# Patient Record
Sex: Female | Born: 1945 | Race: White | Hispanic: No | Marital: Married | Smoking: Former smoker
Health system: Southern US, Community
[De-identification: ages and names within clinical notes are randomized; demographics above are authoritative.]

## PROBLEM LIST (undated history)

## (undated) DIAGNOSIS — M1120 Other chondrocalcinosis, unspecified site: Secondary | ICD-10-CM

## (undated) DIAGNOSIS — R200 Anesthesia of skin: Secondary | ICD-10-CM

## (undated) DIAGNOSIS — E871 Hypo-osmolality and hyponatremia: Secondary | ICD-10-CM

## (undated) DIAGNOSIS — M1990 Unspecified osteoarthritis, unspecified site: Secondary | ICD-10-CM

## (undated) DIAGNOSIS — E785 Hyperlipidemia, unspecified: Secondary | ICD-10-CM

## (undated) DIAGNOSIS — G4730 Sleep apnea, unspecified: Secondary | ICD-10-CM

## (undated) DIAGNOSIS — D6869 Other thrombophilia: Secondary | ICD-10-CM

## (undated) DIAGNOSIS — K219 Gastro-esophageal reflux disease without esophagitis: Secondary | ICD-10-CM

## (undated) DIAGNOSIS — I10 Essential (primary) hypertension: Secondary | ICD-10-CM

## (undated) DIAGNOSIS — E039 Hypothyroidism, unspecified: Secondary | ICD-10-CM

## (undated) DIAGNOSIS — J45909 Unspecified asthma, uncomplicated: Secondary | ICD-10-CM

## (undated) DIAGNOSIS — N189 Chronic kidney disease, unspecified: Secondary | ICD-10-CM

## (undated) DIAGNOSIS — K5790 Diverticulosis of intestine, part unspecified, without perforation or abscess without bleeding: Secondary | ICD-10-CM

## (undated) DIAGNOSIS — E669 Obesity, unspecified: Secondary | ICD-10-CM

## (undated) DIAGNOSIS — D689 Coagulation defect, unspecified: Secondary | ICD-10-CM

## (undated) DIAGNOSIS — G4733 Obstructive sleep apnea (adult) (pediatric): Secondary | ICD-10-CM

## (undated) DIAGNOSIS — I872 Venous insufficiency (chronic) (peripheral): Secondary | ICD-10-CM

## (undated) DIAGNOSIS — I728 Aneurysm of other specified arteries: Secondary | ICD-10-CM

## (undated) DIAGNOSIS — I2699 Other pulmonary embolism without acute cor pulmonale: Secondary | ICD-10-CM

## (undated) DIAGNOSIS — I480 Paroxysmal atrial fibrillation: Secondary | ICD-10-CM

## (undated) HISTORY — DX: Obstructive sleep apnea (adult) (pediatric): G47.33

## (undated) HISTORY — DX: Venous insufficiency (chronic) (peripheral): I87.2

## (undated) HISTORY — DX: Gastro-esophageal reflux disease without esophagitis: K21.9

## (undated) HISTORY — DX: Hyperlipidemia, unspecified: E78.5

## (undated) HISTORY — DX: Other pulmonary embolism without acute cor pulmonale: I26.99

## (undated) HISTORY — PX: TONSILLECTOMY: SUR1361

## (undated) HISTORY — PX: KNEE ARTHROSCOPY: SUR90

## (undated) HISTORY — DX: Unspecified asthma, uncomplicated: J45.909

## (undated) HISTORY — PX: BREAST RECONSTRUCTION: SHX9

## (undated) HISTORY — DX: Coagulation defect, unspecified: D68.9

## (undated) HISTORY — DX: Aneurysm of other specified arteries: I72.8

## (undated) HISTORY — DX: Diverticulosis of intestine, part unspecified, without perforation or abscess without bleeding: K57.90

## (undated) HISTORY — DX: Obesity, unspecified: E66.9

## (undated) HISTORY — DX: Other thrombophilia: D68.69

## (undated) HISTORY — DX: Chronic kidney disease, unspecified: N18.9

## (undated) HISTORY — PX: WISDOM TOOTH EXTRACTION: SHX21

## (undated) HISTORY — DX: Hypo-osmolality and hyponatremia: E87.1

## (undated) HISTORY — DX: Hypothyroidism, unspecified: E03.9

## (undated) HISTORY — DX: Paroxysmal atrial fibrillation: I48.0

---

## 1985-10-26 HISTORY — PX: OTHER SURGICAL HISTORY: SHX169

## 1998-04-16 ENCOUNTER — Other Ambulatory Visit: Admission: RE | Admit: 1998-04-16 | Discharge: 1998-04-16 | Payer: Self-pay | Admitting: *Deleted

## 1999-06-12 ENCOUNTER — Other Ambulatory Visit: Admission: RE | Admit: 1999-06-12 | Discharge: 1999-06-12 | Payer: Self-pay | Admitting: *Deleted

## 1999-12-08 ENCOUNTER — Ambulatory Visit (HOSPITAL_COMMUNITY): Admission: RE | Admit: 1999-12-08 | Discharge: 1999-12-08 | Payer: Self-pay | Admitting: Orthopedic Surgery

## 2000-07-01 ENCOUNTER — Other Ambulatory Visit: Admission: RE | Admit: 2000-07-01 | Discharge: 2000-07-01 | Payer: Self-pay | Admitting: *Deleted

## 2001-08-22 ENCOUNTER — Other Ambulatory Visit: Admission: RE | Admit: 2001-08-22 | Discharge: 2001-08-22 | Payer: Self-pay | Admitting: *Deleted

## 2002-06-08 ENCOUNTER — Encounter: Admission: RE | Admit: 2002-06-08 | Discharge: 2002-06-08 | Payer: Self-pay | Admitting: Dermatology

## 2002-06-08 ENCOUNTER — Encounter: Payer: Self-pay | Admitting: Dermatology

## 2002-07-26 ENCOUNTER — Encounter: Admission: RE | Admit: 2002-07-26 | Discharge: 2002-07-26 | Payer: Self-pay | Admitting: Dermatology

## 2002-07-26 ENCOUNTER — Encounter: Payer: Self-pay | Admitting: Gastroenterology

## 2002-08-16 ENCOUNTER — Other Ambulatory Visit: Admission: RE | Admit: 2002-08-16 | Discharge: 2002-08-16 | Payer: Self-pay | Admitting: *Deleted

## 2002-10-26 HISTORY — PX: COLONOSCOPY: SHX174

## 2002-11-07 ENCOUNTER — Ambulatory Visit (HOSPITAL_BASED_OUTPATIENT_CLINIC_OR_DEPARTMENT_OTHER): Admission: RE | Admit: 2002-11-07 | Discharge: 2002-11-07 | Payer: Self-pay | Admitting: Gastroenterology

## 2003-10-03 ENCOUNTER — Other Ambulatory Visit: Admission: RE | Admit: 2003-10-03 | Discharge: 2003-10-03 | Payer: Self-pay | Admitting: *Deleted

## 2004-10-07 ENCOUNTER — Other Ambulatory Visit: Admission: RE | Admit: 2004-10-07 | Discharge: 2004-10-07 | Payer: Self-pay | Admitting: *Deleted

## 2005-02-05 ENCOUNTER — Ambulatory Visit: Payer: Self-pay | Admitting: Internal Medicine

## 2005-11-17 ENCOUNTER — Other Ambulatory Visit: Admission: RE | Admit: 2005-11-17 | Discharge: 2005-11-17 | Payer: Self-pay | Admitting: *Deleted

## 2006-11-18 ENCOUNTER — Other Ambulatory Visit: Admission: RE | Admit: 2006-11-18 | Discharge: 2006-11-18 | Payer: Self-pay | Admitting: *Deleted

## 2007-02-24 ENCOUNTER — Encounter: Admission: RE | Admit: 2007-02-24 | Discharge: 2007-02-24 | Payer: Self-pay | Admitting: Gastroenterology

## 2008-03-06 ENCOUNTER — Other Ambulatory Visit: Admission: RE | Admit: 2008-03-06 | Discharge: 2008-03-06 | Payer: Self-pay | Admitting: Obstetrics and Gynecology

## 2009-06-27 ENCOUNTER — Encounter: Payer: Self-pay | Admitting: Obstetrics and Gynecology

## 2009-06-27 ENCOUNTER — Ambulatory Visit: Payer: Self-pay | Admitting: Obstetrics and Gynecology

## 2009-06-27 ENCOUNTER — Other Ambulatory Visit: Admission: RE | Admit: 2009-06-27 | Discharge: 2009-06-27 | Payer: Self-pay | Admitting: Obstetrics and Gynecology

## 2009-09-23 ENCOUNTER — Ambulatory Visit: Payer: Self-pay | Admitting: Obstetrics and Gynecology

## 2009-12-26 ENCOUNTER — Ambulatory Visit: Payer: Self-pay | Admitting: Obstetrics and Gynecology

## 2011-07-06 ENCOUNTER — Other Ambulatory Visit: Payer: Self-pay | Admitting: Internal Medicine

## 2011-07-06 DIAGNOSIS — R51 Headache: Secondary | ICD-10-CM

## 2011-07-07 ENCOUNTER — Other Ambulatory Visit: Payer: Self-pay

## 2011-07-09 ENCOUNTER — Ambulatory Visit
Admission: RE | Admit: 2011-07-09 | Discharge: 2011-07-09 | Disposition: A | Discharge status: Home / Self Care | Payer: Medicare Other | Source: Ambulatory Visit | Attending: Internal Medicine | Admitting: Internal Medicine

## 2011-07-09 DIAGNOSIS — R51 Headache: Secondary | ICD-10-CM

## 2011-07-09 MED ORDER — GADOBENATE DIMEGLUMINE 529 MG/ML IV SOLN
20.0000 mL | Freq: Once | INTRAVENOUS | Status: AC | PRN
  Administered 2011-07-09: 20 mL via INTRAVENOUS

## 2011-07-13 ENCOUNTER — Other Ambulatory Visit: Payer: Self-pay | Admitting: Internal Medicine

## 2011-07-13 ENCOUNTER — Other Ambulatory Visit: Payer: Medicare Other

## 2011-07-13 DIAGNOSIS — R51 Headache: Secondary | ICD-10-CM

## 2011-07-14 ENCOUNTER — Encounter (HOSPITAL_COMMUNITY): Payer: Self-pay

## 2011-07-14 ENCOUNTER — Ambulatory Visit (HOSPITAL_COMMUNITY)
Admission: RE | Admit: 2011-07-14 | Discharge: 2011-07-14 | Disposition: A | Discharge status: Home / Self Care | Payer: Medicare Other | Source: Ambulatory Visit | Attending: Internal Medicine | Admitting: Internal Medicine

## 2011-07-14 DIAGNOSIS — I6529 Occlusion and stenosis of unspecified carotid artery: Secondary | ICD-10-CM | POA: Insufficient documentation

## 2011-07-14 DIAGNOSIS — I729 Aneurysm of unspecified site: Secondary | ICD-10-CM | POA: Insufficient documentation

## 2011-07-14 DIAGNOSIS — R9389 Abnormal findings on diagnostic imaging of other specified body structures: Secondary | ICD-10-CM | POA: Insufficient documentation

## 2011-07-14 DIAGNOSIS — R51 Headache: Secondary | ICD-10-CM | POA: Insufficient documentation

## 2011-07-14 HISTORY — DX: Essential (primary) hypertension: I10

## 2011-07-14 MED ORDER — IOHEXOL 350 MG/ML SOLN: 50.0000 mL | Freq: Once | INTRAVENOUS | Status: AC | PRN

## 2013-06-26 DIAGNOSIS — I2699 Other pulmonary embolism without acute cor pulmonale: Secondary | ICD-10-CM

## 2013-06-26 HISTORY — DX: Other pulmonary embolism without acute cor pulmonale: I26.99

## 2013-07-14 ENCOUNTER — Inpatient Hospital Stay (HOSPITAL_COMMUNITY): Payer: Medicare Other

## 2013-07-14 ENCOUNTER — Inpatient Hospital Stay (HOSPITAL_COMMUNITY)
Admission: EM | Admit: 2013-07-14 | Discharge: 2013-07-17 | DRG: 176 | Disposition: A | Discharge status: Home / Self Care | Payer: Medicare Other | Attending: Internal Medicine | Admitting: Internal Medicine

## 2013-07-14 ENCOUNTER — Encounter (HOSPITAL_COMMUNITY): Payer: Self-pay

## 2013-07-14 ENCOUNTER — Emergency Department (HOSPITAL_COMMUNITY): Payer: Medicare Other

## 2013-07-14 DIAGNOSIS — G4730 Sleep apnea, unspecified: Secondary | ICD-10-CM | POA: Diagnosis present

## 2013-07-14 DIAGNOSIS — I82409 Acute embolism and thrombosis of unspecified deep veins of unspecified lower extremity: Secondary | ICD-10-CM | POA: Diagnosis present

## 2013-07-14 DIAGNOSIS — N390 Urinary tract infection, site not specified: Secondary | ICD-10-CM | POA: Diagnosis present

## 2013-07-14 DIAGNOSIS — M1990 Unspecified osteoarthritis, unspecified site: Secondary | ICD-10-CM | POA: Diagnosis present

## 2013-07-14 DIAGNOSIS — I10 Essential (primary) hypertension: Secondary | ICD-10-CM | POA: Diagnosis present

## 2013-07-14 DIAGNOSIS — R509 Fever, unspecified: Secondary | ICD-10-CM

## 2013-07-14 DIAGNOSIS — R0602 Shortness of breath: Secondary | ICD-10-CM | POA: Diagnosis present

## 2013-07-14 DIAGNOSIS — I2699 Other pulmonary embolism without acute cor pulmonale: Principal | ICD-10-CM | POA: Diagnosis present

## 2013-07-14 DIAGNOSIS — E669 Obesity, unspecified: Secondary | ICD-10-CM | POA: Diagnosis present

## 2013-07-14 DIAGNOSIS — I4819 Other persistent atrial fibrillation: Secondary | ICD-10-CM | POA: Diagnosis present

## 2013-07-14 DIAGNOSIS — I4891 Unspecified atrial fibrillation: Secondary | ICD-10-CM

## 2013-07-14 DIAGNOSIS — E876 Hypokalemia: Secondary | ICD-10-CM | POA: Diagnosis present

## 2013-07-14 DIAGNOSIS — E860 Dehydration: Secondary | ICD-10-CM | POA: Diagnosis present

## 2013-07-14 DIAGNOSIS — Z22322 Carrier or suspected carrier of Methicillin resistant Staphylococcus aureus: Secondary | ICD-10-CM

## 2013-07-14 DIAGNOSIS — Z6838 Body mass index (BMI) 38.0-38.9, adult: Secondary | ICD-10-CM

## 2013-07-14 DIAGNOSIS — M216X9 Other acquired deformities of unspecified foot: Secondary | ICD-10-CM | POA: Diagnosis present

## 2013-07-14 DIAGNOSIS — M25569 Pain in unspecified knee: Secondary | ICD-10-CM

## 2013-07-14 DIAGNOSIS — M25561 Pain in right knee: Secondary | ICD-10-CM | POA: Diagnosis present

## 2013-07-14 LAB — COMPREHENSIVE METABOLIC PANEL
Albumin: 3.2 g/dL — ABNORMAL LOW (ref 3.5–5.2)
Alkaline Phosphatase: 73 U/L (ref 39–117)
BUN: 32 mg/dL — ABNORMAL HIGH (ref 6–23)
CO2: 21 mEq/L (ref 19–32)
Chloride: 87 mEq/L — ABNORMAL LOW (ref 96–112)
Creatinine, Ser: 1.04 mg/dL (ref 0.50–1.10)
GFR calc Af Amer: 63 mL/min — ABNORMAL LOW (ref >90)
GFR calc non Af Amer: 54 mL/min — ABNORMAL LOW (ref >90)
Glucose, Bld: 103 mg/dL — ABNORMAL HIGH (ref 70–99)
Potassium: 4.9 mEq/L (ref 3.5–5.1)
Total Bilirubin: 1.1 mg/dL (ref 0.3–1.2)

## 2013-07-14 LAB — MRSA PCR SCREENING: MRSA by PCR: POSITIVE — AB

## 2013-07-14 LAB — SYNOVIAL CELL COUNT + DIFF, W/ CRYSTALS
Eosinophils-Synovial: 0 % (ref 0–1)
Monocyte-Macrophage-Synovial Fluid: 12 % — ABNORMAL LOW (ref 50–90)
Neutrophil, Synovial: 84 % — ABNORMAL HIGH (ref 0–25)
Other Cells-SYN: 0
WBC, Synovial: 16650 /mm3 — ABNORMAL HIGH (ref 0–200)

## 2013-07-14 LAB — GRAM STAIN

## 2013-07-14 LAB — URINALYSIS, ROUTINE W REFLEX MICROSCOPIC
Glucose, UA: NEGATIVE mg/dL
Ketones, ur: NEGATIVE mg/dL
Protein, ur: NEGATIVE mg/dL
Urobilinogen, UA: 1 mg/dL (ref 0.0–1.0)

## 2013-07-14 LAB — PRO B NATRIURETIC PEPTIDE: Pro B Natriuretic peptide (BNP): 974.9 pg/mL — ABNORMAL HIGH (ref 0–125)

## 2013-07-14 LAB — D-DIMER, QUANTITATIVE: D-Dimer, Quant: 3.77 ug/mL-FEU — ABNORMAL HIGH (ref 0.00–0.48)

## 2013-07-14 LAB — POCT I-STAT, CHEM 8
BUN: 46 mg/dL — ABNORMAL HIGH (ref 6–23)
Calcium, Ion: 1 mmol/L — ABNORMAL LOW (ref 1.13–1.30)
Creatinine, Ser: 1.3 mg/dL — ABNORMAL HIGH (ref 0.50–1.10)
Hemoglobin: 12.9 g/dL (ref 12.0–15.0)
TCO2: 25 mmol/L (ref 0–100)

## 2013-07-14 LAB — POCT I-STAT TROPONIN I
Troponin i, poc: 0 ng/mL (ref 0.00–0.08)
Troponin i, poc: 0.01 ng/mL (ref 0.00–0.08)

## 2013-07-14 LAB — URINE MICROSCOPIC-ADD ON

## 2013-07-14 LAB — CBC
HCT: 35.3 % — ABNORMAL LOW (ref 36.0–46.0)
Hemoglobin: 13 g/dL (ref 12.0–15.0)
MCV: 90.3 fL (ref 78.0–100.0)
RBC: 3.91 MIL/uL (ref 3.87–5.11)
WBC: 14.8 10*3/uL — ABNORMAL HIGH (ref 4.0–10.5)

## 2013-07-14 MED ORDER — INFLUENZA VAC SPLIT QUAD 0.5 ML IM SUSP
0.5000 mL | INTRAMUSCULAR | Status: AC
  Administered 2013-07-15: 0.5 mL via INTRAMUSCULAR
  Filled 2013-07-14: qty 0.5

## 2013-07-14 MED ORDER — ACETAMINOPHEN 325 MG PO TABS: 650.0000 mg | ORAL_TABLET | Freq: Four times a day (QID) | ORAL | Status: DC | PRN

## 2013-07-14 MED ORDER — PIPERACILLIN-TAZOBACTAM 3.375 G IVPB
3.3750 g | Freq: Three times a day (TID) | INTRAVENOUS | Status: DC
  Administered 2013-07-14 – 2013-07-15 (×2): 3.375 g via INTRAVENOUS
  Filled 2013-07-14 (×5): qty 50

## 2013-07-14 MED ORDER — LIDOCAINE HCL (PF) 2 % IJ SOLN: 2.0000 mL | Freq: Once | INTRAMUSCULAR | Status: DC

## 2013-07-14 MED ORDER — SODIUM CHLORIDE 0.9 % IV BOLUS (SEPSIS)
1000.0000 mL | Freq: Once | INTRAVENOUS | Status: AC
  Administered 2013-07-14: 1000 mL via INTRAVENOUS

## 2013-07-14 MED ORDER — ACETAMINOPHEN 650 MG RE SUPP: 650.0000 mg | Freq: Four times a day (QID) | RECTAL | Status: DC | PRN

## 2013-07-14 MED ORDER — CLONIDINE HCL 0.3 MG PO TABS
0.3000 mg | ORAL_TABLET | Freq: Two times a day (BID) | ORAL | Status: DC
  Administered 2013-07-14 – 2013-07-17 (×6): 0.3 mg via ORAL
  Filled 2013-07-14 (×7): qty 1

## 2013-07-14 MED ORDER — HEPARIN (PORCINE) IN NACL 100-0.45 UNIT/ML-% IJ SOLN
1500.0000 [IU]/h | INTRAMUSCULAR | Status: DC
  Administered 2013-07-14: 1200 [IU]/h via INTRAVENOUS
  Filled 2013-07-14 (×2): qty 250

## 2013-07-14 MED ORDER — ONDANSETRON HCL 4 MG PO TABS: 4.0000 mg | ORAL_TABLET | Freq: Four times a day (QID) | ORAL | Status: DC | PRN

## 2013-07-14 MED ORDER — OXYCODONE HCL 5 MG PO TABS
5.0000 mg | ORAL_TABLET | ORAL | Status: DC | PRN
  Administered 2013-07-14 – 2013-07-17 (×5): 5 mg via ORAL
  Filled 2013-07-14 (×5): qty 1

## 2013-07-14 MED ORDER — LEVOTHYROXINE SODIUM 125 MCG PO TABS
125.0000 ug | ORAL_TABLET | Freq: Every day | ORAL | Status: DC
  Administered 2013-07-15 – 2013-07-17 (×3): 125 ug via ORAL
  Filled 2013-07-14 (×4): qty 1

## 2013-07-14 MED ORDER — HYDROCHLOROTHIAZIDE 12.5 MG PO CAPS
12.5000 mg | ORAL_CAPSULE | Freq: Every day | ORAL | Status: DC
  Administered 2013-07-15: 12.5 mg via ORAL
  Filled 2013-07-14: qty 1

## 2013-07-14 MED ORDER — HEPARIN BOLUS VIA INFUSION
3000.0000 [IU] | Freq: Once | INTRAVENOUS | Status: AC
  Administered 2013-07-14: 3000 [IU] via INTRAVENOUS
  Filled 2013-07-14: qty 3000

## 2013-07-14 MED ORDER — IOHEXOL 350 MG/ML SOLN
100.0000 mL | Freq: Once | INTRAVENOUS | Status: AC | PRN
  Administered 2013-07-14: 100 mL via INTRAVENOUS

## 2013-07-14 MED ORDER — DILTIAZEM HCL 100 MG IV SOLR
5.0000 mg/h | INTRAVENOUS | Status: DC
  Administered 2013-07-14: 5 mg/h via INTRAVENOUS
  Filled 2013-07-14: qty 100

## 2013-07-14 MED ORDER — ALUM & MAG HYDROXIDE-SIMETH 200-200-20 MG/5ML PO SUSP: 30.0000 mL | Freq: Four times a day (QID) | ORAL | Status: DC | PRN

## 2013-07-14 MED ORDER — VANCOMYCIN HCL IN DEXTROSE 750-5 MG/150ML-% IV SOLN
750.0000 mg | Freq: Two times a day (BID) | INTRAVENOUS | Status: DC
  Administered 2013-07-14 – 2013-07-15 (×2): 750 mg via INTRAVENOUS
  Filled 2013-07-14 (×3): qty 150

## 2013-07-14 MED ORDER — DILTIAZEM HCL 100 MG IV SOLR
5.0000 mg/h | Freq: Once | INTRAVENOUS | Status: AC
  Administered 2013-07-14: 5 mg/h via INTRAVENOUS

## 2013-07-14 MED ORDER — SODIUM CHLORIDE 0.9 % IJ SOLN
3.0000 mL | Freq: Two times a day (BID) | INTRAMUSCULAR | Status: DC
  Administered 2013-07-15 – 2013-07-17 (×5): 3 mL via INTRAVENOUS

## 2013-07-14 MED ORDER — MORPHINE SULFATE 2 MG/ML IJ SOLN: 1.0000 mg | INTRAMUSCULAR | Status: DC | PRN

## 2013-07-14 MED ORDER — SODIUM CHLORIDE 0.9 % IV SOLN
INTRAVENOUS | Status: DC
  Administered 2013-07-14 – 2013-07-16 (×2): via INTRAVENOUS

## 2013-07-14 MED ORDER — ONDANSETRON HCL 4 MG/2ML IJ SOLN: 4.0000 mg | Freq: Four times a day (QID) | INTRAMUSCULAR | Status: DC | PRN

## 2013-07-14 NOTE — Consult Note (Addendum)
Admit date: 07/14/2013 Referring Physician  ER MD Primary Physician  Clent Ridges, MD, Deboraha Sprang at [facility] Cardiologist  Armanda Magic, MD Reason for Consultation  A fib with RVR  ASSESSMENT: 1. New onset Atrial fibrillation with RVR (prior w/u 2012 by Dr. Mayford Knife for palpitations was negative) 2. Acute onset dyspnea, fever and chills. R/O PE and infection 3. Obesity 4. Sleep apnea 5. Hypertension 6. Osteoarthritis  PLAN:  1. IV diltiazem as you are doing 2. 2 D doppler echo 3. Agree with IV heparin for A fib and possibility of PE   HPI: The patient is admitted with fever, chills, and A fib with RVR. Started feeling poorly 3-4 days ago. Associated with lower extremity swelling. No chest pain and no prior history of heart condition. Did have a negative w/u for palpitations in 2012 by Dr. Kym Groom.   PMH:   Past Medical History  Diagnosis Date  . Hypertension      PSH:  History reviewed. No pertinent past surgical history.  Allergies:  Nsaids and Codeine Prior to Admit Meds:   Prescriptions prior to admission  Medication Sig Dispense Refill  . acetaminophen (TYLENOL) 500 MG tablet Take 1,000 mg by mouth 2 (two) times daily as needed for pain.       . cloNIDine (CATAPRES) 0.3 MG tablet Take 0.3 mg by mouth 2 (two) times daily.      . enalapril (VASOTEC) 20 MG tablet Take 20 mg by mouth daily.      . hydrochlorothiazide (MICROZIDE) 12.5 MG capsule Take 12.5 mg by mouth daily.      [name] HYDROcodone-acetaminophen (NORCO/VICODIN) 5-325 MG per tablet Take 1 tablet by mouth every 6 (six) hours as needed for pain.      [name] levothyroxine (SYNTHROID, LEVOTHROID) 125 MCG tablet Take 125 mcg by mouth daily before breakfast.      . Nutritional Supplements (JUICE PLUS FIBRE PO) Take 3 tablets by mouth daily. Takes 1 tablet of fruits, vegetables, and berry daily.      [name] OVER THE COUNTER MEDICATION Apply 1 application topically daily as needed ("essential oils" dark blue label-for pain).       [name]  OVER THE COUNTER MEDICATION Take 3 tablets by mouth daily. "Doterra OnGuard capsules" herbal medication      . tetrahydrozoline 0.05 % ophthalmic solution Place 2 drops into both eyes daily as needed (for dry eyes).      . traMADol (ULTRAM) 50 MG tablet Take 50 mg by mouth every 6 (six) hours as needed for pain.       Fam HX:   History reviewed. No pertinent family history. Social HX:    History   Social History  . Marital Status: Married    Spouse Name: N/A    Number of Children: N/A  . Years of Education: N/A   Occupational History  . Not on file.   Social History Main Topics  . Smoking status: Not on file  . Smokeless tobacco: Not on file  . Alcohol Use: Not on file  . Drug Use: Not on file  . Sexual Activity: Not on file   Other Topics Concern  . Not on file   Social History Narrative  . No narrative on file     Review of Systems: No new complaints other than knee pain  Physical Exam: Blood pressure 103/49, pulse 99, temperature 98.5 F (36.9 C), temperature source Oral, resp. rate 22, SpO2 100.00%. Weight change:    Chest is clear Cardiac  exam reveals an S4 gallop.(she is back is rhythm) No edema Neuro is unremarkable. Labs:   Lab Results  Component Value Date   WBC 14.8* 07/14/2013   HGB 12.9 07/14/2013   HCT 38.0 07/14/2013   MCV 90.3 07/14/2013   PLT 268 07/14/2013    Recent Labs Lab 07/14/13 1425 07/14/13 1445  NA 126* 130*  K 4.9 4.9  CL 87* 97  CO2 21  --   BUN 32* 46*  CREATININE 1.04 1.30*  CALCIUM 9.5  --   PROT 8.0  --   BILITOT 1.1  --   ALKPHOS 73  --   ALT 21  --   AST 48*  --   GLUCOSE 103* 110*     Radiology:  Dg Chest Portable 1 View  07/14/2013   CLINICAL DATA:  Shortness of Breath. Hypertension. Atrial fibrillation.  EXAM: PORTABLE CHEST - 1 VIEW  COMPARISON:  None.  FINDINGS: Cardiomegaly.  Pulmonary vascular congestion.  No segmental infiltrate or gross pneumothorax.  Calcified aorta.  The patient would eventually benefit  from two view chest.  IMPRESSION: Cardiomegaly.  Pulmonary vascular congestion.   Electronically Signed   By: Bridgett Larsson   On: 07/14/2013 14:58   EKG:   Atrial fibrillation with RVR and non diagnostic ST abnormality on admission  ECHOCARDIOGRAM 2012: Findings: Left Ventricle: Normal LV size and function. Moderate concentric left ventricular hypertrophy. There were no regional wall motion abnormalities. Left ventricular ejection fraction estimated by 2D at 73.9% percent Right Ventricle: Normal right ventricular size and function. Left Atrium: Mild left atrial enlargement. Right Atrium: Normal right atrial size. Mitral Valve: Moderate mitral annular calcification. Mitral valve is mildly sclerotic. Mild mitral valve regurgitation. Tricuspid Valve: Normal tricuspid valve structure and function. Trivial tricuspid regurgitation. Aortic Valve: The aortic valve is sclerotic but opens well. Mild calcification of the aortic valve. Pulmonic Valve: Normal pulmonic valve structure and function. Aorta: The aortic root is normal. Pericardium: Normal pericardium. Diastolic Function: Analysis of mitral valve inflow, pulmonary vein Doppler and tissue Doppler suggests grade I diastolic dysfunction without elevated left atrial pressure. Other: Inferior vena cava demonstrates a >50% collapse with respiration. Digitally signed Armanda Magic, M.D. 11/17/10 5:31 PM Study Data 2D Measurements Mitral Valve Mitral VTI: 24.5 cm Peak E: 0.74 (0.6-1.3) m/s MV Mean Vel: 0.89 m/s Peak A: 1.1 (<=7) m/s E/A Ratio: 0.7 (.75-1.5) e' lateral: 7.02 (>=10) cm/s e' Medial: 6.04 (>=10) cm/s E/e' Lateral: 10.5 (<=8) E/e' Medial: 12.3 (<=8) PHT: 101 ms MVA by PHT: 2.18 (4-6) cm2 DS: 255 cm/s2 DT: 405 (<=200) ms Tricuspid Valve TR Peak Vel: 2.28 m/s TR Peak Grad: 21 mmHg Aortic Valve Peak: 1.58 (<=2.5) m/s Peak Grad: 10 (<=16) mmHg Mean: 1.18 m/s Mean Grad: 6 (<=5) mmHg LVOT PV: 1.15 (0.7-1.1) m/s LVOT PG: 5  mmHg Pulmonic Valve RVIDd: 3.53 (1.9-2.6) cm LVIDd: 4.35 (4.2-5.9) cm LVIDs: 2.5 (2.1-4.0) cm IVSDd: 1.77 (0.6-1.0) cm LVPWd: 1.62 (0.6-1.0) cm Aortic Root: 3.6 (2.0-3.7) cm LA/AR Ratio: 1.25 EF: 73.9 (>=55) % FS: 42.5 (25-43) % SV: 63.1 (70-100) ml EDV: 85.4 (67-155) ml ESV: 22.3 (22-58) ml LA: 4.5 (3.0-4.0) cm M-Mode Measurements Report for Elizabeth Wang echo on 11/17/10   Elizabeth Wang, Elizabeth Wang 07/14/2013 7:14 PM

## 2013-07-14 NOTE — ED Notes (Signed)
EMS initially called for bilateral knee pain and swelling, pt found to be in A Fib rate from 180-200, after given - 10 mg boluses of cardiazem and drip initiated at 5 hr on arrival to ed in 140-150's, SOB resolved when rate dropped. Denies Cardiac Hx. Rales noted

## 2013-07-14 NOTE — ED Provider Notes (Signed)
Medical screening examination/treatment/procedure(s) were conducted as a shared visit with non-physician practitioner(s) and myself.  I personally evaluated the patient during the encounter  67 yo who presents with acute SOB.  Afib with RVR with rate in 140s.  Patient given fluids and started on dilt drip with improvement of rate to 80-110 and improvement of symptoms.  Patient also reports fever to 102 at home and immobilization for 3-4 days 2/2 bilateral knee pain. Exam notable for coarse bilateral breath sounds.  Full ROM of bilateral knees and large effusions.  Screening labs, cxray and arthocentesis to evaluate for infection given increased WBC and fever.  Cardiology evaluation for new onset atrial fibrillation and SOB.  Patient's recent immobility also puts her at risk for PE.  D-dimer sent given well's score of 1.5 (for immobility).  Tachycardia was not included given that it has been controlled with a dilt drip.  Patient to be admitted by hospitalist for full evaluation.  Diagnosis: New onset afib with RVR SOB Fever Knee pain   EKG:  afib with RVR, no evidence of acute ischemia  CRITICAL CARE Performed by: Ross Marcus, F   Total critical care time: 35 min  Critical care time was exclusive of separately billable procedures and treating other patients.  Critical care was necessary to treat or prevent imminent or life-threatening deterioration.  Critical care was time spent personally by me on the following activities: development of treatment plan with patient and/or surrogate as well as nursing, discussions with consultants, evaluation of patient's response to treatment, examination of patient, obtaining history from patient or surrogate, ordering and performing treatments and interventions, ordering and review of laboratory studies, ordering and review of radiographic studies, pulse oximetry and re-evaluation of patient's condition.  Shon Baton, MD 07/14/13 [phone]

## 2013-07-14 NOTE — ED Provider Notes (Signed)
CSN: [id number]     Arrival date & time 07/14/13  1408 History   None    Chief Complaint  Patient presents with  . Atrial Fibrillation  . Shortness of Breath   (Consider location/radiation/quality/duration/timing/severity/associated sxs/prior Treatment) HPI Comments: 67 yo female with hx of HTN and severe bilateral knee arthritis that presents with c/o acute onset of dyspnea starting this morning around 10am accompanied by 4 day hx of bilateral knee pain and swelling, and fever, TMAX 102 2 days ago, ranging 98.7-100.4 today at home. Reports that she noted severe knee pain 4 days ago after a busy weekend. Unable to get out of the bed for past 4 days due to severe pain to both knees, left worse than right, despite use of norco. Endorses knees both swollen, warm, and erythematous. This has improved today, though left knee pain is persistently severe. Denies chest pain, N/V/D, abdominal pain, dysuria. No report of cardiac hx.  The history is provided by the patient.    Past Medical History  Diagnosis Date  . Hypertension    History reviewed. No pertinent past surgical history. History reviewed. No pertinent family history. History  Substance Use Topics  . Smoking status: Not on file  . Smokeless tobacco: Not on file  . Alcohol Use: Not on file   OB History   Grav Para Term Preterm Abortions TAB SAB Ect Mult Living                 Review of Systems  Constitutional: Negative for fever and chills.  HENT: Negative for sore throat, rhinorrhea and neck pain.   Eyes: Negative for visual disturbance.  Respiratory: Positive for shortness of breath. Negative for cough.   Cardiovascular: Positive for palpitations and leg swelling. Negative for chest pain.  Gastrointestinal: Negative for nausea, vomiting and abdominal pain.  Genitourinary: Negative for dysuria.  Musculoskeletal: Positive for joint swelling and gait problem.  Skin: Positive for color change. Negative for rash.  Neurological:  Negative for dizziness and numbness.  Hematological: Negative for adenopathy.  Psychiatric/Behavioral: Negative for agitation.    Allergies  Review of patient's allergies indicates not on file.  Home Medications  No current outpatient prescriptions on file.  Physical Exam  Nursing note and vitals reviewed. Constitutional: She is oriented to person, place, and time. She appears well-developed and well-nourished.  HENT:  Head: Normocephalic and atraumatic.  Right Ear: External ear normal.  Left Ear: External ear normal.  Nose: Nose normal.  Mouth/Throat: Oropharynx is clear and moist.  Eyes: EOM are normal.  Neck: Normal range of motion. Neck supple.  Cardiovascular: Normal rate, regular rhythm, normal heart sounds and intact distal pulses.   Pulmonary/Chest: Effort normal. She has rales ( few bibasilar rales).  Abdominal: Soft. Bowel sounds are normal. There is no tenderness.  Musculoskeletal:       Right knee: She exhibits decreased range of motion, swelling and effusion ( large). She exhibits no erythema. Tenderness ( diffuse) found.       Left knee: She exhibits decreased range of motion, swelling and effusion ( moderate sized, with warmth). She exhibits no erythema and no bony tenderness. Tenderness ( diffusely, superpatellar) found.  Lymphadenopathy:    She has no cervical adenopathy.  Neurological: She is alert and oriented to person, place, and time.  Skin: Skin is warm and dry.  Psychiatric: She has a normal mood and affect.    ED Course  ARTHOCENTESIS Date/Time: 07/14/2013 4:15 PM Performed by: Simmie Davies Authorized by:  Isaias Sakai M Consent: Verbal consent obtained. written consent not obtained. Risks and benefits: risks, benefits and alternatives were discussed Consent given by: patient Patient understanding: patient states understanding of the procedure being performed Patient identity confirmed: verbally with patient Time out: Immediately prior to  procedure a "time out" was called to verify the correct patient, procedure, equipment, support staff and site/side marked as required. Indications: joint swelling, pain and possible septic joint  Body area: knee Joint: right knee Local anesthesia used: yes Anesthesia: local infiltration Local anesthetic: lidocaine 1% without epinephrine Anesthetic total: 2 ml Preparation: Patient was prepped and draped in the usual sterile fashion. Needle gauge: 18 G Approach: lateral Aspirate: clear and yellow Aspirate amount: 60 ml Patient tolerance: Patient tolerated the procedure well with no immediate complications.   (including critical care time) Labs Review Labs Reviewed  CBC - Abnormal; Notable for the following:    WBC 14.8 (*)    HCT 35.3 (*)    MCHC 36.8 (*)    All other components within normal limits  COMPREHENSIVE METABOLIC PANEL - Abnormal; Notable for the following:    Sodium 126 (*)    Chloride 87 (*)    Glucose, Bld 103 (*)    BUN 32 (*)    Albumin 3.2 (*)    AST 48 (*)    GFR calc non Af Amer 54 (*)    GFR calc Af Amer 63 (*)    All other components within normal limits  URINALYSIS, ROUTINE W REFLEX MICROSCOPIC - Abnormal; Notable for the following:    Color, Urine AMBER (*)    APPearance HAZY (*)    Hgb urine dipstick SMALL (*)    Leukocytes, UA MODERATE (*)    All other components within normal limits  SEDIMENTATION RATE - Abnormal; Notable for the following:    Sed Rate 81 (*)    All other components within normal limits  C-REACTIVE PROTEIN - Abnormal; Notable for the following:    CRP 25.2 (*)    All other components within normal limits  D-DIMER, QUANTITATIVE - Abnormal; Notable for the following:    D-Dimer, Quant 3.77 (*)    All other components within normal limits  SYNOVIAL CELL COUNT + DIFF, W/ CRYSTALS - Abnormal; Notable for the following:    Appearance-Synovial CLOUDY (*)    WBC, Synovial 16109 (*)    Neutrophil, Synovial 84 (*)     Monocyte-Macrophage-Synovial Fluid 12 (*)    All other components within normal limits  PRO B NATRIURETIC PEPTIDE - Abnormal; Notable for the following:    Pro B Natriuretic peptide (BNP) 974.9 (*)    All other components within normal limits  URINE MICROSCOPIC-ADD ON - Abnormal; Notable for the following:    Squamous Epithelial / LPF FEW (*)    Bacteria, UA FEW (*)    Casts HYALINE CASTS (*)    All other components within normal limits  POCT I-STAT, CHEM 8 - Abnormal; Notable for the following:    Sodium 130 (*)    BUN 46 (*)    Creatinine, Ser 1.30 (*)    Glucose, Bld 110 (*)    Calcium, Ion 1.00 (*)    All other components within normal limits  GRAM STAIN  BODY FLUID CULTURE  URINE CULTURE  MRSA PCR SCREENING  TROPONIN I  TSH  TROPONIN I  TROPONIN I  BASIC METABOLIC PANEL  CBC  HEPARIN LEVEL (UNFRACTIONATED)  POCT I-STAT TROPONIN I  POCT I-STAT TROPONIN I  Imaging Review Dg Chest Portable 1 View  07/14/2013   CLINICAL DATA:  Shortness of Breath. Hypertension. Atrial fibrillation.  EXAM: PORTABLE CHEST - 1 VIEW  COMPARISON:  None.  FINDINGS: Cardiomegaly.  Pulmonary vascular congestion.  No segmental infiltrate or gross pneumothorax.  Calcified aorta.  The patient would eventually benefit from two view chest.  IMPRESSION: Cardiomegaly.  Pulmonary vascular congestion.   Electronically Signed   By: Bridgett Larsson   On: 07/14/2013 14:58    MDM   1. Atrial fibrillation   2. Fever   3. Knee pain, bilateral   4. SOB (shortness of breath)    Patient presents with atrial fib with RVR and dyspnea and 2 day hx of fever, TMAX 102, bilateral knee pain and swelling and recent immobilization. Initial VS: BP 98/70, HR 135, O2 sat 100% on O2 3 liters via [place] indicating adequate oxygenation. After initial bolus of cardizem and initiation of drip, rate controlled from 80-100. Appears mildly dehydrated clinically with labs revealing low sodium and chloride, and borderline bump in creatinine.  BP borderline hypotensive, remaining stable after hydration with small amount of NS. CXR reveals mild pulmonary congestion, no overt pulmonary edema, no pneumonia. Dyspnea improved after rate control achieved. Consider PE in the setting of new onset atrial fib, though low risk per well's criteria, will send d-dimer. Labs reveal leukocytosis, consider knee infection given recent hx of severe knee pain, arthrocentesis performed.  Arrangements made for admission to the hospitalist service with cardiology consult. Synovial fluid results and d-dimer pending at time of admission.Shared visit with Dr. Wilkie Aye, plan and dispo discussed with Dr. Wilkie Aye.   Simmie Davies, NP 07/14/13 2329

## 2013-07-14 NOTE — Progress Notes (Signed)
MEDICATION RELATED CONSULT NOTE - INITIAL   Pharmacy Consult for Heparin, Vancomycin, Zosyn Indication: AFib, SOB - rule out pneumonia  Allergies  Allergen Reactions  . Nsaids Other (See Comments)    significantly raises BP  . Codeine Nausea Only    Patient Measurements: Height: 5\' 3"  (160 cm) Weight: 215 lb 13.3 oz (97.9 kg) IBW/kg (Calculated) : 52.4 Heparin dosing weight = 77 kg  Labs:  Recent Labs  07/14/13 1425 07/14/13 1445  WBC 14.8*  --   HGB 13.0 12.9  HCT 35.3* 38.0  PLT 268  --   CREATININE 1.04 1.30*  ALBUMIN 3.2*  --   PROT 8.0  --   AST 48*  --   ALT 21  --   ALKPHOS 73  --   BILITOT 1.1  --    Estimated Creatinine Clearance: 46.8 ml/min (by C-G formula based on Cr of 1.3).   Microbiology: Recent Results (from the past 720 hour(s))  GRAM STAIN     Status: None   Collection Time    07/14/13  4:47 PM      Result Value Range Status   Specimen Description SYNOVIAL RIGHT KNEE   Final   Special Requests Normal   Final   Gram Stain     Final   Value: MODERATE WBC PRESENT,BOTH PMN AND MONONUCLEAR     NO ORGANISMS SEEN   Report Status 07/14/2013 FINAL   Final    Medical History: Past Medical History  Diagnosis Date  . Hypertension     Assessment: 67 year old admitted with new onset AFib, acute onset of dyspnea, fever, and chills.  PMH only includes HTN.  Scr = 1.30 with CrCl of 47 ml/min  Goal of Therapy:  Heparin level = 0.3-0.7 Appropriate Zosyn dosing Vancomycin level = 15 to 20 mcg/dl Monitor platelets while on anticoagulation therapy  Plan:  1) Zosyn 3.375 grams iv Q 8 hours - 4 hr infusion 2) Vancomycin 750 mg iv Q 12 hours 3) Heparin 3000 units iv bolus x 1 4) Heparin drip at 1200 units / hr 5) Heparin level and CBC daily  Thank you. Okey Regal, PharmD [phone]   07/14/2013,8:22 PM

## 2013-07-14 NOTE — H&P (Signed)
[facility] History and Physical  Elizabeth Wang UJW:[id number] DOB: [birth year] DOA: 07/14/2013  Referring physician: Simmie Davies, NP PCP: Lamount Cranker with Deboraha Sprang at [facility] Specialists:   Chief Complaint: Fever and shortness of breath  HPI: Elizabeth Wang is a 67 y.o. female with past medical history of hypertension, severe bilateral knee arthritis came in to the hospital with fever and shortness of breath. Patient reported that she was in her usual state of health till till past Monday when she came back from her leg house, she developed bilateral knee pain and soon she developed swelling as well. The pain was so severe, hydrocodone and tramadol was not helping. Patient started to have shortness of breath that comes and goes, not exertional. For the past 2 days also she was getting fever with temp of 102.22 days ago and today is 100.4. In the ED initial evaluation showed positive d-dimer is, low sodium of 126 and a GI fibrillation with rapid ventricular response. Patient started on Cardizem drip, started on heparin drip to treat empirically for PE until she gets her CT angio. Started on broad-spectrum antibiotics for her fever.  Review of Systems:  Constitutional: fevers and sweats Eyes: negative for irritation, redness and visual disturbance Ears, nose, mouth, throat, and face: negative for earaches, epistaxis, nasal congestion and sore throat Respiratory: negative for cough, dyspnea on exertion, sputum and wheezing Cardiovascular: negative for chest pain, dyspnea, lower extremity edema, orthopnea, palpitations and syncope Gastrointestinal: negative for abdominal pain, constipation, diarrhea, melena, nausea and vomiting Genitourinary:negative for dysuria, frequency and hematuria Hematologic/lymphatic: negative for bleeding, easy bruising and lymphadenopathy Musculoskeletal:negative for arthralgias, muscle weakness and stiff joints Neurological: negative for coordination problems,  gait problems, headaches and weakness Endocrine: negative for diabetic symptoms including polydipsia, polyuria and weight loss Allergic/Immunologic: negative for anaphylaxis, hay fever and urticaria   Past Medical History  Diagnosis Date  . Hypertension    History reviewed. No pertinent past surgical history. Social History:  has no tobacco, alcohol, and drug history on file.  Allergies  Allergen Reactions  . Nsaids Other (See Comments)    significantly raises BP  . Codeine Nausea Only    History reviewed. No pertinent family history.  Prior to Admission medications   Medication Sig Start Date End Date Taking? Authorizing Provider  acetaminophen (TYLENOL) 500 MG tablet Take 1,000 mg by mouth 2 (two) times daily as needed for pain.    Yes Historical Provider, MD  cloNIDine (CATAPRES) 0.3 MG tablet Take 0.3 mg by mouth 2 (two) times daily.   Yes Historical Provider, MD  enalapril (VASOTEC) 20 MG tablet Take 20 mg by mouth daily.   Yes Historical Provider, MD  hydrochlorothiazide (MICROZIDE) 12.5 MG capsule Take 12.5 mg by mouth daily.   Yes Historical Provider, MD  HYDROcodone-acetaminophen (NORCO/VICODIN) 5-325 MG per tablet Take 1 tablet by mouth every 6 (six) hours as needed for pain.   Yes Historical Provider, MD  levothyroxine (SYNTHROID, LEVOTHROID) 125 MCG tablet Take 125 mcg by mouth daily before breakfast.   Yes Historical Provider, MD  Nutritional Supplements (JUICE PLUS FIBRE PO) Take 3 tablets by mouth daily. Takes 1 tablet of fruits, vegetables, and berry daily.   Yes Historical Provider, MD  OVER THE COUNTER MEDICATION Apply 1 application topically daily as needed ("essential oils" dark blue label-for pain).    Yes Historical Provider, MD  OVER THE COUNTER MEDICATION Take 3 tablets by mouth daily. "Doterra OnGuard capsules" herbal medication   Yes Historical Provider, MD  tetrahydrozoline 0.05 % ophthalmic solution Place 2 drops into both eyes daily as needed (for dry  eyes).   Yes Historical Provider, MD  traMADol (ULTRAM) 50 MG tablet Take 50 mg by mouth every 6 (six) hours as needed for pain.    Historical Provider, MD   Physical Exam: Filed Vitals:   07/14/13 1715  BP: 103/49  Pulse:   Temp:   Resp: 22  General appearance: alert, cooperative and no distress  Head: Normocephalic, without obvious abnormality, atraumatic  Eyes: conjunctivae/corneas clear. PERRL, EOM's intact. Fundi benign.  Nose: Nares normal. Septum midline. Mucosa normal. No drainage or sinus tenderness.  Throat: lips, mucosa, and tongue normal; teeth and gums normal  Neck: Supple, no masses, no cervical lymphadenopathy, no JVD appreciated, no meningeal signs Resp: clear to auscultation bilaterally  Chest wall: no tenderness  Cardio: regular rate and rhythm, S1, S2 normal, no murmur, click, rub or gallop  GI: soft, non-tender; bowel sounds normal; no masses, no organomegaly  Extremities: extremities normal, atraumatic, no cyanosis or edema  Skin: Skin color, texture, turgor normal. No rashes or lesions  Neurologic: Alert and oriented X 3, normal strength and tone. Normal symmetric reflexes. Normal coordination and gait  Labs on Admission:  Basic Metabolic Panel:  Recent Labs Lab 07/14/13 1425 07/14/13 1445  NA 126* 130*  K 4.9 4.9  CL 87* 97  CO2 21  --   GLUCOSE 103* 110*  BUN 32* 46*  CREATININE 1.04 1.30*  CALCIUM 9.5  --    Liver Function Tests:  Recent Labs Lab 07/14/13 1425  AST 48*  ALT 21  ALKPHOS 73  BILITOT 1.1  PROT 8.0  ALBUMIN 3.2*   No results found for this basename: LIPASE, AMYLASE,  in the last 168 hours No results found for this basename: AMMONIA,  in the last 168 hours CBC:  Recent Labs Lab 07/14/13 1425 07/14/13 1445  WBC 14.8*  --   HGB 13.0 12.9  HCT 35.3* 38.0  MCV 90.3  --   PLT 268  --    Cardiac Enzymes: No results found for this basename: CKTOTAL, CKMB, CKMBINDEX, TROPONINI,  in the last 168 hours  BNP (last 3  results)  Recent Labs  07/14/13 1717  PROBNP 974.9*   CBG: No results found for this basename: GLUCAP,  in the last 168 hours  Radiological Exams on Admission: Dg Chest Portable 1 View  07/14/2013   CLINICAL DATA:  Shortness of Breath. Hypertension. Atrial fibrillation.  EXAM: PORTABLE CHEST - 1 VIEW  COMPARISON:  None.  FINDINGS: Cardiomegaly.  Pulmonary vascular congestion.  No segmental infiltrate or gross pneumothorax.  Calcified aorta.  The patient would eventually benefit from two view chest.  IMPRESSION: Cardiomegaly.  Pulmonary vascular congestion.   Electronically Signed   By: Bridgett Larsson   On: 07/14/2013 14:58    EKG: Independently reviewed.   Assessment/Plan Principal Problem:   Fever Active Problems:   Atrial fibrillation   SOB (shortness of breath)   Knee pain, bilateral   Fever -Unclear etiology, has leukocytosis likely secondary to infection, started on broad-spectrum antibiotics. -Pancultures including blood, urine and right knee synovial fluid culture. -Her urine is suspicious for UTI, patient started on Zosyn and vancomycin.  Elevated D. dimer -With shortness of breath and elevated d-dimer, started empirically on heparin drip. -CT angio of the chest, if positive of course continue the heparin drip if negative discontinue.  Atrial fibrillation with RVR -Cardiology consulted, patient is on Cardizem drip, titrate to  keep heart rate around 100. -Currently has rapid ventricular response, switched to oral Cardizem when heart rate is controlled. -Obtain 3 sets of cardiac enzymes and 2-D echocardiogram.  Bilateral knee pain -Patient has history of severe arthritis, per her scheduled for bilateral knee replacement. -Right-sided arthrocentesis was done in the emergency department, studies are pending. -Patient started empirically on antibiotics.  Hypertension -Continue preadmission medications.  Code Status: Full code Family Communication: Plan discussed with  the patient in presence of her husband. Disposition Plan: Stepdown  Time spent: 70 minutes  [facility] Pager [phone]  If 7PM-7AM, please contact night-coverage [email or web address] Password [facility] 07/14/2013, 6:17 PM

## 2013-07-15 DIAGNOSIS — M7989 Other specified soft tissue disorders: Secondary | ICD-10-CM

## 2013-07-15 DIAGNOSIS — R0602 Shortness of breath: Secondary | ICD-10-CM

## 2013-07-15 LAB — BASIC METABOLIC PANEL
BUN: 28 mg/dL — ABNORMAL HIGH (ref 6–23)
CO2: 25 mEq/L (ref 19–32)
Calcium: 8.9 mg/dL (ref 8.4–10.5)
Chloride: 97 mEq/L (ref 96–112)
Creatinine, Ser: 1.01 mg/dL (ref 0.50–1.10)
Glucose, Bld: 103 mg/dL — ABNORMAL HIGH (ref 70–99)
Sodium: 132 mEq/L — ABNORMAL LOW (ref 135–145)

## 2013-07-15 LAB — TSH: TSH: 1.482 u[IU]/mL (ref 0.350–4.500)

## 2013-07-15 LAB — HEPARIN LEVEL (UNFRACTIONATED): Heparin Unfractionated: <0.1 IU/mL — ABNORMAL LOW (ref 0.30–0.70)

## 2013-07-15 MED ORDER — HEPARIN BOLUS VIA INFUSION
3000.0000 [IU] | Freq: Once | INTRAVENOUS | Status: AC
  Administered 2013-07-15: 3000 [IU] via INTRAVENOUS
  Filled 2013-07-15: qty 3000

## 2013-07-15 MED ORDER — DEXTROSE 5 % IV SOLN
1.0000 g | INTRAVENOUS | Status: DC
  Administered 2013-07-15 – 2013-07-17 (×3): 1 g via INTRAVENOUS
  Filled 2013-07-15 (×3): qty 10

## 2013-07-15 MED ORDER — DILTIAZEM HCL 60 MG PO TABS
60.0000 mg | ORAL_TABLET | Freq: Four times a day (QID) | ORAL | Status: AC
  Administered 2013-07-15 – 2013-07-16 (×6): 60 mg via ORAL
  Filled 2013-07-15 (×8): qty 1

## 2013-07-15 MED ORDER — MUPIROCIN 2 % EX OINT
1.0000 "application " | TOPICAL_OINTMENT | Freq: Two times a day (BID) | CUTANEOUS | Status: DC
  Administered 2013-07-15 – 2013-07-17 (×5): 1 via NASAL
  Filled 2013-07-15: qty 22

## 2013-07-15 MED ORDER — RIVAROXABAN 20 MG PO TABS: 20.0000 mg | ORAL_TABLET | Freq: Every day | ORAL | Status: DC

## 2013-07-15 MED ORDER — CHLORHEXIDINE GLUCONATE CLOTH 2 % EX PADS
6.0000 | MEDICATED_PAD | Freq: Every day | CUTANEOUS | Status: DC
  Administered 2013-07-15 – 2013-07-17 (×2): 6 via TOPICAL

## 2013-07-15 MED ORDER — COLCHICINE 0.6 MG PO TABS
0.6000 mg | ORAL_TABLET | Freq: Two times a day (BID) | ORAL | Status: DC
  Administered 2013-07-15 – 2013-07-17 (×5): 0.6 mg via ORAL
  Filled 2013-07-15 (×6): qty 1

## 2013-07-15 MED ORDER — RIVAROXABAN 15 MG PO TABS
15.0000 mg | ORAL_TABLET | Freq: Two times a day (BID) | ORAL | Status: DC
  Administered 2013-07-15 – 2013-07-17 (×5): 15 mg via ORAL
  Filled 2013-07-15 (×6): qty 1

## 2013-07-15 NOTE — Progress Notes (Signed)
   CARE MANAGEMENT NOTE 07/15/2013  Patient:  MONCERRAT, BURNSTEIN   Account Number:  [id number]  Date Initiated:  07/15/2013  Documentation initiated by:  [facility]  Subjective/Objective Assessment:   adm: w/fever and shortness of breath     Action/Plan:   discharge planning   Anticipated DC Date:  07/16/2013   Anticipated DC Plan:        DC Planning Services  CM consult      Choice offered to / List presented to:             Status of service:  Completed, signed off Medicare Important Message given?   (If response is "NO", the following Medicare IM given date fields will be blank) Date Medicare IM given:   Date Additional Medicare IM given:    Discharge Disposition:  HOME/SELF CARE  Per UR Regulation:    If discussed at Long Length of Stay Meetings, dates discussed:    Comments:  07/15/1416:30  CM brought Xarelto free trial card to pt and pt's husband in room.  Both have their laptops in the room and are confident they can activate card on the day of discharge.  No other CM needs were communicated.  Freddy Jaksch, BSN, CM [phone].

## 2013-07-15 NOTE — Progress Notes (Signed)
Bilateral lower extremity venous duplex completed.  Right:  DVT noted in the posterior tibial vein.  No evidence of superficial thrombosis.  No Baker's cyst.  Left:  No evidence of DVT, superficial thrombosis, or Baker's cyst.     

## 2013-07-15 NOTE — Progress Notes (Signed)
ANTICOAGULATION CONSULT NOTE - Follow Up Consult  Pharmacy Consult for heparin Indication: atrial fibrillation  Allergies  Allergen Reactions  . Nsaids Other (See Comments)    significantly raises BP  . Codeine Nausea Only    Patient Measurements: Height: 5\' 3"  (160 cm) Weight: 215 lb 13.3 oz (97.9 kg) IBW/kg (Calculated) : 52.4   Vital Signs: Temp: 98.4 F (36.9 C) (09/20 0429) Temp src: Oral (09/20 0429) BP: 115/55 mmHg (09/20 0429) Pulse Rate: 68 (09/20 0429)  Labs:  Recent Labs  07/14/13 1425 07/14/13 1445 07/14/13 2041 07/15/13 0133 07/15/13 0535  HGB 13.0 12.9  --   --   --   HCT 35.3* 38.0  --   --   --   PLT 268  --   --   --   --   HEPARINUNFRC  --   --   --   --  <0.10*  CREATININE 1.04 1.30*  --   --   --   TROPONINI  --   --  <0.30 <0.30  --     Estimated Creatinine Clearance: 46.8 ml/min (by C-G formula based on Cr of 1.3).   Medications:  Prescriptions prior to admission  Medication Sig Dispense Refill  . acetaminophen (TYLENOL) 500 MG tablet Take 1,000 mg by mouth 2 (two) times daily as needed for pain.       . cloNIDine (CATAPRES) 0.3 MG tablet Take 0.3 mg by mouth 2 (two) times daily.      . enalapril (VASOTEC) 20 MG tablet Take 20 mg by mouth daily.      . hydrochlorothiazide (MICROZIDE) 12.5 MG capsule Take 12.5 mg by mouth daily.      [name] HYDROcodone-acetaminophen (NORCO/VICODIN) 5-325 MG per tablet Take 1 tablet by mouth every 6 (six) hours as needed for pain.      [name] levothyroxine (SYNTHROID, LEVOTHROID) 125 MCG tablet Take 125 mcg by mouth daily before breakfast.      . Nutritional Supplements (JUICE PLUS FIBRE PO) Take 3 tablets by mouth daily. Takes 1 tablet of fruits, vegetables, and berry daily.      [name] OVER THE COUNTER MEDICATION Apply 1 application topically daily as needed ("essential oils" dark blue label-for pain).       [name] OVER THE COUNTER MEDICATION Take 3 tablets by mouth daily. "Doterra OnGuard capsules" herbal medication      .  tetrahydrozoline 0.05 % ophthalmic solution Place 2 drops into both eyes daily as needed (for dry eyes).      . traMADol (ULTRAM) 50 MG tablet Take 50 mg by mouth every 6 (six) hours as needed for pain.        Assessment: 67 yo lady on heparin for afib.  Initial heparin level is undetectable @ <0.1 units/ml.  No problems with infusion per RN Goal of Therapy:  Heparin level 0.3-0.7 units/ml Monitor platelets by anticoagulation protocol: Yes   Plan:  Rebolus 3000 units heparin and increase drip to 1500 units/hr Check HL 6 hours after rate change.    Branston Halsted [place] 07/15/2013,7:52 AM

## 2013-07-15 NOTE — Progress Notes (Signed)
ANTICOAGULATION CONSULT NOTE - Follow Up Consult  Pharmacy Consult for IV heparin -> Xarelto Indication: pulmonary embolus and DVT and atrial fibrillation  Allergies  Allergen Reactions  . Nsaids Other (See Comments)    significantly raises BP  . Codeine Nausea Only    Patient Measurements: Height: 5\' 3"  (160 cm) Weight: 215 lb 13.3 oz (97.9 kg) IBW/kg (Calculated) : 52.4  Vital Signs: Temp: 98.5 F (36.9 C) (09/20 1200) Temp src: Oral (09/20 1200) BP: 124/69 mmHg (09/20 1500) Pulse Rate: 79 (09/20 0800)  Labs:  Recent Labs  07/14/13 1425 07/14/13 1445 07/14/13 2041 07/15/13 0133 07/15/13 0535 07/15/13 0758  HGB 13.0 12.9  --   --   --   --   HCT 35.3* 38.0  --   --   --   --   PLT 268  --   --   --   --   --   HEPARINUNFRC  --   --   --   --  <0.10*  --   CREATININE 1.04 1.30*  --   --  1.01  --   TROPONINI  --   --  <0.30 <0.30  --  <0.30    Estimated Creatinine Clearance: 60.2 ml/min (by C-G formula based on Cr of 1.01).  Assessment:   IV heparin changed to Xarelto this morning.  Heparin stopped at time of first Xarelto dose.   RLL pulmonary embolus and right leg DVT. Atrial fibrillation -> NSR.  Vanc and Zosyn changed to Ceftriaxone.  Goal of Therapy:  therapeutic anticoagulation Monitor platelets by anticoagulation protocol: Yes   Plan:   Xarelto 15 mg BID x 21 days, then 20 mg daily with supper.  Discussed dosing schedule and bleeding precautions with patient.   Dennie Fetters, [place] Pager: [phone] 07/15/2013,4:01 PM

## 2013-07-15 NOTE — ED Provider Notes (Signed)
Medical screening examination/treatment/procedure(s) were conducted as a shared visit with non-physician practitioner(s) and myself.  I personally evaluated the patient during the encounter.  See separate note for details.  Shon Baton, MD 07/15/13 [phone]

## 2013-07-15 NOTE — Progress Notes (Signed)
  Echocardiogram 2D Echocardiogram has been performed.  Elizabeth Wang 07/15/2013, 11:06 AM

## 2013-07-15 NOTE — Progress Notes (Signed)
[facility] Progress Note [place] TEAM 1 - Stepdown/ICU TEAM   Elizabeth Wang VFI:[id number] DOB: August 13, 1946 DOA: 07/14/2013 PCP: No primary provider on file.  Admit HPI / Brief Narrative: 67 y.o. female with past medical history of hypertension and severe bilateral knee arthritis came in to the hospital with fever and shortness of breath. Patient reported that she was in her usual state of health till till Last Monday when she developed bilateral knee pain and soon she developed swelling as well. The pain was so severe, hydrocodone and tramadol were not helping. She was essentially immobilized by the pain.  Patient started to have non exertional intermittent shortness of breath. For 2 days she was experiencing fever with temp as high as 102.2.   In the ED initial evaluation showed positive d-dimer, low sodium of 126 and atrial fibrillation with rapid ventricular response. Patient was started on Cardizem drip, and heparin drip to treat empirically for PE.  Assessment/Plan:  Pulmonary embolism Transition from heparin to Xarelto - will need 6 months of therapy at minimum  Newly diagnosed Afib w/ RVR Has converted back to NSR - anticoagulated - Cardiology following   UTI vs/ pyelonephritis  follow cx data - cont empiric abx but narrow spectrum   Bilateral knee pain has history of severe arthritis - scheduled for bilateral knee replacement (Alusio) - R knee arthrocentesis was done in the emergency department w/ fluid suggestive of possible CPPD (which could account for WBC w/o organisms noted) - patient is allergic to NSAIDs - will give a trial of colchicine  HTN Blood pressure is currently well-controlled  Obesity I discussed the link between obesity and increased risk of DVT  Sleep apnea  MRSA screen + Usual contact cautions  Code Status: FULL Family Communication: Extensive discussion with patient and husband at bedside Disposition Plan:  SDU  Consultants: Cardiology  Procedures: TTE - pending  Bilateral lower extremity venous dopplers - pending  Antibiotics: Zosyn 9/19 >> 9/20 Vancomycin 9/19 >> 9/20 Rocephin 9/20 >>  DVT prophylaxis: Heparin >> Xarelto  HPI/Subjective: The patient is resting comfortably in bed at the present time.  She denies current chest pain or shortness of breath.  She does admit to ongoing B knee pain right greater than left but states that this has improved since yesterday.  She denies abdominal pain nausea or vomiting.  Objective: Blood pressure 139/76, pulse 79, temperature 98.6 F (37 C), temperature source Tympanic, resp. rate 20, height 5\' 3"  (1.6 m), weight 97.9 kg (215 lb 13.3 oz), SpO2 100.00%.  Intake/Output Summary (Last 24 hours) at 07/15/13 1120 Last data filed at 07/15/13 1017  Gross per 24 hour  Intake    806 ml  Output    900 ml  Net    -94 ml   Exam: General: No acute respiratory distress at rest Lungs: Clear to auscultation bilaterally without wheezes or crackles Cardiovascular: Regular rate and rhythm without murmur gallop or rub normal S1 and S2 Abdomen: Obese, nontender, nondistended, soft, bowel sounds positive, no rebound, no ascites, no appreciable mass Extremities: No significant cyanosis, clubbing, or edema bilateral lower extremities - no erythema or calor of bilateral knees  Data Reviewed: Basic Metabolic Panel:  Recent Labs Lab 07/14/13 1425 07/14/13 1445 07/15/13 0535  NA 126* 130* 132*  K 4.9 4.9 3.4*  CL 87* 97 97  CO2 21  --  25  GLUCOSE 103* 110* 103*  BUN 32* 46* 28*  CREATININE 1.04 1.30* 1.01  CALCIUM 9.5  --  8.9   Liver Function Tests:  Recent Labs Lab 07/14/13 1425  AST 48*  ALT 21  ALKPHOS 73  BILITOT 1.1  PROT 8.0  ALBUMIN 3.2*   CBC:  Recent Labs Lab 07/14/13 1425 07/14/13 1445  WBC 14.8*  --   HGB 13.0 12.9  HCT 35.3* 38.0  MCV 90.3  --   PLT 268  --    Cardiac Enzymes:  Recent Labs Lab 07/14/13 2041  07/15/13 0133 07/15/13 0758  TROPONINI <0.30 <0.30 <0.30   BNP (last 3 results)  Recent Labs  07/14/13 1717  PROBNP 974.9*    Recent Results (from the past 240 hour(s))  BODY FLUID CULTURE     Status: None   Collection Time    07/14/13  4:47 PM      Result Value Range Status   Specimen Description SYNOVIAL RIGHT KNEE   Final   Special Requests Normal   Final   Gram Stain     Final   Value: MODERATE WBC PRESENT,BOTH PMN AND MONONUCLEAR     NO ORGANISMS SEEN     Performed at [facility]     Performed at [facility]   Culture PENDING   Incomplete   Report Status PENDING   Incomplete  GRAM STAIN     Status: None   Collection Time    07/14/13  4:47 PM      Result Value Range Status   Specimen Description SYNOVIAL RIGHT KNEE   Final   Special Requests Normal   Final   Gram Stain     Final   Value: MODERATE WBC PRESENT,BOTH PMN AND MONONUCLEAR     NO ORGANISMS SEEN   Report Status 07/14/2013 FINAL   Final  MRSA PCR SCREENING     Status: Abnormal   Collection Time    07/14/13  6:37 PM      Result Value Range Status   MRSA by PCR POSITIVE (*) NEGATIVE Final   Comment:            The GeneXpert MRSA Assay (FDA     approved for NASAL specimens     only), is one component of a     comprehensive MRSA colonization     surveillance program. It is not     intended to diagnose MRSA     infection nor to guide or     monitor treatment for     MRSA infections.     RESULT CALLED TO, READ BACK BY AND VERIFIED WITH:     K.PORTER,RN AT 2354 BY L.PITT 07/15/13.     Studies:  Recent x-ray studies have been reviewed in detail by the Attending Physician  Scheduled Meds:  Scheduled Meds: . Chlorhexidine Gluconate Cloth  6 each Topical Q0600  . cloNIDine  0.3 mg Oral BID  . diltiazem  60 mg Oral Q6H  . hydrochlorothiazide  12.5 mg Oral Daily  . levothyroxine  125 mcg Oral QAC breakfast  . mupirocin ointment  1 application Nasal BID  . piperacillin-tazobactam  (ZOSYN)  IV  3.375 g Intravenous Q8H  . rivaroxaban  15 mg Oral BID   Followed by  . [START ON 08/05/2013] rivaroxaban  20 mg Oral Daily  . sodium chloride  3 mL Intravenous Q12H  . vancomycin  750 mg Intravenous Q12H    Time spent on care of this patient: 35 mins   [facility] T  [facility] Office  [phone] Pager - Text Page per Loretha Stapler as  per below:  On-Call/Text Page:      [email or web address]      password [email or web address]  If 7PM-7AM, please contact night-coverage [email or web address] Password [email or web address] 07/15/2013, 11:20 AM   LOS: 1 day

## 2013-07-15 NOTE — Progress Notes (Signed)
Patient ID: Elizabeth Wang, female   DOB: 1946-04-03, 67 y.o.   MRN: [id number]    Subjective:  Denies SSCP, palpitations or Dyspnea Was not informed of diagnosis of PE    Objective:  Filed Vitals:   07/14/13 2000 07/14/13 2358 07/15/13 0429 07/15/13 0800  BP: 144/78 143/56 115/55 135/60  Pulse: 114 80 68 79  Temp:  98.5 F (36.9 C) 98.4 F (36.9 C) 98.6 F (37 C)  TempSrc:  Oral Oral Tympanic  Resp: 22 16 19 20   Height: 5\' 3"  (1.6 m)     Weight: 215 lb 13.3 oz (97.9 kg)     SpO2: 80% 97% 100% 100%    Intake/Output from previous day:  Intake/Output Summary (Last 24 hours) at 07/15/13 1308 Last data filed at 07/15/13 0600  Gross per 24 hour  Intake    803 ml  Output    900 ml  Net    -97 ml    Physical Exam: Affect appropriate Healthy:  appears stated age HEENT: normal Neck supple with no adenopathy JVP normal no bruits no thyromegaly Lungs clear with no wheezing and good diaphragmatic motion Heart:  S1/S2 sEM , no rub, gallop or click PMI normal Abdomen: benighn, BS positve, no tenderness, no AAA no bruit.  No HSM or HJR Distal pulses intact with no bruits R knee acutely inflamed and swollen  Neuro non-focal Skin warm and dry No muscular weakness   Lab Results: Basic Metabolic Panel:  Recent Labs  65/78/46 1425 07/14/13 1445 07/15/13 0535  NA 126* 130* 132*  K 4.9 4.9 3.4*  CL 87* 97 97  CO2 21  --  25  GLUCOSE 103* 110* 103*  BUN 32* 46* 28*  CREATININE 1.04 1.30* 1.01  CALCIUM 9.5  --  8.9   Liver Function Tests:  Recent Labs  07/14/13 1425  AST 48*  ALT 21  ALKPHOS 73  BILITOT 1.1  PROT 8.0  ALBUMIN 3.2*   CBC:  Recent Labs  07/14/13 1425 07/14/13 1445  WBC 14.8*  --   HGB 13.0 12.9  HCT 35.3* 38.0  MCV 90.3  --   PLT 268  --    Cardiac Enzymes:  Recent Labs  07/14/13 2041 07/15/13 0133 07/15/13 0758  TROPONINI <0.30 <0.30 <0.30   D-Dimer:  Recent Labs  07/14/13 1717  DDIMER 3.77*   Thyroid Function  Tests:  Recent Labs  07/14/13 2041  TSH 1.482    Imaging: Ct Angio Chest Pe W/cm &/or Wo Cm  07/15/2013   CLINICAL DATA:  Acute shortness of Breath, atrial fibrillation.  EXAM: CT ANGIOGRAPHY CHEST WITH CONTRAST  TECHNIQUE: Multidetector CT imaging of the chest was performed using the standard protocol during bolus administration of intravenous contrast. Multiplanar CT image reconstructions including MIPs were obtained to evaluate the vascular anatomy.  CONTRAST:  OMNIPAQUE IOHEXOL 350 MG/ML SOLN  COMPARISON:  None.  FINDINGS: Single occlusive pulmonary embolus in a right lower lobe pulmonary artery . Adequate contrast opacification of the thoracic aorta with no evidence of dissection, aneurysm, or stenosis. There is classic 3-vessel brachiocephalic arch anatomy. Patchy aortic and coronary calcifications. No hilar or mediastinal adenopathy. Calcified left hilar lymph nodes. Partially calcified bilateral breast implants. No pleural or pericardial effusion. Minimal dependent atelectasis posteriorly in both lower lobes. Subpleural calcified granuloma in the superior segment left lower lobe. Lungs otherwise clear. Visualized portions of upper abdomen unremarkable. Spurring in the mid and lower thoracic spine.  Review of the MIP images confirms  the above findings.  IMPRESSION: 1. Right lower lobe pulmonary embolus. I telephoned the critical test results to T. Callahan at the time of interpretation. 2. Atherosclerosis, including coronary artery disease. Please note that although the presence of coronary artery calcium documents the presence of coronary artery disease, the severity of this disease and any potential stenosis cannot be assessed on this non-gated CT examination. Assessment for potential risk factor modification, dietary therapy or pharmacologic therapy may be warranted, if clinically indicated. 3. Old granulomatous disease.   Electronically Signed   By: Oley Balm M.D.   On: 07/15/2013  00:08   Dg Chest Portable 1 View  07/14/2013   CLINICAL DATA:  Shortness of Breath. Hypertension. Atrial fibrillation.  EXAM: PORTABLE CHEST - 1 VIEW  COMPARISON:  None.  FINDINGS: Cardiomegaly.  Pulmonary vascular congestion.  No segmental infiltrate or gross pneumothorax.  Calcified aorta.  The patient would eventually benefit from two view chest.  IMPRESSION: Cardiomegaly.  Pulmonary vascular congestion.   Electronically Signed   By: Bridgett Larsson   On: 07/14/2013 14:58    Cardiac Studies:  ECG:  afib no acute ischemic changes    Telemetry:  afib rates 80-90  Echo: pending  Medications:   . Chlorhexidine Gluconate Cloth  6 each Topical Q0600  . cloNIDine  0.3 mg Oral BID  . heparin  3,000 Units Intravenous Once  . hydrochlorothiazide  12.5 mg Oral Daily  . influenza vac split quadrivalent PF  0.5 mL Intramuscular Tomorrow-1000  . levothyroxine  125 mcg Oral QAC breakfast  . mupirocin ointment  1 application Nasal BID  . piperacillin-tazobactam (ZOSYN)  IV  3.375 g Intravenous Q8H  . sodium chloride  3 mL Intravenous Q12H  . vancomycin  750 mg Intravenous Q12H     . sodium chloride 75 mL/hr at 07/14/13 2230  . diltiazem (CARDIZEM) infusion 5 mg/hr (07/14/13 2053)  . heparin 1,200 Units/hr (07/14/13 2053)    Assessment/Plan:  PE:  She is supposed to have R TKR with Alusio in a month or so Will need to post pone likely Check LE venous duplex To see were she stands with DVT  Would use xarelto 15 bid given this and afib.  Discussed with Dr Sharon Seller Afib:  Change to PO cardizem  Echo pending  Knee:  Continue antibiotics etiology not clear fluid has been sent to lab  Hopefully anticoagulation wont make effusion and knee swelling worse   Cardiology f/u with Humberto Leep [facility] 07/15/2013, 9:58 AM

## 2013-07-16 LAB — CBC
Hemoglobin: 10.8 g/dL — ABNORMAL LOW (ref 12.0–15.0)
MCH: 32 pg (ref 26.0–34.0)
MCHC: 35.2 g/dL (ref 30.0–36.0)
MCV: 90.8 fL (ref 78.0–100.0)
Platelets: 210 10*3/uL (ref 150–400)
RBC: 3.38 MIL/uL — ABNORMAL LOW (ref 3.87–5.11)
WBC: 6.6 10*3/uL (ref 4.0–10.5)

## 2013-07-16 LAB — URINE CULTURE: Colony Count: >=100000

## 2013-07-16 LAB — BASIC METABOLIC PANEL
CO2: 26 mEq/L (ref 19–32)
Calcium: 9 mg/dL (ref 8.4–10.5)
Chloride: 98 mEq/L (ref 96–112)
Creatinine, Ser: 0.98 mg/dL (ref 0.50–1.10)
GFR calc non Af Amer: 58 mL/min — ABNORMAL LOW (ref >90)

## 2013-07-16 MED ORDER — POLYETHYLENE GLYCOL 3350 17 G PO PACK
17.0000 g | PACK | Freq: Every day | ORAL | Status: DC
  Administered 2013-07-16 – 2013-07-17 (×2): 17 g via ORAL
  Filled 2013-07-16 (×2): qty 1

## 2013-07-16 MED ORDER — POTASSIUM CHLORIDE CRYS ER 20 MEQ PO TBCR
40.0000 meq | EXTENDED_RELEASE_TABLET | Freq: Once | ORAL | Status: AC
  Administered 2013-07-16: 40 meq via ORAL
  Filled 2013-07-16: qty 2

## 2013-07-16 MED ORDER — DILTIAZEM HCL ER COATED BEADS 240 MG PO CP24
240.0000 mg | ORAL_CAPSULE | Freq: Every day | ORAL | Status: DC
  Administered 2013-07-17: 240 mg via ORAL
  Filled 2013-07-16: qty 1

## 2013-07-16 NOTE — Progress Notes (Signed)
Subjective:  Doing well. No SOB. Occasional palps.   Objective:  Vital Signs in the last 24 hours: Temp:  [98 F (36.7 C)-98.7 F (37.1 C)] 98.2 F (36.8 C) (09/21 0710) Pulse Rate:  [63-99] 78 (09/21 0710) Resp:  [0-25] 18 (09/21 0710) BP: (118-167)/(53-99) 138/67 mmHg (09/21 0710) SpO2:  [90 %-100 %] 100 % (09/21 0710) Weight:  [96.7 kg (213 lb 3 oz)] 96.7 kg (213 lb 3 oz) (09/21 0407)  Intake/Output from previous day: 09/20 0701 - 09/21 0700 In: 246 [P.O.:240; I.V.:6] Out: 1100 [Urine:1100]   Physical Exam: General: Well developed, well nourished, in no acute distress. Head:  Normocephalic and atraumatic. Lungs: Clear to auscultation and percussion. Heart: Normal S1 and S2.  No murmur, rubs or gallops.  Abdomen: soft, non-tender, positive bowel sounds. Extremities: No clubbing or cyanosis. No edema. Neurologic: Alert and oriented x 3.    Lab Results:  Recent Labs  07/14/13 1425 07/14/13 1445 07/16/13 0417  WBC 14.8*  --  6.6  HGB 13.0 12.9 10.8*  PLT 268  --  210    Recent Labs  07/15/13 0535 07/16/13 0417  NA 132* 134*  K 3.4* 3.1*  CL 97 98  CO2 25 26  GLUCOSE 103* 102*  BUN 28* 23  CREATININE 1.01 0.98    Recent Labs  07/15/13 0133 07/15/13 0758  TROPONINI <0.30 <0.30   Hepatic Function Panel  Recent Labs  07/14/13 1425  PROT 8.0  ALBUMIN 3.2*  AST 48*  ALT 21  ALKPHOS 73  BILITOT 1.1    Imaging: Ct Angio Chest Pe W/cm &/or Wo Cm  07/15/2013   CLINICAL DATA:  Acute shortness of Breath, atrial fibrillation.  EXAM: CT ANGIOGRAPHY CHEST WITH CONTRAST  TECHNIQUE: Multidetector CT imaging of the chest was performed using the standard protocol during bolus administration of intravenous contrast. Multiplanar CT image reconstructions including MIPs were obtained to evaluate the vascular anatomy.  CONTRAST:  OMNIPAQUE IOHEXOL 350 MG/ML SOLN  COMPARISON:  None.  FINDINGS: Single occlusive pulmonary embolus in a right lower lobe pulmonary  artery . Adequate contrast opacification of the thoracic aorta with no evidence of dissection, aneurysm, or stenosis. There is classic 3-vessel brachiocephalic arch anatomy. Patchy aortic and coronary calcifications. No hilar or mediastinal adenopathy. Calcified left hilar lymph nodes. Partially calcified bilateral breast implants. No pleural or pericardial effusion. Minimal dependent atelectasis posteriorly in both lower lobes. Subpleural calcified granuloma in the superior segment left lower lobe. Lungs otherwise clear. Visualized portions of upper abdomen unremarkable. Spurring in the mid and lower thoracic spine.  Review of the MIP images confirms the above findings.  IMPRESSION: 1. Right lower lobe pulmonary embolus. I telephoned the critical test results to T. Callahan at the time of interpretation. 2. Atherosclerosis, including coronary artery disease. Please note that although the presence of coronary artery calcium documents the presence of coronary artery disease, the severity of this disease and any potential stenosis cannot be assessed on this non-gated CT examination. Assessment for potential risk factor modification, dietary therapy or pharmacologic therapy may be warranted, if clinically indicated. 3. Old granulomatous disease.   Electronically Signed   By: Oley Balm M.D.   On: 07/15/2013 00:08   Dg Chest Portable 1 View  07/14/2013   CLINICAL DATA:  Shortness of Breath. Hypertension. Atrial fibrillation.  EXAM: PORTABLE CHEST - 1 VIEW  COMPARISON:  None.  FINDINGS: Cardiomegaly.  Pulmonary vascular congestion.  No segmental infiltrate or gross pneumothorax.  Calcified aorta.  The patient  would eventually benefit from two view chest.  IMPRESSION: Cardiomegaly.  Pulmonary vascular congestion.   Electronically Signed   By: Bridgett Larsson   On: 07/14/2013 14:58    Telemetry: occas PAC , no AFIBPersonally viewed.   Cardiac Studies:  EF 65%  Assessment/Plan:  Principal Problem:    Fever Active Problems:   Atrial fibrillation   SOB (shortness of breath)   Knee pain, bilateral  - doing well  - PE being tx with Xarelto, DVT as well  - No further AFIB, PAF.   - Weight loss  - EF reassuring  OK from my standpoint for DC. Dr. Mayford Knife is cardiologist.    Elizabeth Wang, Elizabeth Wang 07/16/2013, 11:19 AM

## 2013-07-16 NOTE — Progress Notes (Signed)
Heel boot for Orthopedic Tech Progress Note Patient Details:  Elizabeth Wang 1946-10-22 [id number]  Patient ID: Elizabeth Wang, female   DOB: 02-17-1946, 67 y.o.   MRN: [id number]   Elizabeth Wang 07/16/2013, 1:27 PMHeel boot for foot drop.

## 2013-07-16 NOTE — Progress Notes (Signed)
[facility] Progress Note [place] TEAM 1 - Stepdown/ICU TEAM   Elizabeth Wang DGL:[id number] DOB: 1946-05-09 DOA: 07/14/2013 PCP: No primary provider on file.  Admit HPI / Brief Narrative: 67 y.o. female with past medical history of hypertension and severe bilateral knee arthritis came in to the hospital with fever and shortness of breath. Patient reported that she was in her usual state of health till till Last Monday when she developed bilateral knee pain and soon she developed swelling as well. The pain was so severe, hydrocodone and tramadol were not helping. She was essentially immobilized by the pain.  Patient started to have non exertional intermittent shortness of breath. For 2 days she was experiencing fever with temp as high as 102.2.   In the ED initial evaluation showed positive d-dimer, low sodium of 126 and atrial fibrillation with rapid ventricular response. Patient was started on Cardizem drip, and heparin drip to treat empirically for PE.  Assessment/Plan:  Pulmonary embolism tolerating Xarelto without difficulty - will need 6 months of therapy at minimum - have discussed physiology and natural course w/ pt and husband at length both yesterday and today (despite note from Dr. Eden Emms suggesting otherwise)  Newly diagnosed Afib w/ RVR Has converted back to NSR - anticoagulated - Cardiology following - no change in tx plan   UTI vs/ pyelonephritis  cx unable to identify a single prominent pathogen - cont empiric abx   Bilateral knee pain has history of severe arthritis - scheduled for bilateral knee replacement (Alusio) - R knee arthrocentesis was done in the emergency department w/ fluid suggestive of possible CPPD (which could account for WBC w/o organisms noted) - patient is allergic to NSAIDs - appears to be responding nicely to trial of colchicine  Left foot drop Patient reports a left-sided footdrop with onset approximately 4 days ago, notably 3-4 days after she  became essentially bedridden - she denies severe pain in the low back - she does complain of some numbness in the great toe area on the dorsum of the foot - symptoms are consistent with impingement of the peroneal nerve at the fibular head - will apply footdrop splint - begin physical therapy and occupational therapy  HTN Blood pressure is currently well-controlled  Mild hypokalemia Replace and follow trend  Obesity I discussed the link between obesity and increased risk of DVT at length on 9/20  Sleep apnea Resume home cpap regimen  MRSA screen + Usual contact cautions  Code Status: FULL Family Communication: Extensive discussion with patient and husband at bedside again today  Disposition Plan: SDU - begin PT/OT - hopeful for discharge in a.m.  Consultants: Cardiology  Procedures: TTE - ejection fraction 60-65% - severe LVH - grade 2 diastolic dysfunction - RV function appears preserved Bilateral lower extremity venous dopplers - right posterior tibial DVT  Antibiotics: Zosyn 9/19 >> 9/20 Vancomycin 9/19 >> 9/20 Rocephin 9/20 >>  DVT prophylaxis: Heparin >> Xarelto  HPI/Subjective: The patient states that her knee pain is much improved.  She denies chest pain or significant shortness of breath.  She reports a left-sided footdrop which she has noted for the last 3-4 days.  Objective: Blood pressure 104/73, pulse 61, temperature 98.2 F (36.8 C), temperature source Oral, resp. rate 15, height 5\' 3"  (1.6 m), weight 96.7 kg (213 lb 3 oz), SpO2 97.00%.  Intake/Output Summary (Last 24 hours) at 07/16/13 1209 Last data filed at 07/16/13 0400  Gross per 24 hour  Intake  3 ml  Output    600 ml  Net   -597 ml   Exam: General: No acute respiratory distress at rest Lungs: Clear to auscultation bilaterally without wheezes or crackles Cardiovascular: Regular rate and rhythm without murmur gallop or rub normal S1 and S2 Abdomen: Obese, nontender, nondistended, soft, bowel  sounds positive, no rebound, no ascites, no appreciable mass Extremities: No significant cyanosis, clubbing, or edema bilateral lower extremities - no erythema or calor of bilateral knees Musculoskeletal:  The patient does appear to have impaired dorsiflexion of the left foot - she is otherwise neurologically intact in regard to the left lower extremity  Data Reviewed: Basic Metabolic Panel:  Recent Labs Lab 07/14/13 1425 07/14/13 1445 07/15/13 0535 07/16/13 0417  NA 126* 130* 132* 134*  K 4.9 4.9 3.4* 3.1*  CL 87* 97 97 98  CO2 21  --  25 26  GLUCOSE 103* 110* 103* 102*  BUN 32* 46* 28* 23  CREATININE 1.04 1.30* 1.01 0.98  CALCIUM 9.5  --  8.9 9.0   Liver Function Tests:  Recent Labs Lab 07/14/13 1425  AST 48*  ALT 21  ALKPHOS 73  BILITOT 1.1  PROT 8.0  ALBUMIN 3.2*   CBC:  Recent Labs Lab 07/14/13 1425 07/14/13 1445 07/16/13 0417  WBC 14.8*  --  6.6  HGB 13.0 12.9 10.8*  HCT 35.3* 38.0 30.7*  MCV 90.3  --  90.8  PLT 268  --  210   Cardiac Enzymes:  Recent Labs Lab 07/14/13 2041 07/15/13 0133 07/15/13 0758  TROPONINI <0.30 <0.30 <0.30   BNP (last 3 results)  Recent Labs  07/14/13 1717  PROBNP 974.9*    Recent Results (from the past 240 hour(s))  URINE CULTURE     Status: None   Collection Time    07/14/13  4:07 PM      Result Value Range Status   Specimen Description URINE, RANDOM   Final   Special Requests NONE   Final   Culture  Setup Time     Final   Value: 07/15/2013 01:38     Performed at [facility] Count     Final   Value: >=100,000 COLONIES/ML     Performed at [facility]   Culture     Final   Value: Multiple bacterial morphotypes present, none predominant. Suggest appropriate recollection if clinically indicated.     Performed at [facility]   Report Status 07/16/2013 FINAL   Final  BODY FLUID CULTURE     Status: None   Collection Time    07/14/13  4:47 PM      Result Value Range Status    Specimen Description SYNOVIAL RIGHT KNEE   Final   Special Requests Normal   Final   Gram Stain     Final   Value: MODERATE WBC PRESENT,BOTH PMN AND MONONUCLEAR     NO ORGANISMS SEEN     Performed at [facility]     Performed at [facility]   Culture     Final   Value: NO GROWTH 1 DAY     Performed at [facility]   Report Status PENDING   Incomplete  GRAM STAIN     Status: None   Collection Time    07/14/13  4:47 PM      Result Value Range Status   Specimen Description SYNOVIAL RIGHT KNEE   Final   Special Requests Normal  Final   Gram Stain     Final   Value: MODERATE WBC PRESENT,BOTH PMN AND MONONUCLEAR     NO ORGANISMS SEEN   Report Status 07/14/2013 FINAL   Final  MRSA PCR SCREENING     Status: Abnormal   Collection Time    07/14/13  6:37 PM      Result Value Range Status   MRSA by PCR POSITIVE (*) NEGATIVE Final   Comment:            The GeneXpert MRSA Assay (FDA     approved for NASAL specimens     only), is one component of a     comprehensive MRSA colonization     surveillance program. It is not     intended to diagnose MRSA     infection nor to guide or     monitor treatment for     MRSA infections.     RESULT CALLED TO, READ BACK BY AND VERIFIED WITH:     K.PORTER,RN AT 2354 BY L.PITT 07/15/13.     Studies:  Recent x-ray studies have been reviewed in detail by the Attending Physician  Scheduled Meds:  Scheduled Meds: . cefTRIAXone (ROCEPHIN)  IV  1 g Intravenous Q24H  . Chlorhexidine Gluconate Cloth  6 each Topical Q0600  . cloNIDine  0.3 mg Oral BID  . colchicine  0.6 mg Oral BID  . diltiazem  60 mg Oral Q6H  . levothyroxine  125 mcg Oral QAC breakfast  . mupirocin ointment  1 application Nasal BID  . rivaroxaban  15 mg Oral BID   Followed by  . [START ON 08/05/2013] rivaroxaban  20 mg Oral Daily  . sodium chloride  3 mL Intravenous Q12H    Time spent on care of this patient: 35 mins   [facility] T  [facility] Office  [phone] Pager - Text Page per Loretha Stapler as per below:  On-Call/Text Page:      [email or web address]      password [email or web address]  If 7PM-7AM, please contact night-coverage [email or web address] Password [email or web address] 07/16/2013, 12:09 PM   LOS: 2 days

## 2013-07-17 LAB — CBC
HCT: 31 % — ABNORMAL LOW (ref 36.0–46.0)
MCH: 31.1 pg (ref 26.0–34.0)
MCHC: 34.2 g/dL (ref 30.0–36.0)
MCV: 90.9 fL (ref 78.0–100.0)
Platelets: 231 10*3/uL (ref 150–400)
RDW: 12.8 % (ref 11.5–15.5)
WBC: 5 10*3/uL (ref 4.0–10.5)

## 2013-07-17 LAB — BASIC METABOLIC PANEL
Calcium: 9.1 mg/dL (ref 8.4–10.5)
Chloride: 102 mEq/L (ref 96–112)
Creatinine, Ser: 0.88 mg/dL (ref 0.50–1.10)
GFR calc Af Amer: 77 mL/min — ABNORMAL LOW (ref >90)

## 2013-07-17 MED ORDER — DILTIAZEM HCL ER COATED BEADS 240 MG PO CP24: 240.0000 mg | ORAL_CAPSULE | Freq: Every day | ORAL | Status: DC

## 2013-07-17 MED ORDER — RIVAROXABAN 20 MG PO TABS: 20.0000 mg | ORAL_TABLET | Freq: Every day | ORAL | Status: DC

## 2013-07-17 MED ORDER — COLCHICINE 0.6 MG PO TABS: 0.6000 mg | ORAL_TABLET | Freq: Two times a day (BID) | ORAL | Status: DC

## 2013-07-17 MED ORDER — CEFUROXIME AXETIL 500 MG PO TABS: 500.0000 mg | ORAL_TABLET | Freq: Two times a day (BID) | ORAL | Status: DC

## 2013-07-17 MED ORDER — CEFUROXIME AXETIL 500 MG PO TABS
500.0000 mg | ORAL_TABLET | Freq: Two times a day (BID) | ORAL | Status: DC
  Filled 2013-07-17 (×2): qty 1

## 2013-07-17 MED ORDER — RIVAROXABAN 15 MG PO TABS: 15.0000 mg | ORAL_TABLET | Freq: Two times a day (BID) | ORAL | Status: DC

## 2013-07-17 NOTE — Progress Notes (Signed)
Reviewed chart. No changes. Please call if questions. Patient of Dr. Gloris Manchester Turner's

## 2013-07-17 NOTE — Progress Notes (Signed)
Pt is refusing CPAP tonight.

## 2013-07-17 NOTE — Evaluation (Signed)
Physical Therapy Evaluation Patient Details Name: Elizabeth Wang MRN: [id number] DOB: October 19, 1946 Today's Date: 07/17/2013 Time: 1400-1436 PT Time Calculation (min): 36 min  PT Assessment / Plan / Recommendation History of Present Illness  Pt adm with PE, bil knee pain, and lt foot drop.  Clinical Impression  Pt with adequate mobility to return home from PT standpoint.  Believe amb will continue to improve with improvement of acute knee pain.  Pt's foot drop appears to have improved greatly.  Instructed pt in use of resting foot splint at night only if she feels tightness in her Achilles tendon.  Instructed pt in technique for stairs and for technique in going up/down curb with walker.  Instructed pt she could use the walker as she needed for pain and could stop using it when pain improved.      PT Assessment  Patent does not need any further PT services    Follow Up Recommendations  No PT follow up    Does the patient have the potential to tolerate intense rehabilitation      Barriers to Discharge        Equipment Recommendations  None recommended by PT    Recommendations for Other Services     Frequency      Precautions / Restrictions Precautions Precautions: None   Pertinent Vitals/Pain Rt knee pain with amb.      Mobility  Bed Mobility Bed Mobility: Supine to Sit;Sitting - Scoot to Edge of Bed Supine to Sit: 6: Modified independent (Device/Increase time);HOB elevated Sitting - Scoot to Edge of Bed: 6: Modified independent (Device/Increase time) Transfers Transfers: Sit to Stand;Stand to Sit Sit to Stand: 6: Modified independent (Device/Increase time);With upper extremity assist;From bed Stand to Sit: 6: Modified independent (Device/Increase time);With upper extremity assist;With armrests;To chair/3-in-1 Ambulation/Gait Ambulation/Gait Assistance: 5: Supervision;6: Modified independent (Device/Increase time) Ambulation Distance (Feet): 75 Feet Assistive device:  Rolling walker Ambulation/Gait Assistance Details: Pt able to clear lt foot without difficulty - no foot drag. Cues to look up. 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    Exercises     PT Diagnosis:    PT Problem List:   PT Treatment Interventions:       PT Goals(Current goals can be found in the care plan section) Acute Rehab PT Goals PT Goal Formulation: No goals set, d/c therapy  Visit Information  Last PT Received On: 07/17/13 Assistance Needed: +1 History of Present Illness: Pt adm with PE, bil knee pain, and lt foot drop.       Prior Functioning  Home Living Family/patient expects to be discharged to:: Private residence Living Arrangements: Spouse/significant other Available Help at Discharge: Available PRN/intermittently Type of Home: House Home Access: Level entry Home Layout: Two level;1/2 bath on main level Home Equipment: Walker - 2 wheels;Bedside commode Prior Function Level of Independence: Independent Comments: Was scheduled for TKA in 1 month. Communication Communication: No difficulties    Cognition  Cognition Arousal/Alertness: Awake/alert Behavior During Therapy: WFL for tasks assessed/performed Overall Cognitive Status: Within Functional Limits for tasks assessed    Extremity/Trunk Assessment Lower Extremity Assessment Lower Extremity Assessment: LLE deficits/detail LLE Deficits / Details: dorsiflexion 3+/5, great toe extension 1/5   Balance Balance Balance Assessed: Yes Static Standing Balance Static Standing - Balance Support: Bilateral upper extremity supported Static Standing - Level of Assistance: 6: Modified independent (Device/Increase time)  End of Session PT - End of Session Activity Tolerance: Patient  tolerated treatment well Patient left: in chair;with call bell/phone within reach  GP     [facility] 07/17/2013, 3:18 PM  [facility]  PT [phone]

## 2013-07-17 NOTE — Care Management Note (Signed)
    Page 1 of 1   07/17/2013     3:12:27 PM   CARE MANAGEMENT NOTE 07/17/2013  Patient:  Elizabeth Wang, Elizabeth Wang   Account Number:  [id number]  Date Initiated:  07/15/2013  Documentation initiated by:  [facility]  Subjective/Objective Assessment:   adm: w/fever and shortness of breath     Action/Plan:   discharge planning      DC Planning Services  CM consult   Per UR Regulation:  Reviewed for med. necessity/level of care/duration of stay  Comments:  07/17/13 0947 Verdis Prime RN MSN BSN CCM Received request to determine copay for Xarelto as pt will need same for minimum of 6 months. 1405 "PER REP AT BLUE MEDICARE, XARELTO IS COVERED, CO-PAY IS 30 DAY $40.00; MUST USE PREFERRED PHARMACY. [facility], [facility],  [facility], [facility], [facility],  [facility]".  Information and $5 copay card provided.  07/15/1416:30  CM brought Xarelto free trial card to pt and pt's husband in room.  Both have their laptops in the room and are confident they can activate card on the day of discharge.  No other CM needs were communicated.  Freddy Jaksch, BSN, CM [phone].

## 2013-07-17 NOTE — Discharge Summary (Signed)
DISCHARGE SUMMARY  Elizabeth Wang  MR#: [id number]  DOB:December 27, 1945  Date of Admission: 07/14/2013 Date of Discharge: 07/17/2013  Attending Physician:MCCLUNG,JEFFREY T  Patient's PCP:  Deboraha Sprang  Consults: Cardiology   Disposition: D/C home   Follow-up Appts:     Follow-up Information   Follow up with Quintella Reichert, MD. Schedule an appointment as soon as possible for a visit in 5 days.   Specialty:  Cardiology   Contact information:   [address] [place] [address] [phone]       Follow up with Your primary care provider. Schedule an appointment as soon as possible for a visit in 7 days. (Call your primary care provider for a follow up apointemnt in ~7 days )      Discharge Diagnoses: Pulmonary embolism  Newly diagnosed Afib w/ RVR   UTI vs/ pyelonephritis   Bilateral knee pain   Left foot drop  HTN  Mild hypokalemia  Obesity   Sleep apnea  MRSA screen +   Initial presentation: 67 y.o. female with past medical history of hypertension and severe bilateral knee arthritis came in to the hospital with fever and shortness of breath. Patient reported that she was in her usual state of health until she developed bilateral knee pain and swelling as well. The pain was so severe, hydrocodone and tramadol were not helping. She was essentially immobilized by the pain. Patient started to have non exertional intermittent shortness of breath. For 2 days she was experiencing fever with temp as high as 102.2.   In the ED initial evaluation showed positive d-dimer, low sodium of 126 and atrial fibrillation with rapid ventricular response. Patient was started on Cardizem drip, and heparin drip to treat empirically for PE.  CTA of the chest ultimately confirmed a RLL region PE.  Hospital Course:  Pulmonary embolism with R LE DVT  tolerating Xarelto without difficulty - will need 6 months of therapy at minimum - have discussed physiology and natural course w/ pt and husband  at length  Newly diagnosed Afib w/ RVR  Has converted back to NSR - anticoagulated - Cardiology evaluated - no change in tx plan - to f/u with Dr. 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 (which could account for WBC w/o organisms noted) - patient is allergic to NSAIDs - appears to be responding nicely to trial of colchicine   Left foot drop  Patient reported a left-sided footdrop with onset approximately 3 days prior to admit, notably 3-4 days after she became essentially bedridden - she denied severe pain in the low back - symptoms were consistent with impingement of the peroneal nerve at the fibular head - improved nicely with footdrop splint and PT (essentially resolved at time of d/c) - advised pt not to cross legs - pt has information to contact outpt PT should her sx return   HTN  Blood pressure is currently well-controlled   Mild hypokalemia  Replaced and normalized   Obesity  I discussed the link between obesity and increased risk of DVT at length on 9/20   Sleep apnea  Refused CPAP during hospital stay   MRSA screen +  Usual contact precautions followed  Medication List         acetaminophen 500 MG tablet  Commonly known as:  TYLENOL  Take 1,000 mg by mouth 2 (two) times daily as needed for pain.     cefUROXime 500 MG tablet  Commonly known as:  CEFTIN  Take 1 tablet (500 mg total) by mouth 2 (two) times daily with a meal.     cloNIDine 0.3 MG tablet  Commonly known as:  CATAPRES  Take 0.3 mg by mouth 2 (two) times daily.     colchicine 0.6 MG tablet  Take 1 tablet (0.6 mg total) by mouth 2 (two) times daily.     diltiazem 240 MG 24 hr capsule  Commonly known as:   CARDIZEM CD  Take 1 capsule (240 mg total) by mouth daily.     enalapril 20 MG tablet  Commonly known as:  VASOTEC  Take 20 mg by mouth daily.     hydrochlorothiazide 12.5 MG capsule  Commonly known as:  MICROZIDE  Take 12.5 mg by mouth daily.     HYDROcodone-acetaminophen 5-325 MG per tablet  Commonly known as:  NORCO/VICODIN  Take 1 tablet by mouth every 6 (six) hours as needed for pain.     JUICE PLUS FIBRE PO  Take 3 tablets by mouth daily. Takes 1 tablet of fruits, vegetables, and berry daily.     levothyroxine 125 MCG tablet  Commonly known as:  SYNTHROID, LEVOTHROID  Take 125 mcg by mouth daily before breakfast.     OVER THE COUNTER MEDICATION  Apply 1 application topically daily as needed ("essential oils" dark blue label-for pain).     OVER THE COUNTER MEDICATION  Take 3 tablets by mouth daily. "Doterra OnGuard capsules" herbal medication     Rivaroxaban 15 MG Tabs tablet  Commonly known as:  XARELTO  Take 1 tablet (15 mg total) by mouth 2 (two) times daily.     Rivaroxaban 20 MG Tabs tablet  Commonly known as:  XARELTO  Take 1 tablet (20 mg total) by mouth daily.  Start taking on:  08/05/2013     tetrahydrozoline 0.05 % ophthalmic solution  Place 2 drops into both eyes daily as needed (for dry eyes).     traMADol 50 MG tablet  Commonly known as:  ULTRAM  Take 50 mg by mouth every 6 (six) hours as needed for pain.       Day of Discharge BP 141/72  Pulse 62  Temp(Src) 98.3 F (36.8 C) (Oral)  Resp 16  Ht 5\' 3"  (1.6 m)  Wt 98.4 kg (216 lb 14.9 oz)  BMI 38.44 kg/m2  SpO2 99%  Physical Exam: General: No acute respiratory distress Lungs: Clear to auscultation bilaterally without wheezes or crackles Cardiovascular: Regular rate and rhythm without murmur gallop or rub normal S1 and S2 Abdomen: Nontender, nondistended, soft, bowel sounds positive, no rebound, no ascites, no appreciable mass Extremities: No significant cyanosis, clubbing, or edema  bilateral lower extremities  Results for orders placed during the hospital encounter of 07/14/13 (from the past 24 hour(s))  CBC     Status: Abnormal   Collection Time    07/17/13  5:40 AM      Result Value Range   WBC 5.0  4.0 - 10.5 K/uL   RBC 3.41 (*) 3.87 - 5.11 MIL/uL   Hemoglobin 10.6 (*) 12.0 - 15.0 g/dL   HCT 16.1 (*) 09.6 - 04.5 %   MCV 90.9  78.0 - 100.0 fL   MCH 31.1  26.0 - 34.0 pg   MCHC 34.2  30.0 - 36.0 g/dL   RDW 08.6  57.8 - 46.9 %   Platelets 231  150 - 400 K/uL  BASIC METABOLIC PANEL     Status: Abnormal   Collection Time    07/17/13  5:40 AM      Result Value Range   Sodium 136  135 - 145 mEq/L   Potassium 3.7  3.5 - 5.1 mEq/L   Chloride 102  96 - 112 mEq/L   CO2 24  19 - 32 mEq/L   Glucose, Bld 113 (*) 70 - 99 mg/dL   BUN 19  6 - 23 mg/dL   Creatinine, Ser 6.29  0.50 - 1.10 mg/dL   Calcium 9.1  8.4 - 52.8 mg/dL   GFR calc non Af Amer 66 (*) >90 mL/min   GFR calc Af Amer 77 (*) >90 mL/min    Time spent in discharge (includes decision making & examination of pt): >30 minutes  07/17/2013, 3:44 PM   Lonia Blood, MD [facility] Office  [phone] Pager [phone]  On-Call/Text Page:      [email or web address]      password [facility]

## 2013-07-18 LAB — BODY FLUID CULTURE: Culture: NO GROWTH

## 2013-07-18 MED ORDER — IOHEXOL 350 MG/ML SOLN
100.0000 mL | Freq: Once | INTRAVENOUS | Status: AC | PRN
  Administered 2013-07-18: 100 mL via INTRAVENOUS

## 2013-07-21 ENCOUNTER — Telehealth: Payer: Self-pay | Admitting: Hematology and Oncology

## 2013-07-21 NOTE — Telephone Encounter (Signed)
C/D 07/21/13 for appt. 10/15

## 2013-07-21 NOTE — Telephone Encounter (Signed)
S/W PT IN RE TO NP APPT 10/15 @ 9:30 W/DR. GORSUCH REFERRING DR. SHAMLEFFER DX- RECENT DVT/PE WELCOME PACKET MAILED.

## 2013-07-28 ENCOUNTER — Telehealth: Payer: Self-pay | Admitting: Cardiology

## 2013-07-28 NOTE — Telephone Encounter (Signed)
Pt requesting call from dr Mayford Knife, see orthopedic dr today and wants to know what to tell him re her condition, pls call  [phone]

## 2013-07-31 NOTE — Telephone Encounter (Signed)
Spoke with pt and we are all set.

## 2013-08-09 ENCOUNTER — Telehealth: Payer: Self-pay | Admitting: Cardiology

## 2013-08-09 ENCOUNTER — Encounter: Payer: Self-pay | Admitting: Hematology and Oncology

## 2013-08-09 ENCOUNTER — Ambulatory Visit (HOSPITAL_BASED_OUTPATIENT_CLINIC_OR_DEPARTMENT_OTHER): Payer: Medicare Other | Admitting: Hematology and Oncology

## 2013-08-09 ENCOUNTER — Ambulatory Visit: Payer: Medicare Other

## 2013-08-09 VITALS — BP 131/81 | HR 66 | Temp 97.5°F | Resp 20 | Ht 63.0 in | Wt 212.0 lb

## 2013-08-09 DIAGNOSIS — I82401 Acute embolism and thrombosis of unspecified deep veins of right lower extremity: Secondary | ICD-10-CM

## 2013-08-09 DIAGNOSIS — I2699 Other pulmonary embolism without acute cor pulmonale: Secondary | ICD-10-CM

## 2013-08-09 DIAGNOSIS — I82409 Acute embolism and thrombosis of unspecified deep veins of unspecified lower extremity: Secondary | ICD-10-CM | POA: Insufficient documentation

## 2013-08-09 MED ORDER — DILTIAZEM HCL ER COATED BEADS 240 MG PO CP24
ORAL_CAPSULE | ORAL | Status: DC
Start: 2013-08-09 — End: 2013-12-25

## 2013-08-09 MED ORDER — RIVAROXABAN 20 MG PO TABS: 20.0000 mg | ORAL_TABLET | Freq: Every day | ORAL | Status: DC

## 2013-08-09 NOTE — Progress Notes (Signed)
Checked in new pt with no financial concerns. °

## 2013-08-09 NOTE — Telephone Encounter (Signed)
New message    Question about dosage on cardizem

## 2013-08-09 NOTE — Telephone Encounter (Signed)
Pt is aware med was refilled.

## 2013-08-09 NOTE — Progress Notes (Signed)
[facility] CONSULT NOTE  Patient Care Team: Elby Showers, MD as PCP - General (Internal Medicine)  CHIEF COMPLAINTS/PURPOSE OF CONSULTATION:  DVT and PE  HISTORY OF PRESENTING ILLNESS:  Elizabeth Wang 67 y.o. female is here because of  recent diagnosis of DVT and PE According to the patient, she has chronic severe osteoarthritis of both knees and have been putting off having knee surgery for almost 2 years. Around a week and a 07/08/2013, she had a party in a location area which is approximately 60 miles away. She was entertaining cast and was on her feet the whole day. Around 07/10/2013, she felt worsening warmth, swelling, and tenderness on both knee joints, worse on the right leg. She had difficulties getting out of bed and going to the bathroom. She reduced her oral intake to reduce the likelihood of needing to go to the bathroom. Her pain got progressively worse to the point that it became uncontrolled. She subsequently called the ambulance and was brought into the hospital for evaluation. Around that time, she also complained of worsening shortness of breath while talking on the phone. She denies any pleuritic chest pain. In the hospital, right lower extremity ultrasound revealed a DVT in the right peritoneal vein and a CT angiogram showed evidence of pulmonary embolus. She was also found to have atrial fibrillation with rapid response rate, subsequently controlled well with Cardizem. She was anticoagulated with arousable and subsequently discharged home. She denies any bleeding complications such as epistaxis, hematuria, or hematochezia. The patient has never been diagnosed with blood clots before. She had numerous surgeries, has been on birth control pills, has at least 2 successful pregnancies and never was diagnosed with blood clots. There were no family history of blood clots. She is up-to-date with all her screening programs. Never been diagnosed with cancer. MEDICAL  HISTORY:  Past Medical History  Diagnosis Date  . Hypertension   . Clotting disorder     DVT/PE  . Atrial fibrillation     SURGICAL HISTORY: Past Surgical History  Procedure Laterality Date  . Breast reconstruction    . Cesarean section    . Knee arthroscopy Left     SOCIAL HISTORY: History   Social History  . Marital Status: Married    Spouse Name: N/A    Number of Children: N/A  . Years of Education: N/A   Occupational History  . Not on file.   Social History Main Topics  . Smoking status: Former Smoker -- 0.25 packs/day for 12 years  . Smokeless tobacco: Never Used  . Alcohol Use: 2.4 oz/week    4 Glasses of wine per week  . Drug Use: Not on file  . Sexual Activity: Not on file   Other Topics Concern  . Not on file   Social History Narrative  . No narrative on file    FAMILY HISTORY: History reviewed. No pertinent family history.  ALLERGIES:  is allergic to nsaids and codeine.  MEDICATIONS:  Current Outpatient Prescriptions  Medication Sig Dispense Refill  . acetaminophen (TYLENOL) 500 MG tablet Take 1,000 mg by mouth 2 (two) times daily as needed for pain.       . Cholecalciferol (VITAMIN D-3) 5000 UNITS TABS Take by mouth daily.      . cloNIDine (CATAPRES) 0.3 MG tablet Take 0.3 mg by mouth 2 (two) times daily.      . colchicine 0.6 MG tablet Take 1 tablet (0.6 mg total) by mouth 2 (two) times daily.  60 tablet  0  . Diltiazem HCl (CARDIZEM PO) Take 240 mg by mouth 2 (two) times daily.      . enalapril (VASOTEC) 20 MG tablet Take 20 mg by mouth 2 (two) times daily.       . hydrochlorothiazide (MICROZIDE) 12.5 MG capsule Take 12.5 mg by mouth daily.      [name] levothyroxine (SYNTHROID, LEVOTHROID) 125 MCG tablet Take 125 mcg by mouth daily before breakfast.      . Nutritional Supplements (JUICE PLUS FIBRE PO) Take 3 tablets by mouth daily. Takes 1 tablet of fruits, vegetables, and berry daily.      [name] OVER THE COUNTER MEDICATION Apply 1 application  topically daily as needed ("essential oils" dark blue label-for pain).       [name] OVER THE COUNTER MEDICATION Take 3 tablets by mouth daily. "Doterra OnGuard capsules" herbal medication      . Potassium Gluconate 550 MG TABS Take by mouth daily.      . Rivaroxaban (XARELTO) 20 MG TABS tablet Take 1 tablet (20 mg total) by mouth daily.  30 tablet  0  . tetrahydrozoline 0.05 % ophthalmic solution Place 2 drops into both eyes daily as needed (for dry eyes).      . traMADol (ULTRAM) 50 MG tablet Take 50 mg by mouth every 6 (six) hours as needed for pain.      [name] diltiazem (CARDIZEM CD) 240 MG 24 hr capsule Take 1 capsule BID  180 capsule  3  . Rivaroxaban (XARELTO) 20 MG TABS tablet Take 1 tablet (20 mg total) by mouth daily.  90 tablet  3   No current facility-administered medications for this visit.    REVIEW OF SYSTEMS:   Constitutional: Denies fevers, chills. She has intentional weight loss of 40 pounds over 3-4 years. No  abnormal night sweats Eyes: Denies blurriness of vision, double vision or watery eyes Ears, nose, mouth, throat, and face: Denies mucositis or sore throat Respiratory: Denies cough, dyspnea or wheezes Cardiovascular: Denies palpitation, chest discomfort or lower extremity swelling Gastrointestinal:  Denies nausea, heartburn or change in bowel habits Skin: Denies abnormal skin rashes Lymphatics: Denies new lymphadenopathy or easy bruising Neurological:Denies numbness, tingling or new weaknesses Behavioral/Psych: Mood is stable, no new changes  All other systems were reviewed with the patient and are negative.  PHYSICAL EXAMINATION: ECOG PERFORMANCE STATUS: 1 - Symptomatic but completely ambulatory  Filed Vitals:   08/09/13 1020  BP: 131/81  Pulse: 66  Temp: 97.5 F (36.4 C)  Resp: 20   Filed Weights   08/09/13 1020  Weight: 212 lb (96.163 kg)    GENERAL:alert, no distress and comfortable patient is obese  SKIN: skin color, texture, turgor are normal, no rashes  or significant lesions EYES: normal, conjunctiva are pink and non-injected, sclera clear OROPHARYNX:no exudate, no erythema and lips, buccal mucosa, and tongue normal  NECK: supple, thyroid normal size, non-tender, without nodularity LYMPH:  no palpable lymphadenopathy in the cervical, axillary or inguinal LUNGS: clear to auscultation and percussion with normal breathing effort HEART: regular rate & rhythm and no murmurs and no lower extremity edema ABDOMEN:abdomen soft, non-tender and normal bowel sounds Musculoskeletal:no cyanosis of digits and no clubbing  PSYCH: alert & oriented x 3 with fluent speech NEURO: no focal motor/sensory deficits  LABORATORY DATA:  I have reviewed the data as listed Recent Results (from the past 2160 hour(s))  CBC     Status: Abnormal   Collection Time    07/14/13  2:25  PM      Result Value Range   WBC 14.8 (*) 4.0 - 10.5 K/uL   RBC 3.91  3.87 - 5.11 MIL/uL   Hemoglobin 13.0  12.0 - 15.0 g/dL   HCT 16.1 (*) 09.6 - 04.5 %   MCV 90.3  78.0 - 100.0 fL   MCH 33.2  26.0 - 34.0 pg   MCHC 36.8 (*) 30.0 - 36.0 g/dL   RDW 40.9  81.1 - 91.4 %   Platelets 268  150 - 400 K/uL  COMPREHENSIVE METABOLIC PANEL     Status: Abnormal   Collection Time    07/14/13  2:25 PM      Result Value Range   Sodium 126 (*) 135 - 145 mEq/L   Potassium 4.9  3.5 - 5.1 mEq/L   Comment: HEMOLYSIS AT THIS LEVEL MAY AFFECT RESULT   Chloride 87 (*) 96 - 112 mEq/L   CO2 21  19 - 32 mEq/L   Glucose, Bld 103 (*) 70 - 99 mg/dL   BUN 32 (*) 6 - 23 mg/dL   Creatinine, Ser 7.82  0.50 - 1.10 mg/dL   Calcium 9.5  8.4 - 95.6 mg/dL   Total Protein 8.0  6.0 - 8.3 g/dL   Albumin 3.2 (*) 3.5 - 5.2 g/dL   AST 48 (*) 0 - 37 U/L   ALT 21  0 - 35 U/L   Comment: HEMOLYSIS AT THIS LEVEL MAY AFFECT RESULT   Alkaline Phosphatase 73  39 - 117 U/L   Comment: HEMOLYSIS AT THIS LEVEL MAY AFFECT RESULT   Total Bilirubin 1.1  0.3 - 1.2 mg/dL   GFR calc non Af Amer 54 (*) >90 mL/min   GFR calc Af Amer  63 (*) >90 mL/min   Comment: (NOTE)     The eGFR has been calculated using the CKD EPI equation.     This calculation has not been validated in all clinical situations.     eGFR's persistently <90 mL/min signify possible Chronic Kidney     Disease.  POCT I-STAT TROPONIN I     Status: None   Collection Time    07/14/13  2:44 PM      Result Value Range   Troponin i, poc 0.00  0.00 - 0.08 ng/mL   Comment 3            Comment: Due to the release kinetics of cTnI,     a negative result within the first hours     of the onset of symptoms does not rule out     myocardial infarction with certainty.     If myocardial infarction is still suspected,     repeat the test at appropriate intervals.  POCT I-STAT, CHEM 8     Status: Abnormal   Collection Time    07/14/13  2:45 PM      Result Value Range   Sodium 130 (*) 135 - 145 mEq/L   Potassium 4.9  3.5 - 5.1 mEq/L   Chloride 97  96 - 112 mEq/L   BUN 46 (*) 6 - 23 mg/dL   Creatinine, Ser 2.13 (*) 0.50 - 1.10 mg/dL   Glucose, Bld 086 (*) 70 - 99 mg/dL   Calcium, Ion 5.78 (*) 1.13 - 1.30 mmol/L   TCO2 25  0 - 100 mmol/L   Hemoglobin 12.9  12.0 - 15.0 g/dL   HCT 46.9  62.9 - 52.8 %  URINALYSIS, ROUTINE W REFLEX MICROSCOPIC  Status: Abnormal   Collection Time    07/14/13  4:07 PM      Result Value Range   Color, Urine AMBER (*) YELLOW   Comment: BIOCHEMICALS MAY BE AFFECTED BY COLOR   APPearance HAZY (*) CLEAR   Specific Gravity, Urine 1.010  1.005 - 1.030   pH 6.0  5.0 - 8.0   Glucose, UA NEGATIVE  NEGATIVE mg/dL   Hgb urine dipstick SMALL (*) NEGATIVE   Bilirubin Urine NEGATIVE  NEGATIVE   Ketones, ur NEGATIVE  NEGATIVE mg/dL   Protein, ur NEGATIVE  NEGATIVE mg/dL   Urobilinogen, UA 1.0  0.0 - 1.0 mg/dL   Nitrite NEGATIVE  NEGATIVE   Leukocytes, UA MODERATE (*) NEGATIVE  URINE MICROSCOPIC-ADD ON     Status: Abnormal   Collection Time    07/14/13  4:07 PM      Result Value Range   Squamous Epithelial / LPF FEW (*) RARE   WBC,  UA 7-10  <3 WBC/hpf   RBC / HPF 3-6  <3 RBC/hpf   Bacteria, UA FEW (*) RARE   Casts HYALINE CASTS (*) NEGATIVE  URINE CULTURE     Status: None   Collection Time    07/14/13  4:07 PM      Result Value Range   Specimen Description URINE, RANDOM     Special Requests NONE     Culture  Setup Time       Value: 07/15/2013 01:38     Performed at [facility] Count       Value: >=100,000 COLONIES/ML     Performed at [facility]   Culture       Value: Multiple bacterial morphotypes present, none predominant. Suggest appropriate recollection if clinically indicated.     Performed at [facility]   Report Status 07/16/2013 FINAL    SYNOVIAL CELL COUNT + DIFF, W/ CRYSTALS     Status: Abnormal   Collection Time    07/14/13  4:47 PM      Result Value Range   Color, Synovial YELLOW  YELLOW   Appearance-Synovial CLOUDY (*) CLEAR   Crystals, Fluid INTRACELLULAR CALCIUM PYROPHOSPHATE CRYSTALS     Comment: EXTRACELLULAR CALCIUM PYROPHOSPHATE CRYSTALS   WBC, Synovial 16109 (*) 0 - 200 /cu mm   Comment: COUNT MAY BE INACCURATE DUE TO FIBRIN CLUMPS.   Neutrophil, Synovial 84 (*) 0 - 25 %   Lymphocytes-Synovial Fld 4  0 - 20 %   Monocyte-Macrophage-Synovial Fluid 12 (*) 50 - 90 %   Eosinophils-Synovial 0  0 - 1 %   Other Cells-SYN 0    BODY FLUID CULTURE     Status: None   Collection Time    07/14/13  4:47 PM      Result Value Range   Specimen Description SYNOVIAL RIGHT KNEE     Special Requests Normal     Gram Stain       Value: MODERATE WBC PRESENT,BOTH PMN AND MONONUCLEAR     NO ORGANISMS SEEN     Performed at [facility]     Performed at [facility]   Culture       Value: NO GROWTH 3 DAYS     Performed at [facility]   Report Status 07/18/2013 FINAL    GRAM STAIN     Status: None   Collection Time    07/14/13  4:47 PM      Result Value Range  Specimen Description SYNOVIAL RIGHT KNEE     Special Requests Normal     Gram  Stain       Value: MODERATE WBC PRESENT,BOTH PMN AND MONONUCLEAR     NO ORGANISMS SEEN   Report Status 07/14/2013 FINAL    SEDIMENTATION RATE     Status: Abnormal   Collection Time    07/14/13  5:17 PM      Result Value Range   Sed Rate 81 (*) 0 - 22 mm/hr  C-REACTIVE PROTEIN     Status: Abnormal   Collection Time    07/14/13  5:17 PM      Result Value Range   CRP 25.2 (*) <0.60 mg/dL   Comment: Performed at [facility]  D-DIMER, QUANTITATIVE     Status: Abnormal   Collection Time    07/14/13  5:17 PM      Result Value Range   D-Dimer, Quant 3.77 (*) 0.00 - 0.48 ug/mL-FEU   Comment:            AT THE INHOUSE ESTABLISHED CUTOFF     VALUE OF 0.48 ug/mL FEU,     THIS ASSAY HAS BEEN DOCUMENTED     IN THE LITERATURE TO HAVE     A SENSITIVITY AND NEGATIVE     PREDICTIVE VALUE OF AT LEAST     98 TO 99%.  THE TEST RESULT     SHOULD BE CORRELATED WITH     AN ASSESSMENT OF THE CLINICAL     PROBABILITY OF DVT / VTE.  PRO B NATRIURETIC PEPTIDE     Status: Abnormal   Collection Time    07/14/13  5:17 PM      Result Value Range   Pro B Natriuretic peptide (BNP) 974.9 (*) 0 - 125 pg/mL  POCT I-STAT TROPONIN I     Status: None   Collection Time    07/14/13  5:24 PM      Result Value Range   Troponin i, poc 0.01  0.00 - 0.08 ng/mL   Comment 3            Comment: Due to the release kinetics of cTnI,     a negative result within the first hours     of the onset of symptoms does not rule out     myocardial infarction with certainty.     If myocardial infarction is still suspected,     repeat the test at appropriate intervals.  MRSA PCR SCREENING     Status: Abnormal   Collection Time    07/14/13  6:37 PM      Result Value Range   MRSA by PCR POSITIVE (*) NEGATIVE   Comment:            The GeneXpert MRSA Assay (FDA     approved for NASAL specimens     only), is one component of a     comprehensive MRSA colonization     surveillance program. It is not     intended to  diagnose MRSA     infection nor to guide or     monitor treatment for     MRSA infections.     RESULT CALLED TO, READ BACK BY AND VERIFIED WITH:     K.PORTER,RN AT 2354 BY L.PITT 07/15/13.  TSH     Status: None   Collection Time    07/14/13  8:41 PM      Result Value Range   TSH 1.482  0.350 - 4.500 uIU/mL   Comment: Performed at [facility]  TROPONIN I     Status: None   Collection Time    07/14/13  8:41 PM      Result Value Range   Troponin I <0.30  <0.30 ng/mL   Comment:            Due to the release kinetics of cTnI,     a negative result within the first hours     of the onset of symptoms does not rule out     myocardial infarction with certainty.     If myocardial infarction is still suspected,     repeat the test at appropriate intervals.  TROPONIN I     Status: None   Collection Time    07/15/13  1:33 AM      Result Value Range   Troponin I <0.30  <0.30 ng/mL   Comment:            Due to the release kinetics of cTnI,     a negative result within the first hours     of the onset of symptoms does not rule out     myocardial infarction with certainty.     If myocardial infarction is still suspected,     repeat the test at appropriate intervals.  BASIC METABOLIC PANEL     Status: Abnormal   Collection Time    07/15/13  5:35 AM      Result Value Range   Sodium 132 (*) 135 - 145 mEq/L   Potassium 3.4 (*) 3.5 - 5.1 mEq/L   Comment: DELTA CHECK NOTED   Chloride 97  96 - 112 mEq/L   CO2 25  19 - 32 mEq/L   Glucose, Bld 103 (*) 70 - 99 mg/dL   BUN 28 (*) 6 - 23 mg/dL   Comment: DELTA CHECK NOTED   Creatinine, Ser 1.01  0.50 - 1.10 mg/dL   Calcium 8.9  8.4 - 16.1 mg/dL   GFR calc non Af Amer 56 (*) >90 mL/min   GFR calc Af Amer 65 (*) >90 mL/min   Comment: (NOTE)     The eGFR has been calculated using the CKD EPI equation.     This calculation has not been validated in all clinical situations.     eGFR's persistently <90 mL/min signify possible Chronic  Kidney     Disease.  HEPARIN LEVEL (UNFRACTIONATED)     Status: Abnormal   Collection Time    07/15/13  5:35 AM      Result Value Range   Heparin Unfractionated <0.10 (*) 0.30 - 0.70 IU/mL   Comment:            IF HEPARIN RESULTS ARE BELOW     EXPECTED VALUES, AND PATIENT     DOSAGE HAS BEEN CONFIRMED,     SUGGEST FOLLOW UP TESTING     OF ANTITHROMBIN III LEVELS.  TROPONIN I     Status: None   Collection Time    07/15/13  7:58 AM      Result Value Range   Troponin I <0.30  <0.30 ng/mL   Comment:            Due to the release kinetics of cTnI,     a negative result within the first hours     of the onset of symptoms does not rule out     myocardial infarction with certainty.     If  myocardial infarction is still suspected,     repeat the test at appropriate intervals.  CBC     Status: Abnormal   Collection Time    07/16/13  4:17 AM      Result Value Range   WBC 6.6  4.0 - 10.5 K/uL   RBC 3.38 (*) 3.87 - 5.11 MIL/uL   Hemoglobin 10.8 (*) 12.0 - 15.0 g/dL   HCT 52.8 (*) 41.3 - 24.4 %   MCV 90.8  78.0 - 100.0 fL   MCH 32.0  26.0 - 34.0 pg   MCHC 35.2  30.0 - 36.0 g/dL   RDW 01.0  27.2 - 53.6 %   Platelets 210  150 - 400 K/uL  BASIC METABOLIC PANEL     Status: Abnormal   Collection Time    07/16/13  4:17 AM      Result Value Range   Sodium 134 (*) 135 - 145 mEq/L   Potassium 3.1 (*) 3.5 - 5.1 mEq/L   Chloride 98  96 - 112 mEq/L   CO2 26  19 - 32 mEq/L   Glucose, Bld 102 (*) 70 - 99 mg/dL   BUN 23  6 - 23 mg/dL   Creatinine, Ser 6.44  0.50 - 1.10 mg/dL   Calcium 9.0  8.4 - 03.4 mg/dL   GFR calc non Af Amer 58 (*) >90 mL/min   GFR calc Af Amer 68 (*) >90 mL/min   Comment: (NOTE)     The eGFR has been calculated using the CKD EPI equation.     This calculation has not been validated in all clinical situations.     eGFR's persistently <90 mL/min signify possible Chronic Kidney     Disease.  CBC     Status: Abnormal   Collection Time    07/17/13  5:40 AM      Result  Value Range   WBC 5.0  4.0 - 10.5 K/uL   RBC 3.41 (*) 3.87 - 5.11 MIL/uL   Hemoglobin 10.6 (*) 12.0 - 15.0 g/dL   HCT 74.2 (*) 59.5 - 63.8 %   MCV 90.9  78.0 - 100.0 fL   MCH 31.1  26.0 - 34.0 pg   MCHC 34.2  30.0 - 36.0 g/dL   RDW 75.6  43.3 - 29.5 %   Platelets 231  150 - 400 K/uL  BASIC METABOLIC PANEL     Status: Abnormal   Collection Time    07/17/13  5:40 AM      Result Value Range   Sodium 136  135 - 145 mEq/L   Potassium 3.7  3.5 - 5.1 mEq/L   Chloride 102  96 - 112 mEq/L   CO2 24  19 - 32 mEq/L   Glucose, Bld 113 (*) 70 - 99 mg/dL   BUN 19  6 - 23 mg/dL   Creatinine, Ser 1.88  0.50 - 1.10 mg/dL   Calcium 9.1  8.4 - 41.6 mg/dL   GFR calc non Af Amer 66 (*) >90 mL/min   GFR calc Af Amer 77 (*) >90 mL/min   Comment: (NOTE)     The eGFR has been calculated using the CKD EPI equation.     This calculation has not been validated in all clinical situations.     eGFR's persistently <90 mL/min signify possible Chronic Kidney     Disease.    RADIOGRAPHIC STUDIES: I have personally reviewed the radiological images as listed and agreed with the findings in the report.  Ct Angio Chest Pe W/cm &/or Wo Cm  07/15/2013   CLINICAL DATA:  Acute shortness of Breath, atrial fibrillation.  EXAM: CT ANGIOGRAPHY CHEST WITH CONTRAST  TECHNIQUE: Multidetector CT imaging of the chest was performed using the standard protocol during bolus administration of intravenous contrast. Multiplanar CT image reconstructions including MIPs were obtained to evaluate the vascular anatomy.  CONTRAST:  OMNIPAQUE IOHEXOL 350 MG/ML SOLN  COMPARISON:  None.  FINDINGS: Single occlusive pulmonary embolus in a right lower lobe pulmonary artery . Adequate contrast opacification of the thoracic aorta with no evidence of dissection, aneurysm, or stenosis. There is classic 3-vessel brachiocephalic arch anatomy. Patchy aortic and coronary calcifications. No hilar or mediastinal adenopathy. Calcified left hilar lymph  nodes. Partially calcified bilateral breast implants. No pleural or pericardial effusion. Minimal dependent atelectasis posteriorly in both lower lobes. Subpleural calcified granuloma in the superior segment left lower lobe. Lungs otherwise clear. Visualized portions of upper abdomen unremarkable. Spurring in the mid and lower thoracic spine.  Review of the MIP images confirms the above findings.  IMPRESSION: 1. Right lower lobe pulmonary embolus. I telephoned the critical test results to T. Callahan at the time of interpretation. 2. Atherosclerosis, including coronary artery disease. Please note that although the presence of coronary artery calcium documents the presence of coronary artery disease, the severity of this disease and any potential stenosis cannot be assessed on this non-gated CT examination. Assessment for potential risk factor modification, dietary therapy or pharmacologic therapy may be warranted, if clinically indicated. 3. Old granulomatous disease.   Electronically Signed   By: Oley Balm M.D.   On: 07/15/2013 00:08   Dg Chest Portable 1 View  07/14/2013   CLINICAL DATA:  Shortness of Breath. Hypertension. Atrial fibrillation.  EXAM: PORTABLE CHEST - 1 VIEW  COMPARISON:  None.  FINDINGS: Cardiomegaly.  Pulmonary vascular congestion.  No segmental infiltrate or gross pneumothorax.  Calcified aorta.  The patient would eventually benefit from two view chest.  IMPRESSION: Cardiomegaly.  Pulmonary vascular congestion.   Electronically Signed   By: Bridgett Larsson   On: 07/14/2013 14:58    ASSESSMENT:  Recent DVT and PE  PLAN:  #1 recent DVT and PE The DVT and PE is clearly provoked. The patient had recent travel, dehydration, pseudogout attack of her knee joint and reduced mobility. I did nothing would look at the thrombophilia disorder here. She had multiple surgical challenges and had been exposed to pregnancies and birth-control pills and walls never diagnosed with blood clots until  recently. I do not recommend ordering excessive blood work to rule out thrombophilia disorder. It will not change management. I recommend minimal anticoagulation therapy for 6 months. I will see you back in 6 months with repeat blood work and ultrasound venous Doppler to assess and establish a new baseline. We had extensive discussion about prevention against risk of thrombophlebitis using elastic compression hose but the patient declined. She is interested in physical therapy and I encouraged that. I see no contraindication for her to proceed. #2 severe osteoarthritis in both knees Discuss with the patient that there is high risk of thrombosis after surgery for knee replacement surgery. If she is interested to proceed with surgery after 6 months of anticoagulation therapy, she would require aggressive perioperative DVT management with low molecular weight heparin or other agents. We have extensive discussion about this. Orders Placed This Encounter  Procedures  . D-dimer, quantitative    Standing Status: Future     Number of Occurrences:  Standing Expiration Date: 08/09/2014  . Comprehensive metabolic panel    Standing Status: Future     Number of Occurrences:      Standing Expiration Date: 08/09/2014  . CBC with Differential    Standing Status: Future     Number of Occurrences:      Standing Expiration Date: 08/09/2014  . Lower Extremity Venous Duplex Right    Standing Status: Future     Number of Occurrences:      Standing Expiration Date: 08/09/2014    Order Specific Question:  Laterality    Answer:  Right    Order Specific Question:  Where should this test be performed:    Answer:  [facility]    All questions were answered. The patient knows to call the clinic with any problems, questions or concerns. I spent 55 minutes counseling the patient face to face. The total time spent in the appointment was 60 minutes and more than 50% was on counseling.     [facility], Dmitry Macomber,  MD 08/09/2013 5:03 PM

## 2013-08-10 ENCOUNTER — Other Ambulatory Visit: Payer: Self-pay | Admitting: *Deleted

## 2013-08-10 ENCOUNTER — Other Ambulatory Visit: Payer: Self-pay | Admitting: Hematology and Oncology

## 2013-08-10 DIAGNOSIS — I82401 Acute embolism and thrombosis of unspecified deep veins of right lower extremity: Secondary | ICD-10-CM

## 2013-08-10 DIAGNOSIS — M25561 Pain in right knee: Secondary | ICD-10-CM

## 2013-08-10 NOTE — Progress Notes (Signed)
Faxed referral for PHT to [facility] at fax [phone], per Dr. Bertis Ruddy for assistance in ambulating due to knee pain and recent DVT.

## 2013-08-11 ENCOUNTER — Telehealth: Payer: Self-pay | Admitting: Hematology and Oncology

## 2013-08-11 ENCOUNTER — Telehealth: Payer: Self-pay | Admitting: *Deleted

## 2013-08-11 NOTE — Telephone Encounter (Signed)
[facility] for pt re appt for 02/07/14 lb/doppler and 02/09/14 NG. Schedule mailed. Copy of doppler order to [facility] to check for preauth.

## 2013-08-11 NOTE — Telephone Encounter (Signed)
Yes, please.

## 2013-08-11 NOTE — Telephone Encounter (Signed)
Pt asks if her appts in April 2015 can be moved up by one month to March 2015?  States Dr. Bertis Ruddy recommended pt stay on "blood thinners" for 6 months and she actually started on them last month on Sept 19th.

## 2013-08-18 ENCOUNTER — Telehealth: Payer: Self-pay | Admitting: Hematology and Oncology

## 2013-08-18 NOTE — Telephone Encounter (Signed)
pt called to r/s appt...done °

## 2013-09-01 ENCOUNTER — Inpatient Hospital Stay: Admit: 2013-09-01 | Payer: Medicare Other | Admitting: Orthopedic Surgery

## 2013-09-01 SURGERY — ARTHROPLASTY, KNEE, TOTAL
Anesthesia: Choice | Site: Knee | Laterality: Right

## 2013-10-03 ENCOUNTER — Telehealth: Payer: Self-pay | Admitting: Hematology and Oncology

## 2013-10-03 NOTE — Telephone Encounter (Signed)
s.w pt and r/s all appt per pt request °

## 2013-10-31 ENCOUNTER — Other Ambulatory Visit: Payer: Self-pay | Admitting: Internal Medicine

## 2013-10-31 ENCOUNTER — Other Ambulatory Visit (HOSPITAL_COMMUNITY)
Admission: RE | Admit: 2013-10-31 | Discharge: 2013-10-31 | Disposition: A | Discharge status: Home / Self Care | Payer: Medicare Other | Source: Ambulatory Visit | Attending: Internal Medicine | Admitting: Internal Medicine

## 2013-10-31 DIAGNOSIS — Z1151 Encounter for screening for human papillomavirus (HPV): Secondary | ICD-10-CM | POA: Insufficient documentation

## 2013-10-31 DIAGNOSIS — Z124 Encounter for screening for malignant neoplasm of cervix: Secondary | ICD-10-CM | POA: Insufficient documentation

## 2013-11-28 ENCOUNTER — Other Ambulatory Visit (HOSPITAL_BASED_OUTPATIENT_CLINIC_OR_DEPARTMENT_OTHER): Payer: Medicare Other

## 2013-11-28 ENCOUNTER — Encounter: Payer: Self-pay | Admitting: General Surgery

## 2013-11-28 ENCOUNTER — Telehealth: Payer: Self-pay | Admitting: *Deleted

## 2013-11-28 ENCOUNTER — Ambulatory Visit (HOSPITAL_COMMUNITY)
Admission: RE | Admit: 2013-11-28 | Discharge: 2013-11-28 | Disposition: A | Discharge status: Home / Self Care | Payer: Medicare Other | Source: Ambulatory Visit | Attending: Hematology and Oncology | Admitting: Hematology and Oncology

## 2013-11-28 DIAGNOSIS — Z09 Encounter for follow-up examination after completed treatment for conditions other than malignant neoplasm: Secondary | ICD-10-CM | POA: Insufficient documentation

## 2013-11-28 DIAGNOSIS — I82401 Acute embolism and thrombosis of unspecified deep veins of right lower extremity: Secondary | ICD-10-CM

## 2013-11-28 DIAGNOSIS — I2699 Other pulmonary embolism without acute cor pulmonale: Secondary | ICD-10-CM | POA: Insufficient documentation

## 2013-11-28 DIAGNOSIS — I10 Essential (primary) hypertension: Secondary | ICD-10-CM | POA: Insufficient documentation

## 2013-11-28 DIAGNOSIS — I4891 Unspecified atrial fibrillation: Secondary | ICD-10-CM

## 2013-11-28 DIAGNOSIS — Z86718 Personal history of other venous thrombosis and embolism: Secondary | ICD-10-CM | POA: Insufficient documentation

## 2013-11-28 DIAGNOSIS — I82409 Acute embolism and thrombosis of unspecified deep veins of unspecified lower extremity: Secondary | ICD-10-CM

## 2013-11-28 DIAGNOSIS — I480 Paroxysmal atrial fibrillation: Secondary | ICD-10-CM | POA: Insufficient documentation

## 2013-11-28 LAB — CBC WITH DIFFERENTIAL/PLATELET
BASO%: 0.9 % (ref 0.0–2.0)
Basophils Absolute: 0.1 10*3/uL (ref 0.0–0.1)
EOS%: 2.2 % (ref 0.0–7.0)
Eosinophils Absolute: 0.2 10*3/uL (ref 0.0–0.5)
HEMATOCRIT: 38.8 % (ref 34.8–46.6)
HGB: 13.1 g/dL (ref 11.6–15.9)
LYMPH%: 23.5 % (ref 14.0–49.7)
MCH: 33 pg (ref 25.1–34.0)
MCHC: 33.8 g/dL (ref 31.5–36.0)
MCV: 97.6 fL (ref 79.5–101.0)
MONO#: 1.2 10*3/uL — AB (ref 0.1–0.9)
MONO%: 11.9 % (ref 0.0–14.0)
NEUT#: 6.2 10*3/uL (ref 1.5–6.5)
NEUT%: 61.5 % (ref 38.4–76.8)
PLATELETS: 208 10*3/uL (ref 145–400)
RBC: 3.97 10*6/uL (ref 3.70–5.45)
RDW: 13.4 % (ref 11.2–14.5)
WBC: 10.2 10*3/uL (ref 3.9–10.3)
lymph#: 2.4 10*3/uL (ref 0.9–3.3)

## 2013-11-28 LAB — COMPREHENSIVE METABOLIC PANEL (CC13)
ALBUMIN: 3.9 g/dL (ref 3.5–5.0)
ALK PHOS: 51 U/L (ref 40–150)
ALT: 30 U/L (ref 0–55)
AST: 20 U/L (ref 5–34)
Anion Gap: 8 mEq/L (ref 3–11)
BUN: 21.9 mg/dL (ref 7.0–26.0)
CO2: 30 mEq/L — ABNORMAL HIGH (ref 22–29)
Calcium: 9.7 mg/dL (ref 8.4–10.4)
Chloride: 98 mEq/L (ref 98–109)
Creatinine: 1 mg/dL (ref 0.6–1.1)
Glucose: 85 mg/dl (ref 70–140)
Potassium: 3.7 mEq/L (ref 3.5–5.1)
SODIUM: 136 meq/L (ref 136–145)
TOTAL PROTEIN: 6.6 g/dL (ref 6.4–8.3)
Total Bilirubin: 0.87 mg/dL (ref 0.20–1.20)

## 2013-11-28 NOTE — Progress Notes (Signed)
*  PRELIMINARY RESULTS* Vascular Ultrasound Right lower extremity venous duplex has been completed.  Preliminary findings: no evidence of DVT.  Called report to [place] at Dr. Calton Dach office.   Landry Mellow, RDMS, RVT  11/28/2013, 10:56 AM

## 2013-11-28 NOTE — Telephone Encounter (Signed)
Call from Vascular Lab pt is negative for DVT.

## 2013-11-29 LAB — D-DIMER, QUANTITATIVE (NOT AT ARMC): D DIMER QUANT: 0.28 ug{FEU}/mL (ref 0.00–0.48)

## 2013-11-30 ENCOUNTER — Ambulatory Visit (HOSPITAL_BASED_OUTPATIENT_CLINIC_OR_DEPARTMENT_OTHER): Payer: Medicare Other | Admitting: Hematology and Oncology

## 2013-11-30 ENCOUNTER — Encounter: Payer: Self-pay | Admitting: Hematology and Oncology

## 2013-11-30 VITALS — BP 159/83 | HR 82 | Temp 97.3°F | Resp 18 | Ht 63.0 in | Wt 210.0 lb

## 2013-11-30 DIAGNOSIS — M1710 Unilateral primary osteoarthritis, unspecified knee: Secondary | ICD-10-CM

## 2013-11-30 DIAGNOSIS — I2699 Other pulmonary embolism without acute cor pulmonale: Secondary | ICD-10-CM

## 2013-11-30 DIAGNOSIS — M25562 Pain in left knee: Principal | ICD-10-CM

## 2013-11-30 DIAGNOSIS — I82409 Acute embolism and thrombosis of unspecified deep veins of unspecified lower extremity: Secondary | ICD-10-CM

## 2013-11-30 DIAGNOSIS — M25561 Pain in right knee: Secondary | ICD-10-CM

## 2013-11-30 NOTE — Progress Notes (Signed)
[name]  [name], [name], MD DIAGNOSIS:  History of right lower extremity DVT and PE  SUMMARY OF HEMATOLOGIC HISTORY: This is a patient seen on 08/09/2013 for diagnosis of right lower extremity DVT and PE. The patient's had severe arthritis requiring surgery with immobilization. She was admitted to the hospital with right lower extremity DVT and PE. She was anticoagulated with Xarelto INTERVAL HISTORY: Elizabeth Wang 68 y.o. female returns for further followup. She has no bleeding complication with her anticoagulation therapy. She remained not very mobile due to the severe arthritis of the knee. Denies any chest pain, shortness of breath or dizziness I have reviewed the past medical history, past surgical history, social history and family history with the patient and they are unchanged from previous note.  ALLERGIES:  is allergic to nsaids and codeine.  MEDICATIONS:  Current Outpatient Prescriptions  Medication Sig Dispense Refill  . acetaminophen (TYLENOL) 500 MG tablet Take 1,000 mg by mouth 2 (two) times daily as needed for pain.       . Cholecalciferol (VITAMIN D-3) 5000 UNITS TABS Take by mouth daily.      . cloNIDine (CATAPRES) 0.3 MG tablet Take 0.3 mg by mouth 2 (two) times daily.      . colchicine 0.6 MG tablet Take 1 tablet (0.6 mg total) by mouth 2 (two) times daily.  60 tablet  0  . diltiazem (CARDIZEM CD) 240 MG 24 hr capsule Take 1 capsule BID  180 capsule  3  . enalapril (VASOTEC) 20 MG tablet Take 20 mg by mouth 2 (two) times daily.       [name] EPINEPHrine (EPIPEN) 0.3 mg/0.3 mL SOAJ injection Inject 0.3 mg into the muscle as needed.      . hydrochlorothiazide (MICROZIDE) 12.5 MG capsule Take 12.5 mg by mouth daily.      [name] levothyroxine (SYNTHROID, LEVOTHROID) 125 MCG tablet Take 125 mcg by mouth daily before breakfast.      . Nutritional Supplements (JUICE PLUS FIBRE PO) Take 3 tablets by mouth daily. Takes 1 tablet of  fruits, vegetables, and berry daily.      [name] OVER THE COUNTER MEDICATION Apply 1 application topically daily as needed ("essential oils" dark blue label-for pain).       [name] OVER THE COUNTER MEDICATION Take 3 tablets by mouth daily. "Doterra OnGuard capsules" herbal medication      . Potassium Gluconate 550 MG TABS Take by mouth daily.      . Rivaroxaban (XARELTO) 20 MG TABS tablet Take 1 tablet (20 mg total) by mouth daily.  90 tablet  3  . traMADol (ULTRAM) 50 MG tablet Take 50 mg by mouth every 6 (six) hours as needed for pain.      [name] zolpidem (AMBIEN) 10 MG tablet Take 10 mg by mouth at bedtime as needed for sleep.       No current facility-administered medications for this visit.     REVIEW OF SYSTEMS:   Constitutional: Denies fevers, chills or night sweats Eyes: Denies blurriness of vision Ears, nose, mouth, throat, and face: Denies mucositis or sore throat Respiratory: Denies cough, dyspnea or wheezes Cardiovascular: Denies palpitation, chest discomfort or lower extremity swelling Gastrointestinal:  Denies nausea, heartburn or change in bowel habits Skin: Denies abnormal skin rashes Lymphatics: Denies new lymphadenopathy or easy bruising Neurological:Denies numbness, tingling or new weaknesses Behavioral/Psych: Mood is stable, no new changes  All other systems were reviewed with the patient and are negative.  PHYSICAL EXAMINATION: ECOG PERFORMANCE STATUS: 0 - Asymptomatic  Filed Vitals:   11/30/13 1409  BP: 159/83  Pulse: 82  Temp: 97.3 F (36.3 C)  Resp: 18   Filed Weights   11/30/13 1409  Weight: 210 lb (95.255 kg)    GENERAL:alert, no distress and comfortable. She is morbidly obese SKIN: skin color, texture, turgor are normal, no rashes or significant lesions Musculoskeletal:no cyanosis of digits and no clubbing  NEURO: alert & oriented x 3 with fluent speech, no focal motor/sensory deficits  LABORATORY DATA:  I have reviewed the data as listed No results found  for this or any previous visit (from the past 48 hour(s)).  Lab Results  Component Value Date   WBC 10.2 11/28/2013   HGB 13.1 11/28/2013   HCT 38.8 11/28/2013   MCV 97.6 11/28/2013   PLT 208 11/28/2013    RADIOGRAPHIC STUDIES: Her most recent repeat ultrasound was negative for DVT I have personally reviewed the radiological images as listed and agreed with the findings in the report.  ASSESSMENT & PLAN:  #1 right lower extremity DVT and PE #2 severe knee DJD I felt that her most recent blood clot was provoked. She will be completing 6 months of anticoagulation therapy soon. She has surgery scheduled in the first week of March. I recommend she continue her anticoagulation therapy until early March. I recommend aggressive DVT prophylaxis after surgery.  Within 24-48 hours after her knee surgery, if there were no bleeding complications, I recommend she is started on prophylactic dose of Lovenox 40 mg subcutaneous once a day. Once she is discharged, I would defer to the orthopedic surgeon for the choice of anticoagulation therapy. She needs to remain on aggressive DVT prophylaxis for 6 weeks after knee surgery. After 6 weeks, I recommend she go on preventive dose of aspirin 162 mg daily for the rest of her life to prevent recurrence of DVT. All questions were answered. The patient knows to call the clinic with any problems, questions or concerns. No barriers to learning was detected.  I spent 15 minutes counseling the patient face to face. The total time spent in the appointment was 20 minutes and more than 50% was on counseling.     [place], [place], MD 11/30/2013 2:30 PM

## 2013-12-06 ENCOUNTER — Ambulatory Visit (INDEPENDENT_AMBULATORY_CARE_PROVIDER_SITE_OTHER): Payer: Medicare Other | Admitting: Cardiology

## 2013-12-06 ENCOUNTER — Encounter: Payer: Self-pay | Admitting: Cardiology

## 2013-12-06 VITALS — BP 169/86 | HR 75 | Ht 64.0 in | Wt 207.1 lb

## 2013-12-06 DIAGNOSIS — I10 Essential (primary) hypertension: Secondary | ICD-10-CM

## 2013-12-06 DIAGNOSIS — I4891 Unspecified atrial fibrillation: Secondary | ICD-10-CM

## 2013-12-06 NOTE — Progress Notes (Signed)
[facility], [address] [place], [place]  [address] Phone: [phone] Fax:  [phone]  Date:  12/06/2013   ID:  Natisha Trzcinski, DOB May 06, 1946, MRN [id number]  PCP:  Cloyd Stagers, MD  Cardiologist:  Fransico Him, MD     History of Present Illness: Elizabeth Wang is a 68 y.o. female with a history of Atrial fibrillation and HTN who presents today for followup.  She denies any chest pain, SOB, DOE, LE edema, dizziness, palpitations or syncope.   Wt Readings from Last 3 Encounters:  12/06/13 207 lb 1.9 oz (93.949 kg)  11/30/13 210 lb (95.255 kg)  08/09/13 212 lb (96.163 kg)     Past Medical History  Diagnosis Date  . Hypertension   . Clotting disorder     DVT/PE  . Obesity   . Hypothyroidism   . Obstructive sleep apnea (adult) (pediatric)   . Esophageal reflux   . Dyslipidemia   . Diverticulosis   . Acute pulmonary embolism 06/2013    now on xarelto follows w hematology  . Atrial fibrillation 06/2013    w RVR due to #1 now in NSR    Current Outpatient Prescriptions  Medication Sig Dispense Refill  . acetaminophen (TYLENOL) 500 MG tablet Take 1,000 mg by mouth 2 (two) times daily as needed for pain.       . cloNIDine (CATAPRES) 0.3 MG tablet Take 0.3 mg by mouth 2 (two) times daily.      . colchicine 0.6 MG tablet Take 1 tablet (0.6 mg total) by mouth 2 (two) times daily.  60 tablet  0  . diltiazem (CARDIZEM CD) 240 MG 24 hr capsule Take 1 capsule BID  180 capsule  3  . diptheria-tetanus toxoids (DECAVAC) 2-2 LF/0.5ML injection       . enalapril (VASOTEC) 20 MG tablet Take 20 mg by mouth 2 (two) times daily.       [name] EPINEPHrine (EPIPEN) 0.3 mg/0.3 mL SOAJ injection Inject 0.3 mg into the muscle as needed.      . hydrochlorothiazide (MICROZIDE) 12.5 MG capsule Take 12.5 mg by mouth daily.      [name] levothyroxine (SYNTHROID, LEVOTHROID) 125 MCG tablet Take 125 mcg by mouth daily before breakfast.      . Nutritional Supplements (JUICE PLUS FIBRE PO) Take 3 tablets by  mouth daily. Takes 1 tablet of fruits, vegetables, and berry daily.      [name] OVER THE COUNTER MEDICATION Apply 1 application topically daily as needed ("essential oils" dark blue label-for pain).       [name] OVER THE COUNTER MEDICATION Take 3 tablets by mouth daily. "Doterra OnGuard capsules" herbal medication      . Potassium Gluconate 550 MG TABS Take by mouth daily.      . Rivaroxaban (XARELTO) 20 MG TABS tablet Take 1 tablet (20 mg total) by mouth daily.  90 tablet  3  . traMADol (ULTRAM) 50 MG tablet Take 50 mg by mouth every 6 (six) hours as needed for pain.      [name] zolpidem (AMBIEN) 10 MG tablet Take 10 mg by mouth at bedtime as needed for sleep.       No current facility-administered medications for this visit.    Allergies:    Allergies  Allergen Reactions  . Nsaids Other (See Comments)    significantly raises BP  . Codeine Nausea Only    Social History:  The patient  reports that she has quit smoking. She has never used smokeless  tobacco. She reports that she drinks about 2.4 ounces of alcohol per week. She reports that she does not use illicit drugs.   Family History:  The patient's family history includes Coronary artery disease in her father; Heart disease in her father; Hypertension in her father.   ROS:  Please see the history of present illness.      All other systems reviewed and negative.   PHYSICAL EXAM: VS:  BP 169/86  Pulse 75  Ht 5\' 4"  (1.626 m)  Wt 207 lb 1.9 oz (93.949 kg)  BMI 35.53 kg/m2 Well nourished, well developed, in no acute distress HEENT: normal Neck: no JVD Cardiac:  normal S1, S2; RRR; no murmur Lungs:  clear to auscultation bilaterally, no wheezing, rhonchi or rales Abd: soft, nontender, no hepatomegaly Ext: no edema Skin: warm and dry Neuro:  CNs 2-12 intact, no focal abnormalities noted      ASSESSMENT AND PLAN:  1. Atrial fibrillation - in NSR  - continue Xarelto/Diltiazem 2. HTN - BP elevated on exam today  - continue  Clonidine/HCTZ/vasotec/diltiazem  - I have asked her to check her BP daily for a week and call with the results 3. Chronic systemic anticoagulation  Followup with me in 6 months  Signed, Fransico Him, MD 12/06/2013 4:42 PM

## 2013-12-06 NOTE — Patient Instructions (Signed)
Your physician recommends that you continue on your current medications as directed. Please refer to the Current Medication list given to you today.  Your physician wants you to follow-up in: 6 months with Dr Turner You will receive a reminder letter in the mail two months in advance. If you don't receive a letter, please call our office to schedule the follow-up appointment.  

## 2013-12-21 NOTE — Progress Notes (Signed)
Surgery on 01/01/14.  Preop on 12/27/13 at 0930am.  Need orders in EPIC.  Thank You.

## 2013-12-22 ENCOUNTER — Telehealth: Payer: Self-pay | Admitting: General Surgery

## 2013-12-22 NOTE — Telephone Encounter (Signed)
For the most part BPs are good except for the one when she took her meds late.  Please fine out if she uses any table salt

## 2013-12-22 NOTE — Telephone Encounter (Signed)
Tracie asked me to take my blood pressure and get those readings to her. I am not signed into the portal there. I believe I should have done this the last time I was there but forgot. Please give these to her and ask her if she wants me to continue taking them every morning. Thanks, Elizabeth Wang   Date: 2013-12-13 08:51:04 SYS/DIA: 114/79 BPM: 77  Date: 2013-12-14 10:46:51 ( I usually take my blood pressure pills at 7 am and took them at 10am this day. I have seen this "rebound" effect before.  SYS/DIA: 149/113 BPM: 74  Date: 2013-12-15 08:45:21 SYS/DIA: 140/88 BPM: 73  Date: 2013-12-16 09:03:10 SYS/DIA: 146/93 BPM: 72  Date: 2013-12-17 10:12:07 SYS/DIA: 127/82 BPM: 68  Date: 2013-12-18 09:40:02 SYS/DIA: 107/76 BPM: 83

## 2013-12-22 NOTE — Progress Notes (Signed)
Surgery on 01/01/14.  prreop on 3/4 at 0930am.  Need order in EPIC.  Thank You.

## 2013-12-22 NOTE — Telephone Encounter (Signed)
Pt is aware. She trys to keep salt to a minimum. She stated she has been eating out a lot more then usual.

## 2013-12-25 ENCOUNTER — Encounter (HOSPITAL_COMMUNITY): Payer: Self-pay | Admitting: Pharmacy Technician

## 2013-12-26 ENCOUNTER — Other Ambulatory Visit: Payer: Self-pay | Admitting: Orthopedic Surgery

## 2013-12-26 ENCOUNTER — Other Ambulatory Visit (HOSPITAL_COMMUNITY): Payer: Self-pay | Admitting: *Deleted

## 2013-12-27 ENCOUNTER — Encounter (HOSPITAL_COMMUNITY): Payer: Self-pay

## 2013-12-27 ENCOUNTER — Ambulatory Visit (HOSPITAL_COMMUNITY)
Admission: RE | Admit: 2013-12-27 | Discharge: 2013-12-27 | Disposition: A | Discharge status: Home / Self Care | Payer: Medicare Other | Source: Ambulatory Visit | Attending: Orthopedic Surgery | Admitting: Orthopedic Surgery

## 2013-12-27 ENCOUNTER — Encounter (HOSPITAL_COMMUNITY)
Admission: RE | Admit: 2013-12-27 | Discharge: 2013-12-27 | Disposition: A | Discharge status: Home / Self Care | Payer: Medicare Other | Source: Ambulatory Visit | Attending: Orthopedic Surgery | Admitting: Orthopedic Surgery

## 2013-12-27 DIAGNOSIS — Z01812 Encounter for preprocedural laboratory examination: Secondary | ICD-10-CM | POA: Insufficient documentation

## 2013-12-27 DIAGNOSIS — Z01818 Encounter for other preprocedural examination: Secondary | ICD-10-CM | POA: Insufficient documentation

## 2013-12-27 DIAGNOSIS — Z01810 Encounter for preprocedural cardiovascular examination: Secondary | ICD-10-CM | POA: Insufficient documentation

## 2013-12-27 HISTORY — DX: Sleep apnea, unspecified: G47.30

## 2013-12-27 HISTORY — DX: Anesthesia of skin: R20.0

## 2013-12-27 HISTORY — DX: Unspecified osteoarthritis, unspecified site: M19.90

## 2013-12-27 HISTORY — DX: Other chondrocalcinosis, unspecified site: M11.20

## 2013-12-27 LAB — COMPREHENSIVE METABOLIC PANEL
ALBUMIN: 3.5 g/dL (ref 3.5–5.2)
ALT: 38 U/L — ABNORMAL HIGH (ref 0–35)
AST: 27 U/L (ref 0–37)
Alkaline Phosphatase: 57 U/L (ref 39–117)
BUN: 20 mg/dL (ref 6–23)
CO2: 26 mEq/L (ref 19–32)
CREATININE: 0.98 mg/dL (ref 0.50–1.10)
Calcium: 9.2 mg/dL (ref 8.4–10.5)
Chloride: 99 mEq/L (ref 96–112)
GFR calc non Af Amer: 58 mL/min — ABNORMAL LOW (ref >90)
GFR, EST AFRICAN AMERICAN: 68 mL/min — AB (ref >90)
GLUCOSE: 101 mg/dL — AB (ref 70–99)
Potassium: 4.1 mEq/L (ref 3.7–5.3)
Sodium: 137 mEq/L (ref 137–147)
TOTAL PROTEIN: 6.7 g/dL (ref 6.0–8.3)
Total Bilirubin: 0.5 mg/dL (ref 0.3–1.2)

## 2013-12-27 LAB — SURGICAL PCR SCREEN
MRSA, PCR: NEGATIVE
Staphylococcus aureus: POSITIVE — AB

## 2013-12-27 LAB — URINE MICROSCOPIC-ADD ON

## 2013-12-27 LAB — PROTIME-INR
INR: 0.94 (ref 0.00–1.49)
Prothrombin Time: 12.4 seconds (ref 11.6–15.2)

## 2013-12-27 LAB — URINALYSIS, ROUTINE W REFLEX MICROSCOPIC
BILIRUBIN URINE: NEGATIVE
Glucose, UA: NEGATIVE mg/dL
Ketones, ur: NEGATIVE mg/dL
NITRITE: NEGATIVE
PROTEIN: NEGATIVE mg/dL
SPECIFIC GRAVITY, URINE: 1.019 (ref 1.005–1.030)
Urobilinogen, UA: 0.2 mg/dL (ref 0.0–1.0)
pH: 7 (ref 5.0–8.0)

## 2013-12-27 LAB — CBC
HEMATOCRIT: 38.5 % (ref 36.0–46.0)
Hemoglobin: 12.9 g/dL (ref 12.0–15.0)
MCH: 32.3 pg (ref 26.0–34.0)
MCHC: 33.5 g/dL (ref 30.0–36.0)
MCV: 96.3 fL (ref 78.0–100.0)
Platelets: 192 10*3/uL (ref 150–400)
RBC: 4 MIL/uL (ref 3.87–5.11)
RDW: 13.1 % (ref 11.5–15.5)
WBC: 5.2 10*3/uL (ref 4.0–10.5)

## 2013-12-27 LAB — APTT: aPTT: 23 seconds — ABNORMAL LOW (ref 24–37)

## 2013-12-27 NOTE — Progress Notes (Signed)
12/27/13 1010  OBSTRUCTIVE SLEEP APNEA  Have you ever been diagnosed with sleep apnea through a sleep study? Yes  If yes, do you have and use a CPAP or BPAP machine every night? 0  Do you snore loudly (loud enough to be heard through closed doors)?  1  Do you often feel tired, fatigued, or sleepy during the daytime? 0  Has anyone observed you stop breathing during your sleep? 1  Do you have, or are you being treated for high blood pressure? 1  BMI more than 35 kg/m2? 1  Age over 22 years old? 1  Neck circumference greater than 40 cm/18 inches? 0  Gender: 0  Obstructive Sleep Apnea Score 5  Score 4 or greater  Results sent to PCP

## 2013-12-27 NOTE — Progress Notes (Signed)
+  PCR routed to Dr. Wynelle Link - Rx Mupuricin called to [facility] [phone]

## 2013-12-27 NOTE — Patient Instructions (Signed)
Davie Sagona  12/27/2013                            YOUR PROCEDURE IS SCHEDULED ON: 01/01/14               PLEASE REPORT TO SHORT STAY CENTER AT : 6:00 AM               CALL THIS NUMBER IF ANY PROBLEMS THE DAY OF SURGERY :               [phone]                                REMEMBER:   Do not eat food or drink liquids AFTER MIDNIGHT                 Take these medicines the morning of surgery with A SIP OF WATER:  CLONIDINE / COLCHICINE / CARDIZEM / LEVOTHYROXINE   Do not wear jewelry, make-up   Do not wear lotions, powders, or perfumes.   Do not shave legs or underarms 12 hrs. before surgery (men may shave face)  Do not bring valuables to the hospital.  Contacts, dentures or bridgework may not be worn into surgery.  Leave suitcase in the car. After surgery it may be brought to your room.  For patients admitted to the hospital more than one night, checkout time is            11:00 AM                                                       The day of discharge.   Patients discharged the day of surgery will not be allowed to drive home.            If going home same day of surgery, must have someone stay with you              FIRST 24 hrs at home and arrange for some one to drive you              home from hospital.    Special Instructions             Please read over the following fact sheets that you were given:               1. [place]               2. INCENTIVE SPIROMETER              3. MRSA INFORMATION SHEET                                                X_____________________________________________________________________        Failure to follow these instructions may result in cancellation of your surgery

## 2013-12-31 ENCOUNTER — Other Ambulatory Visit: Payer: Self-pay | Admitting: Orthopedic Surgery

## 2013-12-31 NOTE — Anesthesia Preprocedure Evaluation (Addendum)
Anesthesia Evaluation  Patient identified by MRN, date of birth, ID band Patient awake    Reviewed: Allergy & Precautions, H&P , NPO status , Patient's Chart, lab work & pertinent test results  Airway Mallampati: II TM Distance: >3 FB Neck ROM: full    Dental no notable dental hx.    Pulmonary sleep apnea , former smoker,  History PE 2014.  Takes Xarelto breath sounds clear to auscultation  Pulmonary exam normal       Cardiovascular hypertension, Pt. on medications + dysrhythmias Atrial Fibrillation Rhythm:regular Rate:Normal  ECG NSR, LVH   Neuro/Psych negative neurological ROS  negative psych ROS   GI/Hepatic negative GI ROS, Neg liver ROS,   Endo/Other  negative endocrine ROSHypothyroidism   Renal/GU negative Renal ROS  negative genitourinary   Musculoskeletal   Abdominal   Peds  Hematology negative hematology ROS (+)   Anesthesia Other Findings   Reproductive/Obstetrics negative OB ROS                         Anesthesia Physical Anesthesia Plan  ASA: III  Anesthesia Plan: Spinal   Post-op Pain Management:    Induction:   Airway Management Planned:   Additional Equipment:   Intra-op Plan:   Post-operative Plan:   Informed Consent: I have reviewed the patients History and Physical, chart, labs and discussed the procedure including the risks, benefits and alternatives for the proposed anesthesia with the patient or authorized representative who has indicated his/her understanding and acceptance.   Dental Advisory Given  Plan Discussed with: CRNA and Surgeon  Anesthesia Plan Comments:        Anesthesia Quick Evaluation

## 2013-12-31 NOTE — H&P (Signed)
Elizabeth Wang DOB: 1946-06-25 Married / Language: Cleophus Molt / Race: White Female  Date of Admission: 01-01-2014  Chief Complaint:  Right Knee Pain  History of Present Illnes The patient is a 68 year old female who comes in for a preoperative History and Physical. The patient is scheduled for a right total knee arthroplasty to be performed by Dr. Dione Plover. Aluisio, MD at [facility] on 01-01-2014. The patient is a 68 year old female who presents today for follow up of their knee. The patient is being followed for their bilateral knee pain and osteoarthritis. They are now many week(s) out from cort. injection into both knees. Symptoms reported today include: pain (still painful about the right with no relief and left knee she did receive relief ), aching and difficulty ambulating, while the patient does not report symptoms of: swelling. The following medication has been used for pain control: tramadol. The patient has reported improvement of their symptoms with: Cortisone injections (on the left ). Aurora is in for follow up after injections.We had to delay the surgery in November due to a blood clot that she developed. She is currently on Xarelto for that. She is not having any sequelae from the clot at this time. She says the right knee still bothers her significantly. She is eager to get the knees replaced and will start with the right knee at this time. They have been treated conservatively in the past for the above stated problem and despite conservative measures, they continue to have progressive pain and severe functional limitations and dysfunction. They have failed non-operative management including home exercise, medications, and injections. It is felt that they would benefit from undergoing total joint replacement. Risks and benefits of the procedure have been discussed with the patient and they elect to proceed with surgery. There are no active contraindications to surgery such  as ongoing infection or rapidly progressive neurological disease.  Allergies Codeine Derivatives. Nausea. **She is able to take Hydrocodone**  Problem List/Past Medical Primary osteoarthritis of both knees (715.16) Lumbar disc degeneration (722.52) Lumbar pain (724.2) High blood pressure Hypothyroidism Shingles. 2014 Asthma. 1978 Pneumonia. 2001 Sleep Apnea Atrial Fibrillation. Epsisode in conjunction with the Pulmonary Embolus - Sept 2014 Deep vein thrombosis. Sept 2014 Pulmonary Embolism. Sept 2014 Pseudogout   Family History Heart Disease. father Hypertension. father Osteoarthritis. father Cerebrovascular Accident. First Degree Relatives. mother   Social History Children. 2 Alcohol use. current drinker; drinks beer, wine and hard liquor; 5-7 per week Exercise. Exercises weekly; does other Current work status. working full time Tobacco / smoke exposure. no Pain Contract. no Illicit drug use. no Tobacco use. Former smoker. former smoker; smoke(d) less than 1/2 pack(s) per day; uses less than half 1/2 can(s) smokeless per week Drug/Alcohol Rehab (Previously). no Drug/Alcohol Rehab (Currently). no Living situation. live with spouse Number of flights of stairs before winded. 2-3 Marital status. married Post-Surgical Plans. Insurance underwriter. Living Will, Healthcare POA   Medication History Acetaminophen (500MG  Tablet, Oral) Active. CloNIDine HCl (0.3MG  Tablet, Oral) Active. Colchicine (0.6MG  Tablet, Oral) Active. Cardizem ( Oral) Specific dose unknown - Active. Enalapril-Hydrochlorothiazide ( Oral) Specific dose unknown - Active. Levothyroxine Sodium (125MCG Tablet, Oral) Active. Juice Plus Fibre ( Oral) Active. Potassium Gluconate (550MG  Tablet, Oral) Active. Xarelto (20MG  Tablet, Oral) Active. Ultram (50MG  Tablet, Oral) Active. Ambien (10MG  Tablet, Oral) Active.  Past Surgical History Tonsillectomy Arthroscopy  of Knee. left Breast Reconstruction. Date: 45. bilateral Cesarean Delivery. Date: 30. 1 time Abdominoplasty. Date: 63.  Review of Systems General:Present- Weight Loss (over 100 pounds over the past three years). Not Present- Chills, Fever, Night Sweats, Fatigue, Weight Gain and Memory Loss. Skin:Not Present- Hives, Itching, Rash, Eczema and Lesions. HEENT:Not Present- Tinnitus, Headache, Double Vision, Visual Loss, Hearing Loss and Dentures. Respiratory:Not Present- Shortness of breath with exertion, Shortness of breath at rest, Allergies, Coughing up blood and Chronic Cough. Cardiovascular:Not Present- Chest Pain, Racing/skipping heartbeats, Difficulty Breathing Lying Down, Murmur, Swelling and Palpitations. Gastrointestinal:Not Present- Bloody Stool, Heartburn, Abdominal Pain, Vomiting, Nausea, Constipation, Diarrhea, Difficulty Swallowing, Jaundice and Loss of appetitie. Female Genitourinary:Present- Urinating at Night. Not Present- Blood in Urine, Urinary frequency, Weak urinary stream, Discharge, Flank Pain, Incontinence, Painful Urination, Urgency and Urinary Retention. Musculoskeletal:Present- Muscle Weakness, Joint Pain and Morning Stiffness. Not Present- Muscle Pain, Joint Swelling, Back Pain and Spasms. Neurological:Not Present- Tremor, Dizziness, Blackout spells, Paralysis, Difficulty with balance and Weakness. Psychiatric:Not Present- Insomnia.    Vitals Weight: 205 lb Height: 63.5 in Weight was reported by patient. Height was reported by patient. Body Surface Area: 2.04 m Body Mass Index: 35.74 kg/m Pulse: 64 (Regular) Resp.: 14 (Unlabored) BP: 138/74 (Sitting, Left Arm, Standard)     Physical Exam The physical exam findings are as follows:   General Mental Status - Alert, cooperative and good historian. General Appearance- pleasant. Not in acute distress. Orientation- Oriented X3. Build & Nutrition- Well nourished and  Well developed.   Head and Neck Head- normocephalic, atraumatic . Neck Global Assessment- supple. no bruit auscultated on the right and no bruit auscultated on the left.   Eye Pupil- Bilateral- Regular and Round. Motion- Bilateral- EOMI.   Chest and Lung Exam Auscultation: Breath sounds:- clear at anterior chest wall and - clear at posterior chest wall. Adventitious sounds:- No Adventitious sounds.   Cardiovascular Auscultation:Rhythm- Regular rate and rhythm. Heart Sounds- S1 WNL and S2 WNL. Murmurs & Other Heart Sounds:Auscultation of the heart reveals - No Murmurs.   Abdomen Palpation/Percussion:Tenderness- Abdomen is non-tender to palpation. Rigidity (guarding)- Abdomen is soft. Auscultation:Auscultation of the abdomen reveals - Bowel sounds normal.   Female Genitourinary Not done, not pertinent to present illness  Musculoskeletal On examination, she has a severe valgus deformity of both knees with difficulty ambulating secondary to that. 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 The knees have full extension, further flexion to 120 degrees with crepitation throughout range of motion and pain. She has trace effusion. Sensation and circulation is intact.  RADIOGRAPHS: X-rays of the knees reveal severe valgus deformity with severe lateral compartment wear, severe patellofemoral degenerative changes and moderately severe medial compartment changes.   Assessment & Plan Primary osteoarthritis of both knees (715.16) Impression: Left Knee  Note: Plan is for a Right Total Knee Replacement by Dr. Aluisio.  Plan is to go to [name].  PCP - Dr. Abby Jarella  The patient will not receive TXA (tranexamic acid) due to: DVT, PE  Signed electronically by Breindel Collier L Faythe Heitzenrater, III PA-C  

## 2014-01-01 ENCOUNTER — Encounter (HOSPITAL_COMMUNITY): Payer: Medicare Other | Admitting: Anesthesiology

## 2014-01-01 ENCOUNTER — Encounter (HOSPITAL_COMMUNITY): Payer: Self-pay | Admitting: *Deleted

## 2014-01-01 ENCOUNTER — Inpatient Hospital Stay (HOSPITAL_COMMUNITY): Payer: Medicare Other | Admitting: Anesthesiology

## 2014-01-01 ENCOUNTER — Inpatient Hospital Stay (HOSPITAL_COMMUNITY)
Admission: RE | Admit: 2014-01-01 | Discharge: 2014-01-03 | DRG: 470 | Disposition: A | Discharge status: Skilled Nursing Facility | Payer: Medicare Other | Source: Ambulatory Visit | Attending: Orthopedic Surgery | Admitting: Orthopedic Surgery

## 2014-01-01 ENCOUNTER — Encounter (HOSPITAL_COMMUNITY)
Admission: RE | Disposition: A | Discharge status: Skilled Nursing Facility | Payer: Self-pay | Source: Ambulatory Visit | Attending: Orthopedic Surgery

## 2014-01-01 DIAGNOSIS — I82409 Acute embolism and thrombosis of unspecified deep veins of unspecified lower extremity: Secondary | ICD-10-CM

## 2014-01-01 DIAGNOSIS — E871 Hypo-osmolality and hyponatremia: Secondary | ICD-10-CM | POA: Diagnosis not present

## 2014-01-01 DIAGNOSIS — E039 Hypothyroidism, unspecified: Secondary | ICD-10-CM | POA: Diagnosis present

## 2014-01-01 DIAGNOSIS — Z6836 Body mass index (BMI) 36.0-36.9, adult: Secondary | ICD-10-CM

## 2014-01-01 DIAGNOSIS — Z7901 Long term (current) use of anticoagulants: Secondary | ICD-10-CM

## 2014-01-01 DIAGNOSIS — I4891 Unspecified atrial fibrillation: Secondary | ICD-10-CM | POA: Diagnosis present

## 2014-01-01 DIAGNOSIS — Z96651 Presence of right artificial knee joint: Secondary | ICD-10-CM

## 2014-01-01 DIAGNOSIS — D62 Acute posthemorrhagic anemia: Secondary | ICD-10-CM | POA: Diagnosis not present

## 2014-01-01 DIAGNOSIS — E8779 Other fluid overload: Secondary | ICD-10-CM | POA: Diagnosis not present

## 2014-01-01 DIAGNOSIS — I2782 Chronic pulmonary embolism: Secondary | ICD-10-CM | POA: Diagnosis present

## 2014-01-01 DIAGNOSIS — E785 Hyperlipidemia, unspecified: Secondary | ICD-10-CM | POA: Diagnosis present

## 2014-01-01 DIAGNOSIS — Z87891 Personal history of nicotine dependence: Secondary | ICD-10-CM

## 2014-01-01 DIAGNOSIS — I10 Essential (primary) hypertension: Secondary | ICD-10-CM | POA: Diagnosis present

## 2014-01-01 DIAGNOSIS — K219 Gastro-esophageal reflux disease without esophagitis: Secondary | ICD-10-CM | POA: Diagnosis present

## 2014-01-01 DIAGNOSIS — M179 Osteoarthritis of knee, unspecified: Secondary | ICD-10-CM | POA: Diagnosis present

## 2014-01-01 DIAGNOSIS — M1710 Unilateral primary osteoarthritis, unspecified knee: Principal | ICD-10-CM | POA: Diagnosis present

## 2014-01-01 DIAGNOSIS — G4730 Sleep apnea, unspecified: Secondary | ICD-10-CM | POA: Diagnosis present

## 2014-01-01 DIAGNOSIS — I2699 Other pulmonary embolism without acute cor pulmonale: Secondary | ICD-10-CM

## 2014-01-01 DIAGNOSIS — E876 Hypokalemia: Secondary | ICD-10-CM | POA: Diagnosis not present

## 2014-01-01 HISTORY — PX: TOTAL KNEE ARTHROPLASTY: SHX125

## 2014-01-01 LAB — TYPE AND SCREEN
ABO/RH(D): A POS
Antibody Screen: NEGATIVE

## 2014-01-01 LAB — ABO/RH: ABO/RH(D): A POS

## 2014-01-01 SURGERY — ARTHROPLASTY, KNEE, TOTAL
Anesthesia: Spinal | Site: Knee | Laterality: Right

## 2014-01-01 MED ORDER — METOCLOPRAMIDE HCL 10 MG PO TABS: 5.0000 mg | ORAL_TABLET | Freq: Three times a day (TID) | ORAL | Status: DC | PRN

## 2014-01-01 MED ORDER — MIDAZOLAM HCL 2 MG/2ML IJ SOLN
INTRAMUSCULAR | Status: AC
  Filled 2014-01-01: qty 2

## 2014-01-01 MED ORDER — LACTATED RINGERS IV SOLN: INTRAVENOUS | Status: DC

## 2014-01-01 MED ORDER — SODIUM CHLORIDE 0.9 % IJ SOLN
INTRAMUSCULAR | Status: DC | PRN
  Administered 2014-01-01: 30 mL

## 2014-01-01 MED ORDER — 0.9 % SODIUM CHLORIDE (POUR BTL) OPTIME
TOPICAL | Status: DC | PRN
  Administered 2014-01-01: 1000 mL

## 2014-01-01 MED ORDER — SODIUM CHLORIDE 0.9 % IV SOLN
INTRAVENOUS | Status: DC
  Administered 2014-01-01 – 2014-01-02 (×2): via INTRAVENOUS

## 2014-01-01 MED ORDER — PROPOFOL 10 MG/ML IV BOLUS
INTRAVENOUS | Status: AC
  Filled 2014-01-01: qty 20

## 2014-01-01 MED ORDER — PHENYLEPHRINE 40 MCG/ML (10ML) SYRINGE FOR IV PUSH (FOR BLOOD PRESSURE SUPPORT)
PREFILLED_SYRINGE | INTRAVENOUS | Status: AC
  Filled 2014-01-01: qty 10

## 2014-01-01 MED ORDER — METOCLOPRAMIDE HCL 5 MG/ML IJ SOLN: 5.0000 mg | Freq: Three times a day (TID) | INTRAMUSCULAR | Status: DC | PRN

## 2014-01-01 MED ORDER — LIDOCAINE HCL (CARDIAC) 20 MG/ML IV SOLN
INTRAVENOUS | Status: AC
  Filled 2014-01-01: qty 5

## 2014-01-01 MED ORDER — TRAMADOL HCL 50 MG PO TABS: 50.0000 mg | ORAL_TABLET | Freq: Four times a day (QID) | ORAL | Status: DC | PRN

## 2014-01-01 MED ORDER — ACETAMINOPHEN 325 MG PO TABS
650.0000 mg | ORAL_TABLET | Freq: Four times a day (QID) | ORAL | Status: DC | PRN
Start: 2014-01-01 — End: 2014-01-01

## 2014-01-01 MED ORDER — HYDROCHLOROTHIAZIDE 12.5 MG PO CAPS
12.5000 mg | ORAL_CAPSULE | Freq: Every day | ORAL | Status: DC
  Administered 2014-01-01 – 2014-01-03 (×3): 12.5 mg via ORAL
  Filled 2014-01-01 (×3): qty 1

## 2014-01-01 MED ORDER — PHENOL 1.4 % MT LIQD
1.0000 | OROMUCOSAL | Status: DC | PRN
  Filled 2014-01-01: qty 177

## 2014-01-01 MED ORDER — SODIUM CHLORIDE 0.9 % IR SOLN
Status: DC | PRN
  Administered 2014-01-01: 1000 mL

## 2014-01-01 MED ORDER — ONDANSETRON HCL 4 MG/2ML IJ SOLN
4.0000 mg | Freq: Four times a day (QID) | INTRAMUSCULAR | Status: DC | PRN
  Administered 2014-01-02: 4 mg via INTRAVENOUS
  Filled 2014-01-01: qty 2

## 2014-01-01 MED ORDER — MORPHINE SULFATE 2 MG/ML IJ SOLN: 1.0000 mg | INTRAMUSCULAR | Status: DC | PRN

## 2014-01-01 MED ORDER — CEFAZOLIN SODIUM-DEXTROSE 2-3 GM-% IV SOLR
INTRAVENOUS | Status: AC
  Filled 2014-01-01: qty 50

## 2014-01-01 MED ORDER — ENALAPRIL MALEATE 20 MG PO TABS
20.0000 mg | ORAL_TABLET | Freq: Two times a day (BID) | ORAL | Status: DC
  Filled 2014-01-01 (×3): qty 1

## 2014-01-01 MED ORDER — ZOLPIDEM TARTRATE 5 MG PO TABS: 5.0000 mg | ORAL_TABLET | Freq: Every evening | ORAL | Status: DC | PRN

## 2014-01-01 MED ORDER — METHOCARBAMOL 100 MG/ML IJ SOLN
500.0000 mg | Freq: Four times a day (QID) | INTRAVENOUS | Status: DC | PRN
  Administered 2014-01-01: 500 mg via INTRAVENOUS
  Filled 2014-01-01: qty 5

## 2014-01-01 MED ORDER — ACETAMINOPHEN 650 MG RE SUPP: 650.0000 mg | Freq: Four times a day (QID) | RECTAL | Status: DC | PRN

## 2014-01-01 MED ORDER — MIDAZOLAM HCL 5 MG/5ML IJ SOLN
INTRAMUSCULAR | Status: DC | PRN
  Administered 2014-01-01: 2 mg via INTRAVENOUS

## 2014-01-01 MED ORDER — SODIUM CHLORIDE 0.9 % IJ SOLN
INTRAMUSCULAR | Status: AC
  Filled 2014-01-01: qty 50

## 2014-01-01 MED ORDER — ACETAMINOPHEN 500 MG PO TABS
1000.0000 mg | ORAL_TABLET | Freq: Four times a day (QID) | ORAL | Status: AC
  Administered 2014-01-01 – 2014-01-02 (×4): 1000 mg via ORAL
  Filled 2014-01-01 (×4): qty 2

## 2014-01-01 MED ORDER — DEXAMETHASONE SODIUM PHOSPHATE 10 MG/ML IJ SOLN
10.0000 mg | Freq: Once | INTRAMUSCULAR | Status: AC
  Administered 2014-01-01: 10 mg via INTRAVENOUS

## 2014-01-01 MED ORDER — COLCHICINE 0.6 MG PO TABS
0.6000 mg | ORAL_TABLET | Freq: Two times a day (BID) | ORAL | Status: DC
  Administered 2014-01-01 – 2014-01-03 (×4): 0.6 mg via ORAL
  Filled 2014-01-01 (×5): qty 1

## 2014-01-01 MED ORDER — OXYCODONE HCL 5 MG PO TABS
5.0000 mg | ORAL_TABLET | ORAL | Status: DC | PRN
  Administered 2014-01-01 – 2014-01-02 (×9): 10 mg via ORAL
  Filled 2014-01-01 (×9): qty 2

## 2014-01-01 MED ORDER — DEXAMETHASONE SODIUM PHOSPHATE 10 MG/ML IJ SOLN
10.0000 mg | Freq: Every day | INTRAMUSCULAR | Status: AC
  Filled 2014-01-01: qty 1

## 2014-01-01 MED ORDER — DIPHENHYDRAMINE HCL 12.5 MG/5ML PO ELIX: 12.5000 mg | ORAL_SOLUTION | ORAL | Status: DC | PRN

## 2014-01-01 MED ORDER — MENTHOL 3 MG MT LOZG
1.0000 | LOZENGE | OROMUCOSAL | Status: DC | PRN
  Filled 2014-01-01: qty 9

## 2014-01-01 MED ORDER — ONDANSETRON HCL 4 MG PO TABS
4.0000 mg | ORAL_TABLET | Freq: Four times a day (QID) | ORAL | Status: DC | PRN
  Administered 2014-01-02 – 2014-01-03 (×3): 4 mg via ORAL
  Filled 2014-01-01 (×3): qty 1

## 2014-01-01 MED ORDER — SODIUM CHLORIDE 0.9 % IV SOLN: INTRAVENOUS | Status: DC

## 2014-01-01 MED ORDER — ACETAMINOPHEN 10 MG/ML IV SOLN
1000.0000 mg | Freq: Once | INTRAVENOUS | Status: AC
  Administered 2014-01-01: 1000 mg via INTRAVENOUS
  Filled 2014-01-01: qty 100

## 2014-01-01 MED ORDER — CHLORHEXIDINE GLUCONATE 4 % EX LIQD
60.0000 mL | Freq: Once | CUTANEOUS | Status: DC
Start: 2014-01-01 — End: 2014-01-01

## 2014-01-01 MED ORDER — DEXAMETHASONE SODIUM PHOSPHATE 10 MG/ML IJ SOLN
INTRAMUSCULAR | Status: AC
  Filled 2014-01-01: qty 1

## 2014-01-01 MED ORDER — HYDROMORPHONE HCL PF 1 MG/ML IJ SOLN: 0.2500 mg | INTRAMUSCULAR | Status: DC | PRN

## 2014-01-01 MED ORDER — FLEET ENEMA 7-19 GM/118ML RE ENEM: 1.0000 | ENEMA | Freq: Once | RECTAL | Status: AC | PRN

## 2014-01-01 MED ORDER — DOCUSATE SODIUM 100 MG PO CAPS
100.0000 mg | ORAL_CAPSULE | Freq: Two times a day (BID) | ORAL | Status: DC
  Administered 2014-01-01 – 2014-01-03 (×5): 100 mg via ORAL

## 2014-01-01 MED ORDER — LEVOTHYROXINE SODIUM 125 MCG PO TABS
125.0000 ug | ORAL_TABLET | Freq: Every day | ORAL | Status: DC
  Administered 2014-01-02 – 2014-01-03 (×2): 125 ug via ORAL
  Filled 2014-01-01 (×3): qty 1

## 2014-01-01 MED ORDER — BUPIVACAINE HCL 0.25 % IJ SOLN
INTRAMUSCULAR | Status: DC | PRN
  Administered 2014-01-01: 20 mL

## 2014-01-01 MED ORDER — PROPOFOL INFUSION 10 MG/ML OPTIME
INTRAVENOUS | Status: DC | PRN
  Administered 2014-01-01: 60 ug/kg/min via INTRAVENOUS

## 2014-01-01 MED ORDER — ENALAPRIL MALEATE 20 MG PO TABS
20.0000 mg | ORAL_TABLET | Freq: Two times a day (BID) | ORAL | Status: DC
  Filled 2014-01-01 (×2): qty 1

## 2014-01-01 MED ORDER — BUPIVACAINE HCL (PF) 0.25 % IJ SOLN
INTRAMUSCULAR | Status: AC
  Filled 2014-01-01: qty 30

## 2014-01-01 MED ORDER — RIVAROXABAN 10 MG PO TABS
10.0000 mg | ORAL_TABLET | Freq: Every day | ORAL | Status: DC
  Administered 2014-01-02 – 2014-01-03 (×2): 10 mg via ORAL
  Filled 2014-01-01 (×3): qty 1

## 2014-01-01 MED ORDER — METHOCARBAMOL 500 MG PO TABS
500.0000 mg | ORAL_TABLET | Freq: Four times a day (QID) | ORAL | Status: DC | PRN
  Administered 2014-01-01 – 2014-01-02 (×2): 500 mg via ORAL
  Filled 2014-01-01 (×2): qty 1

## 2014-01-01 MED ORDER — CEFAZOLIN SODIUM-DEXTROSE 2-3 GM-% IV SOLR
2.0000 g | INTRAVENOUS | Status: AC
  Administered 2014-01-01: 2 g via INTRAVENOUS

## 2014-01-01 MED ORDER — BUPIVACAINE LIPOSOME 1.3 % IJ SUSP
20.0000 mL | Freq: Once | INTRAMUSCULAR | Status: AC
  Administered 2014-01-01: 20 mL
  Filled 2014-01-01: qty 20

## 2014-01-01 MED ORDER — DILTIAZEM HCL ER COATED BEADS 240 MG PO CP24
240.0000 mg | ORAL_CAPSULE | Freq: Every morning | ORAL | Status: DC
  Administered 2014-01-02 – 2014-01-03 (×2): 240 mg via ORAL
  Filled 2014-01-01 (×2): qty 1

## 2014-01-01 MED ORDER — FENTANYL CITRATE 0.05 MG/ML IJ SOLN
INTRAMUSCULAR | Status: DC | PRN
  Administered 2014-01-01: 100 ug via INTRAVENOUS

## 2014-01-01 MED ORDER — ACETAMINOPHEN 325 MG PO TABS: 650.0000 mg | ORAL_TABLET | Freq: Four times a day (QID) | ORAL | Status: DC | PRN

## 2014-01-01 MED ORDER — DEXAMETHASONE 6 MG PO TABS
10.0000 mg | ORAL_TABLET | Freq: Every day | ORAL | Status: AC
  Administered 2014-01-02: 10 mg via ORAL
  Filled 2014-01-01: qty 1

## 2014-01-01 MED ORDER — LACTATED RINGERS IV SOLN
INTRAVENOUS | Status: DC | PRN
  Administered 2014-01-01 (×3): via INTRAVENOUS

## 2014-01-01 MED ORDER — BUPIVACAINE HCL (PF) 0.75 % IJ SOLN
INTRAMUSCULAR | Status: DC | PRN
  Administered 2014-01-01: 15 mg

## 2014-01-01 MED ORDER — POLYETHYLENE GLYCOL 3350 17 G PO PACK: 17.0000 g | PACK | Freq: Every day | ORAL | Status: DC | PRN

## 2014-01-01 MED ORDER — FENTANYL CITRATE 0.05 MG/ML IJ SOLN
INTRAMUSCULAR | Status: AC
  Filled 2014-01-01: qty 2

## 2014-01-01 MED ORDER — CEFAZOLIN SODIUM-DEXTROSE 2-3 GM-% IV SOLR
2.0000 g | Freq: Four times a day (QID) | INTRAVENOUS | Status: AC
  Administered 2014-01-01 (×2): 2 g via INTRAVENOUS
  Filled 2014-01-01 (×2): qty 50

## 2014-01-01 MED ORDER — PHENYLEPHRINE HCL 10 MG/ML IJ SOLN
INTRAMUSCULAR | Status: DC | PRN
  Administered 2014-01-01: 80 ug via INTRAVENOUS

## 2014-01-01 MED ORDER — DILTIAZEM HCL ER COATED BEADS 240 MG PO CP24
240.0000 mg | ORAL_CAPSULE | Freq: Every morning | ORAL | Status: DC
  Filled 2014-01-01: qty 1

## 2014-01-01 MED ORDER — CLONIDINE HCL 0.3 MG PO TABS
0.1500 mg | ORAL_TABLET | Freq: Two times a day (BID) | ORAL | Status: DC
  Administered 2014-01-01 – 2014-01-03 (×4): 0.15 mg via ORAL
  Filled 2014-01-01 (×5): qty 0.5

## 2014-01-01 MED ORDER — BISACODYL 10 MG RE SUPP
10.0000 mg | Freq: Every day | RECTAL | Status: DC | PRN
Start: 2014-01-01 — End: 2014-01-03

## 2014-01-01 SURGICAL SUPPLY — 56 items
BAG SPEC THK2 15X12 ZIP CLS (MISCELLANEOUS) ×1
BAG ZIPLOCK 12X15 (MISCELLANEOUS) ×2 IMPLANT
BANDAGE ELASTIC 6 VELCRO ST LF (GAUZE/BANDAGES/DRESSINGS) ×2 IMPLANT
BANDAGE ESMARK 6X9 LF (GAUZE/BANDAGES/DRESSINGS) ×1 IMPLANT
BLADE SAG 18X100X1.27 (BLADE) ×2 IMPLANT
BLADE SAW SGTL 11.0X1.19X90.0M (BLADE) ×2 IMPLANT
BNDG CMPR 9X6 STRL LF SNTH (GAUZE/BANDAGES/DRESSINGS) ×1
BNDG ESMARK 6X9 LF (GAUZE/BANDAGES/DRESSINGS) ×2
BOWL SMART MIX CTS (DISPOSABLE) ×2 IMPLANT
CAPT RP KNEE ×1 IMPLANT
CEMENT HV SMART SET (Cement) ×4 IMPLANT
CUFF TOURN SGL QUICK 34 (TOURNIQUET CUFF) ×2
CUFF TRNQT CYL 34X4X40X1 (TOURNIQUET CUFF) ×1 IMPLANT
DECANTER SPIKE VIAL GLASS SM (MISCELLANEOUS) ×2 IMPLANT
DRAPE EXTREMITY T 121X128X90 (DRAPE) ×2 IMPLANT
DRAPE POUCH INSTRU U-SHP 10X18 (DRAPES) ×2 IMPLANT
DRAPE U-SHAPE 47X51 STRL (DRAPES) ×2 IMPLANT
DRSG ADAPTIC 3X8 NADH LF (GAUZE/BANDAGES/DRESSINGS) ×2 IMPLANT
DURAPREP 26ML APPLICATOR (WOUND CARE) ×2 IMPLANT
ELECT REM PT RETURN 9FT ADLT (ELECTROSURGICAL) ×2
ELECTRODE REM PT RTRN 9FT ADLT (ELECTROSURGICAL) ×1 IMPLANT
EVACUATOR 1/8 PVC DRAIN (DRAIN) ×2 IMPLANT
FACESHIELD LNG OPTICON STERILE (SAFETY) ×10 IMPLANT
GLOVE BIO SURGEON STRL SZ7.5 (GLOVE) ×1 IMPLANT
GLOVE BIO SURGEON STRL SZ8 (GLOVE) ×2 IMPLANT
GLOVE BIOGEL PI IND STRL 8 (GLOVE) ×2 IMPLANT
GLOVE BIOGEL PI INDICATOR 8 (GLOVE) ×2
GOWN STRL REUS W/TWL 2XL LVL3 (GOWN DISPOSABLE) ×2 IMPLANT
GOWN STRL REUS W/TWL LRG LVL3 (GOWN DISPOSABLE) ×2 IMPLANT
GOWN STRL REUS W/TWL XL LVL3 (GOWN DISPOSABLE) ×1 IMPLANT
HANDPIECE INTERPULSE COAX TIP (DISPOSABLE) ×2
IMMOBILIZER KNEE 20 (SOFTGOODS) ×2 IMPLANT
KIT BASIN OR (CUSTOM PROCEDURE TRAY) ×2 IMPLANT
MANIFOLD NEPTUNE II (INSTRUMENTS) ×2 IMPLANT
NDL SAFETY ECLIPSE 18X1.5 (NEEDLE) ×2 IMPLANT
NEEDLE HYPO 18GX1.5 SHARP (NEEDLE) ×4
NS IRRIG 1000ML POUR BTL (IV SOLUTION) ×2 IMPLANT
PACK TOTAL JOINT (CUSTOM PROCEDURE TRAY) ×2 IMPLANT
PAD ABD 8X10 STRL (GAUZE/BANDAGES/DRESSINGS) ×2 IMPLANT
PADDING CAST COTTON 6X4 STRL (CAST SUPPLIES) ×6 IMPLANT
POSITIONER SURGICAL ARM (MISCELLANEOUS) ×2 IMPLANT
SET HNDPC FAN SPRY TIP SCT (DISPOSABLE) ×1 IMPLANT
SPONGE GAUZE 4X4 12PLY (GAUZE/BANDAGES/DRESSINGS) ×2 IMPLANT
STRIP CLOSURE SKIN 1/2X4 (GAUZE/BANDAGES/DRESSINGS) ×4 IMPLANT
SUCTION FRAZIER 12FR DISP (SUCTIONS) ×2 IMPLANT
SUT MNCRL AB 4-0 PS2 18 (SUTURE) ×2 IMPLANT
SUT VIC AB 2-0 CT1 27 (SUTURE) ×6
SUT VIC AB 2-0 CT1 TAPERPNT 27 (SUTURE) ×3 IMPLANT
SUT VLOC 180 0 24IN GS25 (SUTURE) ×2 IMPLANT
SYR 20CC LL (SYRINGE) ×2 IMPLANT
SYR 50ML LL SCALE MARK (SYRINGE) ×2 IMPLANT
TOWEL OR 17X26 10 PK STRL BLUE (TOWEL DISPOSABLE) ×2 IMPLANT
TOWEL OR NON WOVEN STRL DISP B (DISPOSABLE) ×1 IMPLANT
TRAY FOLEY CATH 14FRSI W/METER (CATHETERS) ×2 IMPLANT
WATER STERILE IRR 1500ML POUR (IV SOLUTION) ×2 IMPLANT
WRAP KNEE MAXI GEL POST OP (GAUZE/BANDAGES/DRESSINGS) ×2 IMPLANT

## 2014-01-01 NOTE — Transfer of Care (Signed)
Immediate Anesthesia Transfer of Care Note  Patient: Elizabeth Wang  Procedure(s) Performed: Procedure(s): TOTAL RIGHT KNEE ARTHROPLASTY (Right)  Patient Location: PACU  Anesthesia Type:Spinal  Level of Consciousness: awake, alert  and oriented  Airway & Oxygen Therapy: Patient Spontanous Breathing and Patient connected to face mask oxygen  Post-op Assessment: Report given to PACU RN and Post -op Vital signs reviewed and stable  Post vital signs: Reviewed and stable  Complications: No apparent anesthesia complications

## 2014-01-01 NOTE — Plan of Care (Signed)
Problem: Consults Goal: Diagnosis- Total Joint Replacement Right total knee     

## 2014-01-01 NOTE — Evaluation (Signed)
Physical Therapy Evaluation Patient Details Name: Elizabeth Wang MRN: [id number] DOB: [birth year] Today's Date: 01/01/2014 Time: 2952-8413 PT Time Calculation (min): 22 min  PT Assessment / Plan / Recommendation History of Present Illness  pt with R TKA  Clinical Impression  Pt will benefit from PT to address deficits below; pt limited by nausea today, resloved after seated in recliner    PT Assessment       Follow Up Recommendations  SNF    Does the patient have the potential to tolerate intense rehabilitation      Barriers to Discharge        Equipment Recommendations  None recommended by PT    Recommendations for Other Services     Frequency      Precautions / Restrictions Precautions Precautions: Knee Required Braces or Orthoses:  (I SLR today) Knee Immobilizer - Right: Discontinue once straight leg raise with < 10 degree lag Restrictions Other Position/Activity Restrictions: WBAT   Pertinent Vitals/Pain VSS      Mobility  Bed Mobility Overal bed mobility: Needs Assistance Bed Mobility: Supine to Sit Supine to sit: Min assist;Mod assist General bed mobility comments: incr time, cues for self assist adn technique Transfers Overall transfer level: Needs assistance Equipment used: Rolling walker (2 wheeled) Transfers: Sit to/from Stand Sit to Stand: Mod assist;From elevated surface General transfer comment: cues for hand placmement and RLE position Ambulation/Gait Ambulation/Gait assistance: Min assist Ambulation Distance (Feet): 20 Feet Assistive device: Rolling walker (2 wheeled) Gait Pattern/deviations: Step-to pattern;Antalgic General Gait Details: cues for sequence, RW position    Exercises Total Joint Exercises Ankle Circles/Pumps: AROM;Both;10 reps Quad Sets: 10 reps;Both;AROM   PT Diagnosis:    PT Problem List:   PT Treatment Interventions:       PT Goals(Current goals can be found in the care plan section) Acute Rehab PT Goals Patient Stated  Goal: I, rehab PT Goal Formulation: With patient Time For Goal Achievement: 01/08/14 Potential to Achieve Goals: Good  Visit Information  Last PT Received On: 01/01/14 Assistance Needed: +1 History of Present Illness: pt with R TKA       Prior Functioning  Home Living Family/patient expects to be discharged to:: Skilled nursing facility Living Arrangements: Spouse/significant other Additional Comments: [name] Prior Function Level of Independence: Independent Communication Communication: No difficulties    Cognition  Cognition Arousal/Alertness: Awake/alert Behavior During Therapy: WFL for tasks assessed/performed Overall Cognitive Status: Within Functional Limits for tasks assessed    Extremity/Trunk Assessment Upper Extremity Assessment Upper Extremity Assessment: Defer to OT evaluation Lower Extremity Assessment Lower Extremity Assessment: RLE deficits/detail RLE Deficits / Details: able to do I SLR, ankle WFL   Balance    End of Session PT - End of Session Equipment Utilized During Treatment: Gait belt Activity Tolerance: Patient tolerated treatment well Patient left: in chair;with call bell/phone within reach;with family/visitor present Nurse Communication: Mobility status CPM Left Knee CPM Left Knee: Off  GP     [facility] 01/01/2014, 5:28 PM

## 2014-01-01 NOTE — Progress Notes (Signed)
   CARE MANAGEMENT NOTE 01/01/2014  Patient:  Elizabeth Wang, Elizabeth Wang   Account Number:  [id number]  Date Initiated:  01/01/2014  Documentation initiated by:  [facility]  Subjective/Objective Assessment:   Right Total Knee Arthroplasty     Action/Plan:   lives at home with husband, preoperatively arranged with [facility] Go for [facility]. pt desires SNF   Anticipated DC Date:  01/04/2014   Anticipated DC Plan:  SKILLED NURSING FACILITY  In-house referral  Clinical Social Worker         [facility] Choice  HOME HEALTH   Choice offered to / List presented to:             Status of service:  Completed, signed off Medicare Important Message given?   (If response is "NO", the following Medicare IM given date fields will be blank) Date Medicare IM given:   Date Additional Medicare IM given:    Discharge Disposition:  [place]  Per UR Regulation:    If discussed at Long Length of Stay Meetings, dates discussed:    Comments:  01/01/2014 1500 NCM spoke to pt and she desires SNF-rehab. Referral to CSW for SNF placement. Jonnie Finner RN CCM Case Mgmt phone [phone]

## 2014-01-01 NOTE — H&P (View-Only) (Signed)
[name] DOB: 1946-06-25 Married / Language: Elizabeth Wang / Race: White Female  Date of Admission: 01-01-2014  Chief Complaint:  Right Knee Pain  History of Present Illnes The patient is a 68 year old female who comes in for a preoperative History and Physical. The patient is scheduled for a right total knee arthroplasty to be performed by Dr. Dione Plover. Aluisio, MD at [facility] on 01-01-2014. The patient is a 68 year old female who presents today for follow up of their knee. The patient is being followed for their bilateral knee pain and osteoarthritis. They are now many week(s) out from cort. injection into both knees. Symptoms reported today include: pain (still painful about the right with no relief and left knee she did receive relief ), aching and difficulty ambulating, while the patient does not report symptoms of: swelling. The following medication has been used for pain control: tramadol. The patient has reported improvement of their symptoms with: Cortisone injections (on the left ). [place] is in for follow up after injections.We had to delay the surgery in November due to a blood clot that she developed. She is currently on Xarelto for that. She is not having any sequelae from the clot at this time. She says the right knee still bothers her significantly. She is eager to get the knees replaced and will start with the right knee at this time. They have been treated conservatively in the past for the above stated problem and despite conservative measures, they continue to have progressive pain and severe functional limitations and dysfunction. They have failed non-operative management including home exercise, medications, and injections. It is felt that they would benefit from undergoing total joint replacement. Risks and benefits of the procedure have been discussed with the patient and they elect to proceed with surgery. There are no active contraindications to surgery such  as ongoing infection or rapidly progressive neurological disease.  Allergies Codeine Derivatives. Nausea. **She is able to take Hydrocodone**  Problem List/Past Medical Primary osteoarthritis of both knees (715.16) Lumbar disc degeneration (722.52) Lumbar pain (724.2) High blood pressure Hypothyroidism Shingles. 2014 Asthma. 1978 Pneumonia. 2001 Sleep Apnea Atrial Fibrillation. Epsisode in conjunction with the Pulmonary Embolus - Sept 2014 Deep vein thrombosis. Sept 2014 Pulmonary Embolism. Sept 2014 Pseudogout   Family History Heart Disease. father Hypertension. father Osteoarthritis. father Cerebrovascular Accident. First Degree Relatives. mother   Social History Children. 2 Alcohol use. current drinker; drinks beer, wine and hard liquor; 5-7 per week Exercise. Exercises weekly; does other Current work status. working full time Tobacco / smoke exposure. no Pain Contract. no Illicit drug use. no Tobacco use. Former smoker. former smoker; smoke(d) less than 1/2 pack(s) per day; uses less than half 1/2 can(s) smokeless per week Drug/Alcohol Rehab (Previously). no Drug/Alcohol Rehab (Currently). no Living situation. live with spouse Number of flights of stairs before winded. 2-3 Marital status. married Post-Surgical Plans. Insurance underwriter. Living Will, Healthcare POA   Medication History Acetaminophen (500MG  Tablet, Oral) Active. CloNIDine HCl (0.3MG  Tablet, Oral) Active. Colchicine (0.6MG  Tablet, Oral) Active. Cardizem ( Oral) Specific dose unknown - Active. Enalapril-Hydrochlorothiazide ( Oral) Specific dose unknown - Active. Levothyroxine Sodium (125MCG Tablet, Oral) Active. Juice Plus Fibre ( Oral) Active. Potassium Gluconate (550MG  Tablet, Oral) Active. Xarelto (20MG  Tablet, Oral) Active. Ultram (50MG  Tablet, Oral) Active. Ambien (10MG  Tablet, Oral) Active.  Past Surgical History Tonsillectomy Arthroscopy  of Knee. left Breast Reconstruction. Date: 45. bilateral Cesarean Delivery. Date: 30. 1 time Abdominoplasty. Date: 63.  Review of Systems General:Present- Weight Loss (over 100 pounds over the past three years). Not Present- Chills, Fever, Night Sweats, Fatigue, Weight Gain and Memory Loss. Skin:Not Present- Hives, Itching, Rash, Eczema and Lesions. HEENT:Not Present- Tinnitus, Headache, Double Vision, Visual Loss, Hearing Loss and Dentures. Respiratory:Not Present- Shortness of breath with exertion, Shortness of breath at rest, Allergies, Coughing up blood and Chronic Cough. Cardiovascular:Not Present- Chest Pain, Racing/skipping heartbeats, Difficulty Breathing Lying Down, Murmur, Swelling and Palpitations. Gastrointestinal:Not Present- Bloody Stool, Heartburn, Abdominal Pain, Vomiting, Nausea, Constipation, Diarrhea, Difficulty Swallowing, Jaundice and Loss of appetitie. Female Genitourinary:Present- Urinating at Night. Not Present- Blood in Urine, Urinary frequency, Weak urinary stream, Discharge, Flank Pain, Incontinence, Painful Urination, Urgency and Urinary Retention. Musculoskeletal:Present- Muscle Weakness, Joint Pain and Morning Stiffness. Not Present- Muscle Pain, Joint Swelling, Back Pain and Spasms. Neurological:Not Present- Tremor, Dizziness, Blackout spells, Paralysis, Difficulty with balance and Weakness. Psychiatric:Not Present- Insomnia.    Vitals Weight: 205 lb Height: 63.5 in Weight was reported by patient. Height was reported by patient. Body Surface Area: 2.04 m Body Mass Index: 35.74 kg/m Pulse: 64 (Regular) Resp.: 14 (Unlabored) BP: 138/74 (Sitting, Left Arm, Standard)     Physical Exam The physical exam findings are as follows:   General Mental Status - Alert, cooperative and good historian. General Appearance- pleasant. Not in acute distress. Orientation- Oriented X3. Build & Nutrition- Well nourished and  Well developed.   Head and Neck Head- normocephalic, atraumatic . Neck Global Assessment- supple. no bruit auscultated on the right and no bruit auscultated on the left.   Eye Pupil- Bilateral- Regular and Round. Motion- Bilateral- EOMI.   Chest and Lung Exam Auscultation: Breath sounds:- clear at anterior chest wall and - clear at posterior chest wall. Adventitious sounds:- No Adventitious sounds.   Cardiovascular Auscultation:Rhythm- Regular rate and rhythm. Heart Sounds- S1 WNL and S2 WNL. Murmurs & Other Heart Sounds:Auscultation of the heart reveals - No Murmurs.   Abdomen Palpation/Percussion:Tenderness- Abdomen is non-tender to palpation. Rigidity (guarding)- Abdomen is soft. Auscultation:Auscultation of the abdomen reveals - Bowel sounds normal.   Female Genitourinary Not done, not pertinent to present illness  Musculoskeletal On examination, she has a severe valgus deformity of both knees with difficulty ambulating secondary to that. 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 The knees have full extension, further flexion to 120 degrees with crepitation throughout range of motion and pain. She has trace effusion. Sensation and circulation is intact.  RADIOGRAPHS: X-rays of the knees reveal severe valgus deformity with severe lateral compartment wear, severe patellofemoral degenerative changes and moderately severe medial compartment changes.   Assessment & Plan Primary osteoarthritis of both knees (715.16) Impression: Left Knee  Note: Plan is for a Right Total Knee Replacement by Dr. Wynelle Link.  Plan is to go to [facility].  PCP - Dr. Jeoffrey Massed  The patient will not receive TXA (tranexamic acid) due to: DVT, PE  Signed electronically by Joelene Millin, III PA-C

## 2014-01-01 NOTE — Preoperative (Signed)
Beta Blockers   Reason not to administer Beta Blockers:Not Applicable 

## 2014-01-01 NOTE — Anesthesia Procedure Notes (Signed)
Spinal  Patient location during procedure: OR Start time: 01/01/2014 8:20 AM End time: 01/01/2014 8:25 AM Staffing CRNA/Resident: Harle Stanford R Performed by: resident/CRNA  Preanesthetic Checklist Completed: patient identified, site marked, surgical consent, pre-op evaluation, timeout performed, IV checked, risks and benefits discussed and monitors and equipment checked Spinal Block Patient position: sitting Prep: Betadine Patient monitoring: heart rate Approach: midline Location: L3-4 Injection technique: single-shot Needle Needle type: Sprotte  Needle length: 9 cm Needle insertion depth: 9 cm Assessment Sensory level: T4

## 2014-01-01 NOTE — Op Note (Signed)
Pre-operative diagnosis- Osteoarthritis Right knee(s)  Post-operative diagnosis- Osteoarthritis  Right knee(s)  Procedure-   Right Total Knee Arthroplasty  Surgeon- Dione Plover. Essance Gatti, MD  Assistant- Arlee Muslim, PA-C   Anesthesia-  Spinal   EBL- * No blood loss amount entered *   Drains Hemovac   Tourniquet time  Total Tourniquet Time Documented: Thigh (Right) - 43 minutes Total: Thigh (Right) - 43 minutes    Complications- None  Condition-PACU - hemodynamically stable.   Brief Clinical Note  Elizabeth Wang is a 68 y.o. year old female with end stage OA of her right knee with progressively worsening pain and dysfunction. She has constant pain, with activity and at rest and significant functional deficits with difficulties even with ADLs. She has had extensive non-op management including analgesics, injections of cortisone and viscosupplements, and home exercise program, but remains in significant pain with significant dysfunction.Radiographs show bone on bone arthritis lateral and patellofemoral with severe valgus deformity. She presents now for right Total Knee Arthroplasty.   Procedure in detail---       The patient is brought into the operating room and positioned supine on the operating table. After successful administration of Spinal anesthetic, a tourniquet is placed high on the Right thigh(s) and the lower extremity is prepped and draped in the usual sterile fashion. Time out is performed by the operating team and then the Right  lower extremity is wrapped in Esmarch, knee flexed and the tourniquet inflated to 300 mmHg.       A midline incision is made with a ten blade through the subcutaneous tissue to the level of the extensor mechanism. A fresh blade is used to make a lateral parapatellar arthrotomy due to the patients' valgus deformity. Soft tissue over the proximal lateral tibia is subperiosteally elevated to the joint line with a knife to the posterolateral corner but not  including the structures of the posterolateral corner. Soft tissue over the proximal medial tibia is elevated with attention being paid to avoiding the patellar tendon on the tibial tubercle. The patella is everted medially, knee flexed 90 degrees and the ACL and PCL are removed. Findings are bone on bone lateral and patellofemoral with massive global osteophytes  .       The drill is used to create a starting hole in the distal femur and the canal is thoroughly irrigated with sterile saline to remove the fatty contents. The 5 degree Right  valgus alignment guide is placed into the femoral canal and the distal femoral cutting block is pinned to remove 10  mm off the distal femur. Resection is made with an oscillating saw.      The tibia is subluxed forward and the menisci are removed. The extramedullary alignment guide is placed referencing proximally at the medial aspect of the tibial tubercle and distally along the second metatarsal axis and tibial crest. The block is pinned to remove 57mm off the more deficient lateral side. Resection is made with an oscillating saw. Size 4  is the most appropriate size for the tibia and the proximal tibia is prepared with the modular drill and keel punch for that size.      The femoral sizing guide is placed and size 5  is most appropriate. Rotation is marked off the epicondylar axis and confirmed by creating a rectangular flexion gap at 90 degrees. The size 5  cutting block is pinned in this rotation and the anterior, posterior and chamfer cuts are made with the oscillating saw. The  intercondylar block is then placed and that cut is made.      Trial size 4  tibial component, trial size 5  posterior stabilized femur and a 15  mm posterior stabilized rotating platform insert trial is placed. Full extension is achieved with excellent varus/valgus and   anterior/posterior balance throughout full range of motion. The patella is everted and thickness measured to be 22  mm. Free  hand resection is taken to 12 mm, a 38 template is placed, lug holes are drilled, trial patella is placed, and it tracks normally. Osteophytes are removed off the posterior femur with the trial in place. All trials are removed and the cut bone surfaces prepared with pulsatile lavage. Cement is mixed and once ready for implantation, the size 4  tibial implant, size 5 posterior stabilized femoral component, and the size 38  patella are cemented in place and the patella is held with the clamp. The trial insert is placed and the knee held in full extension. The Exparel (20 ml mixed with 30 ml saline) and then 20 ml of .25% Bupivicaine is injected into the extensor mechanism, posterior capsule, medial and lateral gutters and subcutaneous tissues. All extruded cement is removed and once the cement is hard the permanent 15  mm posterior stabilized rotating platform insert is placed into the tibial tray.      The wound is copiously irrigated with saline solution and the tourniquet is released for a total   tourniquet time of 42  minutes. Bleeding is identified and controlled with electrocautery. The extensor mechanism is closed with interrupted #1 PDS leaving open a small area from the superior to inferior pole of the patella to serve as a mini lateral release. Flexion against gravity is 140  degrees and the patella tracks normally. Subcutaneous tissue is closed with 2.0 vicryl and subcuticular with running 4.0 Monocryl.The incision is cleaned and dried and steri-strips and a bulky sterile dressing are applied. The limb is placed into a knee immobilizer and the patient is awakened and transported to recovery in stable condition.      Please note that a surgical assistant was a medical necessity for this procedure in order to perform it in a safe and expeditious manner. Surgical assistant was necessary to retract the ligaments and vital neurovascular structures to prevent injury to them and also necessary for proper  positioning of the limb to allow for anatomic placement of the prosthesis.    Dione Plover Elizabeth Siebels, MD    01/01/2014, 9:41 AM

## 2014-01-01 NOTE — Anesthesia Postprocedure Evaluation (Signed)
  Anesthesia Post-op Note  Patient: Elizabeth Wang  Procedure(s) Performed: Procedure(s) (LRB): TOTAL RIGHT KNEE ARTHROPLASTY (Right)  Patient Location: PACU  Anesthesia Type: Spinal  Level of Consciousness: awake and alert   Airway and Oxygen Therapy: Patient Spontanous Breathing  Post-op Pain: mild  Post-op Assessment: Post-op Vital signs reviewed, Patient's Cardiovascular Status Stable, Respiratory Function Stable, Patent Airway and No signs of Nausea or vomiting  Last Vitals:  Filed Vitals:   01/01/14 1433  BP: 136/68  Pulse: 70  Temp: 36.4 C  Resp: 16    Post-op Vital Signs: stable   Complications: No apparent anesthesia complications

## 2014-01-01 NOTE — Interval H&P Note (Signed)
History and Physical Interval Note:  01/01/2014 7:26 AM  Elizabeth Wang  has presented today for surgery, with the diagnosis of OA RIGHT KNEE   The various methods of treatment have been discussed with the patient and family. After consideration of risks, benefits and other options for treatment, the patient has consented to  Procedure(s): TOTAL RIGHT KNEE ARTHROPLASTY (Right) as a surgical intervention .  The patient's history has been reviewed, patient examined, no change in status, stable for surgery.  I have reviewed the patient's chart and labs.  Questions were answered to the patient's satisfaction.     [name]

## 2014-01-01 NOTE — Progress Notes (Signed)
Utilization review completed.  

## 2014-01-02 ENCOUNTER — Encounter (HOSPITAL_COMMUNITY): Payer: Medicare Other

## 2014-01-02 ENCOUNTER — Other Ambulatory Visit: Payer: Medicare Other

## 2014-01-02 DIAGNOSIS — K219 Gastro-esophageal reflux disease without esophagitis: Secondary | ICD-10-CM | POA: Diagnosis present

## 2014-01-02 DIAGNOSIS — E785 Hyperlipidemia, unspecified: Secondary | ICD-10-CM | POA: Diagnosis present

## 2014-01-02 DIAGNOSIS — E039 Hypothyroidism, unspecified: Secondary | ICD-10-CM | POA: Diagnosis present

## 2014-01-02 DIAGNOSIS — D62 Acute posthemorrhagic anemia: Secondary | ICD-10-CM | POA: Diagnosis not present

## 2014-01-02 LAB — CBC
HCT: 27.6 % — ABNORMAL LOW (ref 36.0–46.0)
Hemoglobin: 9.8 g/dL — ABNORMAL LOW (ref 12.0–15.0)
MCH: 33.2 pg (ref 26.0–34.0)
MCHC: 35.5 g/dL (ref 30.0–36.0)
MCV: 93.6 fL (ref 78.0–100.0)
PLATELETS: 149 10*3/uL — AB (ref 150–400)
RBC: 2.95 MIL/uL — AB (ref 3.87–5.11)
RDW: 12.8 % (ref 11.5–15.5)
WBC: 13.8 10*3/uL — ABNORMAL HIGH (ref 4.0–10.5)

## 2014-01-02 LAB — BASIC METABOLIC PANEL
BUN: 14 mg/dL (ref 6–23)
CO2: 26 mEq/L (ref 19–32)
Calcium: 8.4 mg/dL (ref 8.4–10.5)
Chloride: 99 mEq/L (ref 96–112)
Creatinine, Ser: 0.83 mg/dL (ref 0.50–1.10)
GFR calc Af Amer: 83 mL/min — ABNORMAL LOW (ref >90)
GFR calc non Af Amer: 71 mL/min — ABNORMAL LOW (ref >90)
Glucose, Bld: 161 mg/dL — ABNORMAL HIGH (ref 70–99)
Potassium: 3.8 mEq/L (ref 3.7–5.3)
SODIUM: 137 meq/L (ref 137–147)

## 2014-01-02 MED ORDER — HYDROCODONE-ACETAMINOPHEN 5-325 MG PO TABS
1.0000 | ORAL_TABLET | ORAL | Status: DC | PRN
  Administered 2014-01-03 (×2): 2 via ORAL
  Filled 2014-01-02 (×2): qty 2

## 2014-01-02 MED ORDER — CYCLOBENZAPRINE HCL 10 MG PO TABS
10.0000 mg | ORAL_TABLET | Freq: Three times a day (TID) | ORAL | Status: DC | PRN
Start: 2014-01-02 — End: 2014-01-03

## 2014-01-02 NOTE — Evaluation (Signed)
Occupational Therapy Evaluation Patient Details Name: Elizabeth Wang MRN: [id number] DOB: 06-Mar-1946 Today's Date: 01/02/2014 Time: 1115-1140 OT Time Calculation (min): 25 min  OT Assessment / Plan / Recommendation History of present illness pt with R TKA   Clinical Impression   Pt is progressing well and is very motivated. She will benefit from acute OT to progress ADL independence for next venue of care.     OT Assessment  Patient needs continued OT Services    Follow Up Recommendations  SNF;Supervision/Assistance - 24 hour    Barriers to Discharge      Equipment Recommendations  None recommended by OT (defer to SNF)    Recommendations for Other Services    Frequency  Min 2X/week    Precautions / Restrictions Precautions Precautions: Knee Required Braces or Orthoses:  (SLR) Knee Immobilizer - Right: Discontinue once straight leg raise with < 10 degree lag (I SLR ) Restrictions Weight Bearing Restrictions: No Other Position/Activity Restrictions: WBAT   Pertinent Vitals/Pain Pt states no pain    ADL  Eating/Feeding: Independent Where Assessed - Eating/Feeding: Chair Grooming: Wash/dry hands;Min guard Where Assessed - Grooming: Unsupported standing Upper Body Bathing: Chest;Right arm;Left arm;Abdomen;Set up Where Assessed - Upper Body Bathing: Unsupported sitting Lower Body Bathing: Minimal assistance Where Assessed - Lower Body Bathing: Supported sit to stand Upper Body Dressing: Set up Where Assessed - Upper Body Dressing: Unsupported sitting Lower Body Dressing: Minimal assistance Where Assessed - Lower Body Dressing: Supported sit to stand Toilet Transfer: Minimal assistance Science writer: Raised toilet seat with arms (or 3-in-1 over toilet) Toileting - Clothing Manipulation and Hygiene: Simulated;Minimal assistance Where Assessed - Best boy and Hygiene: Sit to stand from 3-in-1 or toilet Equipment Used: Rolling walker ADL  Comments: Reviewed sequence for LB dressing and wrote down for pt. Pt is very motivated. Min cues for not stepping outside of walker but did better as session progressed.     OT Diagnosis: Generalized weakness  OT Problem List: Decreased strength;Decreased knowledge of use of DME or AE OT Treatment Interventions: Self-care/ADL training;DME and/or AE instruction;Therapeutic activities;Patient/family education   OT Goals(Current goals can be found in the care plan section) Acute Rehab OT Goals Patient Stated Goal: I, rehab OT Goal Formulation: With patient Time For Goal Achievement: 01/09/14 Potential to Achieve Goals: Good  Visit Information  Last OT Received On: 01/02/14 Assistance Needed: +1 History of Present Illness: pt with R TKA       Prior Functioning     Home Living Family/patient expects to be discharged to:: Skilled nursing facility Living Arrangements: Spouse/significant other Additional Comments: [name] Prior Function Level of Independence: Independent Communication Communication: No difficulties         Vision/Perception     Cognition  Cognition Arousal/Alertness: Awake/alert Behavior During Therapy: WFL for tasks assessed/performed Overall Cognitive Status: Within Functional Limits for tasks assessed    Extremity/Trunk Assessment Upper Extremity Assessment Upper Extremity Assessment: Overall WFL for tasks assessed     Mobility  Transfers Overall transfer level: Needs assistance Equipment used: Rolling walker (2 wheeled) Transfers: Sit to/from Stand Sit to Stand: Min guard General transfer comment: cues for hand placmement and RLE position        Balance     End of Session OT - End of Session Equipment Utilized During Treatment: Rolling walker Activity Tolerance: Patient tolerated treatment well Patient left: in chair;with call bell/phone within reach  GO     Jules Schick [phone] 01/02/2014, 11:51 AM

## 2014-01-02 NOTE — Progress Notes (Signed)
Physical Therapy Treatment Patient Details Name: Berneta Sconyers MRN: [id number] DOB: 01-23-1946 Today's Date: 01/02/2014 Time: 4098-1191 PT Time Calculation (min): 29 min  PT Assessment / Plan / Recommendation  History of Present Illness pt with R TKA   PT Comments   Amb. Without KI. Plans snf tomorrow.  Follow Up Recommendations  SNF     Does the patient have the potential to tolerate intense rehabilitation     Barriers to Discharge        Equipment Recommendations  None recommended by PT    Recommendations for Other Services    Frequency 7X/week   Progress towards PT Goals Progress towards PT goals: Progressing toward goals  Plan Current plan remains appropriate    Precautions / Restrictions Precautions Precautions: Knee   Pertinent Vitals/Pain < 3 pain    Mobility  Bed Mobility Overal bed mobility: Needs Assistance Bed Mobility: Supine to Sit;Sit to Supine Supine to sit: Min guard Sit to supine: Min guard General bed mobility comments: incr time, cues for self assist and technique Transfers Overall transfer level: Needs assistance Equipment used: Rolling walker (2 wheeled) Transfers: Sit to/from Stand Sit to Stand: Supervision General transfer comment: cues for hand placmement and RLE position Ambulation/Gait Ambulation/Gait assistance: Min guard Ambulation Distance (Feet): 50 Feet Assistive device: Rolling walker (2 wheeled) Gait Pattern/deviations: Step-to pattern;Antalgic General Gait Details: cues for sequence, RW position    Exercises Total Joint Exercises Ankle Circles/Pumps: AROM;Both;10 reps;Supine Quad Sets: 10 reps;Both;AROM;Supine Heel Slides: AROM;AAROM;Right;10 reps;Supine Hip ABduction/ADduction: AAROM;AROM;Right;15 reps;Supine Straight Leg Raises: AROM;Right;15 reps;Supine   PT Diagnosis:    PT Problem List:   PT Treatment Interventions:     PT Goals (current goals can now be found in the care plan section)    Visit Information  Last  PT Received On: 01/02/14 Assistance Needed: +1 History of Present Illness: pt with R TKA    Subjective Data      Cognition  Cognition Arousal/Alertness: Awake/alert    Balance     End of Session PT - End of Session Activity Tolerance: Patient tolerated treatment well Patient left: in bed;with call bell/phone within reach Nurse Communication: Mobility status CPM Left Knee CPM Left Knee: On   GP     Claretha Cooper 01/02/2014, 5:45 PM

## 2014-01-02 NOTE — Progress Notes (Signed)
   Subjective: 1 Day Post-Op Procedure(s) (LRB): TOTAL RIGHT KNEE ARTHROPLASTY (Right) Patient reports pain as mild.   Patient seen in rounds with Dr. Wynelle Link.  She is doing well this morning. She already walked yesterday, the day of surgery. Patient is well, and has had no acute complaints or problems We will resume therapy today.  Plan is to go [facility] after hospital stay.  Objective: Vital signs in last 24 hours: Temp:  [97.3 F (36.3 C)-98.8 F (37.1 C)] 98.1 F (36.7 C) (03/10 0200) Pulse Rate:  [56-100] 86 (03/10 0200) Resp:  [12-19] 18 (03/10 0343) BP: (121-138)/(55-97) 121/76 mmHg (03/10 0200) SpO2:  [99 %-100 %] 99 % (03/10 0343) Weight:  [94.348 kg (208 lb)] 94.348 kg (208 lb) (03/09 1130)  Intake/Output from previous day:  Intake/Output Summary (Last 24 hours) at 01/02/14 0929 Last data filed at 01/02/14 0917  Gross per 24 hour  Intake   4045 ml  Output   3530 ml  Net    515 ml    Intake/Output this shift: UOP 900 since MN +2415   Labs:  Recent Labs  01/02/14 0415  HGB 9.8*    Recent Labs  01/02/14 0415  WBC 13.8*  RBC 2.95*  HCT 27.6*  PLT 149*    Recent Labs  01/02/14 0415  NA 137  K 3.8  CL 99  CO2 26  BUN 14  CREATININE 0.83  GLUCOSE 161*  CALCIUM 8.4   No results found for this basename: LABPT, INR,  in the last 72 hours  EXAM General - Patient is Alert, Appropriate Extremity - Neurovascular intact Sensation intact distally Dressing - dressing C/D/I Motor Function - intact, moving foot and toes well on exam.  Hemovac pulled without difficulty.  Past Medical History  Diagnosis Date  . Hypertension   . Clotting disorder     DVT/PE  . Obesity   . Hypothyroidism   . Esophageal reflux   . Dyslipidemia   . Diverticulosis   . Acute pulmonary embolism 06/2013    now on xarelto follows w hematology  . Dysrhythmia     AFIB 2014  . Numbness     TOES  . Arthritis   . Pseudogout   . Sleep apnea     "not bad enough to  use c- pap"    Assessment/Plan: 1 Day Post-Op Procedure(s) (LRB): TOTAL RIGHT KNEE ARTHROPLASTY (Right) Principal Problem:   OA (osteoarthritis) of knee Active Problems:   Essential hypertension, benign - clonidine, cardizem, vasotec (on hold temporarily - ACEI), HCTZ   Postoperative anemia due to acute blood loss - add Iron   Unspecified hypothyroidism - resumed levothyroxine   Esophageal reflux - no RX, monitor for symptons, add Mylanta PRN   Other and unspecified hyperlipidemia - no RX meds Positive Volume Overload - diurese with HCTX home medication  Estimated body mass index is 36.85 kg/(m^2) as calculated from the following:   Height as of this encounter: 5\' 3"  (1.6 m).   Weight as of this encounter: 94.348 kg (208 lb). Advance diet Up with therapy Discharge to SNF  DVT Prophylaxis - Xarelto 10 mg daily for two days and then resume her preop dosing of 20 mg Weight-Bearing as tolerated to right leg D/C O2 and Pulse OX and try on Room [address]  Mickel Crow 01/02/2014, 9:29 AM

## 2014-01-02 NOTE — Discharge Instructions (Addendum)
° °Dr. Frank Aluisio °Total Joint Specialist °[facility] °[address] °[place], [place] [address] °[phone] ° °TOTAL KNEE REPLACEMENT POSTOPERATIVE DIRECTIONS ° ° ° °Knee Rehabilitation, Guidelines Following Surgery  °Results after knee surgery are often greatly improved when you follow the exercise, range of motion and muscle strengthening exercises prescribed by your doctor. Safety measures are also important to protect the knee from further injury. Any time any of these exercises cause you to have increased pain or swelling in your knee joint, decrease the amount until you are comfortable again and slowly increase them. If you have problems or questions, call your caregiver or physical therapist for advice.  ° °HOME CARE INSTRUCTIONS  °Remove items at home which could result in a fall. This includes throw rugs or furniture in walking pathways.  °Continue medications as instructed at time of discharge. °You may have some home medications which will be placed on hold until you complete the course of blood thinner medication.  °You may start showering once you are discharged home but do not submerge the incision under water. Just pat the incision dry and apply a dry gauze dressing on daily. °Walk with walker as instructed.  °You may resume a sexual relationship in one month or when given the OK by  your doctor.  °· Use walker as long as suggested by your caregivers. °· Avoid periods of inactivity such as sitting longer than an hour when not asleep. This helps prevent blood clots.  °You may put full weight on your legs and walk as much as is comfortable.  °You may return to work once you are cleared by your doctor.  °Do not drive a car for 6 weeks or until released by you surgeon.  °· Do not drive while taking narcotics.  °Wear the elastic stockings for three weeks following surgery during the day but you may remove then at night. °Make sure you keep all of your appointments after your  operation with all of your doctors and caregivers. You should call the office at the above phone number and make an appointment for approximately two weeks after the date of your surgery. °Change the dressing daily and reapply a dry dressing each time. °Please pick up a stool softener and laxative for home use as long as you are requiring pain medications. °· Continue to use ice on the knee for pain and swelling from surgery. You may notice swelling that will progress down to the foot and ankle.  This is normal after surgery.  Elevate the leg when you are not up walking on it.   °It is important for you to complete the blood thinner medication as prescribed by your doctor. °· Continue to use the breathing machine which will help keep your temperature down.  It is common for your temperature to cycle up and down following surgery, especially at night when you are not up moving around and exerting yourself.  The breathing machine keeps your lungs expanded and your temperature down. ° °RANGE OF MOTION AND STRENGTHENING EXERCISES  °Rehabilitation of the knee is important following a knee injury or an operation. After just a few days of immobilization, the muscles of the thigh which control the knee become weakened and shrink (atrophy). Knee exercises are designed to build up the tone and strength of the thigh muscles and to improve knee motion. Often times heat used for twenty to thirty minutes before working out will loosen up your tissues and help with improving the   range of motion but do not use heat for the first two weeks following surgery. These exercises can be done on a training (exercise) mat, on the floor, on a table or on a bed. Use what ever works the best and is most comfortable for you Knee exercises include:  Leg Lifts - While your knee is still immobilized in a splint or cast, you can do straight leg raises. Lift the leg to 60 degrees, hold for 3 sec, and slowly lower the leg. Repeat 10-20 times 2-3  times daily. Perform this exercise against resistance later as your knee gets better.  Quad and Hamstring Sets - Tighten up the muscle on the front of the thigh (Quad) and hold for 5-10 sec. Repeat this 10-20 times hourly. Hamstring sets are done by pushing the foot backward against an object and holding for 5-10 sec. Repeat as with quad sets.  A rehabilitation program following serious knee injuries can speed recovery and prevent re-injury in the future due to weakened muscles. Contact your doctor or a physical therapist for more information on knee rehabilitation.   SKILLED REHAB INSTRUCTIONS: If the patient is transferred to a skilled rehab facility following release from the hospital, a list of the current medications will be sent to the facility for the patient to continue.  When discharged from the skilled rehab facility, please have the facility set up the patient's [place] prior to being released. Also, the skilled facility will be responsible for providing the patient with their medications at time of release from the facility to include their pain medication, the muscle relaxants, and their blood thinner medication. If the patient is still at the rehab facility at time of the two week follow up appointment, the skilled rehab facility will also need to assist the patient in arranging follow up appointment in our office and any transportation needs.  MAKE SURE YOU:  Understand these instructions.  Will watch your condition.  Will get help right away if you are not doing well or get worse.    Pick up stool softner and laxative for home. Do not submerge incision under water. May shower. Continue to use ice for pain and swelling from surgery.  Take one more dose of Xarelto 10 mg on Wednesday 01/03/2014. Take Xarelto 20 mg for three more weeks starting on Thursday 01/04/2014, then discontinue Xarelto. Once the patient has completed the blood thinner regimen, then start  taking Baby 81 mg Aspirin 2 tablets daily.  When discharged from the skilled rehab facility, please have the facility set up the patient's [place] prior to being released.  Also provide the patient with their medications at time of release from the facility to include their pain medication, the muscle relaxants, and their blood thinner medication.  If the patient is still at the rehab facility at time of follow up appointment, please also assist the patient in arranging follow up appointment in our office and any transportation needs.   Information on my medicine - XARELTO (Rivaroxaban)  This medication education was reviewed with me or my healthcare representative as part of my discharge preparation.  The pharmacist that spoke with me during my hospital stay was:  Absher, Julieta Bellini, RPH  Why was Xarelto prescribed for you? Xarelto was prescribed for you to reduce the risk of blood clots forming after orthopedic surgery. The medical term for these abnormal blood clots is venous thromboembolism (VTE).  What do you need to know about xarelto ?  Take your Xarelto ONCE DAILY at the same time every day. You may take it either with or without food.  If you have difficulty swallowing the tablet whole, you may crush it and mix in applesauce just prior to taking your dose.  Take Xarelto exactly as prescribed by your doctor and DO NOT stop taking Xarelto without talking to the doctor who prescribed the medication.  Stopping without other VTE prevention medication to take the place of Xarelto may increase your risk of developing a clot.  After discharge, you should have regular check-up appointments with your healthcare provider that is prescribing your Xarelto.    What do you do if you miss a dose? If you miss a dose, take it as soon as you remember on the same day then continue your regularly scheduled once daily regimen the next day. Do not take two doses of Xarelto on  the same day.   Important Safety Information A possible side effect of Xarelto is bleeding. You should call your healthcare provider right away if you experience any of the following:   Bleeding from an injury or your nose that does not stop.   Unusual colored urine (red or dark brown) or unusual colored stools (red or black).   Unusual bruising for unknown reasons.   A serious fall or if you hit your head (even if there is no bleeding).  Some medicines may interact with Xarelto and might increase your risk of bleeding while on Xarelto. To help avoid this, consult your healthcare provider or pharmacist prior to using any new prescription or non-prescription medications, including herbals, vitamins, non-steroidal anti-inflammatory drugs (NSAIDs) and supplements.  This website has more information on Xarelto: [email or web address]

## 2014-01-02 NOTE — Progress Notes (Addendum)
Physical Therapy Treatment Patient Details Name: Elizabeth Wang MRN: [id number] DOB: [birth year] Today's Date: 01/02/2014 Time: 3903-0092 PT Time Calculation (min): 31  PT Assessment / Plan / Recommendation  History of Present Illness pt with R TKA   PT Comments   Pt progressing  Follow Up Recommendations  SNF     Does the patient have the potential to tolerate intense rehabilitation     Barriers to Discharge        Equipment Recommendations  None recommended by PT    Recommendations for Other Services    Frequency 7X/week   Progress towards PT Goals Progress towards PT goals: Progressing toward goals  Plan Current plan remains appropriate    Precautions / Restrictions Precautions Precautions: Knee Knee Immobilizer - Right: Discontinue once straight leg raise with < 10 degree lag (I SLR ) Restrictions Other Position/Activity Restrictions: WBAT   Pertinent Vitals/Pain Pain controlled    Mobility  Bed Mobility Overal bed mobility: Needs Assistance Bed Mobility: Supine to Sit Supine to sit: Supervision General bed mobility comments: incr time, cues for self assist and technique Transfers Overall transfer level: Needs assistance Equipment used: Rolling walker (2 wheeled) Transfers: Sit to/from Stand Sit to Stand: Min assist General transfer comment: cues for hand placmement and RLE position Ambulation/Gait Ambulation/Gait assistance: Min assist Ambulation Distance (Feet): 86 Feet Assistive device: Rolling walker (2 wheeled) Gait Pattern/deviations: Step-to pattern;Antalgic General Gait Details: cues for sequence, RW position    Exercises Total Joint Exercises Ankle Circles/Pumps: AROM;Both;10 reps Quad Sets: 10 reps;Both;AROM Heel Slides: AROM;AAROM;Right;10 reps Hip ABduction/ADduction: AAROM;AROM;Right;15 reps Straight Leg Raises: AROM;Right;15 reps Goniometric ROM: 80   PT Diagnosis:    PT Problem List:   PT Treatment Interventions:     PT Goals (current  goals can now be found in the care plan section) Acute Rehab PT Goals Patient Stated Goal: I, rehab PT Goal Formulation: With patient Time For Goal Achievement: 01/08/14 Potential to Achieve Goals: Good  Visit Information  Last PT Received On: 01/02/14 Assistance Needed: +1 History of Present Illness: pt with R TKA    Subjective Data  Subjective: I feel good Patient Stated Goal: I, rehab   Cognition  Cognition Arousal/Alertness: Awake/alert Behavior During Therapy: WFL for tasks assessed/performed Overall Cognitive Status: Within Functional Limits for tasks assessed    Balance     End of Session PT - End of Session Activity Tolerance: Patient tolerated treatment well Patient left: in chair;with call bell/phone within reach;with family/visitor present Nurse Communication: Mobility status   GP     [facility] 01/02/2014, 11:07 AM

## 2014-01-02 NOTE — Progress Notes (Signed)
Clinical Social Work Department BRIEF PSYCHOSOCIAL ASSESSMENT 01/02/2014  Patient:  Elizabeth Wang, Elizabeth Wang     Account Number:  [id number]     Admit date:  01/01/2014  Clinical Social Worker:  Lacie Scotts  Date/Time:  01/02/2014 11:06 AM  Referred by:  Physician  Date Referred:  01/02/2014 Referred for  SNF Placement   Other Referral:   Interview type:  Patient Other interview type:    PSYCHOSOCIAL DATA Living Status:  HUSBAND Admitted from facility:   Level of care:   Primary support name:  Aidee Latimore Primary support relationship to patient:  SPOUSE Degree of support available:   supportive    CURRENT CONCERNS Current Concerns  Post-Acute Placement   Other Concerns:    SOCIAL WORK ASSESSMENT / PLAN Pt is a 68 yr old female living at home prior to hospitalization. CSW met with pt to assist with d/c planning. Pt has made prior arrangements to have ST Rehab at [facility] following hospital d/c. CSW has contacted SNF and d/c plans have been confirmed. CSW will request [facility] authorization prior to d/c.   Assessment/plan status:  Psychosocial Support/Ongoing Assessment of Needs Other assessment/ plan:   Information/referral to community resources:   Insurance coverage for SNF and ambulance transport reviewed.    PATIENT'S/FAMILY'S RESPONSE TO PLAN OF CARE: Pt states she slept well after surgery and pain is being controlled. She is motivated and looking forward to rehab.    Werner Lean LCSW [phone]

## 2014-01-02 NOTE — Progress Notes (Signed)
Clinical Social Work Department CLINICAL SOCIAL WORK PLACEMENT NOTE 01/02/2014  Patient:  Elizabeth Wang, Elizabeth Wang  Account Number:  [id number] Admit date:  01/01/2014  Clinical Social Worker:  Werner Lean, LCSW  Date/time:  01/02/2014 11:13 AM  Clinical Social Work is seeking post-discharge placement for this patient at the following level of care:   SKILLED NURSING   (*CSW will update this form in Epic as items are completed)     Patient/family provided with [facility] Department of Clinical Social Work's list of facilities offering this level of care within the geographic area requested by the patient (or if unable, by the patient's family).  01/02/2014  Patient/family informed of their freedom to choose among providers that offer the needed level of care, that participate in Medicare, Medicaid or managed care program needed by the patient, have an available bed and are willing to accept the patient.    Patient/family informed of [facility]' ownership interest in [facility], as well as of the fact that they are under no obligation to receive care at this facility.  PASARR submitted to EDS on 01/02/2014 PASARR number received from EDS on 01/02/2014  FL2 transmitted to all facilities in geographic area requested by pt/family on  01/02/2014 FL2 transmitted to all facilities within larger geographic area on   Patient informed that his/her managed care company has contracts with or will negotiate with  certain facilities, including the following:     Patient/family informed of bed offers received:  01/02/2014 Patient chooses bed at [facility] Physician recommends and patient chooses bed at    Patient to be transferred to [facility] on   Patient to be transferred to facility by   The following physician request were entered in Epic:   Additional Comments:   Werner Lean LCSW [phone]

## 2014-01-03 DIAGNOSIS — E871 Hypo-osmolality and hyponatremia: Secondary | ICD-10-CM | POA: Diagnosis not present

## 2014-01-03 DIAGNOSIS — E876 Hypokalemia: Secondary | ICD-10-CM | POA: Diagnosis not present

## 2014-01-03 LAB — CBC
HCT: 27.4 % — ABNORMAL LOW (ref 36.0–46.0)
Hemoglobin: 9.4 g/dL — ABNORMAL LOW (ref 12.0–15.0)
MCH: 32 pg (ref 26.0–34.0)
MCHC: 34.3 g/dL (ref 30.0–36.0)
MCV: 93.2 fL (ref 78.0–100.0)
PLATELETS: 152 10*3/uL (ref 150–400)
RBC: 2.94 MIL/uL — AB (ref 3.87–5.11)
RDW: 13.1 % (ref 11.5–15.5)
WBC: 12.4 10*3/uL — AB (ref 4.0–10.5)

## 2014-01-03 LAB — BASIC METABOLIC PANEL
BUN: 15 mg/dL (ref 6–23)
CHLORIDE: 93 meq/L — AB (ref 96–112)
CO2: 28 meq/L (ref 19–32)
Calcium: 8.8 mg/dL (ref 8.4–10.5)
Creatinine, Ser: 0.74 mg/dL (ref 0.50–1.10)
GFR calc Af Amer: >90 mL/min (ref >90)
GFR calc non Af Amer: 86 mL/min — ABNORMAL LOW (ref >90)
Glucose, Bld: 132 mg/dL — ABNORMAL HIGH (ref 70–99)
POTASSIUM: 3.4 meq/L — AB (ref 3.7–5.3)
Sodium: 134 mEq/L — ABNORMAL LOW (ref 137–147)

## 2014-01-03 MED ORDER — RIVAROXABAN 10 MG PO TABS: 10.0000 mg | ORAL_TABLET | Freq: Every day | ORAL | Status: DC

## 2014-01-03 MED ORDER — DSS 100 MG PO CAPS: 100.0000 mg | ORAL_CAPSULE | Freq: Two times a day (BID) | ORAL | Status: DC

## 2014-01-03 MED ORDER — TRAMADOL HCL 50 MG PO TABS: 50.0000 mg | ORAL_TABLET | Freq: Four times a day (QID) | ORAL | Status: DC | PRN

## 2014-01-03 MED ORDER — HYDROCODONE-ACETAMINOPHEN 5-325 MG PO TABS: 1.0000 | ORAL_TABLET | ORAL | Status: DC | PRN

## 2014-01-03 MED ORDER — METOCLOPRAMIDE HCL 5 MG PO TABS: 5.0000 mg | ORAL_TABLET | Freq: Three times a day (TID) | ORAL | Status: DC | PRN

## 2014-01-03 MED ORDER — POLYETHYLENE GLYCOL 3350 17 G PO PACK: 17.0000 g | PACK | Freq: Every day | ORAL | Status: DC | PRN

## 2014-01-03 MED ORDER — RIVAROXABAN 20 MG PO TABS: 20.0000 mg | ORAL_TABLET | Freq: Every day | ORAL | Status: DC

## 2014-01-03 MED ORDER — ONDANSETRON HCL 4 MG PO TABS: 4.0000 mg | ORAL_TABLET | Freq: Four times a day (QID) | ORAL | Status: DC | PRN

## 2014-01-03 MED ORDER — BISACODYL 10 MG RE SUPP: 10.0000 mg | Freq: Every day | RECTAL | Status: DC | PRN

## 2014-01-03 MED ORDER — CYCLOBENZAPRINE HCL 10 MG PO TABS: 10.0000 mg | ORAL_TABLET | Freq: Three times a day (TID) | ORAL | Status: DC | PRN

## 2014-01-03 NOTE — Progress Notes (Signed)
   Subjective: 2 Days Post-Op Procedure(s) (LRB): TOTAL RIGHT KNEE ARTHROPLASTY (Right) Patient reports pain as mild.   Patient seen in rounds with Dr. Wynelle Link. Patient is well, and has had no acute complaints or problems Patient is ready to go to the SNF today since the insurance will allow transfer.  Objective: Vital signs in last 24 hours: Temp:  [97.5 F (36.4 C)-98.4 F (36.9 C)] 97.5 F (36.4 C) (03/11 0620) Pulse Rate:  [75-76] 76 (03/11 0620) Resp:  [16-18] 16 (03/11 0620) BP: (142-160)/(81-87) 142/81 mmHg (03/11 0620) SpO2:  [95 %-99 %] 99 % (03/11 0620)  Intake/Output from previous day:  Intake/Output Summary (Last 24 hours) at 01/03/14 1000 Last data filed at 01/03/14 0931  Gross per 24 hour  Intake    840 ml  Output      0 ml  Net    840 ml    Intake/Output this shift: Total I/O In: 240 [P.O.:240] Out: -   Labs:  Recent Labs  01/02/14 0415 01/03/14 0420  HGB 9.8* 9.4*    Recent Labs  01/02/14 0415 01/03/14 0420  WBC 13.8* 12.4*  RBC 2.95* 2.94*  HCT 27.6* 27.4*  PLT 149* 152    Recent Labs  01/02/14 0415 01/03/14 0420  NA 137 134*  K 3.8 3.4*  CL 99 93*  CO2 26 28  BUN 14 15  CREATININE 0.83 0.74  GLUCOSE 161* 132*  CALCIUM 8.4 8.8   No results found for this basename: LABPT, INR,  in the last 72 hours  EXAM: General - Patient is Alert, Appropriate and Oriented Extremity - Neurovascular intact Sensation intact distally Incision - clean, dry, no drainage Motor Function - intact, moving foot and toes well on exam.   Assessment/Plan: 2 Days Post-Op Procedure(s) (LRB): TOTAL RIGHT KNEE ARTHROPLASTY (Right) Procedure(s) (LRB): TOTAL RIGHT KNEE ARTHROPLASTY (Right) Past Medical History  Diagnosis Date  . Hypertension   . Clotting disorder     DVT/PE  . Obesity   . Hypothyroidism   . Esophageal reflux   . Dyslipidemia   . Diverticulosis   . Acute pulmonary embolism 06/2013    now on xarelto follows w hematology  .  Dysrhythmia     AFIB 2014  . Numbness     TOES  . Arthritis   . Pseudogout   . Sleep apnea     "not bad enough to use c- pap"   Principal Problem:   OA (osteoarthritis) of knee Active Problems:   Essential hypertension, benign   Postoperative anemia due to acute blood loss   Unspecified hypothyroidism   Esophageal reflux   Other and unspecified hyperlipidemia  Estimated body mass index is 36.85 kg/(m^2) as calculated from the following:   Height as of this encounter: 5\' 3"  (1.6 m).   Weight as of this encounter: 94.348 kg (208 lb). Up with therapy Discharge to SNF Diet - Cardiac diet Follow up - in 2 weeks Activity - WBAT Disposition - Skilled nursing facility Condition Upon Discharge - Good D/C Meds - See DC Summary DVT Prophylaxis - Xarelto 10 mg for one more day and then switch back to her preop dose of 20 mg daily for three weeks and then Aspirin 81 mg 2 tablets daily.  Navon Kotowski 01/03/2014, 10:00 AM

## 2014-01-03 NOTE — Progress Notes (Signed)
Physical Therapy Treatment Patient Details Name: Elizabeth Wang MRN: [id number] DOB: 05-May-1946 Today's Date: 01/03/2014 Time: 7209-4709 PT Time Calculation (min): 34 min  PT Assessment / Plan / Recommendation  History of Present Illness pt with R TKA   PT Comments   POD # 2 assisted pt OOB to amb limited distance.  Performed all supine TKR TE's followed by ICE.  Pt progressing slowly and plans to D/C to SNF for ST Rehab.   Follow Up Recommendations  SNF     Does the patient have the potential to tolerate intense rehabilitation     Barriers to Discharge        Equipment Recommendations  None recommended by PT    Recommendations for Other Services    Frequency     Progress towards PT Goals Progress towards PT goals: Progressing toward goals  Plan Current plan remains appropriate    Precautions / Restrictions Precautions Precautions: Knee Restrictions Weight Bearing Restrictions: No Other Position/Activity Restrictions: WBAT   Pertinent Vitals/Pain C/o 3/10 Pre medicated ICE    Mobility  Bed Mobility Overal bed mobility: Needs Assistance Supine to sit: Supervision;Min guard Sit to supine: Supervision;Min guard General bed mobility comments: incr time, cues for self assist and technique Transfers Overall transfer level: Needs assistance Equipment used: Rolling walker (2 wheeled) Sit to Stand: Min guard;Supervision General transfer comment: cues for hand placmement and RLE position Ambulation/Gait Ambulation/Gait assistance: Min guard Ambulation Distance (Feet): 65 Feet Assistive device: Rolling walker (2 wheeled) Gait velocity: decreased General Gait Details: cues for sequence, RW position, posture    Exercises   Total Knee Replacement TE's 10 reps B LE ankle pumps 10 reps towel squeezes 10 reps knee presses 10 reps heel slides  10 reps SAQ's 10 reps SLR's 10 reps ABD Followed by ICE    PT Goals (current goals can now be found in the care plan section)     Visit Information  Last PT Received On: 01/03/14 Assistance Needed: +1 History of Present Illness: pt with R TKA    Subjective Data      Cognition       Balance     End of Session PT - End of Session Equipment Utilized During Treatment: Gait belt Activity Tolerance: Patient tolerated treatment well Patient left: in bed;with call bell/phone within reach Nurse Communication: Mobility status   Rica Koyanagi  PTA [facility]  Acute  Rehab Pager      [phone]

## 2014-01-03 NOTE — Discharge Summary (Signed)
Physician Discharge Summary   Patient ID: Elizabeth Wang MRN: [id number] DOB/AGE: [birth year] 68 y.o.  Admit date: 01/01/2014 Discharge date: 01-03-2014  Primary Diagnosis:  Osteoarthritis Right knee(s)  Admission Diagnoses:  Past Medical History  Diagnosis Date  . Hypertension   . Clotting disorder     DVT/PE  . Obesity   . Hypothyroidism   . Esophageal reflux   . Dyslipidemia   . Diverticulosis   . Acute pulmonary embolism 06/2013    now on xarelto follows w hematology  . Dysrhythmia     AFIB 2014  . Numbness     TOES  . Arthritis   . Pseudogout   . Sleep apnea     "not bad enough to use c- pap"   Discharge Diagnoses:   Principal Problem:   OA (osteoarthritis) of knee Active Problems:   Essential hypertension, benign   Postoperative anemia due to acute blood loss   Unspecified hypothyroidism   Esophageal reflux   Other and unspecified hyperlipidemia   Hypokalemia   Hyponatremia  Estimated body mass index is 36.85 kg/(m^2) as calculated from the following:   Height as of this encounter: 5' 3"  (1.6 m).   Weight as of this encounter: 94.348 kg (208 lb).  Procedure:  Procedure(s) (LRB): TOTAL RIGHT KNEE ARTHROPLASTY (Right)   Consults: None  HPI: Elizabeth Wang is a 68 y.o. year old female with end stage OA of her right knee with progressively worsening pain and dysfunction. She has constant pain, with activity and at rest and significant functional deficits with difficulties even with ADLs. She has had extensive non-op management including analgesics, injections of cortisone and viscosupplements, and home exercise program, but remains in significant pain with significant dysfunction.Radiographs show bone on bone arthritis lateral and patellofemoral with severe valgus deformity. She presents now for right Total Knee Arthroplasty.   Laboratory Data: Admission on 01/01/2014  Component Date Value Ref Range Status  . ABO/RH(D) 01/01/2014 A POS   Final  . Antibody Screen  01/01/2014 NEG   Final  . Sample Expiration 01/01/2014 01/04/2014   Final  . ABO/RH(D) 01/01/2014 A POS   Final  . WBC 01/02/2014 13.8* 4.0 - 10.5 K/uL Final  . RBC 01/02/2014 2.95* 3.87 - 5.11 MIL/uL Final  . Hemoglobin 01/02/2014 9.8* 12.0 - 15.0 g/dL Final  . HCT 01/02/2014 27.6* 36.0 - 46.0 % Final  . MCV 01/02/2014 93.6  78.0 - 100.0 fL Final  . MCH 01/02/2014 33.2  26.0 - 34.0 pg Final  . MCHC 01/02/2014 35.5  30.0 - 36.0 g/dL Final  . RDW 01/02/2014 12.8  11.5 - 15.5 % Final  . Platelets 01/02/2014 149* 150 - 400 K/uL Final  . Sodium 01/02/2014 137  137 - 147 mEq/L Final  . Potassium 01/02/2014 3.8  3.7 - 5.3 mEq/L Final  . Chloride 01/02/2014 99  96 - 112 mEq/L Final  . CO2 01/02/2014 26  19 - 32 mEq/L Final  . Glucose, Bld 01/02/2014 161* 70 - 99 mg/dL Final  . BUN 01/02/2014 14  6 - 23 mg/dL Final  . Creatinine, Ser 01/02/2014 0.83  0.50 - 1.10 mg/dL Final  . Calcium 01/02/2014 8.4  8.4 - 10.5 mg/dL Final  . GFR calc non Af Amer 01/02/2014 71* >90 mL/min Final  . GFR calc Af Amer 01/02/2014 83* >90 mL/min Final   Comment: (NOTE)  The eGFR has been calculated using the CKD EPI equation.                          This calculation has not been validated in all clinical situations.                          eGFR's persistently <90 mL/min signify possible Chronic Kidney                          Disease.  . WBC 01/03/2014 12.4* 4.0 - 10.5 K/uL Final  . RBC 01/03/2014 2.94* 3.87 - 5.11 MIL/uL Final  . Hemoglobin 01/03/2014 9.4* 12.0 - 15.0 g/dL Final  . HCT 01/03/2014 27.4* 36.0 - 46.0 % Final  . MCV 01/03/2014 93.2  78.0 - 100.0 fL Final  . MCH 01/03/2014 32.0  26.0 - 34.0 pg Final  . MCHC 01/03/2014 34.3  30.0 - 36.0 g/dL Final  . RDW 01/03/2014 13.1  11.5 - 15.5 % Final  . Platelets 01/03/2014 152  150 - 400 K/uL Final  . Sodium 01/03/2014 134* 137 - 147 mEq/L Final  . Potassium 01/03/2014 3.4* 3.7 - 5.3 mEq/L Final  . Chloride 01/03/2014 93* 96 - 112  mEq/L Final  . CO2 01/03/2014 28  19 - 32 mEq/L Final  . Glucose, Bld 01/03/2014 132* 70 - 99 mg/dL Final  . BUN 01/03/2014 15  6 - 23 mg/dL Final  . Creatinine, Ser 01/03/2014 0.74  0.50 - 1.10 mg/dL Final  . Calcium 01/03/2014 8.8  8.4 - 10.5 mg/dL Final  . GFR calc non Af Amer 01/03/2014 86* >90 mL/min Final  . GFR calc Af Amer 01/03/2014 >90  >90 mL/min Final   Comment: (NOTE)                          The eGFR has been calculated using the CKD EPI equation.                          This calculation has not been validated in all clinical situations.                          eGFR's persistently <90 mL/min signify possible Chronic Kidney                          Disease.  Hospital Outpatient Visit on 12/27/2013  Component Date Value Ref Range Status  . MRSA, PCR 12/27/2013 NEGATIVE  NEGATIVE Final  . Staphylococcus aureus 12/27/2013 POSITIVE* NEGATIVE Final   Comment:                                 The Xpert SA Assay (FDA                          approved for NASAL specimens                          in patients over 7 years of age),                          is  one component of                          a comprehensive surveillance                          program.  Test performance has                          been validated by [facility] for patients greater                          than or equal to 33 year old.                          It is not intended                          to diagnose infection nor to                          guide or monitor treatment.  [name] aPTT 12/27/2013 23* 24 - 37 seconds Final  . WBC 12/27/2013 5.2  4.0 - 10.5 K/uL Final  . RBC 12/27/2013 4.00  3.87 - 5.11 MIL/uL Final  . Hemoglobin 12/27/2013 12.9  12.0 - 15.0 g/dL Final  . HCT 12/27/2013 38.5  36.0 - 46.0 % Final  . MCV 12/27/2013 96.3  78.0 - 100.0 fL Final  . MCH 12/27/2013 32.3  26.0 - 34.0 pg Final  . MCHC 12/27/2013 33.5  30.0 - 36.0 g/dL Final  . RDW 12/27/2013  13.1  11.5 - 15.5 % Final  . Platelets 12/27/2013 192  150 - 400 K/uL Final  . Sodium 12/27/2013 137  137 - 147 mEq/L Final  . Potassium 12/27/2013 4.1  3.7 - 5.3 mEq/L Final  . Chloride 12/27/2013 99  96 - 112 mEq/L Final  . CO2 12/27/2013 26  19 - 32 mEq/L Final  . Glucose, Bld 12/27/2013 101* 70 - 99 mg/dL Final  . BUN 12/27/2013 20  6 - 23 mg/dL Final  . Creatinine, Ser 12/27/2013 0.98  0.50 - 1.10 mg/dL Final  . Calcium 12/27/2013 9.2  8.4 - 10.5 mg/dL Final  . Total Protein 12/27/2013 6.7  6.0 - 8.3 g/dL Final  . Albumin 12/27/2013 3.5  3.5 - 5.2 g/dL Final  . AST 12/27/2013 27  0 - 37 U/L Final  . ALT 12/27/2013 38* 0 - 35 U/L Final  . Alkaline Phosphatase 12/27/2013 57  39 - 117 U/L Final  . Total Bilirubin 12/27/2013 0.5  0.3 - 1.2 mg/dL Final  . GFR calc non Af Amer 12/27/2013 58* >90 mL/min Final  . GFR calc Af Amer 12/27/2013 68* >90 mL/min Final   Comment: (NOTE)                          The eGFR has been calculated using the CKD EPI equation.                          This calculation has not been validated in  all clinical situations.                          eGFR's persistently <90 mL/min signify possible Chronic Kidney                          Disease.  [name] Prothrombin Time 12/27/2013 12.4  11.6 - 15.2 seconds Final  . INR 12/27/2013 0.94  0.00 - 1.49 Final  . Color, Urine 12/27/2013 YELLOW  YELLOW Final  . APPearance 12/27/2013 CLEAR  CLEAR Final  . Specific Gravity, Urine 12/27/2013 1.019  1.005 - 1.030 Final  . pH 12/27/2013 7.0  5.0 - 8.0 Final  . Glucose, UA 12/27/2013 NEGATIVE  NEGATIVE mg/dL Final  . Hgb urine dipstick 12/27/2013 SMALL* NEGATIVE Final  . Bilirubin Urine 12/27/2013 NEGATIVE  NEGATIVE Final  . Ketones, ur 12/27/2013 NEGATIVE  NEGATIVE mg/dL Final  . Protein, ur 12/27/2013 NEGATIVE  NEGATIVE mg/dL Final  . Urobilinogen, UA 12/27/2013 0.2  0.0 - 1.0 mg/dL Final  . Nitrite 12/27/2013 NEGATIVE  NEGATIVE Final  . Leukocytes, UA 12/27/2013 SMALL*  NEGATIVE Final  . Squamous Epithelial / LPF 12/27/2013 RARE  RARE Final  . WBC, UA 12/27/2013 3-6  <3 WBC/hpf Final  . RBC / HPF 12/27/2013 3-6  <3 RBC/hpf Final  . Bacteria, UA 12/27/2013 RARE  RARE Final  Appointment on 11/28/2013  Component Date Value Ref Range Status  . D-Dimer, Quant 11/28/2013 0.28  0.00 - 0.48 ug/mL-FEU Final   Comment: At the inhouse established cutoff value of 0.48 ug/mL FEU, thismethology has been documented in the literature to have a sensitivityand negative predictive value of at least 98-99%.  The test resultshould be correlated with an assessment of the clinical                           probability ofDVT/VTE.  [name] Sodium 11/28/2013 136  136 - 145 mEq/L Final  . Potassium 11/28/2013 3.7  3.5 - 5.1 mEq/L Final  . Chloride 11/28/2013 98  98 - 109 mEq/L Final  . CO2 11/28/2013 30* 22 - 29 mEq/L Final  . Glucose 11/28/2013 85  70 - 140 mg/dl Final  . BUN 11/28/2013 21.9  7.0 - 26.0 mg/dL Final  . Creatinine 11/28/2013 1.0  0.6 - 1.1 mg/dL Final  . Total Bilirubin 11/28/2013 0.87  0.20 - 1.20 mg/dL Final  . Alkaline Phosphatase 11/28/2013 51  40 - 150 U/L Final  . AST 11/28/2013 20  5 - 34 U/L Final  . ALT 11/28/2013 30  0 - 55 U/L Final  . Total Protein 11/28/2013 6.6  6.4 - 8.3 g/dL Final  . Albumin 11/28/2013 3.9  3.5 - 5.0 g/dL Final  . Calcium 11/28/2013 9.7  8.4 - 10.4 mg/dL Final  . Anion Gap 11/28/2013 8  3 - 11 mEq/L Final  . WBC 11/28/2013 10.2  3.9 - 10.3 10e3/uL Final  . NEUT# 11/28/2013 6.2  1.5 - 6.5 10e3/uL Final  . HGB 11/28/2013 13.1  11.6 - 15.9 g/dL Final  . HCT 11/28/2013 38.8  34.8 - 46.6 % Final  . Platelets 11/28/2013 208  145 - 400 10e3/uL Final  . MCV 11/28/2013 97.6  79.5 - 101.0 fL Final  . MCH 11/28/2013 33.0  25.1 - 34.0 pg Final  . MCHC 11/28/2013 33.8  31.5 - 36.0 g/dL Final  . RBC 11/28/2013 3.97  3.70 - 5.45  10e6/uL Final  . RDW 11/28/2013 13.4  11.2 - 14.5 % Final  . lymph# 11/28/2013 2.4  0.9 - 3.3 10e3/uL Final  . MONO#  11/28/2013 1.2* 0.1 - 0.9 10e3/uL Final  . Eosinophils Absolute 11/28/2013 0.2  0.0 - 0.5 10e3/uL Final  . Basophils Absolute 11/28/2013 0.1  0.0 - 0.1 10e3/uL Final  . NEUT% 11/28/2013 61.5  38.4 - 76.8 % Final  . LYMPH% 11/28/2013 23.5  14.0 - 49.7 % Final  . MONO% 11/28/2013 11.9  0.0 - 14.0 % Final  . EOS% 11/28/2013 2.2  0.0 - 7.0 % Final  . BASO% 11/28/2013 0.9  0.0 - 2.0 % Final     X-Rays:Dg Chest 2 View  12/27/2013   CLINICAL DATA:  Preop for right knee replacement  EXAM: CHEST  2 VIEW  COMPARISON:  07/14/2013  FINDINGS: Cardiomediastinal silhouette is unremarkable. No acute infiltrate or pleural effusion. No pulmonary edema. Mild degenerative changes mid thoracic spine.  IMPRESSION: No active cardiopulmonary disease.   Electronically Signed   By: Lahoma Crocker M.D.   On: 12/27/2013 12:16    EKG: Orders placed during the hospital encounter of 12/27/13  . EKG 12-LEAD  . EKG 12-LEAD     Hospital Course: Elizabeth Wang is a 68 y.o. who was admitted to [facility]. They were brought to the operating room on 01/01/2014 and underwent Procedure(s): TOTAL RIGHT KNEE ARTHROPLASTY.  Patient tolerated the procedure well and was later transferred to the recovery room and then to the orthopaedic floor for postoperative care.  They were given PO and IV analgesics for pain control following their surgery.  They were given 24 hours of postoperative antibiotics of  Anti-infectives   Start     Dose/Rate Route Frequency Ordered Stop   01/01/14 1430  ceFAZolin (ANCEF) IVPB 2 g/50 mL premix     2 g 100 mL/hr over 30 Minutes Intravenous Every 6 hours 01/01/14 1142 01/01/14 2117   01/01/14 0633  ceFAZolin (ANCEF) IVPB 2 g/50 mL premix     2 g 100 mL/hr over 30 Minutes Intravenous On call to O.R. 01/01/14 8325 01/01/14 0826     and started on DVT prophylaxis in the form of Xarelto.   PT and OT were ordered for total joint protocol.  Discharge planning consulted to help with postop disposition and  equipment needs.  Social worker was contacted to assist with placement  Patient had a good night on the evening of surgery.  They started to get up OOB with therapy on day one. Hemovac drain was pulled without difficulty.  Continued to work with therapy into day two.  Dressing was changed on day two and the incision was healing well.  Patient was seen in rounds by Dr. Wynelle Link and the insurance approved for transfer and she was ready to go to the SNF.  Discharge Medications: Prior to Admission medications   Medication Sig Start Date End Date Taking? Authorizing Provider  acetaminophen (TYLENOL) 500 MG tablet Take 1,000 mg by mouth 2 (two) times daily as needed for pain.    Yes Historical Provider, MD  cloNIDine (CATAPRES) 0.3 MG tablet Take 0.15 mg by mouth 2 (two) times daily.    Yes Historical Provider, MD  colchicine 0.6 MG tablet Take 1 tablet (0.6 mg total) by mouth 2 (two) times daily. 07/17/13  Yes Cherene Altes, MD  diltiazem (CARDIZEM CD) 240 MG 24 hr capsule Take 240 mg by mouth every morning.   Yes Historical Provider, MD  enalapril (VASOTEC) 20 MG tablet Take 20 mg by mouth 2 (two) times daily.    Yes Historical Provider, MD  hydrochlorothiazide (MICROZIDE) 12.5 MG capsule Take 12.5 mg by mouth daily.   Yes Historical Provider, MD  levothyroxine (SYNTHROID, LEVOTHROID) 125 MCG tablet Take 125 mcg by mouth daily before breakfast.   Yes Historical Provider, MD  Potassium Gluconate 550 MG TABS Take 550 mg by mouth daily.    Yes Historical Provider, MD  bisacodyl (DULCOLAX) 10 MG suppository Place 1 suppository (10 mg total) rectally daily as needed for moderate constipation. 01/03/14   Alexzandrew Perkins, PA-C  cyclobenzaprine (FLEXERIL) 10 MG tablet Take 1 tablet (10 mg total) by mouth 3 (three) times daily as needed for muscle spasms. 01/03/14   Alexzandrew Perkins, PA-C  docusate sodium 100 MG CAPS Take 100 mg by mouth 2 (two) times daily. 01/03/14   Alexzandrew Dara Lords, PA-C  EPINEPHrine  (EPIPEN) 0.3 mg/0.3 mL SOAJ injection Inject 0.3 mg into the muscle as needed (Allergic Reaction).     Historical Provider, MD  HYDROcodone-acetaminophen (NORCO/VICODIN) 5-325 MG per tablet Take 1-2 tablets by mouth every 4 (four) hours as needed for moderate pain. 01/03/14   Alexzandrew Dara Lords, PA-C  metoCLOPramide (REGLAN) 5 MG tablet Take 1-2 tablets (5-10 mg total) by mouth every 8 (eight) hours as needed for nausea (if ondansetron (ZOFRAN) ineffective.). 01/03/14   Alexzandrew Perkins, PA-C  ondansetron (ZOFRAN) 4 MG tablet Take 1 tablet (4 mg total) by mouth every 6 (six) hours as needed for nausea. 01/03/14   Alexzandrew Perkins, PA-C  polyethylene glycol (MIRALAX / GLYCOLAX) packet Take 17 g by mouth daily as needed for mild constipation. 01/03/14   Alexzandrew Dara Lords, PA-C  rivaroxaban (XARELTO) 10 MG TABS tablet Take 1 tablet (10 mg total) by mouth daily with breakfast. Take one more dose on 01/03/2014 and then will resume her home dose of 20 mg Xarelto. 01/03/14   Alexzandrew Dara Lords, PA-C  Rivaroxaban (XARELTO) 20 MG TABS tablet Take 1 tablet (20 mg total) by mouth daily. Take Xarelto 20 mg for three more weeks starting on Thursday 01/04/2014, then discontinue Xarelto. Once the patient has completed the blood thinner regimen, then start taking Baby 81 mg Aspirin 2 tablets daily. 01/03/14   Alexzandrew Perkins, PA-C  traMADol (ULTRAM) 50 MG tablet Take 1-2 tablets (50-100 mg total) by mouth every 6 (six) hours as needed (mild ot moderate pain). 01/03/14   Alexzandrew Perkins, PA-C  zolpidem (AMBIEN) 10 MG tablet Take 10 mg by mouth at bedtime as needed for sleep.    Historical Provider, MD   Discharge to SNF  Diet - Cardiac diet  Follow up - in 2 weeks  Activity - WBAT  Disposition - Skilled nursing facility  Condition Upon Discharge - Good  D/C Meds - See DC Summary  DVT Prophylaxis - Xarelto 10 mg for one more day and then switch back to her preop dose of 20 mg daily for three weeks and then  Aspirin 81 mg 2 tablets daily.       Discharge Orders   Future Orders Complete By Expires   Call MD / Call 911  As directed    Comments:     If you experience chest pain or shortness of breath, CALL 911 and be transported to the hospital emergency room.  If you develope a fever above 101 F, pus (white drainage) or increased drainage or redness at the wound, or calf pain, call your surgeon's office.   Change dressing  As directed    Comments:     Change dressing daily with sterile 4 x 4 inch gauze dressing and apply TED hose. Do not submerge the incision under water.   Constipation Prevention  As directed    Comments:     Drink plenty of fluids.  Prune juice may be helpful.  You may use a stool softener, such as Colace (over the counter) 100 mg twice a day.  Use MiraLax (over the counter) for constipation as needed.   Diet - low sodium heart healthy  As directed    Discharge instructions  As directed    Comments:     Pick up stool softner and laxative for home. Do not submerge incision under water. May shower. Continue to use ice for pain and swelling from surgery.  Take one more dose of Xarelto 10 mg on Wednesday 01/03/2014. Take Xarelto 20 mg for three more weeks starting on Thursday 01/04/2014, then discontinue Xarelto. Once the patient has completed the blood thinner regimen, then start taking Baby 81 mg Aspirin 2 tablets daily.  When discharged from the skilled rehab facility, please have the facility set up the patient's [place] prior to being released.  Also provide the patient with their medications at time of release from the facility to include their pain medication, the muscle relaxants, and their blood thinner medication.  If the patient is still at the rehab facility at time of follow up appointment, please also assist the patient in arranging follow up appointment in our office and any transportation needs.   Do not put a pillow under the knee. Place it  under the heel.  As directed    Do not sit on low chairs, stoools or toilet seats, as it may be difficult to get up from low surfaces  As directed    Driving restrictions  As directed    Comments:     No driving until released by the physician.   Increase activity slowly as tolerated  As directed    Lifting restrictions  As directed    Comments:     No lifting until released by the physician.   Patient may shower  As directed    Comments:     You may shower without a dressing once there is no drainage.  Do not wash over the wound.  If drainage remains, do not shower until drainage stops.   TED hose  As directed    Comments:     Use stockings (TED hose) for 3 weeks on both leg(s).  You may remove them at night for sleeping.   Weight bearing as tolerated  As directed    Questions:     Laterality:     Extremity:         Medication List    STOP taking these medications       aspirin 81 MG tablet     [employer] LEG CRAMPS PAIN RELIEF PO     JUICE PLUS FIBRE PO     OVER THE COUNTER MEDICATION     OVER THE COUNTER MEDICATION      TAKE these medications       acetaminophen 500 MG tablet  Commonly known as:  TYLENOL  Take 1,000 mg by mouth 2 (two) times daily as needed for pain.     bisacodyl 10 MG suppository  Commonly known as:  DULCOLAX  Place 1 suppository (10 mg total) rectally daily as needed for moderate constipation.  cloNIDine 0.3 MG tablet  Commonly known as:  CATAPRES  Take 0.15 mg by mouth 2 (two) times daily.     colchicine 0.6 MG tablet  Take 1 tablet (0.6 mg total) by mouth 2 (two) times daily.     cyclobenzaprine 10 MG tablet  Commonly known as:  FLEXERIL  Take 1 tablet (10 mg total) by mouth 3 (three) times daily as needed for muscle spasms.     diltiazem 240 MG 24 hr capsule  Commonly known as:  CARDIZEM CD  Take 240 mg by mouth every morning.     DSS 100 MG Caps  Take 100 mg by mouth 2 (two) times daily.     enalapril 20 MG tablet  Commonly  known as:  VASOTEC  Take 20 mg by mouth 2 (two) times daily.     EPIPEN 0.3 mg/0.3 mL Soaj injection  Generic drug:  EPINEPHrine  Inject 0.3 mg into the muscle as needed (Allergic Reaction).     hydrochlorothiazide 12.5 MG capsule  Commonly known as:  MICROZIDE  Take 12.5 mg by mouth daily.     HYDROcodone-acetaminophen 5-325 MG per tablet  Commonly known as:  NORCO/VICODIN  Take 1-2 tablets by mouth every 4 (four) hours as needed for moderate pain.     levothyroxine 125 MCG tablet  Commonly known as:  SYNTHROID, LEVOTHROID  Take 125 mcg by mouth daily before breakfast.     metoCLOPramide 5 MG tablet  Commonly known as:  REGLAN  Take 1-2 tablets (5-10 mg total) by mouth every 8 (eight) hours as needed for nausea (if ondansetron (ZOFRAN) ineffective.).     ondansetron 4 MG tablet  Commonly known as:  ZOFRAN  Take 1 tablet (4 mg total) by mouth every 6 (six) hours as needed for nausea.     polyethylene glycol packet  Commonly known as:  MIRALAX / GLYCOLAX  Take 17 g by mouth daily as needed for mild constipation.     Potassium Gluconate 550 MG Tabs  Take 550 mg by mouth daily.     rivaroxaban 10 MG Tabs tablet  Commonly known as:  XARELTO  Take 1 tablet (10 mg total) by mouth daily with breakfast. Take one more dose on 01/03/2014 and then will resume her home dose of 20 mg Xarelto.     Rivaroxaban 20 MG Tabs tablet  Commonly known as:  XARELTO  - Take 1 tablet (20 mg total) by mouth daily. Take Xarelto 20 mg for three more weeks starting on Thursday 01/04/2014, then discontinue Xarelto.  - Once the patient has completed the blood thinner regimen, then start taking Baby 81 mg Aspirin 2 tablets daily.     traMADol 50 MG tablet  Commonly known as:  ULTRAM  Take 1-2 tablets (50-100 mg total) by mouth every 6 (six) hours as needed (mild ot moderate pain).     zolpidem 10 MG tablet  Commonly known as:  AMBIEN  Take 10 mg by mouth at bedtime as needed for sleep.        Follow-up Information   Follow up with [name], MD. Schedule an appointment as soon as possible for a visit on 01/17/2014. (Call office for appointment time.)    Specialty:  Orthopedic Surgery   Contact information:   [address] [place] [address] [phone]       Signed: Mickel Crow 01/03/2014, 10:12 AM

## 2014-01-03 NOTE — Progress Notes (Signed)
Clinical Social Work Department CLINICAL SOCIAL WORK PLACEMENT NOTE 01/03/2014  Patient:  Elizabeth Wang, Elizabeth Wang  Account Number:  [id number] Admit date:  01/01/2014  Clinical Social Worker:  Werner Lean, LCSW  Date/time:  01/02/2014 11:13 AM  Clinical Social Work is seeking post-discharge placement for this patient at the following level of care:   SKILLED NURSING   (*CSW will update this form in Epic as items are completed)     Patient/family provided with [place] Department of Clinical Social Work's list of facilities offering this level of care within the geographic area requested by the patient (or if unable, by the patient's family).  01/02/2014  Patient/family informed of their freedom to choose among providers that offer the needed level of care, that participate in Medicare, Medicaid or managed care program needed by the patient, have an available bed and are willing to accept the patient.    Patient/family informed of [facility]' ownership interest in [facility], as well as of the fact that they are under no obligation to receive care at this facility.  PASARR submitted to EDS on 01/02/2014 PASARR number received from EDS on 01/02/2014  FL2 transmitted to all facilities in geographic area requested by pt/family on  01/02/2014 FL2 transmitted to all facilities within larger geographic area on   Patient informed that his/her managed care company has contracts with or will negotiate with  certain facilities, including the following:     Patient/family informed of bed offers received:  01/02/2014 Patient chooses bed at [facility] Physician recommends and patient chooses bed at    Patient to be transferred to [facility] on  01/03/2014 Patient to be transferred to facility by [facility]  The following physician request were entered in Epic:   Additional Comments:  [employer] medicare provided auth for SNF .  Werner Lean LCSW [phone]

## 2014-01-04 ENCOUNTER — Ambulatory Visit: Payer: Medicare Other | Admitting: Hematology and Oncology

## 2014-01-05 ENCOUNTER — Encounter: Payer: Self-pay | Admitting: *Deleted

## 2014-01-11 ENCOUNTER — Encounter: Payer: Self-pay | Admitting: Internal Medicine

## 2014-01-11 ENCOUNTER — Non-Acute Institutional Stay (SKILLED_NURSING_FACILITY): Payer: Medicare Other | Admitting: Internal Medicine

## 2014-01-11 DIAGNOSIS — D62 Acute posthemorrhagic anemia: Secondary | ICD-10-CM

## 2014-01-11 DIAGNOSIS — I4891 Unspecified atrial fibrillation: Secondary | ICD-10-CM

## 2014-01-11 DIAGNOSIS — E039 Hypothyroidism, unspecified: Secondary | ICD-10-CM

## 2014-01-11 DIAGNOSIS — E785 Hyperlipidemia, unspecified: Secondary | ICD-10-CM

## 2014-01-11 DIAGNOSIS — I10 Essential (primary) hypertension: Secondary | ICD-10-CM

## 2014-01-11 DIAGNOSIS — I2699 Other pulmonary embolism without acute cor pulmonale: Secondary | ICD-10-CM

## 2014-01-11 DIAGNOSIS — Z96659 Presence of unspecified artificial knee joint: Secondary | ICD-10-CM

## 2014-01-11 NOTE — Assessment & Plan Note (Signed)
Pt is not on a statin

## 2014-01-11 NOTE — Progress Notes (Signed)
MRN: [id number] Name: Elizabeth Wang  Sex: female Age: 68 y.o. DOB: [birth year]  [place] #: [facility] Facility/Room: 307 Level Of Care: SNF Provider: Inocencio Homes D Emergency Contacts: Extended Emergency Contact Information Primary Emergency Contact: Kong,Jonathan W Address: [address]          [place], [place] [address] [place] of [place] Phone: [phone] Mobile Phone: [phone] Relation: Spouse Secondary Emergency Contact: Remlinger,Justin Address: [place], [place] [address] [place] Phone: [phone] Mobile Phone: [phone] Relation: Son  Code Status: FULL  Allergies: Nsaids and Codeine  Chief Complaint  Patient presents with  . nursing home admission    HPI: Patient is 68 y.o. female who is admitted for OT/PT after R knee arthroplasty.  Past Medical History  Diagnosis Date  . Hypertension   . Clotting disorder     DVT/PE  . Obesity   . Hypothyroidism   . Esophageal reflux   . Dyslipidemia   . Diverticulosis   . Acute pulmonary embolism 06/2013    now on xarelto follows w hematology  . Dysrhythmia     AFIB 2014  . Numbness     TOES  . Arthritis   . Pseudogout   . Sleep apnea     "not bad enough to use c- pap"  . Asthma     Past Surgical History  Procedure Laterality Date  . Breast reconstruction    . Cesarean section    . Knee arthroscopy Left   . Colonoscopy  2004    repeat in ten years  . Tonsillectomy    . Wisdom tooth extraction    . Tummy tuck  1987  . Total knee arthroplasty Right 01/01/2014    Procedure: TOTAL RIGHT KNEE ARTHROPLASTY;  Surgeon: [name], MD;  Location: [facility];  Service: Orthopedics;  Laterality: Right;      Medication List       This list is accurate as of: 01/11/14  9:18 PM.  Always use your most recent med list.               acetaminophen 500 MG tablet  Commonly known as:  TYLENOL  Take 1,000 mg by mouth 2 (two) times daily as needed for pain.      bisacodyl 10 MG suppository  Commonly known as:  DULCOLAX  Place 1 suppository (10 mg total) rectally daily as needed for moderate constipation.     cloNIDine 0.3 MG tablet  Commonly known as:  CATAPRES  Take 0.15 mg by mouth 2 (two) times daily. For HTN     colchicine 0.6 MG tablet  Take 0.6 mg by mouth 2 (two) times daily. For gout     cyclobenzaprine 10 MG tablet  Commonly known as:  FLEXERIL  Take 1 tablet (10 mg total) by mouth 3 (three) times daily as needed for muscle spasms.     diltiazem 240 MG 24 hr capsule  Commonly known as:  CARDIZEM CD  Take 240 mg by mouth every morning. For HTN     DSS 100 MG Caps  Take 100 mg by mouth 2 (two) times daily. For constipation     enalapril 20 MG tablet  Commonly known as:  VASOTEC  Take 20 mg by mouth 2 (two) times daily. For treatment of CHF, HTN.     EPIPEN 0.3 mg/0.3 mL Soaj injection  Generic drug:  EPINEPHrine  Inject 0.3 mg into the muscle  as needed (Allergic Reaction).     hydrochlorothiazide 12.5 MG capsule  Commonly known as:  MICROZIDE  Take 12.5 mg by mouth daily. For HTN     HYDROcodone-acetaminophen 5-325 MG per tablet  Commonly known as:  NORCO/VICODIN  Take 1-2 tablets by mouth every 4 (four) hours as needed for moderate pain.     levothyroxine 125 MCG tablet  Commonly known as:  SYNTHROID, LEVOTHROID  Take 125 mcg by mouth daily before breakfast. For thyroid therapy     metoCLOPramide 5 MG tablet  Commonly known as:  REGLAN  Take 1-2 tablets (5-10 mg total) by mouth every 8 (eight) hours as needed for nausea (if ondansetron (ZOFRAN) ineffective.).     ondansetron 4 MG tablet  Commonly known as:  ZOFRAN  Take 1 tablet (4 mg total) by mouth every 6 (six) hours as needed for nausea.     polyethylene glycol packet  Commonly known as:  MIRALAX / GLYCOLAX  Take 17 g by mouth daily as needed for mild constipation.     Potassium Gluconate 550 MG Tabs  Take 550 mg by mouth daily.     Rivaroxaban 20 MG Tabs  tablet  Commonly known as:  XARELTO  - Take 1 tablet (20 mg total) by mouth daily. Take Xarelto 20 mg for three more weeks starting on Thursday 01/04/2014, then discontinue Xarelto.  - Once the patient has completed the blood thinner regimen, then start taking Baby 81 mg Aspirin 2 tablets daily.     traMADol 50 MG tablet  Commonly known as:  ULTRAM  Take 1-2 tablets (50-100 mg total) by mouth every 6 (six) hours as needed (mild ot moderate pain).     zolpidem 10 MG tablet  Commonly known as:  AMBIEN  Take 10 mg by mouth at bedtime as needed for sleep.        No orders of the defined types were placed in this encounter.    Immunization History  Administered Date(s) Administered  . Influenza,inj,Quad PF,36+ Mos 07/15/2013    History  Substance Use Topics  . Smoking status: Former Smoker -- 0.25 packs/day for 12 years    Quit date: 12/28/1975  . Smokeless tobacco: Never Used  . Alcohol Use: 2.4 oz/week    4 Glasses of wine per week     Comment: socially    Family history is noncontributory    Review of Systems  DATA OBTAINED: from patient; pt has no c/o GENERAL: Feels well no fevers, fatigue, appetite changes SKIN: No itching, rash or wounds EYES: No eye pain, redness, discharge EARS: No earache, tinnitus, change in hearing NOSE: No congestion, drainage or bleeding  MOUTH/THROAT: No mouth or tooth pain, No sore throat, No difficulty chewing or swallowing  RESPIRATORY: No cough, wheezing, SOB CARDIAC: No chest pain, palpitations, lower extremity edema  GI: No abdominal pain, No N/V/D or constipation, No heartburn or reflux  GU: No dysuria, frequency or urgency, or incontinence  MUSCULOSKELETAL: No unrelieved bone/joint pain NEUROLOGIC: No headache, dizziness or focal weakness PSYCHIATRIC: No overt anxiety or sadness. Sleeps well. No behavior issue.   Filed Vitals:   01/11/14 2101  BP: 126/68  Pulse: 76  Temp: 98.2 F (36.8 C)  Resp: 18    Physical  Exam  GENERAL APPEARANCE: Alert, conversant. Appropriately groomed. No acute distress.  SKIN: No diaphoresis rash; incision site dressed, no heat HEAD: Normocephalic, atraumatic  EYES: Conjunctiva/lids clear. Pupils round, reactive. EOMs intact.  EARS: External exam WNL, canals clear. Hearing  grossly normal.  NOSE: No deformity or discharge.  MOUTH/THROAT: Lips w/o lesions.   RESPIRATORY: Breathing is even, unlabored. Lung sounds are clear   CARDIOVASCULAR: Heart RRR no murmurs, rubs or gallops. No peripheral edema.  GASTROINTESTINAL: Abdomen is soft, non-tender, not distended w/ normal bowel sounds GENITOURINARY: Bladder non tender, not distended  MUSCULOSKELETAL: R knee arthroplasty NEUROLOGIC: Oriented X3. Cranial nerves 2-12 grossly intact. Moves all extremities no tremor. PSYCHIATRIC: Mood and affect appropriate to situation, no behavioral issues  Patient Active Problem List   Diagnosis Date Noted  . S/P total knee arthroplasty 01/11/2014  . Hypokalemia 01/03/2014  . Hyponatremia 01/03/2014  . Postoperative anemia due to acute blood loss 01/02/2014  . Unspecified hypothyroidism 01/02/2014  . Esophageal reflux 01/02/2014  . Other and unspecified hyperlipidemia 01/02/2014  . OA (osteoarthritis) of knee 01/01/2014  . Atrial fibrillation 11/28/2013  . Essential hypertension, benign 11/28/2013  . Pulmonary embolism 11/28/2013  . DVT (deep venous thrombosis) 08/09/2013  . Other pulmonary embolism and infarction 08/09/2013  . Knee pain, bilateral 07/14/2013    CBC    Component Value Date/Time   WBC 12.4* 01/03/2014 0420   WBC 10.2 11/28/2013 1013   RBC 2.94* 01/03/2014 0420   RBC 3.97 11/28/2013 1013   HGB 9.4* 01/03/2014 0420   HGB 13.1 11/28/2013 1013   HCT 27.4* 01/03/2014 0420   HCT 38.8 11/28/2013 1013   PLT 152 01/03/2014 0420   PLT 208 11/28/2013 1013   MCV 93.2 01/03/2014 0420   MCV 97.6 11/28/2013 1013   LYMPHSABS 2.4 11/28/2013 1013   MONOABS 1.2* 11/28/2013 1013   EOSABS 0.2  11/28/2013 1013   BASOSABS 0.1 11/28/2013 1013    CMP     Component Value Date/Time   NA 134* 01/03/2014 0420   NA 136 11/28/2013 1013   K 3.4* 01/03/2014 0420   K 3.7 11/28/2013 1013   CL 93* 01/03/2014 0420   CO2 28 01/03/2014 0420   CO2 30* 11/28/2013 1013   GLUCOSE 132* 01/03/2014 0420   GLUCOSE 85 11/28/2013 1013   BUN 15 01/03/2014 0420   BUN 21.9 11/28/2013 1013   CREATININE 0.74 01/03/2014 0420   CREATININE 1.0 11/28/2013 1013   CALCIUM 8.8 01/03/2014 0420   CALCIUM 9.7 11/28/2013 1013   PROT 6.7 12/27/2013 1100   PROT 6.6 11/28/2013 1013   ALBUMIN 3.5 12/27/2013 1100   ALBUMIN 3.9 11/28/2013 1013   AST 27 12/27/2013 1100   AST 20 11/28/2013 1013   ALT 38* 12/27/2013 1100   ALT 30 11/28/2013 1013   ALKPHOS 57 12/27/2013 1100   ALKPHOS 51 11/28/2013 1013   BILITOT 0.5 12/27/2013 1100   BILITOT 0.87 11/28/2013 1013   GFRNONAA 86* 01/03/2014 0420   GFRAA >90 01/03/2014 0420    Assessment and Plan  S/P total knee arthroplasty For end stage OA; pain medication, flexeril and xarelto for prophylaxis; admitted for OT/PT; pt did a pre-op workout program so is progressing very quickly thru rehab  Postoperative anemia due to acute blood loss Pre-op 12/9. Post-op Hb 9.4; will monitor  Atrial fibrillation cardizem for control, xarelto for prophylaxis  Pulmonary embolism H/o;on xarelto  Essential hypertension, benign On microzide, vasotec, clonidine, and diltiazem  Unspecified hypothyroidism Continue levothyroxine 125 mcg  Other and unspecified hyperlipidemia Pt is not on a statin    Hennie Duos, MD

## 2014-01-11 NOTE — Assessment & Plan Note (Signed)
On microzide, vasotec, clonidine, and diltiazem

## 2014-01-11 NOTE — Assessment & Plan Note (Signed)
Continue levothyroxine 125 mcg.

## 2014-01-11 NOTE — Assessment & Plan Note (Signed)
Pre-op 12/9. Post-op Hb 9.4; will monitor

## 2014-01-11 NOTE — Assessment & Plan Note (Addendum)
For end stage OA; pain medication, flexeril and xarelto for prophylaxis; admitted for OT/PT; pt did a pre-op workout program so is progressing very quickly thru rehab

## 2014-01-11 NOTE — Assessment & Plan Note (Signed)
cardizem for control, xarelto for prophylaxis

## 2014-01-11 NOTE — Assessment & Plan Note (Signed)
H/o;on xarelto

## 2014-01-12 ENCOUNTER — Non-Acute Institutional Stay (SKILLED_NURSING_FACILITY): Payer: Medicare Other | Admitting: Adult Health

## 2014-01-12 ENCOUNTER — Encounter: Payer: Self-pay | Admitting: Adult Health

## 2014-01-12 DIAGNOSIS — M179 Osteoarthritis of knee, unspecified: Secondary | ICD-10-CM

## 2014-01-12 DIAGNOSIS — K5900 Constipation, unspecified: Secondary | ICD-10-CM | POA: Insufficient documentation

## 2014-01-12 DIAGNOSIS — M1710 Unilateral primary osteoarthritis, unspecified knee: Secondary | ICD-10-CM

## 2014-01-12 DIAGNOSIS — M109 Gout, unspecified: Secondary | ICD-10-CM | POA: Insufficient documentation

## 2014-01-12 DIAGNOSIS — IMO0002 Reserved for concepts with insufficient information to code with codable children: Secondary | ICD-10-CM

## 2014-01-12 DIAGNOSIS — I10 Essential (primary) hypertension: Secondary | ICD-10-CM

## 2014-01-12 DIAGNOSIS — E039 Hypothyroidism, unspecified: Secondary | ICD-10-CM

## 2014-01-12 DIAGNOSIS — Z96659 Presence of unspecified artificial knee joint: Secondary | ICD-10-CM

## 2014-01-12 NOTE — Progress Notes (Signed)
Patient ID: Elizabeth Wang, female   DOB: Mar 07, 1946, 68 y.o.   MRN: [id number]              PROGRESS NOTE  DATE: 01/12/2014   FACILITY: [facility]  LEVEL OF CARE: SNF (31)  Acute Visit  CHIEF COMPLAINT:  Discharge Notes  HISTORY OF PRESENT ILLNESS: This is a 68 year old female who is for discharge home and will have outpatient care. She has been admitted to [facility] on 01/03/14 from [facility] with Osteoarthritis S/P right total knee arthroplasty. Patient was admitted to this facility for short-term rehabilitation after the patient's recent hospitalization.  Patient has completed SNF rehabilitation and therapy has cleared the patient for discharge.  Reassessment of ongoing problem(s):  HTN: Pt 's HTN remains stable.  Denies CP, sob, DOE, pedal edema, headaches, dizziness or visual disturbances.  No complications from the medications currently being used.  Last BP : 110/56  GOUT: Patien'st  gout remains stable. Patient denies joint pan, redness, swelling or warmth. No complications reported from the medications presently being used.  HYPOTHYROIDISM: The hypothyroidism remains stable. No complications noted from the medications presently being used.  The patient denies fatigue or constipation.  9/14 TSH 1.482  PAST MEDICAL HISTORY : Reviewed.  No changes.  CURRENT MEDICATIONS: Reviewed per [facility]  REVIEW OF SYSTEMS:  GENERAL: no change in appetite, no fatigue, no weight changes, no fever, chills or weakness RESPIRATORY: no cough, SOB, DOE, wheezing, hemoptysis CARDIAC: no chest pain, edema or palpitations GI: no abdominal pain, diarrhea, constipation, heart burn, nausea or vomiting  PHYSICAL EXAMINATION  GENERAL: no acute distress, normal body habitus EYES: conjunctivae normal, sclerae normal, normal eye lids NECK: supple, trachea midline, no neck masses, no thyroid tenderness, no thyromegaly LYMPHATICS: no LAN in the neck, no supraclavicular  LAN RESPIRATORY: breathing is even & unlabored, BS CTAB CARDIAC: RRR, no murmur,no extra heart sounds, no edema GI: abdomen soft, normal BS, no masses, no tenderness, no hepatomegaly, no splenomegaly EXTREMITIES: able to move all 4 extremities PSYCHIATRIC: the patient is alert & oriented to person, affect & behavior appropriate  LABS/RADIOLOGY: Labs reviewed: Basic Metabolic Panel:  Recent Labs  12/27/13 1100 01/02/14 0415 01/03/14 0420  NA 137 137 134*  K 4.1 3.8 3.4*  CL 99 99 93*  CO2 26 26 28   GLUCOSE 101* 161* 132*  BUN 20 14 15   CREATININE 0.98 0.83 0.74  CALCIUM 9.2 8.4 8.8   Liver Function Tests:  Recent Labs  07/14/13 1425 11/28/13 1013 12/27/13 1100  AST 48* 20 27  ALT 21 30 38*  ALKPHOS 73 51 57  BILITOT 1.1 0.87 0.5  PROT 8.0 6.6 6.7  ALBUMIN 3.2* 3.9 3.5    CBC:  Recent Labs  07/17/13 0540 11/28/13 1013 12/27/13 1100 01/02/14 0415 01/03/14 0420  WBC 5.0 10.2 5.2 13.8* 12.4*  NEUTROABS  --  6.2  --   --   --   HGB 10.6* 13.1 12.9 9.8* 9.4*  HCT 31.0* 38.8 38.5 27.6* 27.4*  MCV 90.9 97.6 96.3 93.6 93.2  PLT 231 208 192 149* 152     ASSESSMENT/PLAN:  Osteoarthritis status post right total knee arthroplasty for outpatient care Hypertension - well controlled; continue Catapres, Vasotec and Microzide Gout - stable; continue colchicine Constipation - no complaints; continue Colace and Miralax Hypothyroidism - stable; continue Synthroid Atrial fibrillation - rate controlled; continue Cardizem    I have filled out patient's discharge paperwork and written prescriptions.  Patient will have  outpatient care.  Total discharge time: Less than 30 minutes Discharge time involved coordination of the discharge process with Education officer, museum, nursing staff and therapy department.    CPT CODE: 02774  Seth Bake - NP [facility] [phone]

## 2014-02-07 ENCOUNTER — Other Ambulatory Visit: Payer: Medicare Other | Admitting: Lab

## 2014-02-07 ENCOUNTER — Encounter (HOSPITAL_COMMUNITY): Payer: Medicare Other

## 2014-02-09 ENCOUNTER — Ambulatory Visit: Payer: Medicare Other | Admitting: Hematology and Oncology

## 2014-06-28 ENCOUNTER — Other Ambulatory Visit: Payer: Self-pay | Admitting: Orthopedic Surgery

## 2014-06-28 NOTE — Progress Notes (Signed)
Preoperative surgical orders have been place into the Epic hospital system for Elizabeth Wang on 06/28/2014, 12:41 PM  by Mickel Crow for surgery on 07/23/2014.  Preop Total Knee orders including Experal, IV Tylenol, and IV Decadron as long as there are no contraindications to the above medications. Arlee Muslim, PA-C

## 2014-07-03 ENCOUNTER — Telehealth: Payer: Self-pay | Admitting: *Deleted

## 2014-07-03 NOTE — Telephone Encounter (Signed)
Pt left VM states Ortho needs clearance from Dr. Alvy Bimler prior to TKA.  She asks if she can drop off forms for Dr. Alvy Bimler to sign?

## 2014-07-04 ENCOUNTER — Telehealth: Payer: Self-pay | Admitting: *Deleted

## 2014-07-04 NOTE — Telephone Encounter (Signed)
Informed pt she can drop off forms for clearance for surgery for Dr. Alvy Bimler to drop off. She verbalized understanding.

## 2014-07-04 NOTE — Telephone Encounter (Signed)
OK 

## 2014-07-09 ENCOUNTER — Telehealth: Payer: Self-pay | Admitting: *Deleted

## 2014-07-09 NOTE — Telephone Encounter (Signed)
Pt dropped off form for Medical Clearance from [facility].  She is scheduled for knee replacement on 07/23/14.  Dr. Alvy Bimler states this form is for Medical Clearance and needs to be addressed by pt's PCP.  Dr. Alvy Bimler can only address what she saw pt for earlier this year, her DVT.  I called office and left VM for Pollyann Savoy informing of above.   Suggested they contact pt's PCP for medical clearance.  If they would like to fax [place] form specific for clearance from Hematology standpoint, then I will give to Dr. Alvy Bimler for review.  I also faxed over Dr. Calton Dach office note from consult which outlines recommendations for DVT prevention perioperatively.

## 2014-07-12 ENCOUNTER — Encounter (HOSPITAL_COMMUNITY): Payer: Self-pay | Admitting: Pharmacy Technician

## 2014-07-18 ENCOUNTER — Inpatient Hospital Stay (HOSPITAL_COMMUNITY): Admission: RE | Admit: 2014-07-18 | Payer: Medicare Other | Source: Ambulatory Visit

## 2014-07-18 ENCOUNTER — Other Ambulatory Visit: Payer: Self-pay | Admitting: Surgical

## 2014-07-18 ENCOUNTER — Encounter (HOSPITAL_COMMUNITY)
Admission: RE | Admit: 2014-07-18 | Discharge: 2014-07-18 | Disposition: A | Discharge status: Home / Self Care | Payer: Medicare Other | Source: Ambulatory Visit | Attending: Orthopedic Surgery | Admitting: Orthopedic Surgery

## 2014-07-18 ENCOUNTER — Encounter (HOSPITAL_COMMUNITY): Payer: Self-pay

## 2014-07-18 DIAGNOSIS — Z01812 Encounter for preprocedural laboratory examination: Secondary | ICD-10-CM | POA: Insufficient documentation

## 2014-07-18 DIAGNOSIS — M1710 Unilateral primary osteoarthritis, unspecified knee: Secondary | ICD-10-CM | POA: Insufficient documentation

## 2014-07-18 LAB — COMPREHENSIVE METABOLIC PANEL
ALK PHOS: 75 U/L (ref 39–117)
ALT: 30 U/L (ref 0–35)
AST: 30 U/L (ref 0–37)
Albumin: 3.7 g/dL (ref 3.5–5.2)
Anion gap: 13 (ref 5–15)
BILIRUBIN TOTAL: 0.5 mg/dL (ref 0.3–1.2)
BUN: 17 mg/dL (ref 6–23)
CHLORIDE: 96 meq/L (ref 96–112)
CO2: 26 meq/L (ref 19–32)
Calcium: 9.5 mg/dL (ref 8.4–10.5)
Creatinine, Ser: 0.81 mg/dL (ref 0.50–1.10)
GFR, EST AFRICAN AMERICAN: 85 mL/min — AB (ref >90)
GFR, EST NON AFRICAN AMERICAN: 73 mL/min — AB (ref >90)
GLUCOSE: 94 mg/dL (ref 70–99)
POTASSIUM: 4.2 meq/L (ref 3.7–5.3)
Sodium: 135 mEq/L — ABNORMAL LOW (ref 137–147)
Total Protein: 6.9 g/dL (ref 6.0–8.3)

## 2014-07-18 LAB — URINALYSIS, ROUTINE W REFLEX MICROSCOPIC
BILIRUBIN URINE: NEGATIVE
GLUCOSE, UA: NEGATIVE mg/dL
KETONES UR: NEGATIVE mg/dL
Leukocytes, UA: NEGATIVE
Nitrite: NEGATIVE
PROTEIN: NEGATIVE mg/dL
Specific Gravity, Urine: 1.01 (ref 1.005–1.030)
UROBILINOGEN UA: 0.2 mg/dL (ref 0.0–1.0)
pH: 6 (ref 5.0–8.0)

## 2014-07-18 LAB — PROTIME-INR
INR: 0.94 (ref 0.00–1.49)
PROTHROMBIN TIME: 12.6 s (ref 11.6–15.2)

## 2014-07-18 LAB — CBC
HEMATOCRIT: 37 % (ref 36.0–46.0)
HEMOGLOBIN: 12.7 g/dL (ref 12.0–15.0)
MCH: 31.8 pg (ref 26.0–34.0)
MCHC: 34.3 g/dL (ref 30.0–36.0)
MCV: 92.7 fL (ref 78.0–100.0)
Platelets: 191 10*3/uL (ref 150–400)
RBC: 3.99 MIL/uL (ref 3.87–5.11)
RDW: 13.5 % (ref 11.5–15.5)
WBC: 5.6 10*3/uL (ref 4.0–10.5)

## 2014-07-18 LAB — URINE MICROSCOPIC-ADD ON

## 2014-07-18 LAB — SURGICAL PCR SCREEN
MRSA, PCR: NEGATIVE
Staphylococcus aureus: POSITIVE — AB

## 2014-07-18 LAB — APTT: APTT: 25 s (ref 24–37)

## 2014-07-18 MED ORDER — TRANEXAMIC ACID 100 MG/ML IV SOLN: 2000.0000 mg | Freq: Once | INTRAVENOUS | Status: DC

## 2014-07-18 NOTE — Progress Notes (Addendum)
ECHO- 06/2013 EPIC  EKG- 12/27/13 EPIC  CXR- 12/27/13 EPIC  Dr Darene Lamer Radford Pax LOV 12/06/13 EPIC  11/30/13- LOV with Dr Alvy Bimler- EPIC - See Dr Alvy Bimler note DVT prophylaxis

## 2014-07-18 NOTE — H&P (Signed)
TOTAL KNEE ADMISSION H&P  Patient is being admitted for left total knee arthroplasty.  Subjective:  Chief Complaint:left knee pain.  HPI: Elizabeth Wang, 68 y.o. female, has a history of pain and functional disability in the left knee due to arthritis and has failed non-surgical conservative treatments for greater than 12 weeks to includeNSAID's and/or analgesics, corticosteriod injections, viscosupplementation injections and activity modification.  Onset of symptoms was gradual, starting >10 years ago with gradually worsening course since that time. The patient noted prior procedures on the knee to include  arthroscopy and menisectomy on the left knee(s).  Patient currently rates pain in the left knee(s) at 8 out of 10 with activity. Patient has night pain, worsening of pain with activity and weight bearing, pain that interferes with activities of daily living, pain with passive range of motion, crepitus and joint swelling.  Patient has evidence of periarticular osteophytes and joint space narrowing by imaging studies. There is no active infection.  Patient Active Problem List   Diagnosis Date Noted  . Gout 01/12/2014  . Constipation 01/12/2014  . S/P total knee arthroplasty 01/11/2014  . Hypokalemia 01/03/2014  . Hyponatremia 01/03/2014  . Postoperative anemia due to acute blood loss 01/02/2014  . Unspecified hypothyroidism 01/02/2014  . Esophageal reflux 01/02/2014  . Other and unspecified hyperlipidemia 01/02/2014  . OA (osteoarthritis) of knee 01/01/2014  . Atrial fibrillation 11/28/2013  . Essential hypertension, benign 11/28/2013  . Pulmonary embolism 11/28/2013  . DVT (deep venous thrombosis) 08/09/2013  . Other pulmonary embolism and infarction 08/09/2013  . Knee pain, bilateral 07/14/2013   Past Medical History  Diagnosis Date  . Hypertension   . Clotting disorder     DVT/PE  . Obesity   . Hypothyroidism   . Esophageal reflux   . Dyslipidemia   . Diverticulosis   . Acute  pulmonary embolism 06/2013    now on xarelto follows w hematology  . Dysrhythmia     AFIB 2014  . Numbness     TOES  . Arthritis   . Pseudogout   . Sleep apnea     "not bad enough to use c- pap"  . Asthma     Past Surgical History  Procedure Laterality Date  . Breast reconstruction    . Cesarean section    . Knee arthroscopy Left   . Colonoscopy  2004    repeat in ten years  . Tonsillectomy    . Wisdom tooth extraction    . Tummy tuck  1987  . Total knee arthroplasty Right 01/01/2014    Procedure: TOTAL RIGHT KNEE ARTHROPLASTY;  Surgeon: [name], MD;  Location: [facility];  Service: Orthopedics;  Laterality: Right;    Current outpatient prescriptions: acetaminophen (TYLENOL) 500 MG tablet, Take 1,000 mg by mouth 2 (two) times daily as needed for pain. , Disp: , Rfl: ;   aspirin EC 81 MG tablet, Take 162 mg by mouth every morning., Disp: , Rfl: ;   bisacodyl (DULCOLAX) 10 MG suppository, Place 1 suppository (10 mg total) rectally daily as needed for moderate constipation., Disp: 12 suppository, Rfl: 0 cloNIDine (CATAPRES) 0.3 MG tablet, Take 0.15 mg by mouth 2 (two) times daily. For HTN, Disp: , Rfl: ;   colchicine 0.6 MG tablet, Take 0.6 mg by mouth 2 (two) times daily. For gout, Disp: , Rfl: ;   diltiazem (CARDIZEM CD) 240 MG 24 hr capsule, Take 240 mg by mouth every morning. For HTN, Disp: , Rfl: ;   enalapril (VASOTEC)  20 MG tablet, Take 20 mg by mouth 2 (two) times daily. For treatment of CHF, HTN., Disp: , Rfl:  EPINEPHrine (EPIPEN) 0.3 mg/0.3 mL SOAJ injection, Inject 0.3 mg into the muscle as needed (Allergic Reaction). , Disp: , Rfl: ;   hydrochlorothiazide (MICROZIDE) 12.5 MG capsule, Take 12.5 mg by mouth every morning. For HTN, Disp: , Rfl: ;   HYDROcodone-acetaminophen (NORCO/VICODIN) 5-325 MG per tablet, Take 1-2 tablets by mouth every 4 (four) hours as needed for moderate pain., Disp: 90 tablet, Rfl: 0 levothyroxine (SYNTHROID, LEVOTHROID) 125 MCG tablet, Take 125  mcg by mouth daily before breakfast. For thyroid therapy, Disp: , Rfl: ;   metoCLOPramide (REGLAN) 5 MG tablet, Take 1-2 tablets (5-10 mg total) by mouth every 8 (eight) hours as needed for nausea (if ondansetron (ZOFRAN) ineffective.)., Disp: 40 tablet, Nutritional Supplements (JUICE PLUS FIBRE PO), Take 3 capsules by mouth 2 (two) times daily., Disp: , Rfl:  ondansetron (ZOFRAN) 4 MG tablet, Take 1 tablet (4 mg total) by mouth every 6 (six) hours as needed for nausea., Disp: 40 tablet, Rfl: 0;   OVER THE COUNTER MEDICATION, Place 1-2 drops into both eyes once as needed (contacts.)., Disp: , Rfl: ;   polyethylene glycol (MIRALAX / GLYCOLAX) packet, Take 17 g by mouth daily as needed for mild constipation., Disp: 14 each, Rfl: 0;   Potassium Gluconate 550 MG TABS, Take 550 mg by mouth daily. , Disp: , Rfl:  zolpidem (AMBIEN) 5 MG tablet, Take 5 mg by mouth at bedtime as needed for sleep., Disp: , Rfl:   Allergies  Allergen Reactions  . Nsaids Other (See Comments)    significantly raises BP  . Codeine Nausea Only    History  Substance Use Topics  . Smoking status: Former Smoker -- 0.25 packs/day for 12 years    Quit date: 12/28/1975  . Smokeless tobacco: Never Used  . Alcohol Use: 2.4 oz/week    4 Glasses of wine per week     Comment: socially    Family History  Problem Relation Age of Onset  . Heart disease Father   . Hypertension Father   . Coronary artery disease Father   . CVA Mother      Review of Systems  Constitutional: Negative.   HENT: Negative.   Eyes: Negative.   Respiratory: Negative.   Cardiovascular: Negative.   Gastrointestinal: Negative.   Genitourinary: Negative.   Musculoskeletal: Positive for joint pain. Negative for back pain, falls, myalgias and neck pain.       Left knee pain  Skin: Negative.   Neurological: Negative.   Endo/Heme/Allergies: Negative.   Psychiatric/Behavioral: Negative.    Vitals  Weight: 202 lb Height: 63in Body Surface Area:  2.02 m Body Mass Index: 35.78 kg/m Pulse: 68 (Regular)  BP: 126/80 (Sitting, Left Arm, Standard)  Objective:  Physical Exam  Constitutional: She is oriented to person, place, and time. She appears well-developed. No distress.  Obese  HENT:  Head: Normocephalic and atraumatic.  Right Ear: External ear normal.  Left Ear: External ear normal.  Nose: Nose normal.  Mouth/Throat: Oropharynx is clear and moist.  Eyes: Conjunctivae and EOM are normal.  Neck: Normal range of motion. Neck supple.  Cardiovascular: Normal rate, normal heart sounds and intact distal pulses.  An irregularly irregular rhythm present.  No murmur heard. Respiratory: Effort normal and breath sounds normal. No respiratory distress. She has no wheezes.  GI: Soft. Bowel sounds are normal. She exhibits no distension. There is no tenderness.  Musculoskeletal:       Right knee: She exhibits swelling. She exhibits normal range of motion, no effusion and no erythema. No tenderness found.       Left knee: She exhibits decreased range of motion and swelling. She exhibits no effusion and no erythema. Tenderness found. Medial joint line and lateral joint line tenderness noted.       Right lower leg: She exhibits no tenderness and no swelling.       Left lower leg: She exhibits no tenderness and no swelling.  Her right knee looks excellent. Swelling is minimal. Range is 0-120 and there is no tenderness or instability. Left knee is in significant valgus. There is marked crepitus on ROM. Range is 5-120. Tender lateral greater than medial with no instability.  Neurological: She is alert and oriented to person, place, and time. She has normal strength and normal reflexes. No sensory deficit.  Skin: No rash noted. She is not diaphoretic. No erythema.  Psychiatric: She has a normal mood and affect. Her behavior is normal.      Imaging Review Plain radiographs demonstrate severe degenerative joint disease of the left  knee(s). The overall alignment issignificant valgus. The bone quality appears to be good for age and reported activity level.  Assessment/Plan:  End stage arthritis, left knee   The patient history, physical examination, clinical judgment of the provider and imaging studies are consistent with end stage degenerative joint disease of the left knee(s) and total knee arthroplasty is deemed medically necessary. The treatment options including medical management, injection therapy arthroscopy and arthroplasty were discussed at length. The risks and benefits of total knee arthroplasty were presented and reviewed. The risks due to aseptic loosening, infection, stiffness, patella tracking problems, thromboembolic complications and other imponderables were discussed. The patient acknowledged the explanation, agreed to proceed with the plan and consent was signed. Patient is being admitted for inpatient treatment for surgery, pain control, PT, OT, prophylactic antibiotics, VTE prophylaxis, progressive ambulation and ADL's and discharge planning. The patient is planning to be discharged to skilled nursing facility [facility])   Topical TXA  Patient would prefer spinal anesthesia  PCP: Dr. Edison Nasuti, PA-C

## 2014-07-18 NOTE — Progress Notes (Signed)
U/A and micro results faxed via EPIC to Dr Wynelle Link.

## 2014-07-18 NOTE — Patient Instructions (Addendum)
Elizabeth Wang  07/18/2014   Your procedure is scheduled on:  07/23/2014    Report to [facility].  Follow the Signs to [place] at   [phone]     am  Call this number if you have problems the morning of surgery: [phone]   Remember:   Do not eat food or drink liquids after midnight.   Take these medicines the morning of surgery with A SIP OF WATER: Clonidine, Cardiazem, Levothyroxine    Do not wear jewelry, make-up or nail polish.  Do not wear lotions, powders, or perfumes.  deodorant.  Do not shave 48 hours prior to surgery.   Do not bring valuables to the hospital.  Contacts, dentures or bridgework may not be worn into surgery.  Leave suitcase in the car. After surgery it may be brought to your room.  For patients admitted to the hospital, checkout time is 11:00 AM the day of  discharge.       [place] - Preparing for Surgery Before surgery, you can play an important role.  Because skin is not sterile, your skin needs to be as free of germs as possible.  You can reduce the number of germs on your skin by washing with CHG (chlorahexidine gluconate) soap before surgery.  CHG is an antiseptic cleaner which kills germs and bonds with the skin to continue killing germs even after washing. Please DO NOT use if you have an allergy to CHG or antibacterial soaps.  If your skin becomes reddened/irritated stop using the CHG and inform your nurse when you arrive at Short Stay. Do not shave (including legs and underarms) for at least 48 hours prior to the first CHG shower.  You may shave your face/neck. Please follow these instructions carefully:  1.  Shower with CHG Soap the night before surgery and the  morning of Surgery.  2.  If you choose to wash your hair, wash your hair first as usual with your  normal  shampoo.  3.  After you shampoo, rinse your hair and body thoroughly to remove the  shampoo.                           4.  Use CHG as you would any other liquid soap.  You  can apply chg directly  to the skin and wash                       Gently with a scrungie or clean washcloth.  5.  Apply the CHG Soap to your body ONLY FROM THE NECK DOWN.   Do not use on face/ open                           Wound or open sores. Avoid contact with eyes, ears mouth and genitals (private parts).                       Wash face,  Genitals (private parts) with your normal soap.             6.  Wash thoroughly, paying special attention to the area where your surgery  will be performed.  7.  Thoroughly rinse your body with warm water from the neck down.  8.  DO NOT shower/wash with your normal soap after using and rinsing off  the CHG Soap.  9.  Pat yourself dry with a clean towel.            10.  Wear clean pajamas.            11.  Place clean sheets on your bed the night of your first shower and do not  sleep with pets. Day of Surgery : Do not apply any lotions/deodorants the morning of surgery.  Please wear clean clothes to the hospital/surgery center.  FAILURE TO FOLLOW THESE INSTRUCTIONS MAY RESULT IN THE CANCELLATION OF YOUR SURGERY PATIENT SIGNATURE_________________________________  NURSE SIGNATURE__________________________________  ________________________________________________________________________  WHAT IS A BLOOD TRANSFUSION? Blood Transfusion Information  A transfusion is the replacement of blood or some of its parts. Blood is made up of multiple cells which provide different functions.  Red blood cells carry oxygen and are used for blood loss replacement.  White blood cells fight against infection.  Platelets control bleeding.  Plasma helps clot blood.  Other blood products are available for specialized needs, such as hemophilia or other clotting disorders. BEFORE THE TRANSFUSION  Who gives blood for transfusions?   Healthy volunteers who are fully evaluated to make sure their blood is safe. This is blood bank blood. Transfusion therapy  is the safest it has ever been in the practice of medicine. Before blood is taken from a donor, a complete history is taken to make sure that person has no history of diseases nor engages in risky social behavior (examples are intravenous drug use or sexual activity with multiple partners). The donor's travel history is screened to minimize risk of transmitting infections, such as malaria. The donated blood is tested for signs of infectious diseases, such as HIV and hepatitis. The blood is then tested to be sure it is compatible with you in order to minimize the chance of a transfusion reaction. If you or a relative donates blood, this is often done in anticipation of surgery and is not appropriate for emergency situations. It takes many days to process the donated blood. RISKS AND COMPLICATIONS Although transfusion therapy is very safe and saves many lives, the main dangers of transfusion include:   Getting an infectious disease.  Developing a transfusion reaction. This is an allergic reaction to something in the blood you were given. Every precaution is taken to prevent this. The decision to have a blood transfusion has been considered carefully by your caregiver before blood is given. Blood is not given unless the benefits outweigh the risks. AFTER THE TRANSFUSION  Right after receiving a blood transfusion, you will usually feel much better and more energetic. This is especially true if your red blood cells have gotten low (anemic). The transfusion raises the level of the red blood cells which carry oxygen, and this usually causes an energy increase.  The nurse administering the transfusion will monitor you carefully for complications. HOME CARE INSTRUCTIONS  No special instructions are needed after a transfusion. You may find your energy is better. Speak with your caregiver about any limitations on activity for underlying diseases you may have. SEEK MEDICAL CARE IF:   Your condition is not  improving after your transfusion.  You develop redness or irritation at the intravenous (IV) site. SEEK IMMEDIATE MEDICAL CARE IF:  Any of the following symptoms occur over the next 12 hours:  Shaking chills.  You have a temperature by mouth above 102 F (38.9 C), not controlled by medicine.  Chest, back, or muscle pain.  People around you feel you are not acting correctly or  are confused.  Shortness of breath or difficulty breathing.  Dizziness and fainting.  You get a rash or develop hives.  You have a decrease in urine output.  Your urine turns a dark color or changes to pink, red, or brown. Any of the following symptoms occur over the next 10 days:  You have a temperature by mouth above 102 F (38.9 C), not controlled by medicine.  Shortness of breath.  Weakness after normal activity.  The white part of the eye turns yellow (jaundice).  You have a decrease in the amount of urine or are urinating less often.  Your urine turns a dark color or changes to pink, red, or brown. Document Released: 10/09/2000 Document Revised: 01/04/2012 Document Reviewed: 05/28/2008 ExitCare Patient Information 2014 [place].  _______________________________________________________________________  Incentive Spirometer  An incentive spirometer is a tool that can help keep your lungs clear and active. This tool measures how well you are filling your lungs with each breath. Taking long deep breaths may help reverse or decrease the chance of developing breathing (pulmonary) problems (especially infection) following:  A long period of time when you are unable to move or be active. BEFORE THE PROCEDURE   If the spirometer includes an indicator to show your best effort, your nurse or respiratory therapist will set it to a desired goal.  If possible, sit up straight or lean slightly forward. Try not to slouch.  Hold the incentive spirometer in an upright position. INSTRUCTIONS FOR  USE  1. Sit on the edge of your bed if possible, or sit up as far as you can in bed or on a chair. 2. Hold the incentive spirometer in an upright position. 3. Breathe out normally. 4. Place the mouthpiece in your mouth and seal your lips tightly around it. 5. Breathe in slowly and as deeply as possible, raising the piston or the ball toward the top of the column. 6. Hold your breath for 3-5 seconds or for as long as possible. Allow the piston or ball to fall to the bottom of the column. 7. Remove the mouthpiece from your mouth and breathe out normally. 8. Rest for a few seconds and repeat Steps 1 through 7 at least 10 times every 1-2 hours when you are awake. Take your time and take a few normal breaths between deep breaths. 9. The spirometer may include an indicator to show your best effort. Use the indicator as a goal to work toward during each repetition. 10. After each set of 10 deep breaths, practice coughing to be sure your lungs are clear. If you have an incision (the cut made at the time of surgery), support your incision when coughing by placing a pillow or rolled up towels firmly against it. Once you are able to get out of bed, walk around indoors and cough well. You may stop using the incentive spirometer when instructed by your caregiver.  RISKS AND COMPLICATIONS  Take your time so you do not get dizzy or light-headed.  If you are in pain, you may need to take or ask for pain medication before doing incentive spirometry. It is harder to take a deep breath if you are having pain. AFTER USE  Rest and breathe slowly and easily.  It can be helpful to keep track of a log of your progress. Your caregiver can provide you with a simple table to help with this. If you are using the spirometer at home, follow these instructions: [place] IF:  You are having difficultly using the spirometer.  You have trouble using the spirometer as often as instructed.  Your pain medication  is not giving enough relief while using the spirometer.  You develop fever of 100.5 F (38.1 C) or higher. SEEK IMMEDIATE MEDICAL CARE IF:   You cough up bloody sputum that had not been present before.  You develop fever of 102 F (38.9 C) or greater.  You develop worsening pain at or near the incision site. MAKE SURE YOU:   Understand these instructions.  Will watch your condition.  Will get help right away if you are not doing well or get worse. Document Released: 02/22/2007 Document Revised: 01/04/2012 Document Reviewed: 04/25/2007 ExitCare Patient Information 2014 ExitCare, [place].   ________________________________________________________________________    Please read over the following fact sheets that you were given: MRSA Information, coughing and deep breathing exercises, leg exercises

## 2014-07-23 ENCOUNTER — Inpatient Hospital Stay (HOSPITAL_COMMUNITY)
Admission: RE | Admit: 2014-07-23 | Discharge: 2014-07-25 | DRG: 470 | Disposition: A | Discharge status: Skilled Nursing Facility | Payer: Medicare Other | Source: Ambulatory Visit | Attending: Orthopedic Surgery | Admitting: Orthopedic Surgery

## 2014-07-23 ENCOUNTER — Encounter (HOSPITAL_COMMUNITY): Payer: Medicare Other | Admitting: Anesthesiology

## 2014-07-23 ENCOUNTER — Inpatient Hospital Stay (HOSPITAL_COMMUNITY): Payer: Medicare Other | Admitting: Anesthesiology

## 2014-07-23 ENCOUNTER — Encounter (HOSPITAL_COMMUNITY): Payer: Self-pay | Admitting: *Deleted

## 2014-07-23 ENCOUNTER — Encounter (HOSPITAL_COMMUNITY)
Admission: RE | Disposition: A | Discharge status: Skilled Nursing Facility | Payer: Self-pay | Source: Ambulatory Visit | Attending: Orthopedic Surgery

## 2014-07-23 DIAGNOSIS — M1712 Unilateral primary osteoarthritis, left knee: Secondary | ICD-10-CM

## 2014-07-23 DIAGNOSIS — Z6835 Body mass index (BMI) 35.0-35.9, adult: Secondary | ICD-10-CM

## 2014-07-23 DIAGNOSIS — M25569 Pain in unspecified knee: Secondary | ICD-10-CM | POA: Diagnosis present

## 2014-07-23 DIAGNOSIS — Z87891 Personal history of nicotine dependence: Secondary | ICD-10-CM

## 2014-07-23 DIAGNOSIS — M1710 Unilateral primary osteoarthritis, unspecified knee: Secondary | ICD-10-CM | POA: Diagnosis present

## 2014-07-23 DIAGNOSIS — E785 Hyperlipidemia, unspecified: Secondary | ICD-10-CM | POA: Diagnosis present

## 2014-07-23 DIAGNOSIS — E039 Hypothyroidism, unspecified: Secondary | ICD-10-CM | POA: Diagnosis present

## 2014-07-23 DIAGNOSIS — Z86711 Personal history of pulmonary embolism: Secondary | ICD-10-CM | POA: Diagnosis not present

## 2014-07-23 DIAGNOSIS — Z01812 Encounter for preprocedural laboratory examination: Secondary | ICD-10-CM

## 2014-07-23 DIAGNOSIS — M179 Osteoarthritis of knee, unspecified: Secondary | ICD-10-CM | POA: Diagnosis present

## 2014-07-23 HISTORY — PX: TOTAL KNEE ARTHROPLASTY: SHX125

## 2014-07-23 LAB — TYPE AND SCREEN
ABO/RH(D): A POS
Antibody Screen: NEGATIVE

## 2014-07-23 SURGERY — ARTHROPLASTY, KNEE, TOTAL
Anesthesia: Spinal | Site: Knee | Laterality: Left

## 2014-07-23 MED ORDER — SODIUM CHLORIDE 0.9 % IJ SOLN
INTRAMUSCULAR | Status: AC
  Filled 2014-07-23: qty 10

## 2014-07-23 MED ORDER — COLCHICINE 0.6 MG PO TABS
0.6000 mg | ORAL_TABLET | Freq: Two times a day (BID) | ORAL | Status: DC
  Administered 2014-07-23 – 2014-07-25 (×4): 0.6 mg via ORAL
  Filled 2014-07-23 (×5): qty 1

## 2014-07-23 MED ORDER — DIPHENHYDRAMINE HCL 12.5 MG/5ML PO ELIX
12.5000 mg | ORAL_SOLUTION | ORAL | Status: DC | PRN
  Administered 2014-07-24 – 2014-07-25 (×2): 12.5 mg via ORAL
  Filled 2014-07-23 (×2): qty 5

## 2014-07-23 MED ORDER — METHOCARBAMOL 1000 MG/10ML IJ SOLN
500.0000 mg | Freq: Four times a day (QID) | INTRAVENOUS | Status: DC | PRN
  Administered 2014-07-23: 500 mg via INTRAVENOUS
  Filled 2014-07-23: qty 5

## 2014-07-23 MED ORDER — DOCUSATE SODIUM 100 MG PO CAPS
100.0000 mg | ORAL_CAPSULE | Freq: Two times a day (BID) | ORAL | Status: DC
  Administered 2014-07-23 – 2014-07-25 (×4): 100 mg via ORAL

## 2014-07-23 MED ORDER — BUPIVACAINE HCL 0.25 % IJ SOLN
INTRAMUSCULAR | Status: DC | PRN
  Administered 2014-07-23: 30 mL

## 2014-07-23 MED ORDER — ENALAPRIL MALEATE 20 MG PO TABS
20.0000 mg | ORAL_TABLET | Freq: Two times a day (BID) | ORAL | Status: DC
  Administered 2014-07-23 – 2014-07-25 (×4): 20 mg via ORAL
  Filled 2014-07-23 (×6): qty 1

## 2014-07-23 MED ORDER — SODIUM CHLORIDE 0.9 % IV SOLN: INTRAVENOUS | Status: DC

## 2014-07-23 MED ORDER — SODIUM CHLORIDE 0.9 % IJ SOLN
INTRAMUSCULAR | Status: AC
  Filled 2014-07-23: qty 50

## 2014-07-23 MED ORDER — DEXAMETHASONE SODIUM PHOSPHATE 10 MG/ML IJ SOLN
10.0000 mg | Freq: Once | INTRAMUSCULAR | Status: AC
  Administered 2014-07-24: 10 mg via INTRAVENOUS
  Filled 2014-07-23: qty 1

## 2014-07-23 MED ORDER — BUPIVACAINE LIPOSOME 1.3 % IJ SUSP
INTRAMUSCULAR | Status: DC | PRN
  Administered 2014-07-23: 20 mL

## 2014-07-23 MED ORDER — ACETAMINOPHEN 10 MG/ML IV SOLN
1000.0000 mg | Freq: Once | INTRAVENOUS | Status: AC
  Administered 2014-07-23: 1000 mg via INTRAVENOUS
  Filled 2014-07-23: qty 100

## 2014-07-23 MED ORDER — PROPOFOL 10 MG/ML IV BOLUS
INTRAVENOUS | Status: AC
  Filled 2014-07-23: qty 20

## 2014-07-23 MED ORDER — FENTANYL CITRATE 0.05 MG/ML IJ SOLN
INTRAMUSCULAR | Status: DC | PRN
  Administered 2014-07-23: 100 ug via INTRAVENOUS

## 2014-07-23 MED ORDER — HYDROMORPHONE HCL 1 MG/ML IJ SOLN: 0.2500 mg | INTRAMUSCULAR | Status: DC | PRN

## 2014-07-23 MED ORDER — DEXAMETHASONE SODIUM PHOSPHATE 10 MG/ML IJ SOLN
10.0000 mg | Freq: Once | INTRAMUSCULAR | Status: AC
  Administered 2014-07-23: 10 mg via INTRAVENOUS

## 2014-07-23 MED ORDER — BUPIVACAINE HCL (PF) 0.25 % IJ SOLN
INTRAMUSCULAR | Status: AC
  Filled 2014-07-23: qty 30

## 2014-07-23 MED ORDER — PROPOFOL INFUSION 10 MG/ML OPTIME
INTRAVENOUS | Status: DC | PRN
  Administered 2014-07-23: 140 ug/kg/min via INTRAVENOUS

## 2014-07-23 MED ORDER — BUPIVACAINE LIPOSOME 1.3 % IJ SUSP
20.0000 mL | Freq: Once | INTRAMUSCULAR | Status: DC
  Filled 2014-07-23: qty 20

## 2014-07-23 MED ORDER — CHLORHEXIDINE GLUCONATE 4 % EX LIQD: 60.0000 mL | Freq: Once | CUTANEOUS | Status: DC

## 2014-07-23 MED ORDER — METOCLOPRAMIDE HCL 10 MG PO TABS: 5.0000 mg | ORAL_TABLET | Freq: Three times a day (TID) | ORAL | Status: DC | PRN

## 2014-07-23 MED ORDER — FLEET ENEMA 7-19 GM/118ML RE ENEM: 1.0000 | ENEMA | Freq: Once | RECTAL | Status: AC | PRN

## 2014-07-23 MED ORDER — ACETAMINOPHEN 325 MG PO TABS: 650.0000 mg | ORAL_TABLET | Freq: Four times a day (QID) | ORAL | Status: DC | PRN

## 2014-07-23 MED ORDER — POTASSIUM GLUCONATE 595 (99 K) MG PO TABS
595.0000 mg | ORAL_TABLET | Freq: Every day | ORAL | Status: DC
  Administered 2014-07-24 – 2014-07-25 (×2): 595 mg via ORAL
  Filled 2014-07-23 (×2): qty 1

## 2014-07-23 MED ORDER — DEXTROSE-NACL 5-0.9 % IV SOLN
INTRAVENOUS | Status: DC
  Administered 2014-07-23 – 2014-07-24 (×3): via INTRAVENOUS

## 2014-07-23 MED ORDER — PROMETHAZINE HCL 25 MG/ML IJ SOLN: 6.2500 mg | INTRAMUSCULAR | Status: DC | PRN

## 2014-07-23 MED ORDER — BISACODYL 5 MG PO TBEC: 5.0000 mg | DELAYED_RELEASE_TABLET | Freq: Every day | ORAL | Status: DC | PRN

## 2014-07-23 MED ORDER — METOCLOPRAMIDE HCL 5 MG/ML IJ SOLN
5.0000 mg | Freq: Three times a day (TID) | INTRAMUSCULAR | Status: DC | PRN
  Administered 2014-07-24: 10 mg via INTRAVENOUS
  Filled 2014-07-23: qty 2

## 2014-07-23 MED ORDER — DILTIAZEM HCL ER COATED BEADS 240 MG PO CP24
240.0000 mg | ORAL_CAPSULE | Freq: Every morning | ORAL | Status: DC
  Administered 2014-07-24 – 2014-07-25 (×2): 240 mg via ORAL
  Filled 2014-07-23 (×2): qty 1

## 2014-07-23 MED ORDER — EPHEDRINE SULFATE 50 MG/ML IJ SOLN
INTRAMUSCULAR | Status: DC | PRN
  Administered 2014-07-23: 5 mg via INTRAVENOUS
  Administered 2014-07-23 (×3): 10 mg via INTRAVENOUS

## 2014-07-23 MED ORDER — MUPIROCIN 2 % EX OINT
TOPICAL_OINTMENT | CUTANEOUS | Status: AC
  Filled 2014-07-23: qty 22

## 2014-07-23 MED ORDER — MORPHINE SULFATE 2 MG/ML IJ SOLN
1.0000 mg | INTRAMUSCULAR | Status: DC | PRN
  Administered 2014-07-23: 1 mg via INTRAVENOUS
  Filled 2014-07-23: qty 1

## 2014-07-23 MED ORDER — POLYETHYLENE GLYCOL 3350 17 G PO PACK: 17.0000 g | PACK | Freq: Every day | ORAL | Status: DC | PRN

## 2014-07-23 MED ORDER — FENTANYL CITRATE 0.05 MG/ML IJ SOLN
INTRAMUSCULAR | Status: AC
Start: 2014-07-23 — End: 2014-07-23
  Filled 2014-07-23: qty 2

## 2014-07-23 MED ORDER — TRAMADOL HCL 50 MG PO TABS
50.0000 mg | ORAL_TABLET | Freq: Four times a day (QID) | ORAL | Status: DC | PRN
  Administered 2014-07-24: 50 mg via ORAL
  Filled 2014-07-23: qty 2

## 2014-07-23 MED ORDER — MIDAZOLAM HCL 2 MG/2ML IJ SOLN
INTRAMUSCULAR | Status: AC
  Filled 2014-07-23: qty 2

## 2014-07-23 MED ORDER — MENTHOL 3 MG MT LOZG: 1.0000 | LOZENGE | OROMUCOSAL | Status: DC | PRN

## 2014-07-23 MED ORDER — SODIUM CHLORIDE 0.9 % IR SOLN
Status: DC | PRN
  Administered 2014-07-23: 1000 mL

## 2014-07-23 MED ORDER — CEFAZOLIN SODIUM-DEXTROSE 2-3 GM-% IV SOLR
INTRAVENOUS | Status: AC
  Filled 2014-07-23: qty 50

## 2014-07-23 MED ORDER — ONDANSETRON HCL 4 MG/2ML IJ SOLN
INTRAMUSCULAR | Status: DC | PRN
  Administered 2014-07-23: 4 mg via INTRAVENOUS

## 2014-07-23 MED ORDER — ONDANSETRON HCL 4 MG/2ML IJ SOLN
INTRAMUSCULAR | Status: AC
  Filled 2014-07-23: qty 2

## 2014-07-23 MED ORDER — ACETAMINOPHEN 650 MG RE SUPP
650.0000 mg | Freq: Four times a day (QID) | RECTAL | Status: DC | PRN
Start: 2014-07-24 — End: 2014-07-25

## 2014-07-23 MED ORDER — CEFAZOLIN SODIUM-DEXTROSE 2-3 GM-% IV SOLR
2.0000 g | INTRAVENOUS | Status: AC
  Administered 2014-07-23: 2 g via INTRAVENOUS

## 2014-07-23 MED ORDER — DEXAMETHASONE SODIUM PHOSPHATE 10 MG/ML IJ SOLN
INTRAMUSCULAR | Status: AC
  Filled 2014-07-23: qty 1

## 2014-07-23 MED ORDER — ONDANSETRON HCL 4 MG PO TABS: 4.0000 mg | ORAL_TABLET | Freq: Four times a day (QID) | ORAL | Status: DC | PRN

## 2014-07-23 MED ORDER — ACETAMINOPHEN 500 MG PO TABS
1000.0000 mg | ORAL_TABLET | Freq: Four times a day (QID) | ORAL | Status: AC
  Administered 2014-07-23 – 2014-07-24 (×4): 1000 mg via ORAL
  Filled 2014-07-23 (×5): qty 2

## 2014-07-23 MED ORDER — SODIUM CHLORIDE 0.9 % IV SOLN
2000.0000 mg | Freq: Once | INTRAVENOUS | Status: DC
  Filled 2014-07-23: qty 20

## 2014-07-23 MED ORDER — HYDROCHLOROTHIAZIDE 12.5 MG PO CAPS
12.5000 mg | ORAL_CAPSULE | Freq: Every morning | ORAL | Status: DC
  Administered 2014-07-24 – 2014-07-25 (×2): 12.5 mg via ORAL
  Filled 2014-07-23 (×3): qty 1

## 2014-07-23 MED ORDER — 0.9 % SODIUM CHLORIDE (POUR BTL) OPTIME
TOPICAL | Status: DC | PRN
  Administered 2014-07-23: 1000 mL

## 2014-07-23 MED ORDER — ONDANSETRON HCL 4 MG/2ML IJ SOLN
4.0000 mg | Freq: Four times a day (QID) | INTRAMUSCULAR | Status: DC | PRN
  Administered 2014-07-23 – 2014-07-24 (×2): 4 mg via INTRAVENOUS
  Filled 2014-07-23 (×2): qty 2

## 2014-07-23 MED ORDER — LEVOTHYROXINE SODIUM 125 MCG PO TABS
125.0000 ug | ORAL_TABLET | Freq: Every day | ORAL | Status: DC
  Administered 2014-07-24 – 2014-07-25 (×2): 125 ug via ORAL
  Filled 2014-07-23 (×3): qty 1

## 2014-07-23 MED ORDER — MIDAZOLAM HCL 5 MG/5ML IJ SOLN
INTRAMUSCULAR | Status: DC | PRN
  Administered 2014-07-23 (×2): 1 mg via INTRAVENOUS

## 2014-07-23 MED ORDER — LACTATED RINGERS IV SOLN
INTRAVENOUS | Status: DC | PRN
  Administered 2014-07-23 (×3): via INTRAVENOUS

## 2014-07-23 MED ORDER — METHOCARBAMOL 500 MG PO TABS
500.0000 mg | ORAL_TABLET | Freq: Four times a day (QID) | ORAL | Status: DC | PRN
  Administered 2014-07-23 – 2014-07-24 (×3): 500 mg via ORAL
  Filled 2014-07-23 (×3): qty 1

## 2014-07-23 MED ORDER — EPHEDRINE SULFATE 50 MG/ML IJ SOLN
INTRAMUSCULAR | Status: AC
  Filled 2014-07-23: qty 1

## 2014-07-23 MED ORDER — SODIUM CHLORIDE 0.9 % IJ SOLN
INTRAMUSCULAR | Status: DC | PRN
Start: 2014-07-23 — End: 2014-07-23
  Administered 2014-07-23: 30 mL via INTRAVENOUS

## 2014-07-23 MED ORDER — CEFAZOLIN SODIUM-DEXTROSE 2-3 GM-% IV SOLR
2.0000 g | Freq: Four times a day (QID) | INTRAVENOUS | Status: AC
  Administered 2014-07-23 (×2): 2 g via INTRAVENOUS
  Filled 2014-07-23 (×2): qty 50

## 2014-07-23 MED ORDER — PHENOL 1.4 % MT LIQD: 1.0000 | OROMUCOSAL | Status: DC | PRN

## 2014-07-23 MED ORDER — RIVAROXABAN 10 MG PO TABS
10.0000 mg | ORAL_TABLET | Freq: Every day | ORAL | Status: DC
  Administered 2014-07-24 – 2014-07-25 (×2): 10 mg via ORAL
  Filled 2014-07-23 (×3): qty 1

## 2014-07-23 MED ORDER — TRANEXAMIC ACID 100 MG/ML IV SOLN
2000.0000 mg | INTRAVENOUS | Status: DC | PRN
  Administered 2014-07-23: 2000 mg via TOPICAL

## 2014-07-23 MED ORDER — ZOLPIDEM TARTRATE 5 MG PO TABS: 5.0000 mg | ORAL_TABLET | Freq: Every evening | ORAL | Status: DC | PRN

## 2014-07-23 MED ORDER — OXYCODONE HCL 5 MG PO TABS
5.0000 mg | ORAL_TABLET | ORAL | Status: DC | PRN
  Administered 2014-07-23 – 2014-07-24 (×7): 10 mg via ORAL
  Filled 2014-07-23 (×7): qty 2

## 2014-07-23 MED ORDER — CLONIDINE HCL 0.3 MG PO TABS
0.1500 mg | ORAL_TABLET | Freq: Two times a day (BID) | ORAL | Status: DC
  Administered 2014-07-23 – 2014-07-24 (×3): 0.15 mg via ORAL
  Filled 2014-07-23 (×5): qty 0.5

## 2014-07-23 MED ORDER — POTASSIUM GLUCONATE 550 MG PO TABS: 550.0000 mg | ORAL_TABLET | Freq: Every day | ORAL | Status: DC

## 2014-07-23 SURGICAL SUPPLY — 62 items
BAG SPEC THK2 15X12 ZIP CLS (MISCELLANEOUS) ×1
BAG ZIPLOCK 12X15 (MISCELLANEOUS) ×3 IMPLANT
BANDAGE ELASTIC 6 VELCRO ST LF (GAUZE/BANDAGES/DRESSINGS) ×3 IMPLANT
BANDAGE ESMARK 6X9 LF (GAUZE/BANDAGES/DRESSINGS) ×1 IMPLANT
BLADE SAG 18X100X1.27 (BLADE) ×3 IMPLANT
BLADE SAW SGTL 11.0X1.19X90.0M (BLADE) ×3 IMPLANT
BNDG CMPR 9X6 STRL LF SNTH (GAUZE/BANDAGES/DRESSINGS) ×1
BNDG ESMARK 6X9 LF (GAUZE/BANDAGES/DRESSINGS) ×3
BOWL SMART MIX CTS (DISPOSABLE) ×3 IMPLANT
CAPT RP KNEE ×2 IMPLANT
CEMENT HV SMART SET (Cement) ×6 IMPLANT
CLOSURE WOUND 1/2 X4 (GAUZE/BANDAGES/DRESSINGS) ×1
CUFF TOURN SGL QUICK 34 (TOURNIQUET CUFF) ×3
CUFF TRNQT CYL 34X4X40X1 (TOURNIQUET CUFF) ×1 IMPLANT
DECANTER SPIKE VIAL GLASS SM (MISCELLANEOUS) ×3 IMPLANT
DRAPE EXTREMITY TIBURON (DRAPES) ×3 IMPLANT
DRAPE POUCH INSTRU U-SHP 10X18 (DRAPES) ×3 IMPLANT
DRAPE U-SHAPE 47X51 STRL (DRAPES) ×3 IMPLANT
DRSG ADAPTIC 3X8 NADH LF (GAUZE/BANDAGES/DRESSINGS) ×3 IMPLANT
DRSG PAD ABDOMINAL 8X10 ST (GAUZE/BANDAGES/DRESSINGS) ×3 IMPLANT
DURAPREP 26ML APPLICATOR (WOUND CARE) ×3 IMPLANT
ELECT REM PT RETURN 9FT ADLT (ELECTROSURGICAL) ×3
ELECTRODE REM PT RTRN 9FT ADLT (ELECTROSURGICAL) ×1 IMPLANT
EVACUATOR 1/8 PVC DRAIN (DRAIN) ×3 IMPLANT
FACESHIELD WRAPAROUND (MASK) ×15 IMPLANT
FACESHIELD WRAPAROUND OR TEAM (MASK) ×5 IMPLANT
GAUZE SPONGE 4X4 12PLY STRL (GAUZE/BANDAGES/DRESSINGS) ×3 IMPLANT
GLOVE BIO SURGEON STRL SZ7.5 (GLOVE) IMPLANT
GLOVE BIO SURGEON STRL SZ8 (GLOVE) ×3 IMPLANT
GLOVE BIOGEL PI IND STRL 6.5 (GLOVE) IMPLANT
GLOVE BIOGEL PI IND STRL 8 (GLOVE) ×1 IMPLANT
GLOVE BIOGEL PI INDICATOR 6.5 (GLOVE)
GLOVE BIOGEL PI INDICATOR 8 (GLOVE) ×2
GLOVE SURG SS PI 6.5 STRL IVOR (GLOVE) IMPLANT
GOWN STRL REUS W/TWL LRG LVL3 (GOWN DISPOSABLE) ×3 IMPLANT
GOWN STRL REUS W/TWL XL LVL3 (GOWN DISPOSABLE) IMPLANT
HANDPIECE INTERPULSE COAX TIP (DISPOSABLE) ×3
IMMOBILIZER KNEE 20 (SOFTGOODS) ×5 IMPLANT
IMMOBILIZER KNEE 20 THIGH 36 (SOFTGOODS) ×1 IMPLANT
KIT BASIN OR (CUSTOM PROCEDURE TRAY) ×3 IMPLANT
MANIFOLD NEPTUNE II (INSTRUMENTS) ×3 IMPLANT
NDL SAFETY ECLIPSE 18X1.5 (NEEDLE) ×2 IMPLANT
NEEDLE HYPO 18GX1.5 SHARP (NEEDLE) ×6
NS IRRIG 1000ML POUR BTL (IV SOLUTION) ×3 IMPLANT
PACK TOTAL JOINT (CUSTOM PROCEDURE TRAY) ×3 IMPLANT
PAD ABD 8X10 STRL (GAUZE/BANDAGES/DRESSINGS) ×2 IMPLANT
PADDING CAST COTTON 6X4 STRL (CAST SUPPLIES) ×4 IMPLANT
POSITIONER SURGICAL ARM (MISCELLANEOUS) ×3 IMPLANT
SET HNDPC FAN SPRY TIP SCT (DISPOSABLE) ×1 IMPLANT
STRIP CLOSURE SKIN 1/2X4 (GAUZE/BANDAGES/DRESSINGS) ×3 IMPLANT
SUCTION FRAZIER 12FR DISP (SUCTIONS) ×3 IMPLANT
SUT MNCRL AB 4-0 PS2 18 (SUTURE) ×3 IMPLANT
SUT VIC AB 2-0 CT1 27 (SUTURE) ×9
SUT VIC AB 2-0 CT1 TAPERPNT 27 (SUTURE) ×3 IMPLANT
SUT VLOC 180 0 24IN GS25 (SUTURE) ×3 IMPLANT
SYR 20CC LL (SYRINGE) ×3 IMPLANT
SYR 50ML LL SCALE MARK (SYRINGE) ×3 IMPLANT
TOWEL OR 17X26 10 PK STRL BLUE (TOWEL DISPOSABLE) ×3 IMPLANT
TOWEL OR NON WOVEN STRL DISP B (DISPOSABLE) IMPLANT
TRAY FOLEY CATH 14FRSI W/METER (CATHETERS) ×3 IMPLANT
WATER STERILE IRR 1500ML POUR (IV SOLUTION) ×3 IMPLANT
WRAP KNEE MAXI GEL POST OP (GAUZE/BANDAGES/DRESSINGS) ×3 IMPLANT

## 2014-07-23 NOTE — Anesthesia Postprocedure Evaluation (Signed)
  Anesthesia Post-op Note  Patient: Elizabeth Wang  Procedure(s) Performed: Procedure(s) (LRB): LEFT TOTAL KNEE ARTHROPLASTY (Left)  Patient Location: PACU  Anesthesia Type: Spinal  Level of Consciousness: awake and alert   Airway and Oxygen Therapy: Patient Spontanous Breathing  Post-op Pain: mild  Post-op Assessment: Post-op Vital signs reviewed, Patient's Cardiovascular Status Stable, Respiratory Function Stable, Patent Airway and No signs of Nausea or vomiting  Last Vitals:  Filed Vitals:   07/23/14 1130  BP: 114/55  Temp:   Resp:     Post-op Vital Signs: stable   Complications: No apparent anesthesia complications

## 2014-07-23 NOTE — Op Note (Signed)
Pre-operative diagnosis- Osteoarthritis Left knee(s)  Post-operative diagnosis- Osteoarthritis  Left knee(s)  Procedure-   Left Total Knee Arthroplasty  Surgeon- Dione Plover. Samhitha Rosen, MD  Assistant- Ardeen Jourdain, PA-C   Anesthesia-  Spinal   EBL- * No blood loss amount entered *   Drains Hemovac   Tourniquet time  Total Tourniquet Time Documented: Thigh (Left) - 38 minutes Total: Thigh (Left) - 38 minutes    Complications- None  Condition-PACU - hemodynamically stable.   Brief Clinical Note  Elizabeth Wang is a 68 y.o. year old female with end stage OA of her left knee with progressively worsening pain and dysfunction. She has constant pain, with activity and at rest and significant functional deficits with difficulties even with ADLs. She has had extensive non-op management including analgesics, injections of cortisone and viscosupplements, and home exercise program, but remains in significant pain with significant dysfunction. Radiographs show bone on bone arthritis lateral and patellofemoral with severe valgus deformity. She presents now for left Total Knee Arthroplasty.    Procedure in detail---       The patient is brought into the operating room and positioned supine on the operating table. After successful administration of Spinal anesthetic, a tourniquet is placed high on the Left thigh(s) and the lower extremity is prepped and draped in the usual sterile fashion. Time out is performed by the operating team and then the Left  lower extremity is wrapped in Esmarch, knee flexed and the tourniquet inflated to 300 mmHg.       A midline incision is made with a ten blade through the subcutaneous tissue to the level of the extensor mechanism. A fresh blade is used to make a lateral parapatellar arthrotomy due to the patients' valgus deformity. Soft tissue over the proximal lateral tibia is subperiosteally elevated to the joint line with a knife to the posterolateral corner but not  including the structures of the posterolateral corner. Soft tissue over the proximal medial tibia is elevated with attention being paid to avoiding the patellar tendon on the tibial tubercle. The patella is everted medially, knee flexed 90 degrees and the ACL and PCL are removed. Findings are bone on bone lateral and patellofemoral with massive global osteophytes .       The drill is used to create a starting hole in the distal femur and the canal is thoroughly irrigated with sterile saline to remove the fatty contents. The 5 degree Left  valgus alignment guide is placed into the femoral canal and the distal femoral cutting block is pinned to remove 10  mm off the distal femur. Resection is made with an oscillating saw.      The tibia is subluxed forward and the menisci are removed. The extramedullary alignment guide is placed referencing proximally at the medial aspect of the tibial tubercle and distally along the second metatarsal axis and tibial crest. The block is pinned to remove 38mm off the more deficient lateral side. Resection is made with an oscillating saw. Size 4  is the most appropriate size for the tibia and the proximal tibia is prepared with the modular drill and keel punch for that size.      The femoral sizing guide is placed and size 5  is most appropriate. Rotation is marked off the epicondylar axis and confirmed by creating a rectangular flexion gap at 90 degrees. The size 5  cutting block is pinned in this rotation and the anterior, posterior and chamfer cuts are made with the oscillating saw.  The intercondylar block is then placed and that cut is made.      Trial size 4  tibial component, trial size 5  posterior stabilized femur and a 15  mm posterior stabilized rotating platform insert trial is placed. Full extension is achieved with excellent varus/valgus and   anterior/posterior balance throughout full range of motion. The patella is everted and thickness measured to be 24  mm. Free hand  resection is taken to 14 mm, a 38 template is placed, lug holes are drilled, trial patella is placed, and it tracks normally. Osteophytes are removed off the posterior femur with the trial in place. All trials are removed and the cut bone surfaces prepared with pulsatile lavage. Cement is mixed and once ready for implantation, the size 4  tibial implant, size 5 posterior stabilized femoral component, and the size 38  patella are cemented in place and the patella is held with the clamp. The trial insert is placed and the knee held in full extension. The Exparel (20 ml mixed with 30 ml saline) and then 20 ml of .25% Bupivicaine is injected into the extensor mechanism, posterior capsule, medial and lateral gutters and subcutaneous tissues. All extruded cement is removed and once the cement is hard the permanent 15  mm posterior stabilized rotating platform insert is placed into the tibial tray.      The wound is copiously irrigated with saline solution and the tourniquet is released for a total   tourniquet time of 38  minutes. Bleeding is identified and controlled with electrocautery. The extensor mechanism is closed with interrupted #1 V-loc leaving open a small area from the superior to inferior pole of the patella to serve as a mini lateral release. Flexion against gravity is 140  degrees and the patella tracks normally. 2 grams of tranexamic acid mixed with 50 ml saline are then injected into the joint. Subcutaneous tissue is closed with 2.0 vicryl and subcuticular with running 4.0 Monocryl.The incision is cleaned and dried and steri-strips and a bulky sterile dressing are applied. The limb is placed into a knee immobilizer and the patient is awakened and transported to recovery in stable condition.      Please note that a surgical assistant was a medical necessity for this procedure in order to perform it in a safe and expeditious manner. Surgical assistant was necessary to retract the ligaments and vital  neurovascular structures to prevent injury to them and also necessary for proper positioning of the limb to allow for anatomic placement of the prosthesis.    Dione Plover Elizabeth Vanderhoef, MD    07/23/2014, 10:40 AM

## 2014-07-23 NOTE — Anesthesia Preprocedure Evaluation (Signed)
Anesthesia Evaluation  Patient identified by MRN, date of birth, ID band Patient awake    Reviewed: Allergy & Precautions, H&P , NPO status , Patient's Chart, lab work & pertinent test results  Airway Mallampati: II TM Distance: <3 FB Neck ROM: Full    Dental no notable dental hx.    Pulmonary neg pulmonary ROS, former smoker,  breath sounds clear to auscultation  Pulmonary exam normal       Cardiovascular hypertension, Pt. on medications Rhythm:Regular Rate:Normal     Neuro/Psych negative neurological ROS  negative psych ROS   GI/Hepatic negative GI ROS, Neg liver ROS,   Endo/Other  Hypothyroidism Morbid obesity  Renal/GU negative Renal ROS  negative genitourinary   Musculoskeletal negative musculoskeletal ROS (+)   Abdominal   Peds negative pediatric ROS (+)  Hematology negative hematology ROS (+)   Anesthesia Other Findings   Reproductive/Obstetrics negative OB ROS                           Anesthesia Physical Anesthesia Plan  ASA: III  Anesthesia Plan: Spinal   Post-op Pain Management:    Induction: Intravenous  Airway Management Planned: Simple Face Mask  Additional Equipment:   Intra-op Plan:   Post-operative Plan:   Informed Consent: I have reviewed the patients History and Physical, chart, labs and discussed the procedure including the risks, benefits and alternatives for the proposed anesthesia with the patient or authorized representative who has indicated his/her understanding and acceptance.     Plan Discussed with: CRNA and Surgeon  Anesthesia Plan Comments:         Anesthesia Quick Evaluation

## 2014-07-23 NOTE — Evaluation (Signed)
Physical Therapy Evaluation Patient Details Name: Elizabeth Wang MRN: [id number] DOB: November 21, 1945 Today's Date: 07/23/2014   History of Present Illness  L TKA  Clinical Impression  **Pt is s/p TKA resulting in the deficits listed below (see PT Problem List). ** Pt will benefit from skilled PT to increase their independence and safety with mobility to allow discharge to the venue listed below.   Pt walked 25' with RW and was doing well, but then began to feel overheated. HR 101, SaO2 99% on RA, BP 156/73 right after walking. Good progress expected. Will follow during acute stay.  *    Follow Up Recommendations SNF    Equipment Recommendations  None recommended by PT    Recommendations for Other Services OT consult     Precautions / Restrictions Precautions Precautions: Knee Restrictions Weight Bearing Restrictions: No Other Position/Activity Restrictions: wbat      Mobility  Bed Mobility Overal bed mobility: Needs Assistance Bed Mobility: Supine to Sit     Supine to sit: Min guard     General bed mobility comments: for LLE  Transfers Overall transfer level: Needs assistance Equipment used: Rolling walker (2 wheeled) Transfers: Sit to/from Stand Sit to Stand: Min assist         General transfer comment: assist to power up, cues for hand placement  Ambulation/Gait Ambulation/Gait assistance: Min guard Ambulation Distance (Feet): 25 Feet Assistive device: Rolling walker (2 wheeled) Gait Pattern/deviations: Step-to pattern;Antalgic   Gait velocity interpretation: Below normal speed for age/gender General Gait Details: distance limited by pt starting to feel overheated while walking  Stairs            Wheelchair Mobility    Modified Rankin (Stroke Patients Only)       Balance Overall balance assessment: Needs assistance   Sitting balance-Leahy Scale: Good       Standing balance-Leahy Scale: Fair                                Pertinent Vitals/Pain Pain Assessment: 0-10 Pain Score: 5  Pain Location: L knee Pain Descriptors / Indicators: Sore Pain Intervention(s): Limited activity within patient's tolerance;Monitored during session;Premedicated before session;Ice applied    Home Living Family/patient expects to be discharged to:: Private residence Living Arrangements: Spouse/significant other Available Help at Discharge: Family;Available PRN/intermittently   Home Access: Stairs to enter   Entrance Stairs-Number of Steps: 1 Home Layout: Two level;1/2 bath on main level Home Equipment: Walker - 4 wheels;Walker - 2 wheels;Bedside commode Additional Comments: [name]    Prior Function Level of Independence: Independent               Hand Dominance        Extremity/Trunk Assessment   Upper Extremity Assessment: Overall WFL for tasks assessed           Lower Extremity Assessment: LLE deficits/detail   LLE Deficits / Details: 3/5 SLR, knee flexion 45* AAROM, ext -10*, ankle WNL  Cervical / Trunk Assessment: Normal  Communication   Communication: No difficulties  Cognition Arousal/Alertness: Awake/alert Behavior During Therapy: WFL for tasks assessed/performed Overall Cognitive Status: Within Functional Limits for tasks assessed                      General Comments      Exercises Total Joint Exercises Ankle Circles/Pumps: AROM;Both;10 reps;Supine Quad Sets: AROM;Left;5 reps;Supine Heel Slides: AAROM;Left;10 reps;Supine Goniometric ROM: -10-45* L knee AAROM  Assessment/Plan    PT Assessment Patient needs continued PT services  PT Diagnosis Difficulty walking;Acute pain   PT Problem List Decreased strength;Decreased range of motion;Decreased activity tolerance;Decreased balance;Pain;Decreased knowledge of use of DME;Decreased mobility  PT Treatment Interventions Functional mobility training;Stair training;Gait training;DME instruction;Therapeutic  exercise;Therapeutic activities;Patient/family education   PT Goals (Current goals can be found in the Care Plan section) Acute Rehab PT Goals Patient Stated Goal: to be able to travel PT Goal Formulation: With patient Time For Goal Achievement: 08/06/14 Potential to Achieve Goals: Good    Frequency 7X/week   Barriers to discharge        Co-evaluation               End of Session Equipment Utilized During Treatment: Gait belt Activity Tolerance: Patient limited by pain Patient left: in chair;with call bell/phone within reach;with family/visitor present Nurse Communication: Mobility status         Time: 1645-1711 PT Time Calculation (min): 26 min   Charges:   PT Evaluation $Initial PT Evaluation Tier I: 1 Procedure PT Treatments $Gait Training: 8-22 mins $Therapeutic Exercise: 8-22 mins   PT G Codes:          [name] 07/23/2014, 5:27 PM [phone]

## 2014-07-23 NOTE — Progress Notes (Signed)
Utilization review completed.  

## 2014-07-23 NOTE — Plan of Care (Signed)
Problem: Consults Goal: Diagnosis- Total Joint Replacement Left total knee     

## 2014-07-23 NOTE — Anesthesia Procedure Notes (Signed)

## 2014-07-23 NOTE — Interval H&P Note (Signed)
History and Physical Interval Note:  07/23/2014 7:00 AM  Elizabeth Wang  has presented today for surgery, with the diagnosis of left knee osteoarthritis  The various methods of treatment have been discussed with the patient and family. After consideration of risks, benefits and other options for treatment, the patient has consented to  Procedure(s): LEFT TOTAL KNEE ARTHROPLASTY (Left) as a surgical intervention .  The patient's history has been reviewed, patient examined, no change in status, stable for surgery.  I have reviewed the patient's chart and labs.  Questions were answered to the patient's satisfaction.     [name]

## 2014-07-23 NOTE — Transfer of Care (Signed)
Immediate Anesthesia Transfer of Care Note  Patient: Elizabeth Wang  Procedure(s) Performed: Procedure(s): LEFT TOTAL KNEE ARTHROPLASTY (Left)  Patient Location: PACU  Anesthesia Type:Spinal  Level of Consciousness: awake, alert  and oriented  Airway & Oxygen Therapy: Patient Spontanous Breathing and Patient connected to face mask oxygen  Post-op Assessment: Report given to PACU RN and Post -op Vital signs reviewed and stable  Post vital signs: Reviewed and stable  Complications: No apparent anesthesia complications

## 2014-07-24 ENCOUNTER — Encounter (HOSPITAL_COMMUNITY): Payer: Self-pay | Admitting: Orthopedic Surgery

## 2014-07-24 LAB — CBC
HCT: 32.5 % — ABNORMAL LOW (ref 36.0–46.0)
HEMOGLOBIN: 11.2 g/dL — AB (ref 12.0–15.0)
MCH: 32.2 pg (ref 26.0–34.0)
MCHC: 34.5 g/dL (ref 30.0–36.0)
MCV: 93.4 fL (ref 78.0–100.0)
Platelets: 171 10*3/uL (ref 150–400)
RBC: 3.48 MIL/uL — AB (ref 3.87–5.11)
RDW: 13.7 % (ref 11.5–15.5)
WBC: 13 10*3/uL — AB (ref 4.0–10.5)

## 2014-07-24 LAB — BASIC METABOLIC PANEL
Anion gap: 14 (ref 5–15)
BUN: 12 mg/dL (ref 6–23)
CO2: 25 mEq/L (ref 19–32)
Calcium: 8.9 mg/dL (ref 8.4–10.5)
Chloride: 99 mEq/L (ref 96–112)
Creatinine, Ser: 0.76 mg/dL (ref 0.50–1.10)
GFR, EST NON AFRICAN AMERICAN: 85 mL/min — AB (ref >90)
GLUCOSE: 159 mg/dL — AB (ref 70–99)
Potassium: 3.6 mEq/L — ABNORMAL LOW (ref 3.7–5.3)
Sodium: 138 mEq/L (ref 137–147)

## 2014-07-24 MED ORDER — HYDROCODONE-ACETAMINOPHEN 5-325 MG PO TABS
1.0000 | ORAL_TABLET | ORAL | Status: DC | PRN
  Administered 2014-07-24: 2 via ORAL
  Administered 2014-07-24 – 2014-07-25 (×2): 1 via ORAL
  Administered 2014-07-25 (×2): 2 via ORAL
  Filled 2014-07-24: qty 2
  Filled 2014-07-24: qty 1
  Filled 2014-07-24 (×3): qty 2

## 2014-07-24 NOTE — Progress Notes (Signed)
Clinical Social Work Department CLINICAL SOCIAL WORK PLACEMENT NOTE 07/24/2014  Patient:  Elizabeth Wang, Elizabeth Wang  Account Number:  [id number] Admit date:  07/23/2014  Clinical Social Worker:  Werner Lean, LCSW  Date/time:  07/24/2014 05:00 PM  Clinical Social Work is seeking post-discharge placement for this patient at the following level of care:      (*CSW will update this form in Epic as items are completed)     Patient/family provided with [place] Department of Clinical Social Work's list of facilities offering this level of care within the geographic area requested by the patient (or if unable, by the patient's family).  07/24/2014  Patient/family informed of their freedom to choose among providers that offer the needed level of care, that participate in Medicare, Medicaid or managed care program needed by the patient, have an available bed and are willing to accept the patient.    Patient/family informed of [facility]' ownership interest in [facility], as well as of the fact that they are under no obligation to receive care at this facility.  PASARR submitted to EDS on 07/23/2014 PASARR number received on 07/23/2014  FL2 transmitted to all facilities in geographic area requested by pt/family on  07/24/2014 FL2 transmitted to all facilities within larger geographic area on   Patient informed that his/her managed care company has contracts with or will negotiate with  certain facilities, including the following:     Patient/family informed of bed offers received:  07/24/2014 Patient chooses bed at [facility] Physician recommends and patient chooses bed at    Patient to be transferred to [place] on   Patient to be transferred to facility by  Patient and family notified of transfer on  Name of family member notified:    The following physician request were entered in Epic:   Additional Comments:  Werner Lean LCSW [phone]

## 2014-07-24 NOTE — Discharge Instructions (Addendum)
° °Dr. Frank Aluisio °Total Joint Specialist °[facility] °[address] °[place], [place] [address] °[phone] ° °TOTAL KNEE REPLACEMENT POSTOPERATIVE DIRECTIONS ° ° ° °Knee Rehabilitation, Guidelines Following Surgery  °Results after knee surgery are often greatly improved when you follow the exercise, range of motion and muscle strengthening exercises prescribed by your doctor. Safety measures are also important to protect the knee from further injury. Any time any of these exercises cause you to have increased pain or swelling in your knee joint, decrease the amount until you are comfortable again and slowly increase them. If you have problems or questions, call your caregiver or physical therapist for advice.  ° °HOME CARE INSTRUCTIONS  °Remove items at home which could result in a fall. This includes throw rugs or furniture in walking pathways.  °Continue medications as instructed at time of discharge. °You may have some home medications which will be placed on hold until you complete the course of blood thinner medication.  °You may start showering once you are discharged home but do not submerge the incision under water. Just pat the incision dry and apply a dry gauze dressing on daily. °Walk with walker as instructed.  °You may resume a sexual relationship in one month or when given the OK by  your doctor.  °· Use walker as long as suggested by your caregivers. °· Avoid periods of inactivity such as sitting longer than an hour when not asleep. This helps prevent blood clots.  °You may put full weight on your legs and walk as much as is comfortable.  °You may return to work once you are cleared by your doctor.  °Do not drive a car for 6 weeks or until released by you surgeon.  °· Do not drive while taking narcotics.  °Wear the elastic stockings for three weeks following surgery during the day but you may remove then at night. °Make sure you keep all of your appointments after your  operation with all of your doctors and caregivers. You should call the office at the above phone number and make an appointment for approximately two weeks after the date of your surgery. °Change the dressing daily and reapply a dry dressing each time. °Please pick up a stool softener and laxative for home use as long as you are requiring pain medications. °· Continue to use ice on the knee for pain and swelling from surgery. You may notice swelling that will progress down to the foot and ankle.  This is normal after surgery.  Elevate the leg when you are not up walking on it.   °It is important for you to complete the blood thinner medication as prescribed by your doctor. °· Continue to use the breathing machine which will help keep your temperature down.  It is common for your temperature to cycle up and down following surgery, especially at night when you are not up moving around and exerting yourself.  The breathing machine keeps your lungs expanded and your temperature down. ° °RANGE OF MOTION AND STRENGTHENING EXERCISES  °Rehabilitation of the knee is important following a knee injury or an operation. After just a few days of immobilization, the muscles of the thigh which control the knee become weakened and shrink (atrophy). Knee exercises are designed to build up the tone and strength of the thigh muscles and to improve knee motion. Often times heat used for twenty to thirty minutes before working out will loosen up your tissues and help with improving the   range of motion but do not use heat for the first two weeks following surgery. These exercises can be done on a training (exercise) mat, on the floor, on a table or on a bed. Use what ever works the best and is most comfortable for you Knee exercises include:  Leg Lifts - While your knee is still immobilized in a splint or cast, you can do straight leg raises. Lift the leg to 60 degrees, hold for 3 sec, and slowly lower the leg. Repeat 10-20 times 2-3  times daily. Perform this exercise against resistance later as your knee gets better.  Quad and Hamstring Sets - Tighten up the muscle on the front of the thigh (Quad) and hold for 5-10 sec. Repeat this 10-20 times hourly. Hamstring sets are done by pushing the foot backward against an object and holding for 5-10 sec. Repeat as with quad sets.  A rehabilitation program following serious knee injuries can speed recovery and prevent re-injury in the future due to weakened muscles. Contact your doctor or a physical therapist for more information on knee rehabilitation.   SKILLED REHAB INSTRUCTIONS: If the patient is transferred to a skilled rehab facility following release from the hospital, a list of the current medications will be sent to the facility for the patient to continue.  When discharged from the skilled rehab facility, please have the facility set up the patient's [place] prior to being released. Also, the skilled facility will be responsible for providing the patient with their medications at time of release from the facility to include their pain medication, the muscle relaxants, and their blood thinner medication. If the patient is still at the rehab facility at time of the two week follow up appointment, the skilled rehab facility will also need to assist the patient in arranging follow up appointment in our office and any transportation needs.  MAKE SURE YOU:  Understand these instructions.  Will watch your condition.  Will get help right away if you are not doing well or get worse.    Pick up stool softner and laxative for home. Do not submerge incision under water. May shower. Continue to use ice for pain and swelling from surgery.  Take Xarelto for two and a half more weeks, then discontinue Xarelto. Once the patient has completed the Xarelto, they may resume the 81 mg Aspirin.  When discharged from the skilled rehab facility, please have the facility set up  the patient's [place] prior to being released.  Also provide the patient with their medications at time of release from the facility to include their pain medication, the muscle relaxants, and their blood thinner medication.  If the patient is still at the rehab facility at time of follow up appointment, please also assist the patient in arranging follow up appointment in our office and any transportation needs.   Information on my medicine - XARELTO (Rivaroxaban)  This medication education was reviewed with me or my healthcare representative as part of my discharge preparation.  The pharmacist that spoke with me during my hospital stay was:  Angela Adam [facility]  Why was Xarelto prescribed for you? Xarelto was prescribed for you to reduce the risk of blood clots forming after orthopedic surgery. The medical term for these abnormal blood clots is venous thromboembolism (VTE).  What do you need to know about xarelto ? Take your Xarelto ONCE DAILY at the same time every day. You may take it either with or  either with or without food. ° °If you have difficulty swallowing the tablet whole, you may crush it and mix in applesauce just prior to taking your dose. ° °Take Xarelto® exactly as prescribed by your doctor and DO NOT stop taking Xarelto® without talking to the doctor who prescribed the medication.  Stopping without other VTE prevention medication to take the place of Xarelto® may increase your risk of developing a clot. ° °After discharge, you should have regular check-up appointments with your healthcare provider that is prescribing your Xarelto®.   ° °What do you do if you miss a dose? °If you miss a dose, take it as soon as you remember on the same day then continue your regularly scheduled once daily regimen the next day. Do not take two doses of Xarelto® on the same day.  ° °Important Safety Information °A possible side effect of Xarelto® is bleeding. You should call your  healthcare provider right away if you experience any of the following: °  Bleeding from an injury or your nose that does not stop. °  Unusual colored urine (red or dark brown) or unusual colored stools (red or black). °  Unusual bruising for unknown reasons. °  A serious fall or if you hit your head (even if there is no bleeding). ° °Some medicines may interact with Xarelto® and might increase your risk of bleeding while on Xarelto®. To help avoid this, consult your healthcare provider or pharmacist prior to using any new prescription or non-prescription medications, including herbals, vitamins, non-steroidal anti-inflammatory drugs (NSAIDs) and supplements. ° °This website has more information on Xarelto®: www.xarelto.com. ° ° °

## 2014-07-24 NOTE — Progress Notes (Signed)
  Subjective: 1 Day Post-Op Procedure(s) (LRB): LEFT TOTAL KNEE ARTHROPLASTY (Left) Patient reports pain as mild.   Patient seen in rounds with Dr. Wynelle Link. Patient is well, but has had some minor complaints of pain in the knee, requiring pain medications We will start therapy today.  Plan is to go [facility] after hospital stay.  Objective: Vital signs in last 24 hours: Temp:  [97.1 F (36.2 C)-98.4 F (36.9 C)] 98.4 F (36.9 C) (09/29 0453) Pulse Rate:  [66-101] 70 (09/29 0453) Resp:  [14-19] 16 (09/29 0453) BP: (105-161)/(53-82) 161/74 mmHg (09/29 0453) SpO2:  [98 %-100 %] 98 % (09/29 0453)  Intake/Output from previous day:  Intake/Output Summary (Last 24 hours) at 07/24/14 0847 Last data filed at 07/24/14 0700  Gross per 24 hour  Intake 4791.25 ml  Output   3210 ml  Net 1581.25 ml    Intake/Output this shift: UOP 825 since MN  Labs:  Recent Labs  07/24/14 0400  HGB 11.2*    Recent Labs  07/24/14 0400  WBC 13.0*  RBC 3.48*  HCT 32.5*  PLT 171    Recent Labs  07/24/14 0400  NA 138  K 3.6*  CL 99  CO2 25  BUN 12  CREATININE 0.76  GLUCOSE 159*  CALCIUM 8.9   No results found for this basename: LABPT, INR,  in the last 72 hours  EXAM General - Patient is Alert, Appropriate and Oriented Extremity - Neurovascular intact Sensation intact distally Dorsiflexion/Plantar flexion intact Dressing - dressing C/D/I Motor Function - intact, moving foot and toes well on exam.  Hemovac pulled without difficulty.  Past Medical History  Diagnosis Date  . Hypertension   . Clotting disorder     DVT/PE  . Obesity   . Hypothyroidism   . Esophageal reflux   . Dyslipidemia   . Diverticulosis     minor   . Acute pulmonary embolism 06/2013  . Dysrhythmia     AFIB 2014  . Numbness     TOES  . Arthritis   . Pseudogout   . Sleep apnea     "not bad enough to use c- pap"  . Asthma     years ago     Assessment/Plan: 1 Day Post-Op  Procedure(s) (LRB): LEFT TOTAL KNEE ARTHROPLASTY (Left) Active Problems:   OA (osteoarthritis) of knee  Estimated body mass index is 35.51 kg/(m^2) as calculated from the following:   Height as of this encounter: 5\' 4"  (1.626 m).   Weight as of this encounter: 93.895 kg (207 lb). Advance diet Up with therapy Discharge to SNF - Plan for [facility] or home is does very well.  DVT Prophylaxis - Xarelto - Patient also takes Cardizem but was on Xarelto in the past with previous DVT and PE without issues or difficulty.  Will monitor.  On low dose at this time. Weight-Bearing as tolerated to left leg D/C O2 and Pulse OX and try on Room Air  Arlee Muslim, PA-C Orthopaedic Surgery 07/24/2014, 8:47 AM

## 2014-07-24 NOTE — Evaluation (Signed)
Occupational Therapy Evaluation Patient Details Name: Elizabeth Wang MRN: [id number] DOB: 1946-04-20 Today's Date: 07/24/2014    History of Present Illness L TKA   Clinical Impression   This 68 year old female was admitted for the above surgery.  She is overall min A for adls and will benefit from skilled OT.  Goals in acute are for supervision level.    Follow Up Recommendations  SNF    Equipment Recommendations  None recommended by OT    Recommendations for Other Services       Precautions / Restrictions Precautions Precautions: Knee Required Braces or Orthoses: Knee Immobilizer - Left Restrictions Weight Bearing Restrictions: No      Mobility Bed Mobility   Bed Mobility: Supine to Sit     Supine to sit: Min guard     General bed mobility comments: using rail  Transfers   Equipment used: Rolling walker (2 wheeled) Transfers: Sit to/from Stand Sit to Stand: Min guard         General transfer comment: for safety: cues for LLE    Balance                                            ADL Overall ADL's : Needs assistance/impaired             Lower Body Bathing: Minimal assistance;Sit to/from stand       Lower Body Dressing: Minimal assistance;Sit to/from stand   Toilet Transfer: Min guard;Stand-pivot (to recliner)             General ADL Comments: pt performed bathing from EOB. UB set up for adls.       Vision                     Perception     Praxis      Pertinent Vitals/Pain Pain Assessment: 0-10 Pain Score: 2  Pain Location: L knee Pain Descriptors / Indicators: Aching Pain Intervention(s): Limited activity within patient's tolerance;Monitored during session;Ice applied;Premedicated before session;Repositioned     Hand Dominance     Extremity/Trunk Assessment Upper Extremity Assessment Upper Extremity Assessment: Overall WFL for tasks assessed           Communication  Communication Communication: No difficulties   Cognition Arousal/Alertness: Awake/alert Behavior During Therapy: WFL for tasks assessed/performed Overall Cognitive Status: Within Functional Limits for tasks assessed                     General Comments       Exercises       Shoulder Instructions      Home Living Family/patient expects to be discharged to:: Skilled nursing facility Living Arrangements: Spouse/significant other Available Help at Discharge: Family;Available PRN/intermittently                         Home Equipment: Walker - 4 wheels;Walker - 2 wheels;Bedside commode   Additional Comments: [name]      Prior Functioning/Environment Level of Independence: Independent             OT Diagnosis: Generalized weakness   OT Problem List: Decreased strength;Decreased activity tolerance;Decreased knowledge of use of DME or AE;Pain   OT Treatment/Interventions: Self-care/ADL training;DME and/or AE instruction;Patient/family education    OT Goals(Current goals can be found in the care plan section) Acute Rehab OT  Goals Patient Stated Goal: to be able to travel OT Goal Formulation: With patient Time For Goal Achievement: 07/31/14 Potential to Achieve Goals: Good ADL Goals Pt Will Perform Lower Body Bathing: with supervision;sit to/from stand Pt Will Perform Lower Body Dressing: with supervision;with adaptive equipment;sit to/from stand Pt Will Transfer to Toilet: with supervision;ambulating;bedside commode Pt Will Perform Toileting - Clothing Manipulation and hygiene: with supervision;sit to/from stand  OT Frequency: Min 2X/week   Barriers to D/C:            Co-evaluation              End of Session CPM Left Knee CPM Left Knee: Off  Activity Tolerance: Patient tolerated treatment well Patient left: in chair;with call bell/phone within reach;with family/visitor present   Time: 3888-2800 OT Time Calculation (min): 20  min Charges:  OT General Charges $OT Visit: 1 Procedure OT Evaluation $Initial OT Evaluation Tier I: 1 Procedure OT Treatments $Self Care/Home Management : 8-22 mins G-Codes:    Cindra Austad 20-Aug-2014, 9:35 AM  Lesle Chris, OTR/L [phone] 2014-08-20

## 2014-07-24 NOTE — Care Management Note (Signed)
    Page 1 of 1   07/24/2014     2:17:11 PM CARE MANAGEMENT NOTE 07/24/2014  Patient:  CAMIE, HAUSS   Account Number:  [id number]  Date Initiated:  07/24/2014  Documentation initiated by:  [facility]  Subjective/Objective Assessment:   adm: LEFT TOTAL KNEE ARTHROPLASTY (Left)     Action/Plan:   discharge planning   Anticipated DC Date:  07/24/2014   Anticipated DC Plan:  SKILLED NURSING FACILITY         Choice offered to / List presented to:             Status of service:  Completed, signed off Medicare Important Message given?  NA - LOS <3 / Initial given by admissions (If response is "NO", the following Medicare IM given date fields will be blank) Date Medicare IM given:   Medicare IM given by:   Date Additional Medicare IM given:   Additional Medicare IM given by:    Discharge Disposition:  [place]  Per UR Regulation:    If discussed at Long Length of Stay Meetings, dates discussed:    Comments:  07/24/14 14:10 CM notes pt to go to SNF for rehab; CSW arranging.  No other CM needs were communicated.  Mariane Masters, BSN, CM [phone].

## 2014-07-24 NOTE — Progress Notes (Signed)
Physical Therapy Treatment Patient Details Name: Elizabeth Wang MRN: [id number] DOB: December 16, 1945 Today's Date: 07/24/2014    History of Present Illness L TKA    PT Comments    **Pt progressing well with mobility, she walked 130' with RW. Expect she'll be ready to DC to ST-SNF tomorrow from PT standpoint. *  Follow Up Recommendations  SNF     Equipment Recommendations  None recommended by PT    Recommendations for Other Services OT consult     Precautions / Restrictions Precautions Precautions: Knee Restrictions Weight Bearing Restrictions: No Other Position/Activity Restrictions: wbat    Mobility  Bed Mobility Overal bed mobility: Needs Assistance Bed Mobility: Sit to Supine       Sit to supine: Min assist   General bed mobility comments: for LLE  Transfers Overall transfer level: Needs assistance Equipment used: Rolling walker (2 wheeled) Transfers: Sit to/from Stand Sit to Stand: Supervision         General transfer comment:  cues for hand placement  Ambulation/Gait Ambulation/Gait assistance: Supervision Ambulation Distance (Feet): 130 Feet Assistive device: Rolling walker (2 wheeled) Gait Pattern/deviations: Step-to pattern;Antalgic   Gait velocity interpretation: Below normal speed for age/gender General Gait Details: good sequencing and posture   Stairs            Wheelchair Mobility    Modified Rankin (Stroke Patients Only)       Balance                                    Cognition Arousal/Alertness: Awake/alert Behavior During Therapy: WFL for tasks assessed/performed Overall Cognitive Status: Within Functional Limits for tasks assessed                      Exercises Total Joint Exercises Ankle Circles/Pumps: AROM;Both;10 reps;Supine   Heel Slides: AAROM;Left;15 reps;Supine  Goniometric ROM: -10-55*    General Comments        Pertinent Vitals/Pain Pain Score: 5  Pain Location: L knee with  walking Pain Descriptors / Indicators: Sore Pain Intervention(s): Monitored during session;Premedicated before session;Ice applied    Home Living                      Prior Function            PT Goals (current goals can now be found in the care plan section) Acute Rehab PT Goals Patient Stated Goal: to be able to travel PT Goal Formulation: With patient Time For Goal Achievement: 08/06/14 Potential to Achieve Goals: Good Progress towards PT goals: Progressing toward goals    Frequency  7X/week    PT Plan Current plan remains appropriate    Co-evaluation             End of Session Equipment Utilized During Treatment: Gait belt Activity Tolerance: Patient tolerated treatment well Patient left: with call bell/phone within reach;with family/visitor present;in bed     Time: 1437-1500 PT Time Calculation (min): 23 min  Charges:  $Gait Training: 8-22 mins $Therapeutic Exercise: 8-22 mins $Therapeutic Activity: 8-22 mins                    G Codes:      [name] 07/24/2014, 3:13 PM [phone]

## 2014-07-24 NOTE — Progress Notes (Signed)
Physical Therapy Treatment Patient Details Name: Elizabeth Wang MRN: [id number] DOB: 1945/12/25 Today's Date: 07/24/2014    History of Present Illness L TKA    PT Comments    *Pt is progressing well with mobility. She walked 110' with RW and performed L TKA exercises with min A. I expect she'll be ready to DC to ST-SNF tomorrow.  **  Follow Up Recommendations  SNF     Equipment Recommendations  None recommended by PT    Recommendations for Other Services OT consult     Precautions / Restrictions Precautions Precautions: Knee Required Braces or Orthoses: Knee Immobilizer - Left Restrictions Weight Bearing Restrictions: No    Mobility  Bed Mobility Overal bed mobility: Needs Assistance Bed Mobility: Supine to Sit     Supine to sit: Min guard     General bed mobility comments: for LLE  Transfers Overall transfer level: Needs assistance Equipment used: Rolling walker (2 wheeled) Transfers: Sit to/from Stand Sit to Stand: Supervision         General transfer comment:  cues for hand placement  Ambulation/Gait Ambulation/Gait assistance: Supervision Ambulation Distance (Feet): 110 Feet Assistive device: Rolling walker (2 wheeled) Gait Pattern/deviations: Step-to pattern;Antalgic   Gait velocity interpretation: Below normal speed for age/gender General Gait Details: good sequencing and posture   Stairs            Wheelchair Mobility    Modified Rankin (Stroke Patients Only)       Balance                                    Cognition Arousal/Alertness: Awake/alert Behavior During Therapy: WFL for tasks assessed/performed Overall Cognitive Status: Within Functional Limits for tasks assessed                      Exercises Total Joint Exercises Ankle Circles/Pumps: AROM;Both;10 reps;Supine Quad Sets: AROM;Left;5 reps;Supine Short Arc Quad: AROM;Left;10 reps Heel Slides: AAROM;Left;10 reps;Supine Hip ABduction/ADduction:  AROM;Left;10 reps;Supine Straight Leg Raises: AROM;Left;10 reps;Supine Goniometric ROM: -10-55*    General Comments        Pertinent Vitals/Pain Pain Assessment: 0-10 Pain Score: 6  Pain Location: L knee walking Pain Descriptors / Indicators: Sore Pain Intervention(s): Monitored during session;Limited activity within patient's tolerance;Premedicated before session;Ice applied    Home Living Family/patient expects to be discharged to:: Skilled nursing facility Living Arrangements: Spouse/significant other Available Help at Discharge: Family;Available PRN/intermittently         Home Equipment: Walker - 4 wheels;Walker - 2 wheels;Bedside commode Additional Comments: Elizabeth Wang    Prior Function Level of Independence: Independent          PT Goals (current goals can now be found in the care plan section) Acute Rehab PT Goals Patient Stated Goal: to be able to travel PT Goal Formulation: With patient Time For Goal Achievement: 08/06/14 Potential to Achieve Goals: Good Progress towards PT goals: Progressing toward goals    Frequency  7X/week    PT Plan Current plan remains appropriate    Co-evaluation             End of Session Equipment Utilized During Treatment: Gait belt Activity Tolerance: Patient tolerated treatment well Patient left: in chair;with call bell/phone within reach;with family/visitor present     Time: 1140-1205 PT Time Calculation (min): 25 min  Charges:  $Gait Training: 8-22 mins $Therapeutic Exercise: 8-22 mins  G Codes:      Elizabeth Wang 07/24/2014, 12:45 PM [phone]

## 2014-07-24 NOTE — Progress Notes (Signed)
Clinical Social Work Department BRIEF PSYCHOSOCIAL ASSESSMENT 07/24/2014  Patient:  Elizabeth Wang, Elizabeth Wang     Account Number:  [id number]     Admit date:  07/23/2014  Clinical Social Worker:  Lacie Scotts  Date/Time:  07/24/2014 04:27 PM  Referred by:  Physician  Date Referred:  07/24/2014 Referred for  SNF Placement   Other Referral:   Interview type:  Patient Other interview type:    PSYCHOSOCIAL DATA Living Status:  HUSBAND Admitted from facility:   Level of care:   Primary support name:  [name] Primary support relationship to patient:  SPOUSE Degree of support available:   supportive    CURRENT CONCERNS Current Concerns  Post-Acute Placement   Other Concerns:    SOCIAL WORK ASSESSMENT / PLAN Pt is a 68 yr old female living at home prior to hospitalization. CSW met with pt to assist with d/c planning. This is a planned admission. Pt has made prior arrangements to have ST Rehab at [facility] following hospital d/c . CSW has contacted SNF and d/c plan has been confirmed. CSW has contacted [employer] and insurance has provided authorization for [employer] at [employer].   Assessment/plan status:   Other assessment/ plan:   Information/referral to community resources:   Insurance coverage for SNF and ambulance transport reviewed.    PATIENT'S/FAMILY'S RESPONSE TO PLAN OF CARE: Pt had a fairly good night following surgery. Her mood has been bright and she is motivated to work with therapy.    Werner Lean LCSW [phone]

## 2014-07-25 LAB — BASIC METABOLIC PANEL
ANION GAP: 10 (ref 5–15)
BUN: 15 mg/dL (ref 6–23)
CHLORIDE: 99 meq/L (ref 96–112)
CO2: 27 meq/L (ref 19–32)
Calcium: 8.6 mg/dL (ref 8.4–10.5)
Creatinine, Ser: 0.74 mg/dL (ref 0.50–1.10)
GFR calc Af Amer: >90 mL/min (ref >90)
GFR calc non Af Amer: 85 mL/min — ABNORMAL LOW (ref >90)
Glucose, Bld: 141 mg/dL — ABNORMAL HIGH (ref 70–99)
Potassium: 3.9 mEq/L (ref 3.7–5.3)
SODIUM: 136 meq/L — AB (ref 137–147)

## 2014-07-25 LAB — CBC
HCT: 29.7 % — ABNORMAL LOW (ref 36.0–46.0)
Hemoglobin: 10 g/dL — ABNORMAL LOW (ref 12.0–15.0)
MCH: 31.7 pg (ref 26.0–34.0)
MCHC: 33.7 g/dL (ref 30.0–36.0)
MCV: 94.3 fL (ref 78.0–100.0)
Platelets: 152 10*3/uL (ref 150–400)
RBC: 3.15 MIL/uL — ABNORMAL LOW (ref 3.87–5.11)
RDW: 13.8 % (ref 11.5–15.5)
WBC: 13 10*3/uL — AB (ref 4.0–10.5)

## 2014-07-25 MED ORDER — RIVAROXABAN 10 MG PO TABS
10.0000 mg | ORAL_TABLET | Freq: Every day | ORAL | Status: DC
Start: 2014-07-25 — End: 2015-10-30

## 2014-07-25 MED ORDER — HYDROCODONE-ACETAMINOPHEN 5-325 MG PO TABS: 1.0000 | ORAL_TABLET | ORAL | Status: DC | PRN

## 2014-07-25 MED ORDER — DSS 100 MG PO CAPS: 100.0000 mg | ORAL_CAPSULE | Freq: Two times a day (BID) | ORAL | Status: DC

## 2014-07-25 MED ORDER — TRAMADOL HCL 50 MG PO TABS: 50.0000 mg | ORAL_TABLET | Freq: Four times a day (QID) | ORAL | Status: DC | PRN

## 2014-07-25 MED ORDER — METHOCARBAMOL 500 MG PO TABS: 500.0000 mg | ORAL_TABLET | Freq: Four times a day (QID) | ORAL | Status: DC | PRN

## 2014-07-25 NOTE — Progress Notes (Signed)
Pt has a rehab bed at [facility] today if stable for d/c. [employer] has provided authorization for placement.  Roselyn Reef Gracelin Weisberg LCSW [phone]

## 2014-07-25 NOTE — Progress Notes (Signed)
   Subjective: 2 Days Post-Op Procedure(s) (LRB): LEFT TOTAL KNEE ARTHROPLASTY (Left) Patient reports pain as mild.   Patient seen in rounds with Dr. Wynelle Link. Patient is well, and has had no acute complaints or problems Patient is ready to go to [facility] today.  Objective: Vital signs in last 24 hours: Temp:  [98.4 F (36.9 C)-99 F (37.2 C)] 98.9 F (37.2 C) (09/30 0443) Pulse Rate:  [70-75] 70 (09/30 0443) Resp:  [16] 16 (09/30 0443) BP: (136-149)/(60-72) 136/60 mmHg (09/30 0443) SpO2:  [97 %-100 %] 97 % (09/30 0443)  Intake/Output from previous day:  Intake/Output Summary (Last 24 hours) at 07/25/14 0914 Last data filed at 07/25/14 0913  Gross per 24 hour  Intake   2310 ml  Output   4070 ml  Net  -1760 ml    Intake/Output this shift: Total I/O In: 360 [P.O.:360] Out: 700 [Urine:700]  Labs:  Recent Labs  07/24/14 0400 07/25/14 0402  HGB 11.2* 10.0*    Recent Labs  07/24/14 0400 07/25/14 0402  WBC 13.0* 13.0*  RBC 3.48* 3.15*  HCT 32.5* 29.7*  PLT 171 152    Recent Labs  07/24/14 0400 07/25/14 0402  NA 138 136*  K 3.6* 3.9  CL 99 99  CO2 25 27  BUN 12 15  CREATININE 0.76 0.74  GLUCOSE 159* 141*  CALCIUM 8.9 8.6   No results found for this basename: LABPT, INR,  in the last 72 hours  EXAM: General - Patient is Alert, Appropriate and Oriented Extremity - Neurovascular intact Sensation intact distally Dorsiflexion/Plantar flexion intact Incision - clean, dry, no drainage Motor Function - intact, moving foot and toes well on exam.   Assessment/Plan: 2 Days Post-Op Procedure(s) (LRB): LEFT TOTAL KNEE ARTHROPLASTY (Left) Procedure(s) (LRB): LEFT TOTAL KNEE ARTHROPLASTY (Left) Past Medical History  Diagnosis Date  . Hypertension   . Clotting disorder     DVT/PE  . Obesity   . Hypothyroidism   . Esophageal reflux   . Dyslipidemia   . Diverticulosis     minor   . Acute pulmonary embolism 06/2013  . Dysrhythmia     AFIB 2014  .  Numbness     TOES  . Arthritis   . Pseudogout   . Sleep apnea     "not bad enough to use c- pap"  . Asthma     years ago    Active Problems:   OA (osteoarthritis) of knee  Estimated body mass index is 35.51 kg/(m^2) as calculated from the following:   Height as of this encounter: 5\' 4"  (1.626 m).   Weight as of this encounter: 93.895 kg (207 lb). Up with therapy Discharge to SNF - [facility] Follow up - in 2 weeks on Tuesday Oct 13th Activity - WBAT Disposition - [place] Upon Discharge - [place] D/C Meds - See DC Summary DVT Prophylaxis - Xarelto  Arlee Muslim, PA-C Orthopaedic Surgery 07/25/2014, 9:14 AM

## 2014-07-25 NOTE — Progress Notes (Signed)
Clinical Social Work Department CLINICAL SOCIAL WORK PLACEMENT NOTE 07/25/2014  Patient:  Elizabeth Wang, Elizabeth Wang  Account Number:  [id number] Admit date:  07/23/2014  Clinical Social Worker:  Werner Lean, LCSW  Date/time:  07/24/2014 05:00 PM  Clinical Social Work is seeking post-discharge placement for this patient at the following level of care:      (*CSW will update this form in Epic as items are completed)     Patient/family provided with [place] Department of Clinical Social Work's list of facilities offering this level of care within the geographic area requested by the patient (or if unable, by the patient's family).  07/24/2014  Patient/family informed of their freedom to choose among providers that offer the needed level of care, that participate in Medicare, Medicaid or managed care program needed by the patient, have an available bed and are willing to accept the patient.    Patient/family informed of [facility]' ownership interest in [facility], as well as of the fact that they are under no obligation to receive care at this facility.  PASARR submitted to EDS on 07/23/2014 PASARR number received on 07/23/2014  FL2 transmitted to all facilities in geographic area requested by pt/family on  07/24/2014 FL2 transmitted to all facilities within larger geographic area on   Patient informed that his/her managed care company has contracts with or will negotiate with  certain facilities, including the following:     Patient/family informed of bed offers received:  07/24/2014 Patient chooses bed at [place] Physician recommends and patient chooses bed at    Patient to be transferred to [facility] on  07/25/2014 Patient to be transferred to facility by [facility] Patient and family notified of transfer on 07/25/2014 Name of family member notified:  pt declined csw assistance.  The following physician request were entered in Epic:   Additional Comments: Pt is in  agreement with d/c to SNF today. [facility] transport required. NSG reviewed d/c summary, scripts, avs. Scripts included in d/c packet.  Werner Lean LCSW [phone]

## 2014-07-25 NOTE — Progress Notes (Signed)
Report called to liz at [facility], assessment unchanged  D Rosine Abe

## 2014-07-25 NOTE — Discharge Summary (Signed)
Physician Discharge Summary   Patient ID: Elizabeth Wang MRN: [id number] DOB/AGE: 06/02/1946 68 y.o.  Admit date: 07/23/2014 Discharge date: 07/25/2014  Primary Diagnosis:  Osteoarthritis Left knee(s)  Admission Diagnoses:  Past Medical History  Diagnosis Date  . Hypertension   . Clotting disorder     DVT/PE  . Obesity   . Hypothyroidism   . Esophageal reflux   . Dyslipidemia   . Diverticulosis     minor   . Acute pulmonary embolism 06/2013  . Dysrhythmia     AFIB 2014  . Numbness     TOES  . Arthritis   . Pseudogout   . Sleep apnea     "not bad enough to use c- pap"  . Asthma     years ago    Discharge Diagnoses:   Active Problems:   OA (osteoarthritis) of knee  Estimated body mass index is 35.51 kg/(m^2) as calculated from the following:   Height as of this encounter: 5' 4"  (1.626 m).   Weight as of this encounter: 93.895 kg (207 lb).  Procedure:  Procedure(s) (LRB): LEFT TOTAL KNEE ARTHROPLASTY (Left)   Consults: None  HPI: Elizabeth Wang is a 68 y.o. year old female with end stage OA of her left knee with progressively worsening pain and dysfunction. She has constant pain, with activity and at rest and significant functional deficits with difficulties even with ADLs. She has had extensive non-op management including analgesics, injections of cortisone and viscosupplements, and home exercise program, but remains in significant pain with significant dysfunction. Radiographs show bone on bone arthritis lateral and patellofemoral with severe valgus deformity. She presents now for left Total Knee Arthroplasty.  Laboratory Data: Admission on 07/23/2014  Component Date Value Ref Range Status  . ABO/RH(D) 07/23/2014 A POS   Final  . Antibody Screen 07/23/2014 NEG   Final  . Sample Expiration 07/23/2014 07/26/2014   Final  . WBC 07/24/2014 13.0* 4.0 - 10.5 K/uL Final  . RBC 07/24/2014 3.48* 3.87 - 5.11 MIL/uL Final  . Hemoglobin 07/24/2014 11.2* 12.0 - 15.0 g/dL Final  .  HCT 07/24/2014 32.5* 36.0 - 46.0 % Final  . MCV 07/24/2014 93.4  78.0 - 100.0 fL Final  . MCH 07/24/2014 32.2  26.0 - 34.0 pg Final  . MCHC 07/24/2014 34.5  30.0 - 36.0 g/dL Final  . RDW 07/24/2014 13.7  11.5 - 15.5 % Final  . Platelets 07/24/2014 171  150 - 400 K/uL Final  . Sodium 07/24/2014 138  137 - 147 mEq/L Final  . Potassium 07/24/2014 3.6* 3.7 - 5.3 mEq/L Final  . Chloride 07/24/2014 99  96 - 112 mEq/L Final  . CO2 07/24/2014 25  19 - 32 mEq/L Final  . Glucose, Bld 07/24/2014 159* 70 - 99 mg/dL Final  . BUN 07/24/2014 12  6 - 23 mg/dL Final  . Creatinine, Ser 07/24/2014 0.76  0.50 - 1.10 mg/dL Final  . Calcium 07/24/2014 8.9  8.4 - 10.5 mg/dL Final  . GFR calc non Af Amer 07/24/2014 85* >90 mL/min Final  . GFR calc Af Amer 07/24/2014 >90  >90 mL/min Final   Comment: (NOTE)                          The eGFR has been calculated using the CKD EPI equation.                          This calculation  has not been validated in all clinical situations.                          eGFR's persistently <90 mL/min signify possible Chronic Kidney                          Disease.  . Anion gap 07/24/2014 14  5 - 15 Final  . WBC 07/25/2014 13.0* 4.0 - 10.5 K/uL Final  . RBC 07/25/2014 3.15* 3.87 - 5.11 MIL/uL Final  . Hemoglobin 07/25/2014 10.0* 12.0 - 15.0 g/dL Final  . HCT 07/25/2014 29.7* 36.0 - 46.0 % Final  . MCV 07/25/2014 94.3  78.0 - 100.0 fL Final  . MCH 07/25/2014 31.7  26.0 - 34.0 pg Final  . MCHC 07/25/2014 33.7  30.0 - 36.0 g/dL Final  . RDW 07/25/2014 13.8  11.5 - 15.5 % Final  . Platelets 07/25/2014 152  150 - 400 K/uL Final  . Sodium 07/25/2014 136* 137 - 147 mEq/L Final  . Potassium 07/25/2014 3.9  3.7 - 5.3 mEq/L Final  . Chloride 07/25/2014 99  96 - 112 mEq/L Final  . CO2 07/25/2014 27  19 - 32 mEq/L Final  . Glucose, Bld 07/25/2014 141* 70 - 99 mg/dL Final  . BUN 07/25/2014 15  6 - 23 mg/dL Final  . Creatinine, Ser 07/25/2014 0.74  0.50 - 1.10 mg/dL Final  . Calcium  07/25/2014 8.6  8.4 - 10.5 mg/dL Final  . GFR calc non Af Amer 07/25/2014 85* >90 mL/min Final  . GFR calc Af Amer 07/25/2014 >90  >90 mL/min Final   Comment: (NOTE)                          The eGFR has been calculated using the CKD EPI equation.                          This calculation has not been validated in all clinical situations.                          eGFR's persistently <90 mL/min signify possible Chronic Kidney                          Disease.  Georgiann Hahn gap 07/25/2014 10  5 - 15 Final  Hospital Outpatient Visit on 07/18/2014  Component Date Value Ref Range Status  . aPTT 07/18/2014 25  24 - 37 seconds Final  . WBC 07/18/2014 5.6  4.0 - 10.5 K/uL Final  . RBC 07/18/2014 3.99  3.87 - 5.11 MIL/uL Final  . Hemoglobin 07/18/2014 12.7  12.0 - 15.0 g/dL Final  . HCT 07/18/2014 37.0  36.0 - 46.0 % Final  . MCV 07/18/2014 92.7  78.0 - 100.0 fL Final  . MCH 07/18/2014 31.8  26.0 - 34.0 pg Final  . MCHC 07/18/2014 34.3  30.0 - 36.0 g/dL Final  . RDW 07/18/2014 13.5  11.5 - 15.5 % Final  . Platelets 07/18/2014 191  150 - 400 K/uL Final  . Sodium 07/18/2014 135* 137 - 147 mEq/L Final  . Potassium 07/18/2014 4.2  3.7 - 5.3 mEq/L Final  . Chloride 07/18/2014 96  96 - 112 mEq/L Final  . CO2 07/18/2014 26  19 - 32 mEq/L Final  . Glucose, Bld  07/18/2014 94  70 - 99 mg/dL Final  . BUN 07/18/2014 17  6 - 23 mg/dL Final  . Creatinine, Ser 07/18/2014 0.81  0.50 - 1.10 mg/dL Final  . Calcium 07/18/2014 9.5  8.4 - 10.5 mg/dL Final  . Total Protein 07/18/2014 6.9  6.0 - 8.3 g/dL Final  . Albumin 07/18/2014 3.7  3.5 - 5.2 g/dL Final  . AST 07/18/2014 30  0 - 37 U/L Final  . ALT 07/18/2014 30  0 - 35 U/L Final  . Alkaline Phosphatase 07/18/2014 75  39 - 117 U/L Final  . Total Bilirubin 07/18/2014 0.5  0.3 - 1.2 mg/dL Final  . GFR calc non Af Amer 07/18/2014 73* >90 mL/min Final  . GFR calc Af Amer 07/18/2014 85* >90 mL/min Final   Comment: (NOTE)                          The eGFR has been  calculated using the CKD EPI equation.                          This calculation has not been validated in all clinical situations.                          eGFR's persistently <90 mL/min signify possible Chronic Kidney                          Disease.  . Anion gap 07/18/2014 13  5 - 15 Final  . Prothrombin Time 07/18/2014 12.6  11.6 - 15.2 seconds Final  . INR 07/18/2014 0.94  0.00 - 1.49 Final  . Color, Urine 07/18/2014 YELLOW  YELLOW Final  . APPearance 07/18/2014 CLEAR  CLEAR Final  . Specific Gravity, Urine 07/18/2014 1.010  1.005 - 1.030 Final  . pH 07/18/2014 6.0  5.0 - 8.0 Final  . Glucose, UA 07/18/2014 NEGATIVE  NEGATIVE mg/dL Final  . Hgb urine dipstick 07/18/2014 SMALL* NEGATIVE Final  . Bilirubin Urine 07/18/2014 NEGATIVE  NEGATIVE Final  . Ketones, ur 07/18/2014 NEGATIVE  NEGATIVE mg/dL Final  . Protein, ur 07/18/2014 NEGATIVE  NEGATIVE mg/dL Final  . Urobilinogen, UA 07/18/2014 0.2  0.0 - 1.0 mg/dL Final  . Nitrite 07/18/2014 NEGATIVE  NEGATIVE Final  . Leukocytes, UA 07/18/2014 NEGATIVE  NEGATIVE Final  . MRSA, PCR 07/18/2014 NEGATIVE  NEGATIVE Final  . Staphylococcus aureus 07/18/2014 POSITIVE* NEGATIVE Final   Comment:                                 The Xpert SA Assay (FDA                          approved for NASAL specimens                          in patients over 21 years of age),                          is one component of                          a comprehensive surveillance  program.  Test performance has                          been validated by [facility] for patients greater                          than or equal to 46 year old.                          It is not intended                          to diagnose infection nor to                          guide or monitor treatment.  . Squamous Epithelial / LPF 07/18/2014 RARE  RARE Final  . WBC, UA 07/18/2014 0-2  <3 WBC/hpf Final  . RBC / HPF 07/18/2014  0-2  <3 RBC/hpf Final     X-Rays:No results found.  EKG: Orders placed during the hospital encounter of 12/27/13  . EKG 12-LEAD  . EKG 12-LEAD     Hospital Course: Elizabeth Wang is a 68 y.o. who was admitted to [facility]. They were brought to the operating room on 07/23/2014 and underwent Procedure(s): LEFT TOTAL KNEE ARTHROPLASTY.  Patient tolerated the procedure well and was later transferred to the recovery room and then to the orthopaedic floor for postoperative care.  They were given PO and IV analgesics for pain control following their surgery.  They were given 24 hours of postoperative antibiotics of  Anti-infectives   Start     Dose/Rate Route Frequency Ordered Stop   07/23/14 1530  ceFAZolin (ANCEF) IVPB 2 g/50 mL premix     2 g 100 mL/hr over 30 Minutes Intravenous Every 6 hours 07/23/14 1209 07/23/14 2235   07/23/14 0646  ceFAZolin (ANCEF) IVPB 2 g/50 mL premix     2 g 100 mL/hr over 30 Minutes Intravenous On call to O.R. 07/23/14 2563 07/23/14 0940     and started on DVT prophylaxis in the form of Xarelto.   PT and OT were ordered for total joint protocol.  Discharge planning consulted to help with postop disposition and equipment needs.  Social worker got involved for possible admission into skilled rehaqb following the hospital stay.  Patient had a decent night on the evening of surgery.  They started to get up OOB with therapy on day one. Hemovac drain was pulled without difficulty.  Continued to work with therapy into day two.  Dressing was changed on day two and the incision was healing well and looked very good. Patient was seen in rounds and was ready to go to [facility] which was approved by insurance.  Discharge to SNF - [place]  Follow up - in 2 weeks on Tuesday Oct 13th  Activity - WBAT  Disposition - [place] Upon Discharge - Good  D/C Meds - See DC Summary  DVT Prophylaxis -  Xarelto   Discharge Instructions   Call MD / Call 911    Complete by:  As directed  If you experience chest pain or shortness of breath, CALL 911 and be transported to the hospital emergency room.  If you develope a fever above 101 F, pus (white drainage) or increased drainage or redness at the wound, or calf pain, call your surgeon's office.     Change dressing    Complete by:  As directed   Change dressing daily with sterile 4 x 4 inch gauze dressing and apply TED hose. Do not submerge the incision under water.     Constipation Prevention    Complete by:  As directed   Drink plenty of fluids.  Prune juice may be helpful.  You may use a stool softener, such as Colace (over the counter) 100 mg twice a day.  Use MiraLax (over the counter) for constipation as needed.     Diet - low sodium heart healthy    Complete by:  As directed      Discharge instructions    Complete by:  As directed   Pick up stool softner and laxative for home. Do not submerge incision under water. May shower. Continue to use ice for pain and swelling from surgery.  Take Xarelto for two and a half more weeks, then discontinue Xarelto. Once the patient has completed the Xarelto, they may resume the 81 mg Aspirin.  When discharged from the skilled rehab facility, please have the facility set up the patient's [place] prior to being released.  Also provide the patient with their medications at time of release from the facility to include their pain medication, the muscle relaxants, and their blood thinner medication.  If the patient is still at the rehab facility at time of follow up appointment, please also assist the patient in arranging follow up appointment in our office and any transportation needs.     Do not put a pillow under the knee. Place it under the heel.    Complete by:  As directed      Do not sit on low chairs, stoools or toilet seats, as it may be difficult to get up from low surfaces     Complete by:  As directed      Driving restrictions    Complete by:  As directed   No driving until released by the physician.     Increase activity slowly as tolerated    Complete by:  As directed      Lifting restrictions    Complete by:  As directed   No lifting until released by the physician.     Patient may shower    Complete by:  As directed   You may shower without a dressing once there is no drainage.  Do not wash over the wound.  If drainage remains, do not shower until drainage stops.     TED hose    Complete by:  As directed   Use stockings (TED hose) for 3 weeks on both leg(s).  You may remove them at night for sleeping.     Weight bearing as tolerated    Complete by:  As directed             Medication List    STOP taking these medications       aspirin EC 81 MG tablet     JUICE PLUS FIBRE PO      TAKE these medications       acetaminophen 500 MG tablet  Commonly known as:  TYLENOL  Take 1,000 mg  by mouth 2 (two) times daily as needed for pain.     bisacodyl 10 MG suppository  Commonly known as:  DULCOLAX  Place 1 suppository (10 mg total) rectally daily as needed for moderate constipation.     cloNIDine 0.3 MG tablet  Commonly known as:  CATAPRES  Take 0.15 mg by mouth 2 (two) times daily. For HTN     colchicine 0.6 MG tablet  Take 0.6 mg by mouth 2 (two) times daily. For gout     diltiazem 240 MG 24 hr capsule  Commonly known as:  CARDIZEM CD  Take 240 mg by mouth every morning. For HTN     DSS 100 MG Caps  Take 100 mg by mouth 2 (two) times daily.     enalapril 20 MG tablet  Commonly known as:  VASOTEC  Take 20 mg by mouth 2 (two) times daily. For treatment of CHF, HTN.     EPIPEN 0.3 mg/0.3 mL Soaj injection  Generic drug:  EPINEPHrine  Inject 0.3 mg into the muscle as needed (Allergic Reaction).     hydrochlorothiazide 12.5 MG capsule  Commonly known as:  MICROZIDE  Take 12.5 mg by mouth every morning. For HTN      HYDROcodone-acetaminophen 5-325 MG per tablet  Commonly known as:  NORCO/VICODIN  Take 1-2 tablets by mouth every 4 (four) hours as needed for moderate pain.     levothyroxine 125 MCG tablet  Commonly known as:  SYNTHROID, LEVOTHROID  Take 125 mcg by mouth daily before breakfast. For thyroid therapy     methocarbamol 500 MG tablet  Commonly known as:  ROBAXIN  Take 1 tablet (500 mg total) by mouth every 6 (six) hours as needed for muscle spasms.     metoCLOPramide 5 MG tablet  Commonly known as:  REGLAN  Take 1-2 tablets (5-10 mg total) by mouth every 8 (eight) hours as needed for nausea (if ondansetron (ZOFRAN) ineffective.).     ondansetron 4 MG tablet  Commonly known as:  ZOFRAN  Take 1 tablet (4 mg total) by mouth every 6 (six) hours as needed for nausea.     OVER THE COUNTER MEDICATION  Place 1-2 drops into both eyes once as needed (contacts.). Visine as needed for dry eyes     polyethylene glycol packet  Commonly known as:  MIRALAX / GLYCOLAX  Take 17 g by mouth daily as needed for mild constipation.     Potassium Gluconate 550 MG Tabs  Take 550 mg by mouth daily.     rivaroxaban 10 MG Tabs tablet  Commonly known as:  XARELTO  - Take 1 tablet (10 mg total) by mouth daily with breakfast. Take Xarelto for two and a half more weeks, then discontinue Xarelto.  - Once the patient has completed the Xarelto, they may resume the 81 mg Aspirin.     traMADol 50 MG tablet  Commonly known as:  ULTRAM  Take 1-2 tablets (50-100 mg total) by mouth every 6 (six) hours as needed (mild pain).     zolpidem 5 MG tablet  Commonly known as:  AMBIEN  Take 5 mg by mouth at bedtime as needed for sleep.           Follow-up Information   Follow up with [name], MD. Schedule an appointment as soon as possible for a visit on 08/07/2014. (Call office at [phone] to set up appointment on Tuesday Oct 13th.)    Specialty:  Orthopedic Surgery   Contact information:  [address] [place] [address] [phone]       Signed: Arlee Muslim, PA-C Orthopaedic Surgery 07/25/2014, 9:22 AM

## 2014-07-27 ENCOUNTER — Non-Acute Institutional Stay (SKILLED_NURSING_FACILITY): Payer: Medicare Other | Admitting: Internal Medicine

## 2014-07-27 DIAGNOSIS — M1712 Unilateral primary osteoarthritis, left knee: Secondary | ICD-10-CM

## 2014-07-27 DIAGNOSIS — E039 Hypothyroidism, unspecified: Secondary | ICD-10-CM

## 2014-07-27 DIAGNOSIS — I10 Essential (primary) hypertension: Secondary | ICD-10-CM

## 2014-07-27 DIAGNOSIS — D62 Acute posthemorrhagic anemia: Secondary | ICD-10-CM

## 2014-07-27 NOTE — Progress Notes (Signed)
HISTORY & PHYSICAL  DATE: 07/27/2014   FACILITY: [facility]  LEVEL OF CARE: SNF (31)  ALLERGIES:  Allergies  Allergen Reactions  . Nsaids Other (See Comments)    significantly raises BP  . Codeine Nausea Only    CHIEF COMPLAINT:  Manage left knee osteoarthritis, hypothyroidism and hypertension  HISTORY OF PRESENT ILLNESS: Patient is [name].  KNEE OSTEOARTHRITIS: Patient had a history of pain and functional disability in the knee due to end-stage osteoarthritis and has failed nonsurgical conservative treatments. Patient had worsening of pain with activity and weight bearing, pain that interfered with activities of daily living & pain with passive range of motion. Therefore patient underwent total knee arthroplasty and tolerated the procedure well. Patient is admitted to this facility for sort short-term rehabilitation. Patient denies knee pain.  HTN: Pt 's HTN remains stable.  Denies CP, sob, DOE, pedal edema, headaches, dizziness or visual disturbances.  No complications from the medications currently being used.  Last BP : 112/67.  HYPOTHYROIDISM: The hypothyroidism remains stable. No complications noted from the medications presently being used.  The patient denies fatigue or constipation.  Last TSH not available.  PAST MEDICAL HISTORY :  Past Medical History  Diagnosis Date  . Hypertension   . Clotting disorder     DVT/PE  . Obesity   . Hypothyroidism   . Esophageal reflux   . Dyslipidemia   . Diverticulosis     minor   . Acute pulmonary embolism 06/2013  . Dysrhythmia     AFIB 2014  . Numbness     TOES  . Arthritis   . Pseudogout   . Sleep apnea     "not bad enough to use c- pap"  . Asthma     years ago     PAST SURGICAL HISTORY: Past Surgical History  Procedure Laterality Date  . Breast reconstruction    . Cesarean section    . Knee arthroscopy Left   . Colonoscopy  2004    repeat in ten years  .  Tonsillectomy    . Wisdom tooth extraction    . Tummy tuck  1987  . Total knee arthroplasty Right 01/01/2014    Procedure: TOTAL RIGHT KNEE ARTHROPLASTY;  Surgeon: [name], MD;  Location: [facility];  Service: Orthopedics;  Laterality: Right;  . Total knee arthroplasty Left 07/23/2014    Procedure: LEFT TOTAL KNEE ARTHROPLASTY;  Surgeon: [name], MD;  Location: [facility];  Service: Orthopedics;  Laterality: Left;    SOCIAL HISTORY:  reports that she quit smoking about 38 years ago. She has never used smokeless tobacco. She reports that she drinks about 2.4 ounces of alcohol per week. She reports that she does not use illicit drugs.  FAMILY HISTORY:  Family History  Problem Relation Age of Onset  . Heart disease Father   . Hypertension Father   . Coronary artery disease Father   . CVA Mother     CURRENT MEDICATIONS: Reviewed per MAR/see medication list  REVIEW OF SYSTEMS:  See HPI otherwise 14 point ROS is negative.  PHYSICAL EXAMINATION  VS:  See VS section  GENERAL: no acute distress, obese body habitus EYES: conjunctivae normal, sclerae normal, normal eye lids MOUTH/THROAT: lips without lesions,no lesions in the mouth,tongue is without lesions,uvula elevates in midline NECK: supple, trachea midline, no neck masses, no thyroid tenderness, no thyromegaly LYMPHATICS: no LAN in the neck, no supraclavicular LAN  RESPIRATORY: breathing is even & unlabored, BS CTAB CARDIAC: RRR, no murmur,no extra heart sounds, no edema GI:  ABDOMEN: abdomen soft, normal BS, no masses, no tenderness  LIVER/SPLEEN: no hepatomegaly, no splenomegaly MUSCULOSKELETAL: HEAD: normal to inspection  EXTREMITIES: LEFT UPPER EXTREMITY: full range of motion, normal strength & tone RIGHT UPPER EXTREMITY:  full range of motion, normal strength & tone LEFT LOWER EXTREMITY:  range of motion not tested due to surgery, normal strength & tone RIGHT LOWER EXTREMITY:  full range of motion, normal strength &  tone PSYCHIATRIC: the patient is alert & oriented to person, affect & behavior appropriate  LABS/RADIOLOGY:  Labs reviewed: Basic Metabolic Panel:  Recent Labs  07/18/14 1445 07/24/14 0400 07/25/14 0402  NA 135* 138 136*  K 4.2 3.6* 3.9  CL 96 99 99  CO2 26 25 27   GLUCOSE 94 159* 141*  BUN 17 12 15   CREATININE 0.81 0.76 0.74  CALCIUM 9.5 8.9 8.6   Liver Function Tests:  Recent Labs  11/28/13 1013 12/27/13 1100 07/18/14 1445  AST 20 27 30   ALT 30 38* 30  ALKPHOS 51 57 75  BILITOT 0.87 0.5 0.5  PROT 6.6 6.7 6.9  ALBUMIN 3.9 3.5 3.7   CBC:  Recent Labs  11/28/13 1013  07/18/14 1445 07/24/14 0400 07/25/14 0402  WBC 10.2  < > 5.6 13.0* 13.0*  NEUTROABS 6.2  --   --   --   --   HGB 13.1  < > 12.7 11.2* 10.0*  HCT 38.8  < > 37.0 32.5* 29.7*  MCV 97.6  < > 92.7 93.4 94.3  PLT 208  < > 191 171 152  < > = values in this interval not displayed.   ASSESSMENT/PLAN:  Left knee osteoarthritis-status post total knee arthroplasty. Continue rehabilitation. Hypothyroidism-continue levothyroxine Hypertension-well-controlled Acute blood loss anemia-check hemoglobin  Leukocytosis-recheck Hypokalemia-continue supplementation Check CBC the differential  I have reviewed patient's medical records received at admission/from hospitalization.  CPT CODE: 57017  Elizabeth Wang Y Elizabeth Wang, [place] [phone]

## 2014-08-02 ENCOUNTER — Encounter: Payer: Self-pay | Admitting: Adult Health

## 2014-08-02 ENCOUNTER — Non-Acute Institutional Stay (SKILLED_NURSING_FACILITY): Payer: Medicare Other | Admitting: Adult Health

## 2014-08-02 DIAGNOSIS — M1712 Unilateral primary osteoarthritis, left knee: Secondary | ICD-10-CM

## 2014-08-02 DIAGNOSIS — M109 Gout, unspecified: Secondary | ICD-10-CM

## 2014-08-02 DIAGNOSIS — I10 Essential (primary) hypertension: Secondary | ICD-10-CM

## 2014-08-02 DIAGNOSIS — D62 Acute posthemorrhagic anemia: Secondary | ICD-10-CM

## 2014-08-02 DIAGNOSIS — E876 Hypokalemia: Secondary | ICD-10-CM

## 2014-08-02 DIAGNOSIS — E039 Hypothyroidism, unspecified: Secondary | ICD-10-CM

## 2014-08-02 DIAGNOSIS — M10012 Idiopathic gout, left shoulder: Secondary | ICD-10-CM

## 2014-08-02 DIAGNOSIS — G4700 Insomnia, unspecified: Secondary | ICD-10-CM

## 2014-08-02 NOTE — Progress Notes (Signed)
Patient ID: Elizabeth Wang, female   DOB: 1946/02/03, 68 y.o.   MRN: [id number]              PROGRESS NOTE  DATE: 08/02/2014   FACILITY: [facility]  LEVEL OF CARE: SNF (31)  Acute Visit  Discharge Notes  HISTORY OF PRESENT ILLNESS:   This is a 68 year old female who is for discharge home with outpatient PT. She has been admitted to [facility] on 07/25/14 from [facility] with Osteoarthritis S/P left total knee arthroplasty. Patient was admitted to this facility for short-term rehabilitation after the patient's recent hospitalization.  Patient has completed SNF rehabilitation and therapy has cleared the patient for discharge.  Reassessment of ongoing problem(s):  HTN: Pt 's HTN remains stable.  Denies CP, sob, DOE, pedal edema, headaches, dizziness or visual disturbances.  No complications from the medications currently being used.  Last BP : 128/65  HYPOTHYROIDISM: The hypothyroidism remains stable. No complications noted from the medications presently being used.  The patient denies fatigue or constipation. 9/14 TSH 1.482  ANEMIA: The anemia has been stable. The patient denies fatigue, melena or hematochezia. No complications from the medications currently being used. 10/15 hgb 10.6   Past Medical History  Diagnosis Date  . Hypertension   . Clotting disorder     DVT/PE  . Obesity   . Hypothyroidism   . Esophageal reflux   . Dyslipidemia   . Diverticulosis     minor   . Acute pulmonary embolism 06/2013  . Dysrhythmia     AFIB 2014  . Numbness     TOES  . Arthritis   . Pseudogout   . Sleep apnea     "not bad enough to use c- pap"  . Asthma     years ago      Reviewed.  No changes/see problem list  CURRENT MEDICATIONS: Reviewed per MAR/see medication list    Allergies  Allergen Reactions  . Nsaids Other (See Comments)    significantly raises BP  . Codeine Nausea Only     REVIEW OF SYSTEMS:  GENERAL: no change in appetite, no fatigue,  no weight changes, no fever, chills or weakness RESPIRATORY: no cough, SOB, DOE, wheezing, hemoptysis CARDIAC: no chest pain, edema or palpitations GI: no abdominal pain, diarrhea, constipation, heart burn, nausea or vomiting  PHYSICAL EXAMINATION  GENERAL: no acute distress, normal body habitus EYES: conjunctivae normal, sclerae normal, normal eye lids NECK: supple, trachea midline, no neck masses, no thyroid tenderness, no thyromegaly LYMPHATICS: no LAN in the neck, no supraclavicular LAN RESPIRATORY: breathing is even & unlabored, BS CTAB CARDIAC: RRR, no murmur,no extra heart sounds, no edema GI: abdomen soft, normal BS, no masses, no tenderness, no hepatomegaly, no splenomegaly EXTREMITIES: able to move all 4 extremities; ambulates with walker PSYCHIATRIC: the patient is alert & oriented to person, affect & behavior appropriate  LABS/RADIOLOGY: 07/27/14  WBC 6.2 hemoglobin 10.6 hematocrit 33.4 MCV 99.1 Labs reviewed: Basic Metabolic Panel:  Recent Labs  07/18/14 1445 07/24/14 0400 07/25/14 0402  NA 135* 138 136*  K 4.2 3.6* 3.9  CL 96 99 99  CO2 26 25 27   GLUCOSE 94 159* 141*  BUN 17 12 15   CREATININE 0.81 0.76 0.74  CALCIUM 9.5 8.9 8.6   Liver Function Tests:  Recent Labs  11/28/13 1013 12/27/13 1100 07/18/14 1445  AST 20 27 30   ALT 30 38* 30  ALKPHOS 51 57 75  BILITOT 0.87 0.5 0.5  PROT  6.6 6.7 6.9  ALBUMIN 3.9 3.5 3.7   CBC:  Recent Labs  11/28/13 1013  07/18/14 1445 07/24/14 0400 07/25/14 0402  WBC 10.2  < > 5.6 13.0* 13.0*  NEUTROABS 6.2  --   --   --   --   HGB 13.1  < > 12.7 11.2* 10.0*  HCT 38.8  < > 37.0 32.5* 29.7*  MCV 97.6  < > 92.7 93.4 94.3  PLT 208  < > 191 171 152  < > = values in this interval not displayed.   ASSESSMENT/PLAN:  Osteoarthritis status post left total knee arthroplasty - for outpatient PT Hypertension - well controlled; continue diltiazem, HCTZ and Catapres Hypothyroidism - continue Synthroid Gout - stable;  continue Colcyrs Insomnia - continue on twice a day when necessary Anemia, acute blood loss - stable Hypokalemia - continue supplementation   I have filled out patient's discharge paperwork and written prescriptions.  Patient will have outpatient PT.  Total discharge time: Less than 30 minutes  Discharge time involved coordination of the discharge process with Education officer, museum, nursing staff and therapy department.    CPT CODE: 69794  MEDINA-VARGAS,Elizabeth Wang, [facility] [phone]

## 2014-08-24 ENCOUNTER — Other Ambulatory Visit: Payer: Self-pay | Admitting: Internal Medicine

## 2014-08-24 DIAGNOSIS — R10811 Right upper quadrant abdominal tenderness: Secondary | ICD-10-CM

## 2014-08-27 ENCOUNTER — Other Ambulatory Visit: Payer: Medicare Other

## 2014-08-29 ENCOUNTER — Other Ambulatory Visit: Payer: Medicare Other

## 2014-09-03 ENCOUNTER — Other Ambulatory Visit: Payer: Self-pay | Admitting: Cardiology

## 2014-09-04 ENCOUNTER — Other Ambulatory Visit: Payer: Medicare Other

## 2014-09-04 NOTE — Telephone Encounter (Signed)
Please call patient and verify dose.

## 2014-09-06 NOTE — Telephone Encounter (Signed)
See Dr. Turner's response 

## 2014-09-06 NOTE — Telephone Encounter (Signed)
See Dr. Theodosia Blender note that's attached

## 2014-09-07 ENCOUNTER — Ambulatory Visit (HOSPITAL_COMMUNITY)
Admission: RE | Admit: 2014-09-07 | Discharge: 2014-09-07 | Disposition: A | Discharge status: Home / Self Care | Payer: Medicare Other | Source: Ambulatory Visit | Attending: Internal Medicine | Admitting: Internal Medicine

## 2014-09-07 ENCOUNTER — Other Ambulatory Visit (HOSPITAL_COMMUNITY): Payer: Self-pay | Admitting: Orthopedic Surgery

## 2014-09-07 ENCOUNTER — Telehealth (HOSPITAL_COMMUNITY): Payer: Self-pay | Admitting: *Deleted

## 2014-09-07 DIAGNOSIS — R600 Localized edema: Secondary | ICD-10-CM | POA: Insufficient documentation

## 2014-09-07 NOTE — Progress Notes (Signed)
Left Lower Ext. Venous Duplex Completed. Negative for DVT or SVT. Dagmar Adcox, BS, RDMS, RVT  

## 2014-10-12 ENCOUNTER — Ambulatory Visit
Admission: RE | Admit: 2014-10-12 | Discharge: 2014-10-12 | Disposition: A | Discharge status: Home / Self Care | Payer: Medicare Other | Source: Ambulatory Visit | Attending: Physical Medicine and Rehabilitation | Admitting: Physical Medicine and Rehabilitation

## 2014-10-12 ENCOUNTER — Other Ambulatory Visit: Payer: Self-pay | Admitting: Physical Medicine and Rehabilitation

## 2014-10-12 DIAGNOSIS — M25552 Pain in left hip: Secondary | ICD-10-CM

## 2014-10-12 DIAGNOSIS — M5442 Lumbago with sciatica, left side: Secondary | ICD-10-CM

## 2014-11-23 ENCOUNTER — Ambulatory Visit
Admission: RE | Admit: 2014-11-23 | Discharge: 2014-11-23 | Disposition: A | Discharge status: Home / Self Care | Payer: Medicare Other | Source: Ambulatory Visit | Attending: Internal Medicine | Admitting: Internal Medicine

## 2014-11-23 DIAGNOSIS — R10811 Right upper quadrant abdominal tenderness: Secondary | ICD-10-CM

## 2015-05-18 IMAGING — CR DG CHEST 2V
2 series · 2 of 2 positions shown · non-contrast
Comparison: 07/14/2013

CLINICAL DATA: Preop for right knee replacement

EXAM:
CHEST  2 VIEW

[w chest pa]
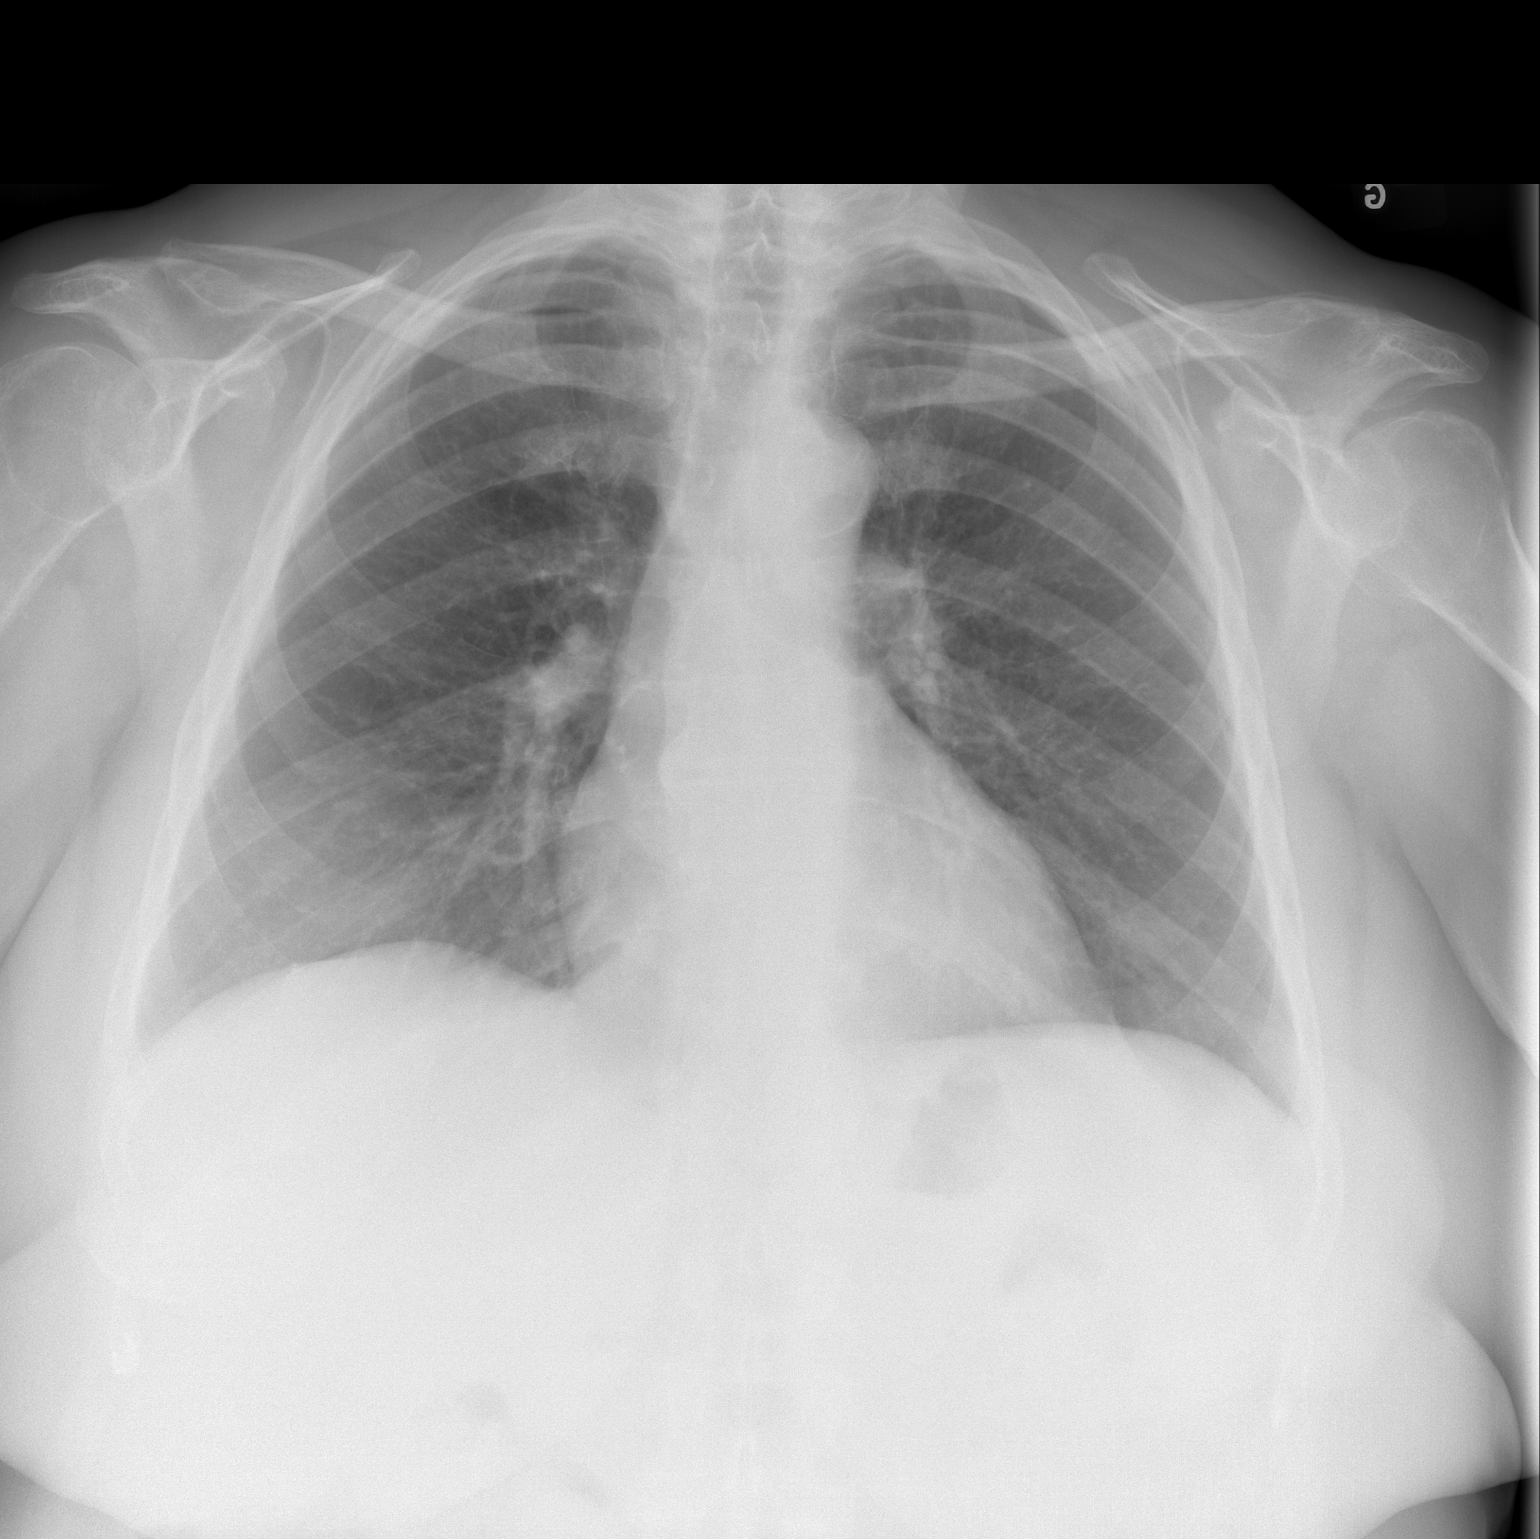

[w chest lat]
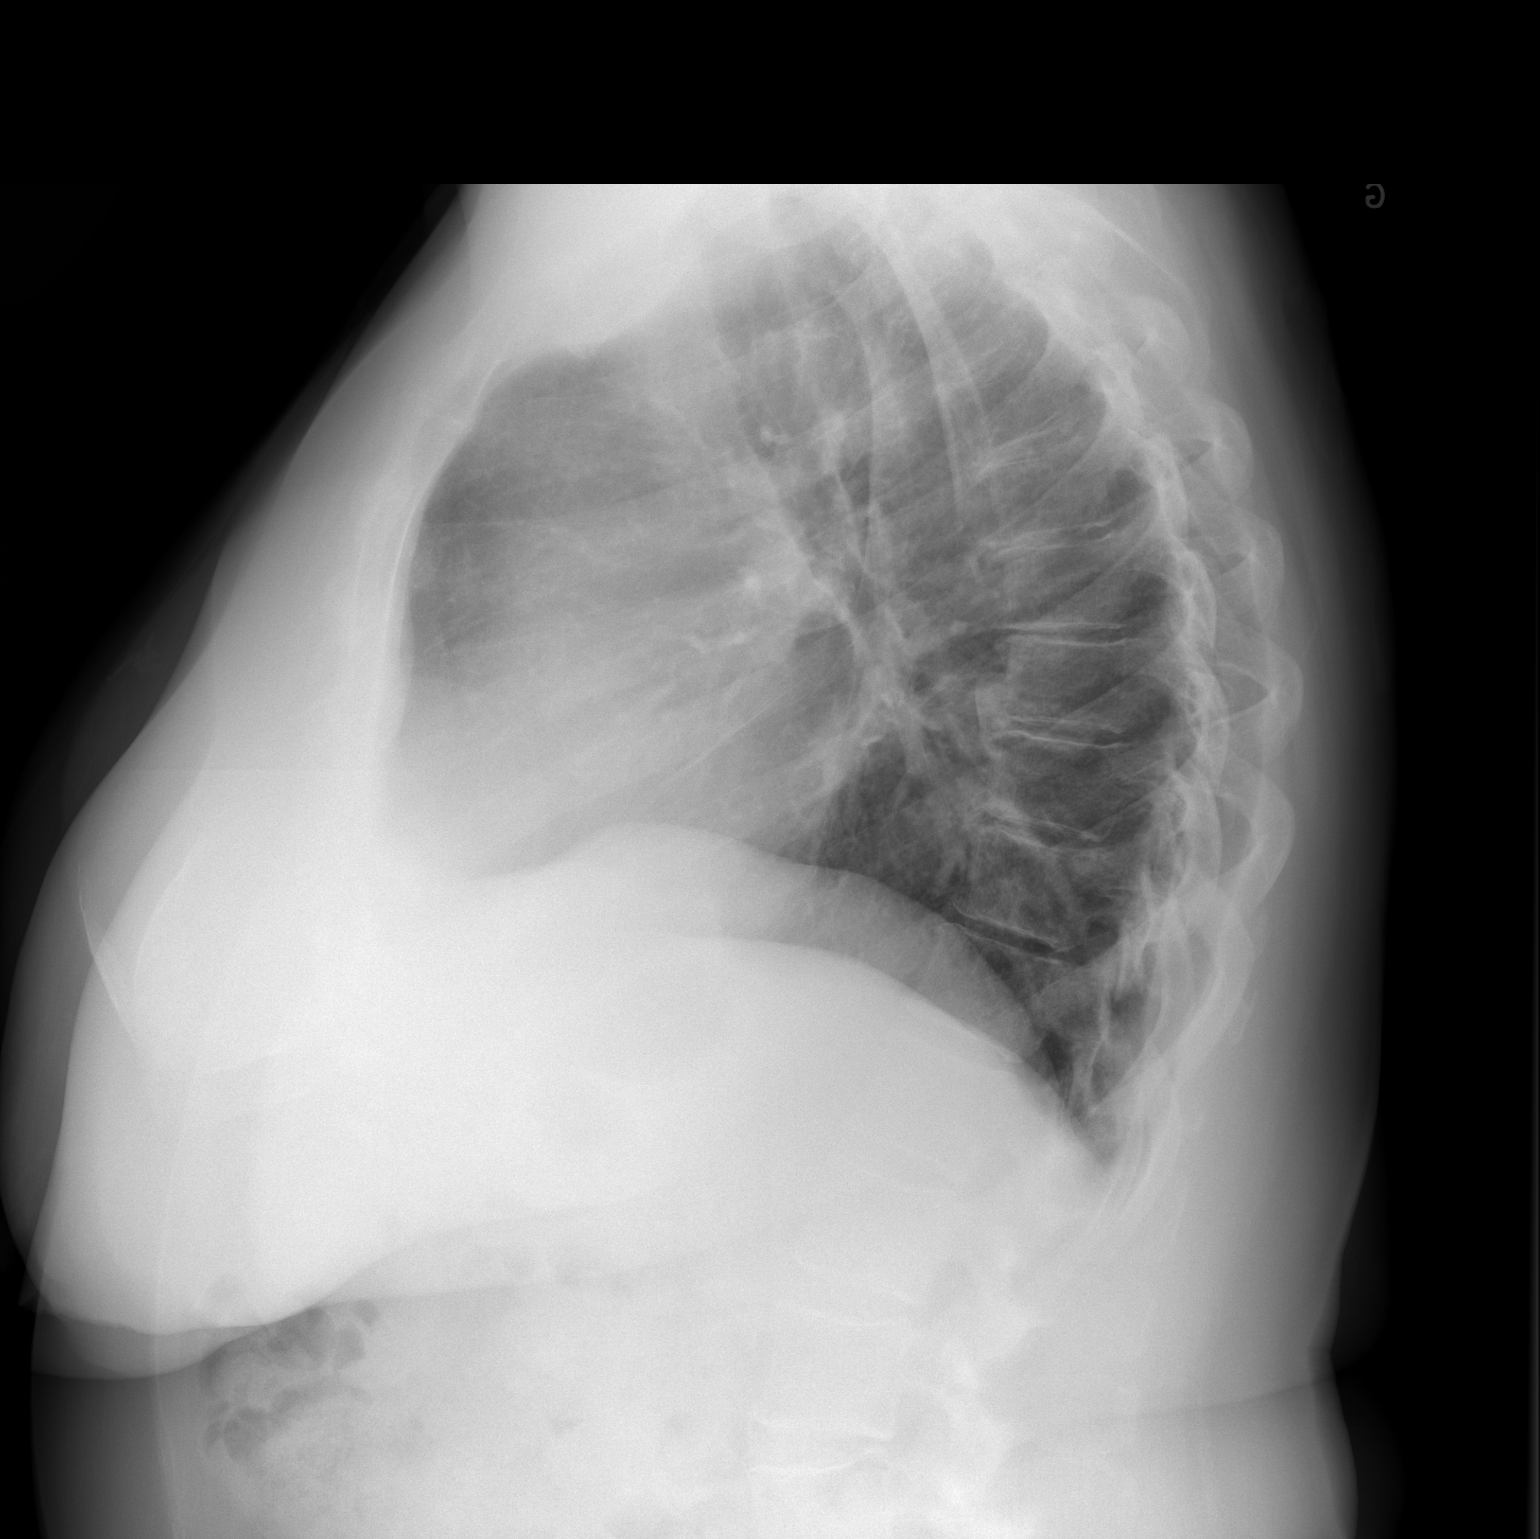

[2 of 2 positions shown; findings below may reference images not displayed]

FINDINGS: Cardiomediastinal silhouette is unremarkable. No acute infiltrate or
pleural effusion. No pulmonary edema. Mild degenerative changes mid
thoracic spine.
IMPRESSION: No active cardiopulmonary disease.

## 2015-09-13 ENCOUNTER — Other Ambulatory Visit: Payer: Self-pay | Admitting: Cardiology

## 2015-10-10 ENCOUNTER — Other Ambulatory Visit: Payer: Self-pay | Admitting: Cardiology

## 2015-10-23 ENCOUNTER — Telehealth: Payer: Self-pay | Admitting: Cardiology

## 2015-10-23 NOTE — Telephone Encounter (Signed)
New message  Pt called request a call back to determine if she will need to continue to take the diltiazem (CARDIZEM CD) 240 MG 24 hr capsule. Inorder to get a refill she needed an appt. However the pt is not sure if the medication is still needed.   Please call.

## 2015-10-23 NOTE — Telephone Encounter (Signed)
Explained to patient she should not stop medications without being instructed by her doctor. Scheduled patient for overdue 6 mo OV with Rosaria Ferries 1/4 for medication evaluation. Patient confirms she has enough medication to make it to this appointment.

## 2015-10-30 ENCOUNTER — Encounter: Payer: Self-pay | Admitting: Physician Assistant

## 2015-10-30 ENCOUNTER — Ambulatory Visit (INDEPENDENT_AMBULATORY_CARE_PROVIDER_SITE_OTHER): Payer: Medicare Other | Admitting: Physician Assistant

## 2015-10-30 VITALS — BP 128/88 | HR 82 | Ht 64.0 in | Wt 214.0 lb

## 2015-10-30 DIAGNOSIS — I4891 Unspecified atrial fibrillation: Secondary | ICD-10-CM

## 2015-10-30 DIAGNOSIS — I480 Paroxysmal atrial fibrillation: Secondary | ICD-10-CM

## 2015-10-30 DIAGNOSIS — I10 Essential (primary) hypertension: Secondary | ICD-10-CM | POA: Diagnosis not present

## 2015-10-30 MED ORDER — DILTIAZEM HCL ER COATED BEADS 240 MG PO CP24: 240.0000 mg | ORAL_CAPSULE | Freq: Every day | ORAL | Status: DC

## 2015-10-30 MED ORDER — ASPIRIN EC 81 MG PO TBEC: 162.0000 mg | DELAYED_RELEASE_TABLET | Freq: Every day | ORAL | Status: DC

## 2015-10-30 NOTE — Patient Instructions (Signed)
Medication Instructions:  Your physician recommends that you continue on your current medications as directed. Please refer to the Current Medication list given to you today.  Your Diltiazem has been refilled today  Labwork: None ordered  Testing/Procedures: None ordered  Follow-Up: Your physician wants you to follow-up in: 1 year with Dr.Rakers You will receive a reminder letter in the mail two months in advance. If you don't receive a letter, please call our office to schedule the follow-up appointment.   Any Other Special Instructions Will Be Listed Below (If Applicable).     If you need a refill on your cardiac medications before your next appointment, please call your pharmacy.

## 2015-10-30 NOTE — Progress Notes (Signed)
Cardiology Office Note   Date:  10/30/2015   ID:  Elizabeth Wang, DOB 1946-03-06, MRN [id number]  PCP:  Lottie Dawson, MD  Cardiologist:  Dr Julaine Fusi, PA-C   Chief Complaint  Patient presents with  . Appointment    swelling in legs at the end of the day.    History of Present Illness: Elizabeth Wang is a 70 y.o. female with a history of HTN, hypothyroid, anemia (after surgery), DVT/PE, PAF on Xarelto and Dilt.   Elizabeth Wang presents for yearly follow-up.  Elizabeth Wang has been doing well for the past year. She has not had any palpitations or the dyspnea that was her major symptom when she had the atrial fibrillation. This was in the setting of acute physical stress and she has not had any situations of that type.  She exercises regularly and has no problems with that. She has some mild daytime edema, but does not wake up with any lower extremity edema. She has no new dyspnea on exertion, no orthopnea or PND. She never gets palpitations or chest pain.  She saw a hematologist at [facility] and it was decided to take her off the Xarelto at that time. She has not taken it since and has no problems. The hematologist told her to take 2 baby aspirins every morning and she is compliant with this.  She wonders why she is taking 3 different blood pressure medications. She thinks she was told she had a murmur several years ago.   Past Medical History  Diagnosis Date  . Hypertension   . Clotting disorder (HCC)     DVT/PE  . Obesity   . Hypothyroidism   . Esophageal reflux   . Dyslipidemia   . Diverticulosis     minor   . Acute pulmonary embolism ([place]) 06/2013  . Dysrhythmia     AFIB 2014  . Numbness     TOES  . Arthritis   . Pseudogout   . Sleep apnea     "not bad enough to use c- pap"  . Asthma     years ago     Past Surgical History  Procedure Laterality Date  . Breast reconstruction    . Cesarean section    . Knee arthroscopy Left   . Colonoscopy   2004    repeat in ten years  . Tonsillectomy    . Wisdom tooth extraction    . Tummy tuck  1987  . Total knee arthroplasty Right 01/01/2014    Procedure: TOTAL RIGHT KNEE ARTHROPLASTY;  Surgeon: [name], MD;  Location: [facility];  Service: Orthopedics;  Laterality: Right;  . Total knee arthroplasty Left 07/23/2014    Procedure: LEFT TOTAL KNEE ARTHROPLASTY;  Surgeon: [name], MD;  Location: [facility];  Service: Orthopedics;  Laterality: Left;    Current Outpatient Prescriptions  Medication Sig Dispense Refill  . acetaminophen (TYLENOL) 500 MG tablet Take 1,000 mg by mouth 2 (two) times daily as needed for pain.     . cloNIDine (CATAPRES) 0.3 MG tablet Take 0.15 mg by mouth 2 (two) times daily. For HTN    . colchicine 0.6 MG tablet Take 0.6 mg by mouth 2 (two) times daily. For gout    . diltiazem (CARDIZEM CD) 240 MG 24 hr capsule Take 1 capsule (240 mg total) by mouth daily. 30 capsule 11  . enalapril (VASOTEC) 20 MG tablet Take 20 mg by mouth 2 (two) times daily. For  treatment of CHF, HTN.    [name] EPINEPHrine (EPIPEN) 0.3 mg/0.3 mL SOAJ injection Inject 0.3 mg into the muscle as needed (Allergic Reaction).     [name] levothyroxine (SYNTHROID, LEVOTHROID) 125 MCG tablet Take 125 mcg by mouth daily before breakfast. For thyroid therapy    . OVER THE COUNTER MEDICATION Place 1-2 drops into both eyes once as needed (contacts.). Visine as needed for dry eyes    . Potassium Gluconate 550 MG TABS Take 550 mg by mouth daily.     [name] zolpidem (AMBIEN) 5 MG tablet Take 5 mg by mouth at bedtime as needed for sleep.    [name] aspirin EC 81 MG tablet Take 2 tablets (162 mg total) by mouth daily.     No current facility-administered medications for this visit.    Allergies:   Nsaids and Codeine    Social History:  The patient  reports that she quit smoking about 39 years ago. She has never used smokeless tobacco. She reports that she drinks about 2.4 oz of alcohol per week. She reports that she does not  use illicit drugs.   Family History:  The patient's family history includes CVA in her mother; Coronary artery disease in her father; Heart disease in her father; Hypertension in her father.    ROS:  Please see the history of present illness. All other systems are reviewed and negative.    PHYSICAL EXAM: VS:  BP 128/88 mmHg  Pulse 82  Ht 5\' 4"  (1.626 m)  Wt 214 lb (97.07 kg)  BMI 36.72 kg/m2 , BMI Body mass index is 36.72 kg/(m^2). GEN: Well nourished, well developed, female in no acute distress HEENT: normal for age  Neck: no JVD, no carotid bruit, murmur radiates to the left carotid, no masses Cardiac: RRR; 2/6 murmur, no rubs, or gallops Respiratory:  clear to auscultation bilaterally, normal work of breathing GI: soft, nontender, nondistended, + BS MS: Mild chronic left ankle deformity, no atrophy; no edema; distal pulses are 2+ in all 4 extremities  Skin: warm and dry, no rash Neuro:  Strength and sensation are intact Psych: euthymic mood, full affect   EKG:  EKG is ordered today. The ekg ordered today demonstrates sinus rhythm, rate 82, possible LVH   Recent Labs: No results found for requested labs within last 365 days.    Lipid Panel No results found for: CHOL, TRIG, HDL, CHOLHDL, VLDL, LDLCALC, LDLDIRECT   Wt Readings from Last 3 Encounters:  10/30/15 214 lb (97.07 kg)  08/02/14 212 lb 6.4 oz (96.344 kg)  07/27/14 212 lb 6.4 oz (96.344 kg)     Other studies Reviewed: Additional studies/ records that were reviewed today include: Previous office notes and other records.  ASSESSMENT AND PLAN:  1.  Paroxysmal atrial fibrillation: The patient had a single episode in the setting of physical stress. She has not had any physical stress of that level since then and no symptoms of the atrial fibrillation. She is to continue on the diltiazem.  2. Anticoagulation: Elizabeth Wang saw a hematologist at [facility]. The hematologist felt that her risk of CVA was low and she  could come off the Xarelto, going on 162 mg of aspirin daily. Her CHADS2VASC=3, but the patient feels that since the event was in the setting of physical stress much higher than normal for her, this is an appropriate course of action.  She was advised that if she has any additional episodes of atrial fibrillation or any symptoms reminiscent of her  atrial fibrillation, she needs to contact us. At that point, we will encourage her to resume anticoagulation.  3. Hypertension: The patient wonders if she needs to be on 3 different medications. Her blood pressure is currently well controlled so I do not recommend changes at this time.   If her blood pressure lowest to the point that medication can be discontinued or reduced, I would recommend the clonidine first.   Current medicines are reviewed at length with the patient today.  The patient has concerns regarding medicines. All concerns were addressed  The following changes have been made:  no change  Labs/ tests ordered today include:   Orders Placed This Encounter  Procedures  . EKG 12-Lead     Disposition:   FU with Dr. Radford Pax in one year  Signed, Lenoard Aden  10/30/2015 9:[address], [place],   [id number] Phone: [phone]; Fax: [phone]

## 2016-06-11 ENCOUNTER — Ambulatory Visit (INDEPENDENT_AMBULATORY_CARE_PROVIDER_SITE_OTHER): Payer: Medicare Other | Admitting: Podiatry

## 2016-06-11 VITALS — BP 147/79 | HR 70 | Resp 14

## 2016-06-11 DIAGNOSIS — L84 Corns and callosities: Secondary | ICD-10-CM | POA: Diagnosis not present

## 2016-06-11 DIAGNOSIS — M19072 Primary osteoarthritis, left ankle and foot: Secondary | ICD-10-CM

## 2016-06-11 DIAGNOSIS — M129 Arthropathy, unspecified: Secondary | ICD-10-CM

## 2016-06-11 NOTE — Progress Notes (Signed)
   Subjective:    Patient ID: Elizabeth Wang, female    DOB: 10-20-1946, 70 y.o.   MRN: [id number]  HPI this patient presents the office with chief complaint of a painful callus on the tip of the big toe left foot. He states that this painful callus has been present for months. She says it becomes painful when she is very active and walking. She says that she has knee replacements due to arthritis and ankle problem left foot, which limits motion left foot. She says she was seen by a rheumatologist who was treating her for pseudogout with colchryss.  She presents the office today for an evaluation of her left big toe    Review of Systems  All other systems reviewed and are negative.      Objective:   Physical Exam GENERAL APPEARANCE: Alert, conversant. Appropriately groomed. No acute distress.  VASCULAR: Pedal pulses are  palpable at  [facility] and PT bilateral.  Capillary refill time is immediate to all digits,  Normal temperature gradient.  Digital hair growth is present bilateral  NEUROLOGIC: sensation is normal to 5.07 monofilament at 5/5 sites bilateral.  Light touch is intact bilateral, Muscle strength normal.  MUSCULOSKELETAL: acceptable muscle strength, tone and stability bilateral.  Intrinsic muscluature intact bilateral.  IPJ  DJD left hallux.  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 Fixed plantarflexory position due to arthritis.  DERMATOLOGIC: skin color, texture, and turgor are within normal limits.  No preulcerative lesions or ulcers  are seen, no interdigital maceration noted.  No open lesions present.  Digital nails are asymptomatic. No drainage noted. Callus noted distally.Transverse laceration noted medially at proximal nail fold.         Assessment & Plan:  Arthritis  left foot.  Callus left hallux.   IE  Discussed condition with patient.  She says she was then directed to walk differently trying to extend her toes and this is helped over the last week. Debridement of the callus was performed today  with the Dremel  Tool.  Discussed possible surgical correction of the toe position if the problem persists or worsens. Neosporin and dry sterile dressing was applied to a laceration on the left hallux. No evidence of any redness, swelling or infection. Return to the office when necessary   Gardiner Barefoot DPM

## 2016-10-28 ENCOUNTER — Other Ambulatory Visit: Payer: Self-pay | Admitting: *Deleted

## 2016-10-28 MED ORDER — DILTIAZEM HCL ER COATED BEADS 240 MG PO CP24: 240.0000 mg | ORAL_CAPSULE | Freq: Every day | ORAL | 0 refills | Status: DC

## 2016-11-13 ENCOUNTER — Ambulatory Visit: Payer: Medicare Other | Admitting: Cardiology

## 2016-11-26 ENCOUNTER — Other Ambulatory Visit: Payer: Self-pay | Admitting: Internal Medicine

## 2016-11-26 DIAGNOSIS — L97529 Non-pressure chronic ulcer of other part of left foot with unspecified severity: Secondary | ICD-10-CM

## 2016-11-27 ENCOUNTER — Ambulatory Visit
Admission: RE | Admit: 2016-11-27 | Discharge: 2016-11-27 | Disposition: A | Discharge status: Home / Self Care | Payer: Medicare Other | Source: Ambulatory Visit | Attending: Internal Medicine | Admitting: Internal Medicine

## 2016-11-27 DIAGNOSIS — L97529 Non-pressure chronic ulcer of other part of left foot with unspecified severity: Secondary | ICD-10-CM

## 2016-11-28 ENCOUNTER — Other Ambulatory Visit: Payer: Medicare Other

## 2016-12-04 ENCOUNTER — Other Ambulatory Visit: Payer: Self-pay | Admitting: *Deleted

## 2016-12-04 MED ORDER — DILTIAZEM HCL ER COATED BEADS 240 MG PO CP24: 240.0000 mg | ORAL_CAPSULE | Freq: Every day | ORAL | 0 refills | Status: DC

## 2016-12-17 ENCOUNTER — Encounter: Payer: Self-pay | Admitting: Cardiology

## 2016-12-17 DIAGNOSIS — M79675 Pain in left toe(s): Secondary | ICD-10-CM | POA: Insufficient documentation

## 2016-12-17 DIAGNOSIS — L97521 Non-pressure chronic ulcer of other part of left foot limited to breakdown of skin: Secondary | ICD-10-CM | POA: Insufficient documentation

## 2016-12-17 NOTE — Progress Notes (Signed)
Cardiology Office Note    Date:  12/18/2016   ID:  Elizabeth Wang, DOB 16-Jan-1946, MRN [id number]  PCP:  [name], MD  Cardiologist:  Fransico Him, MD   Chief Complaint  Patient presents with  . Atrial Fibrillation  . Hypertension    History of Present Illness:  Elizabeth Wang is a 71 y.o. female with a history of Paroxysmal atrial fibrillation (1 episode in the setting of acute stress) and HTN who presents today for followup.  She denies any chest pain or pressure, SOB, DOE (except going up steps), LE edema, dizziness, palpitations or syncope. She is exercising for a while but is currently on hold due to sciatica.   Past Medical History:  Diagnosis Date  . Acute pulmonary embolism ([place]) 06/2013  . Arthritis   . Asthma    years ago   . Clotting disorder (HCC)    DVT/PE  . Diverticulosis    minor   . Dyslipidemia   . Esophageal reflux   . Hypertension   . Hypothyroidism   . Numbness    TOES  . Obesity   . Paroxysmal atrial fibrillation (HCC)    1 isolated episode in the setting of acute stress with no further episodes.   . Pseudogout   . Sleep apnea    "not bad enough to use c- pap"    Past Surgical History:  Procedure Laterality Date  . BREAST RECONSTRUCTION    . CESAREAN SECTION    . COLONOSCOPY  2004   repeat in ten years  . KNEE ARTHROSCOPY Left   . TONSILLECTOMY    . 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 TOTAL KNEE ARTHROPLASTY Left 07/23/2014   Procedure: LEFT TOTAL KNEE ARTHROPLASTY;  Surgeon: [name], MD;  Location: [facility];  Service: Orthopedics;  Laterality: Left;  . tummy tuck  1987  . WISDOM TOOTH EXTRACTION      Current Medications: Current Meds  Medication Sig  . acetaminophen (TYLENOL) 500 MG tablet Take 1,000 mg by mouth 2 (two) times daily as needed for pain.   [name] aspirin EC 81 MG tablet Take 2  tablets (162 mg total) by mouth daily.  . cloNIDine (CATAPRES) 0.3 MG tablet Take 0.15 mg by mouth 2 (two) times daily. For HTN  . colchicine 0.6 MG tablet Take 0.6 mg by mouth 2 (two) times daily. For gout  . diclofenac (VOLTAREN) 75 MG EC tablet Take 75 mg by mouth 2 (two) times daily.  [name] diltiazem (CARDIZEM CD) 240 MG 24 hr capsule Take 1 capsule (240 mg total) by mouth daily.  [name] EPINEPHrine (EPIPEN) 0.3 mg/0.3 mL SOAJ injection Inject 0.3 mg into the muscle as needed (Allergic Reaction).   . gabapentin (NEURONTIN) 300 MG capsule Take 300 mg by mouth at bedtime.  [name] levothyroxine (SYNTHROID, LEVOTHROID) 125 MCG tablet Take 125 mcg by mouth daily before breakfast. For thyroid therapy  . Potassium Gluconate 550 MG TABS Take 550 mg by mouth daily.   . Sulfamethoxazole-Trimethoprim (BACTRIM PO) Take 160 mg by mouth 2 (two) times daily.  . valsartan (DIOVAN) 160 MG tablet Take 160 mg by mouth 2 (two) times daily.   [name] zolpidem (AMBIEN) 5 MG tablet Take 5 mg by mouth at bedtime as needed for sleep.  . [DISCONTINUED] OVER THE COUNTER MEDICATION Place 1-2 drops into both eyes once as needed (  contacts.). Visine as needed for dry eyes    Allergies:   Nsaids and Codeine   Social History   Social History  . Marital status: Married    Spouse name: N/A  . Number of children: N/A  . Years of education: N/A   Occupational History  . Financial Advisor    Social History Main Topics  . Smoking status: Former Smoker    Packs/day: 0.25    Years: 12.00    Quit date: 12/28/1975  . Smokeless tobacco: Never Used  . Alcohol use 2.4 oz/week    4 Glasses of wine per week     Comment: socially  . Drug use: No  . Sexual activity: Not Asked   Other Topics Concern  . None   Social History Narrative   Lives in [place] with her husband. Exercises at the gym 4 times a week.     Family History:  The patient's family history includes CVA in her mother; Coronary artery disease in her father; Heart disease  in her father; Hypertension in her father.   ROS:   Please see the history of present illness.    ROS All other systems reviewed and are negative.  No flowsheet data found.     PHYSICAL EXAM:   VS:  BP 100/80   Pulse 70   Ht 5' 3.5" (1.613 m)   Wt 219 lb 12.8 oz (99.7 kg)   BMI 38.33 kg/m    GEN: Well nourished, well developed, in no acute distress  HEENT: normal  Neck: no JVD, carotid bruits, or masses Cardiac: RRR; no murmurs, rubs, or gallops,no edema.  Intact distal pulses bilaterally.  Respiratory:  clear to auscultation bilaterally, normal work of breathing GI: soft, nontender, nondistended, + BS MS: no deformity or atrophy  Skin: warm and dry, no rash Neuro:  Alert and Oriented x 3, Strength and sensation are intact Psych: euthymic mood, full affect  Wt Readings from Last 3 Encounters:  12/18/16 219 lb 12.8 oz (99.7 kg)  10/30/15 214 lb (97.1 kg)  08/02/14 212 lb 6.4 oz (96.3 kg)      Studies/Labs Reviewed:   EKG:  EKG is ordered today.  The ekg ordered today demonstrates NSR with PACs  Recent Labs: No results found for requested labs within last 8760 hours.   Lipid Panel No results found for: CHOL, TRIG, HDL, CHOLHDL, VLDL, LDLCALC, LDLDIRECT  Additional studies/ records that were reviewed today include:  none    ASSESSMENT:    1. Paroxysmal atrial fibrillation (HCC)   2. Essential hypertension, benign      PLAN:  In order of problems listed above:  1.  Paroxysmal atrial fibrillation: The patient had a single episode in the setting of physical stress. She has not had any physical stress of that level since then and no symptoms of the atrial fibrillation. She is to continue on the diltiazem.  She was seen by a hematologist at [facility] who felt that her risk of CVA was low and she could come off the Xarelto, going on 162 mg of aspirin daily. Her CHADS2VASC=3, but the patient feels that since the event was in the setting of physical stress much  higher than normal for her, this is an appropriate course of action. She was advised that if she has any additional episodes of atrial fibrillation or any symptoms reminiscent of her atrial fibrillation, she needs to contact us.She was symptomatic with her episodes of afib so would know if it occurred.  At that point, we will encourage anticoagulation.  3. Hypertension: BP controlled on current meds. She will continue on Clonidine, Cardizem and ARB  Medication Adjustments/Labs and Tests Ordered: Current medicines are reviewed at length with the patient today.  Concerns regarding medicines are outlined above.  Medication changes, Labs and Tests ordered today are listed in the Patient Instructions below.  There are no Patient Instructions on file for this visit.   Signed, Fransico Him, MD  12/18/2016 8:33 AM    [address], [place], [place]  [address] Phone: [phone]; Fax: [phone]

## 2016-12-18 ENCOUNTER — Ambulatory Visit (INDEPENDENT_AMBULATORY_CARE_PROVIDER_SITE_OTHER): Payer: Medicare Other | Admitting: Cardiology

## 2016-12-18 ENCOUNTER — Encounter: Payer: Self-pay | Admitting: Cardiology

## 2016-12-18 ENCOUNTER — Encounter (INDEPENDENT_AMBULATORY_CARE_PROVIDER_SITE_OTHER): Payer: Self-pay

## 2016-12-18 VITALS — BP 100/80 | HR 70 | Ht 63.5 in | Wt 219.8 lb

## 2016-12-18 DIAGNOSIS — I480 Paroxysmal atrial fibrillation: Secondary | ICD-10-CM | POA: Diagnosis not present

## 2016-12-18 DIAGNOSIS — I10 Essential (primary) hypertension: Secondary | ICD-10-CM

## 2016-12-18 MED ORDER — DILTIAZEM HCL ER COATED BEADS 240 MG PO CP24: 240.0000 mg | ORAL_CAPSULE | Freq: Every day | ORAL | 3 refills | Status: DC

## 2016-12-18 NOTE — Patient Instructions (Signed)

## 2017-01-21 DIAGNOSIS — M8660 Other chronic osteomyelitis, unspecified site: Secondary | ICD-10-CM | POA: Diagnosis not present

## 2017-02-09 ENCOUNTER — Ambulatory Visit (INDEPENDENT_AMBULATORY_CARE_PROVIDER_SITE_OTHER): Payer: Medicare Other | Admitting: Podiatry

## 2017-02-09 ENCOUNTER — Encounter: Payer: Self-pay | Admitting: Podiatry

## 2017-02-09 ENCOUNTER — Ambulatory Visit (INDEPENDENT_AMBULATORY_CARE_PROVIDER_SITE_OTHER): Payer: Medicare Other

## 2017-02-09 DIAGNOSIS — M205X2 Other deformities of toe(s) (acquired), left foot: Secondary | ICD-10-CM | POA: Diagnosis not present

## 2017-02-09 DIAGNOSIS — L97521 Non-pressure chronic ulcer of other part of left foot limited to breakdown of skin: Secondary | ICD-10-CM | POA: Diagnosis not present

## 2017-02-12 LAB — WOUND CULTURE
Gram Stain: NONE SEEN
Gram Stain: NONE SEEN

## 2017-02-15 ENCOUNTER — Telehealth: Payer: Self-pay | Admitting: *Deleted

## 2017-02-15 MED ORDER — LEVOFLOXACIN 500 MG PO TABS: 500.0000 mg | ORAL_TABLET | Freq: Every day | ORAL | 0 refills | Status: DC

## 2017-02-15 NOTE — Progress Notes (Signed)
Presents with painful red hallux left.  Pulses are palp and CFT is WNL. Superficial ulceration tip of mallet toe not probing to bone with small amount of purulence.  No odor and no cellulitis of toe/foot. Radiographs do not demonstrate osteo at this time.  Mallet toe with ulceration left hallux  Plan:  Debride wound and get Gram Stain and Sensitivity. Instructed her on how to dress this.  Discussed possible need for surgery.  Follow up in two weeks. She will meet with Liliane Channel to make insole to help prevent pressure wound.

## 2017-02-15 NOTE — Telephone Encounter (Addendum)
-----   Message from Garrel Ridgel, [place] sent at 02/15/2017  9:07 AM EDT ----- Needs to start levaquin 500mg  qd for two weeks.  Staph infection from drainage from her hallux. Left message informing pt of Dr. Stephenie Acres review of results and orders.

## 2017-03-01 ENCOUNTER — Other Ambulatory Visit: Payer: Medicare Other

## 2017-03-03 ENCOUNTER — Other Ambulatory Visit: Payer: Medicare Other

## 2017-03-09 DIAGNOSIS — H5203 Hypermetropia, bilateral: Secondary | ICD-10-CM | POA: Diagnosis not present

## 2017-03-09 DIAGNOSIS — H524 Presbyopia: Secondary | ICD-10-CM | POA: Diagnosis not present

## 2017-03-09 DIAGNOSIS — H25813 Combined forms of age-related cataract, bilateral: Secondary | ICD-10-CM | POA: Diagnosis not present

## 2017-03-09 DIAGNOSIS — H52223 Regular astigmatism, bilateral: Secondary | ICD-10-CM | POA: Diagnosis not present

## 2017-03-09 DIAGNOSIS — H43813 Vitreous degeneration, bilateral: Secondary | ICD-10-CM | POA: Diagnosis not present

## 2017-03-18 DIAGNOSIS — R8299 Other abnormal findings in urine: Secondary | ICD-10-CM | POA: Diagnosis not present

## 2017-03-18 DIAGNOSIS — I10 Essential (primary) hypertension: Secondary | ICD-10-CM | POA: Diagnosis not present

## 2017-03-18 DIAGNOSIS — E038 Other specified hypothyroidism: Secondary | ICD-10-CM | POA: Diagnosis not present

## 2017-03-25 DIAGNOSIS — E668 Other obesity: Secondary | ICD-10-CM | POA: Diagnosis not present

## 2017-03-25 DIAGNOSIS — M5430 Sciatica, unspecified side: Secondary | ICD-10-CM | POA: Diagnosis not present

## 2017-03-25 DIAGNOSIS — I2510 Atherosclerotic heart disease of native coronary artery without angina pectoris: Secondary | ICD-10-CM | POA: Diagnosis not present

## 2017-03-25 DIAGNOSIS — Z6828 Body mass index (BMI) 28.0-28.9, adult: Secondary | ICD-10-CM | POA: Diagnosis not present

## 2017-03-25 DIAGNOSIS — Z0000 Encounter for general adult medical examination without abnormal findings: Secondary | ICD-10-CM | POA: Diagnosis not present

## 2017-03-25 DIAGNOSIS — Z8679 Personal history of other diseases of the circulatory system: Secondary | ICD-10-CM | POA: Diagnosis not present

## 2017-03-25 DIAGNOSIS — Z86711 Personal history of pulmonary embolism: Secondary | ICD-10-CM | POA: Diagnosis not present

## 2017-03-25 DIAGNOSIS — M109 Gout, unspecified: Secondary | ICD-10-CM | POA: Diagnosis not present

## 2017-03-25 DIAGNOSIS — E038 Other specified hypothyroidism: Secondary | ICD-10-CM | POA: Diagnosis not present

## 2017-03-25 DIAGNOSIS — Z1389 Encounter for screening for other disorder: Secondary | ICD-10-CM | POA: Diagnosis not present

## 2017-03-25 DIAGNOSIS — I10 Essential (primary) hypertension: Secondary | ICD-10-CM | POA: Diagnosis not present

## 2017-03-25 DIAGNOSIS — R740 Nonspecific elevation of levels of transaminase and lactic acid dehydrogenase [LDH]: Secondary | ICD-10-CM | POA: Diagnosis not present

## 2017-03-25 DIAGNOSIS — M1180 Other specified crystal arthropathies, unspecified site: Secondary | ICD-10-CM | POA: Diagnosis not present

## 2017-03-26 DIAGNOSIS — Z01419 Encounter for gynecological examination (general) (routine) without abnormal findings: Secondary | ICD-10-CM | POA: Diagnosis not present

## 2017-03-30 DIAGNOSIS — M1120 Other chondrocalcinosis, unspecified site: Secondary | ICD-10-CM | POA: Diagnosis not present

## 2017-03-30 DIAGNOSIS — Z6837 Body mass index (BMI) 37.0-37.9, adult: Secondary | ICD-10-CM | POA: Diagnosis not present

## 2017-03-30 DIAGNOSIS — M150 Primary generalized (osteo)arthritis: Secondary | ICD-10-CM | POA: Diagnosis not present

## 2017-03-30 DIAGNOSIS — M2550 Pain in unspecified joint: Secondary | ICD-10-CM | POA: Diagnosis not present

## 2017-03-30 DIAGNOSIS — E669 Obesity, unspecified: Secondary | ICD-10-CM | POA: Diagnosis not present

## 2017-03-30 DIAGNOSIS — Z79899 Other long term (current) drug therapy: Secondary | ICD-10-CM | POA: Diagnosis not present

## 2017-03-31 DIAGNOSIS — Z1231 Encounter for screening mammogram for malignant neoplasm of breast: Secondary | ICD-10-CM | POA: Diagnosis not present

## 2017-04-27 ENCOUNTER — Telehealth: Payer: Self-pay | Admitting: Cardiology

## 2017-04-27 NOTE — Telephone Encounter (Signed)
Pt was scheduled a follow-up appt with an Extender on Dr [employer] team, for complaints of arrhythmia, noted on recent monitor.  Pt was a previous Dr Radford Pax pt and is to transfer care to Dr Meda Coffee.  In Dr Francesca Oman absence from the office next week, pt is to see an Extender, for complaint mentioned, then follow-up with Dr Meda Coffee can be made at that appt.  Pt to see Melina Copa PA-C on next Thursday 7/12, at 2 pm. Pt aware of appt date and time.

## 2017-04-27 NOTE — Telephone Encounter (Signed)
Scheduler to call the pt and arrange for new pt appt with Dr Meda Coffee.  This was a former Dr Radford Pax pt, who was released into Dr Nelson's care.  Scheduling Supervisor, to give the pt a call back today, to make her appt.

## 2017-04-27 NOTE — Telephone Encounter (Signed)
New Message:    Pt said she was instructed by Dr Meda Coffee to call you,so you could schedule her an appointment.

## 2017-05-05 NOTE — Progress Notes (Signed)
Cardiology Office Note:    Date:  05/06/2017   ID:  Elizabeth Wang, DOB 1946/10/22, MRN [id number]  PCP:  [name], MD  Cardiologist:  Dr Meda Coffee  Referring MD: [name], MD   Chief Complaint  Patient presents with  . Palpitations    History of Present Illness:    Elizabeth Wang is a 71 y.o. female with a past medical history significant for Paroxysmal atrial fibrillation (1 episode in the setting of acute distress and 2014), hypertension, right leg DVT and PE in 06/2013 (provoked by travel and dehydration) .   The patient's episode of atrial fibrillation occurred during a time of acute stress is much more than her usual. She was symptomatic with this and has not had any recurrence since. It is thought that she would be able to detect recurrence. She was continued on diltiazem for control. She was seen by hematologist at [facility] who felt that her risk of CVA was slow and she could come off of her Xarelto. She was placed on 162 mg of aspirin daily. Her CHAD2VASC score is totally. The plan is that if she develops atrial fibrillation again that we would encourage anticoagulation.  The patient had an echocardiogram in 06/2013 that showed normal LVEF, trivial AR, moderately to severely calcified mitral annulus.  The patient comes in today Alone. She brings with her a printout of some of her heart rhythms from a heart rhythm monitoring app called Jodelle Red, which she specifically purchased after she began having palpitations about a month or so ago.  This clearly shows bouts of atrial fibrillation which she caught as a result of monitoring her heart rhythm at times when she was feeling her heart fluttering. These episodes of palpitations occur 2-3 times per week, usually in the mornings after she's had coffee, and usually lasts around 15-20 minutes. Today she had an espresso for lunch and although her EKG here shows normal sinus rhythm she used her Jodelle Red app which clearly showed sinus rhythm and in  and out of atrial fibrillation in the 120s. I tried to catch the atrial fib on a rhythm strip EKG here but it only showed normal sinus rhythm.  The patient has occasional mild shortness of breath not related to any activity, at random times but she is able to climb 2 flights of stairs without any exertional dyspnea or chest discomfort. She spends a lot of time at the [place] walking and climbing a hill up to the house from the [place] without any problems. She denies orthopnea, PND, peripheral edema or lightheadedness. Her pulse oximetry is 99% on room air. She has no signs or symptoms of a PE or extremity DVT.   Past Medical History:  Diagnosis Date  . Acute pulmonary embolism ([place]) 06/2013  . Arthritis   . Asthma    years ago   . Clotting disorder (HCC)    DVT/PE  . Diverticulosis    minor   . Dyslipidemia   . Esophageal reflux   . Hypertension   . Hypothyroidism   . Numbness    TOES  . Obesity   . Paroxysmal atrial fibrillation (HCC)    1 isolated episode in the setting of acute stress with no further episodes.   . Pseudogout   . Sleep apnea    "not bad enough to use c- pap"    Past Surgical History:  Procedure Laterality Date  . BREAST RECONSTRUCTION    . CESAREAN SECTION    . COLONOSCOPY  2004  repeat in ten years  . KNEE ARTHROSCOPY Left   . TONSILLECTOMY    . 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 TOTAL KNEE ARTHROPLASTY Left 07/23/2014   Procedure: LEFT TOTAL KNEE ARTHROPLASTY;  Surgeon: [name], MD;  Location: [facility];  Service: Orthopedics;  Laterality: Left;  . tummy tuck  1987  . WISDOM TOOTH EXTRACTION      Current Medications: Current Meds  Medication Sig  . cloNIDine (CATAPRES) 0.3 MG tablet Take 0.15 mg by mouth 2 (two) times daily. For HTN  . colchicine 0.6 MG tablet Take 0.6 mg by mouth 2 (two) times daily. For  gout  . diclofenac (VOLTAREN) 75 MG EC tablet Take 75 mg by mouth 2 (two) times daily.  [name] EPINEPHrine (EPIPEN) 0.3 mg/0.3 mL SOAJ injection Inject 0.3 mg into the muscle as needed (Allergic Reaction).   . gabapentin (NEURONTIN) 300 MG capsule Take 300 mg by mouth at bedtime.  [name] levothyroxine (SYNTHROID, LEVOTHROID) 125 MCG tablet Take 125 mcg by mouth daily before breakfast. For thyroid therapy  . Rosuvastatin Calcium (CRESTOR PO) Take 1 tablet by mouth at bedtime. Patient unaware of the dosage.  . valsartan (DIOVAN) 160 MG tablet Take 160 mg by mouth 2 (two) times daily.   [name] zolpidem (AMBIEN) 5 MG tablet Take 5 mg by mouth at bedtime as needed for sleep.  . [DISCONTINUED] aspirin EC 81 MG tablet Take 2 tablets (162 mg total) by mouth daily.  . [DISCONTINUED] diltiazem (CARDIZEM CD) 240 MG 24 hr capsule Take 1 capsule (240 mg total) by mouth daily.     Allergies:   Nsaids and Codeine   Social History   Social History  . Marital status: Married    Spouse name: N/A  . Number of children: N/A  . Years of education: N/A   Occupational History  . Financial Advisor    Social History Main Topics  . Smoking status: Former Smoker    Packs/day: 0.25    Years: 12.00    Quit date: 12/28/1975  . Smokeless tobacco: Never Used  . Alcohol use 2.4 oz/week    4 Glasses of wine per week     Comment: socially  . Drug use: No  . Sexual activity: Not Asked   Other Topics Concern  . None   Social History Narrative   Lives in [place] with her husband. Exercises at the gym 4 times a week.     Family History: The patient's family history includes CVA in her mother; Coronary artery disease in her father; Heart disease in her father; Hypertension in her father. ROS:   Please see the history of present illness.     All other systems reviewed and are negative.  EKGs/Labs/Other Studies Reviewed:    The following studies were reviewed today:  Echocardiogram 07/15/2013 Study Conclusions  -  Left ventricle: The cavity size was normal. Wall thickness was increased in a pattern of severe LVH. Systolic function was normal. The estimated ejection fraction was in the range of 60% to 65%. Wall motion was normal; there were no regional wall motion abnormalities. Features are consistent with a pseudonormal left ventricular filling pattern, with concomitant abnormal relaxation and increased filling pressure (grade 2 diastolic dysfunction). Doppler parameters are consistent with high ventricular filling pressure. - Aortic valve: Sclerosis without stenosis. Trivial regurgitation. Valve area: 2.21cm^2(VTI). Valve area: 1.92cm^2 (  Vmax). - Mitral valve: Moderately to severely calcified annulus. - Pulmonary arteries: PA peak pressure: 32mm Hg (S).  EKG:  EKG is  ordered today.  The ekg ordered today demonstrates Sinus rhythm with first-degree AV block, PR interval 230, minimal criteria for LVH, maybe normal variant, no changes from previous  Recent Labs: No results found for requested labs within last 8760 hours.   Recent Lipid Panel No results found for: CHOL, TRIG, HDL, CHOLHDL, VLDL, LDLCALC, LDLDIRECT  Physical Exam:    VS:  BP 122/80   Pulse 79   Ht 5' 3.5" (1.613 m)   Wt 217 lb 3.2 oz (98.5 kg)   LMP  (LMP Unknown)   BMI 37.87 kg/m     Wt Readings from Last 3 Encounters:  05/06/17 217 lb 3.2 oz (98.5 kg)  12/18/16 219 lb 12.8 oz (99.7 kg)  10/30/15 214 lb (97.1 kg)     GEN:  Well nourished, well developed in no acute distress HEENT: Normal NECK: No JVD; No carotid bruits LYMPHATICS: No lymphadenopathy CARDIAC: RRR, no murmurs, rubs, gallops RESPIRATORY:  Clear to auscultation without rales, wheezing or rhonchi  ABDOMEN: Soft, non-tender, non-distended MUSCULOSKELETAL:  No edema; No deformity  SKIN: Warm and dry NEUROLOGIC:  Alert and oriented x 3 PSYCHIATRIC:  Normal affect   ASSESSMENT:    1. Paroxysmal atrial fibrillation (HCC)   2.  Essential hypertension, benign    PLAN:    In order of problems listed above:  Paroxysmal atrial fibrillation: Patient with previous single episode of atrial fibrillation during an episode of DVT and PE. Patient has been doing well on diltiazem in the Xarelto was changed to aspirin, however she began having palpitations again about a month or so ago. This is captured on her K[employer]ia monitoring app. She is having no associated chest discomfort or dyspnea. CHADS2VASC 3 (HTN, Age, Female). Will restart Xarelto 20 mg for stroke risk reduction and stop aspirin. Check echocardiogram. Increase diltiazem to 360 mg daily for heart rate control and wean off clonidine (patient was already trying to wean off of this) to allow for room and blood pressure. Check basic metabolic panel, CBC in light of starting anticoagulation, and thyroid function. Follow-up in 2-4 weeks to assess response to medication changes  Hypertension: Blood pressure is well controlled and patient has been trying to wean off clonidine. As above will increase diltiazem for better heart rate control of atrial fibrillation and continue to wean off clonidine over the next week. Patient will monitor her blood pressure intermittently at home. If blood pressure comes down to much with diltiazem could decrease her ARB.  Medication Adjustments/Labs and Tests Ordered: Current medicines are reviewed at length with the patient today.  Concerns regarding medicines are outlined above. Labs and tests ordered and medication changes are outlined in the patient instructions below:  Patient Instructions  Medication Instructions:  1. STOP ASPIRIN  2. INCREASE DILTIAZEM CD TO 360 MG DAILY; NEW RX HAS BEEN SENT IN   3. START XARELTO 20 MG ONCE A DAY AT SUPPER TIME  4. YOU HAVE BEEN ADVISED PER NINA Roderick Sweezy, NP TO WEAN OFF THE CLONIDINE; THE NP HAS GONE OVER INSTRUCTIONS AS TO HOW TO WEAN OFF THIS MEDICATION  Labwork: TODAY BMET, CBC W/DIFF,  TSH  Testing/Procedures: Your physician has requested that you have an echocardiogram. Echocardiography is a painless test that uses sound waves to create images of your heart. It provides your doctor with information about the size and shape of your heart  and how well your heart's chambers and valves are working. This procedure takes approximately one hour. There are no restrictions for this procedure. NOT AN URGENT ECHO THOUGH MAYBE CAN BE DONE IN THE NEXT 2 WEEKS IF POSSIBLE  Follow-Up: 2-4 WEEKS WITH DR. Meda Coffee OR APP  Any Other Special Instructions Will Be Listed Below (If Applicable).     If you need a refill on your cardiac medications before your next appointment, please call your pharmacy.      Signed, Daune Perch, NP  05/06/2017 3:17 PM    [facility]

## 2017-05-06 ENCOUNTER — Encounter: Payer: Self-pay | Admitting: Cardiology

## 2017-05-06 ENCOUNTER — Ambulatory Visit (INDEPENDENT_AMBULATORY_CARE_PROVIDER_SITE_OTHER): Payer: Medicare Other | Admitting: Cardiology

## 2017-05-06 ENCOUNTER — Encounter (INDEPENDENT_AMBULATORY_CARE_PROVIDER_SITE_OTHER): Payer: Self-pay

## 2017-05-06 VITALS — BP 122/80 | HR 79 | Ht 63.5 in | Wt 217.2 lb

## 2017-05-06 DIAGNOSIS — I480 Paroxysmal atrial fibrillation: Secondary | ICD-10-CM | POA: Diagnosis not present

## 2017-05-06 DIAGNOSIS — I10 Essential (primary) hypertension: Secondary | ICD-10-CM | POA: Diagnosis not present

## 2017-05-06 MED ORDER — RIVAROXABAN 20 MG PO TABS: 20.0000 mg | ORAL_TABLET | Freq: Every day | ORAL | 3 refills | Status: DC

## 2017-05-06 MED ORDER — DILTIAZEM HCL ER COATED BEADS 360 MG PO CP24: 360.0000 mg | ORAL_CAPSULE | Freq: Every day | ORAL | 3 refills | Status: DC

## 2017-05-06 NOTE — Patient Instructions (Addendum)
Medication Instructions:  1. STOP ASPIRIN  2. INCREASE DILTIAZEM CD TO 360 MG DAILY; NEW RX HAS BEEN SENT IN   3. START XARELTO 20 MG ONCE A DAY AT SUPPER TIME  4. YOU HAVE BEEN ADVISED PER NINA HAMMOND, NP TO WEAN OFF THE CLONIDINE; THE NP HAS GONE OVER INSTRUCTIONS AS TO HOW TO WEAN OFF THIS MEDICATION  Labwork: TODAY BMET, CBC W/DIFF, TSH  Testing/Procedures: Your physician has requested that you have an echocardiogram. Echocardiography is a painless test that uses sound waves to create images of your heart. It provides your doctor with information about the size and shape of your heart and how well your heart's chambers and valves are working. This procedure takes approximately one hour. There are no restrictions for this procedure. NOT AN URGENT ECHO THOUGH MAYBE CAN BE DONE IN THE NEXT 2 WEEKS IF POSSIBLE  Follow-Up: 2-4 WEEKS WITH DR. Meda Coffee OR APP  Any Other Special Instructions Will Be Listed Below (If Applicable).     If you need a refill on your cardiac medications before your next appointment, please call your pharmacy.

## 2017-05-07 ENCOUNTER — Other Ambulatory Visit: Payer: Self-pay | Admitting: *Deleted

## 2017-05-07 DIAGNOSIS — R748 Abnormal levels of other serum enzymes: Secondary | ICD-10-CM

## 2017-05-07 LAB — BASIC METABOLIC PANEL
BUN / CREAT RATIO: 26 (ref 12–28)
BUN: 30 mg/dL — ABNORMAL HIGH (ref 8–27)
CO2: 26 mmol/L (ref 20–29)
Calcium: 9.3 mg/dL (ref 8.7–10.3)
Chloride: 100 mmol/L (ref 96–106)
Creatinine, Ser: 1.15 mg/dL — ABNORMAL HIGH (ref 0.57–1.00)
GFR, EST AFRICAN AMERICAN: 55 mL/min/{1.73_m2} — AB (ref >59)
GFR, EST NON AFRICAN AMERICAN: 48 mL/min/{1.73_m2} — AB (ref >59)
Glucose: 95 mg/dL (ref 65–99)
Potassium: 4.4 mmol/L (ref 3.5–5.2)
Sodium: 140 mmol/L (ref 134–144)

## 2017-05-07 LAB — CBC WITH DIFFERENTIAL/PLATELET
BASOS: 1 %
Basophils Absolute: 0.1 10*3/uL (ref 0.0–0.2)
EOS (ABSOLUTE): 0.5 10*3/uL — ABNORMAL HIGH (ref 0.0–0.4)
EOS: 9 %
HEMATOCRIT: 38.1 % (ref 34.0–46.6)
HEMOGLOBIN: 12.7 g/dL (ref 11.1–15.9)
Immature Grans (Abs): 0 10*3/uL (ref 0.0–0.1)
Immature Granulocytes: 0 %
LYMPHS ABS: 2.3 10*3/uL (ref 0.7–3.1)
Lymphs: 38 %
MCH: 32.2 pg (ref 26.6–33.0)
MCHC: 33.3 g/dL (ref 31.5–35.7)
MCV: 97 fL (ref 79–97)
Monocytes Absolute: 0.7 10*3/uL (ref 0.1–0.9)
Monocytes: 11 %
Neutrophils Absolute: 2.5 10*3/uL (ref 1.4–7.0)
Neutrophils: 41 %
Platelets: 168 10*3/uL (ref 150–379)
RBC: 3.94 x10E6/uL (ref 3.77–5.28)
RDW: 13.3 % (ref 12.3–15.4)
WBC: 6.1 10*3/uL (ref 3.4–10.8)

## 2017-05-07 LAB — TSH: TSH: 2.77 u[IU]/mL (ref 0.450–4.500)

## 2017-05-10 ENCOUNTER — Encounter: Payer: Self-pay | Admitting: Cardiology

## 2017-05-11 ENCOUNTER — Telehealth: Payer: Self-pay | Admitting: Cardiology

## 2017-05-11 NOTE — Telephone Encounter (Signed)
Called patient about her message. Patient stated she has been having SOB and some chest tightness that started yesterday. Patient stated she felt worse yesterday and feels better today. Patient states she will get episodes of a racing heart up to HR 120. Patient stated she just does not feel good, and that she feels weak. Patient stated she did not have these symptoms last week at her appt. Made an appointment with [facility] for patient tomorrow. Informed patient to go to ED if her symptoms come back and/or get worse. Sent patient's MyChart message to Daune Perch NP to advise further.

## 2017-05-11 NOTE — Telephone Encounter (Signed)
New message    Pt is calling about message from patient portal.   Patient c/o Palpitations:  High priority if patient c/o lightheadedness and shortness of breath.  1. How long have you been having palpitations? Started over the weekend and were really bad yesterday  2. Are you currently experiencing lightheadedness and shortness of breath? SOB  3. Have you checked your BP and heart rate? (document readings)   4. Are you experiencing any other symptoms? Chest tightness

## 2017-05-12 ENCOUNTER — Encounter (HOSPITAL_COMMUNITY): Payer: Self-pay | Admitting: Nurse Practitioner

## 2017-05-12 ENCOUNTER — Ambulatory Visit (HOSPITAL_COMMUNITY)
Admission: RE | Admit: 2017-05-12 | Discharge: 2017-05-12 | Disposition: A | Discharge status: Home / Self Care | Payer: Medicare Other | Source: Ambulatory Visit | Attending: Nurse Practitioner | Admitting: Nurse Practitioner

## 2017-05-12 VITALS — BP 110/72 | HR 90 | Ht 63.5 in | Wt 218.6 lb

## 2017-05-12 DIAGNOSIS — Z9889 Other specified postprocedural states: Secondary | ICD-10-CM | POA: Diagnosis not present

## 2017-05-12 DIAGNOSIS — E039 Hypothyroidism, unspecified: Secondary | ICD-10-CM | POA: Insufficient documentation

## 2017-05-12 DIAGNOSIS — Z86711 Personal history of pulmonary embolism: Secondary | ICD-10-CM | POA: Insufficient documentation

## 2017-05-12 DIAGNOSIS — I480 Paroxysmal atrial fibrillation: Secondary | ICD-10-CM | POA: Insufficient documentation

## 2017-05-12 DIAGNOSIS — E669 Obesity, unspecified: Secondary | ICD-10-CM | POA: Diagnosis not present

## 2017-05-12 DIAGNOSIS — M1990 Unspecified osteoarthritis, unspecified site: Secondary | ICD-10-CM | POA: Diagnosis not present

## 2017-05-12 DIAGNOSIS — G4730 Sleep apnea, unspecified: Secondary | ICD-10-CM | POA: Diagnosis not present

## 2017-05-12 DIAGNOSIS — Z823 Family history of stroke: Secondary | ICD-10-CM | POA: Insufficient documentation

## 2017-05-12 DIAGNOSIS — J45909 Unspecified asthma, uncomplicated: Secondary | ICD-10-CM | POA: Insufficient documentation

## 2017-05-12 DIAGNOSIS — Z886 Allergy status to analgesic agent status: Secondary | ICD-10-CM | POA: Insufficient documentation

## 2017-05-12 DIAGNOSIS — Z96653 Presence of artificial knee joint, bilateral: Secondary | ICD-10-CM | POA: Diagnosis not present

## 2017-05-12 DIAGNOSIS — I10 Essential (primary) hypertension: Secondary | ICD-10-CM | POA: Diagnosis not present

## 2017-05-12 DIAGNOSIS — Z87891 Personal history of nicotine dependence: Secondary | ICD-10-CM | POA: Insufficient documentation

## 2017-05-12 DIAGNOSIS — K219 Gastro-esophageal reflux disease without esophagitis: Secondary | ICD-10-CM | POA: Diagnosis not present

## 2017-05-12 DIAGNOSIS — Z885 Allergy status to narcotic agent status: Secondary | ICD-10-CM | POA: Insufficient documentation

## 2017-05-12 DIAGNOSIS — Z7901 Long term (current) use of anticoagulants: Secondary | ICD-10-CM | POA: Diagnosis not present

## 2017-05-12 DIAGNOSIS — E785 Hyperlipidemia, unspecified: Secondary | ICD-10-CM | POA: Insufficient documentation

## 2017-05-12 DIAGNOSIS — Z8249 Family history of ischemic heart disease and other diseases of the circulatory system: Secondary | ICD-10-CM | POA: Insufficient documentation

## 2017-05-13 ENCOUNTER — Telehealth: Payer: Self-pay | Admitting: *Deleted

## 2017-05-13 ENCOUNTER — Other Ambulatory Visit: Payer: Medicare Other | Admitting: *Deleted

## 2017-05-13 DIAGNOSIS — R748 Abnormal levels of other serum enzymes: Secondary | ICD-10-CM

## 2017-05-13 DIAGNOSIS — I480 Paroxysmal atrial fibrillation: Secondary | ICD-10-CM

## 2017-05-13 LAB — BASIC METABOLIC PANEL
BUN/Creatinine Ratio: 19 (ref 12–28)
BUN: 20 mg/dL (ref 8–27)
CHLORIDE: 98 mmol/L (ref 96–106)
CO2: 22 mmol/L (ref 20–29)
Calcium: 9.8 mg/dL (ref 8.7–10.3)
Creatinine, Ser: 1.03 mg/dL — ABNORMAL HIGH (ref 0.57–1.00)
GFR calc non Af Amer: 55 mL/min/{1.73_m2} — ABNORMAL LOW (ref >59)
GFR, EST AFRICAN AMERICAN: 63 mL/min/{1.73_m2} (ref >59)
GLUCOSE: 95 mg/dL (ref 65–99)
POTASSIUM: 4.6 mmol/L (ref 3.5–5.2)
Sodium: 138 mmol/L (ref 134–144)

## 2017-05-13 NOTE — Telephone Encounter (Signed)
-----   Message from Iona Hansen, [place] sent at 05/13/2017  9:22 AM EDT ----- [name], NP would like this patient to have a sleep study.  Thank you!

## 2017-05-13 NOTE — Progress Notes (Signed)
Primary Care Physician: [name], MD Referring Physician: Daune Perch, PA Cardiologist: Dr. Noel Christmas Elizabeth Wang is a 71 y.o. female with a h/o  significant for Paroxysmal atrial fibrillation (1 episode in the setting of acute distress and 2014), hypertension, right leg DVT and PE in 06/2013 (provoked by travel and dehydration) .   The patient's episode of atrial fibrillation occurred during a time of acute stress is much more than her usual. She was symptomatic with this and has not had any recurrence since. It is thought that she would be able to detect recurrence. She was continued on diltiazem for control. She was seen by hematologist at [facility] who felt that her risk of CVA was slow and she could come off of her Xarelto. She was placed on 162 mg of aspirin daily. The plan was that if she develops atrial fibrillation again that we would encourage anticoagulation.  The patient had an echocardiogram in 06/2013 that showed normal LVEF, trivial AR, moderately to severely calcified mitral annulus.  Today, she is in the afib clinic for evaluation for continuation of afib symptoms. She is pending an echo next week. Kardia strips are reviewed showing evidence of paroxysmal afib. She is now back on xarelto for a chadsvasc score of at least 3.  She was drinking almost nightly alcohol which she has elevated. She has also given up caffeine. She  does  have symptoms of sleep apnea per husband and had a remote sleep study, which at the time was borderline.Hsband confirms that the pt does snore and has periods of apnea. She does have fatigue with episodes of afib. Cardizem was recently increased to 360 mg daily and she is weaning off clonidine.  Today, she denies symptoms of palpitations, chest pain, shortness of breath, orthopnea, PND, lower extremity edema, dizziness, presyncope, syncope, or neurologic sequela. The patient is tolerating medications without difficulties and is otherwise without  complaint today.   Past Medical History:  Diagnosis Date  . Acute pulmonary embolism ([place]) 06/2013  . Arthritis   . Asthma    years ago   . Clotting disorder (HCC)    DVT/PE  . Diverticulosis    minor   . Dyslipidemia   . Esophageal reflux   . Hypertension   . Hypothyroidism   . Numbness    TOES  . Obesity   . Paroxysmal atrial fibrillation (HCC)    1 isolated episode in the setting of acute stress with no further episodes.   . Pseudogout   . Sleep apnea    "not bad enough to use c- pap"   Past Surgical History:  Procedure Laterality Date  . BREAST RECONSTRUCTION    . CESAREAN SECTION    . COLONOSCOPY  2004   repeat in ten years  . KNEE ARTHROSCOPY Left   . TONSILLECTOMY    . 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 TOTAL KNEE ARTHROPLASTY Left 07/23/2014   Procedure: LEFT TOTAL KNEE ARTHROPLASTY;  Surgeon: [name], MD;  Location: [facility];  Service: Orthopedics;  Laterality: Left;  . tummy tuck  1987  . WISDOM TOOTH EXTRACTION      Current Outpatient Prescriptions  Medication Sig Dispense Refill  . cloNIDine (CATAPRES) 0.3 MG tablet Take 0.15 mg by mouth daily. For HTN    . colchicine 0.6 MG tablet Take 0.6 mg by  mouth 2 (two) times daily. For gout    . diclofenac (VOLTAREN) 75 MG EC tablet Take 75 mg by mouth daily.     [name] diltiazem (CARDIZEM CD) 360 MG 24 hr capsule Take 1 capsule (360 mg total) by mouth daily. 90 capsule 3  . EPINEPHrine (EPIPEN) 0.3 mg/0.3 mL SOAJ injection Inject 0.3 mg into the muscle as needed (Allergic Reaction).     . gabapentin (NEURONTIN) 300 MG capsule Take 300 mg by mouth at bedtime.    [name] levothyroxine (SYNTHROID, LEVOTHROID) 125 MCG tablet Take 125 mcg by mouth daily before breakfast. For thyroid therapy    . rivaroxaban (XARELTO) 20 MG TABS tablet Take 1 tablet (20 mg total) by mouth daily with  supper. 90 tablet 3  . Rosuvastatin Calcium (CRESTOR PO) Take 1 tablet by mouth at bedtime. Patient unaware of the dosage.    . valsartan (DIOVAN) 160 MG tablet Take 160 mg by mouth 2 (two) times daily.     [name] zolpidem (AMBIEN) 5 MG tablet Take 5 mg by mouth at bedtime as needed for sleep.     No current facility-administered medications for this encounter.     Allergies  Allergen Reactions  . Nsaids Other (See Comments)    significantly raises BP  . Codeine Nausea Only    Social History   Social History  . Marital status: Married    Spouse name: N/A  . Number of children: N/A  . Years of education: N/A   Occupational History  . Financial Advisor    Social History Main Topics  . Smoking status: Former Smoker    Packs/day: 0.25    Years: 12.00    Quit date: 12/28/1975  . Smokeless tobacco: Never Used  . Alcohol use 2.4 oz/week    4 Glasses of wine per week     Comment: socially  . Drug use: No  . Sexual activity: Not on file   Other Topics Concern  . Not on file   Social History Narrative   Lives in [place] with her husband. Exercises at the gym 4 times a week.    Family History  Problem Relation Age of Onset  . Heart disease Father   . Hypertension Father   . Coronary artery disease Father   . CVA Mother     ROS- All systems are reviewed and negative except as per the HPI above  Physical Exam: Vitals:   05/12/17 1103  BP: 110/72  Pulse: 90  Weight: 218 lb 9.6 oz (99.2 kg)  Height: 5' 3.5" (1.613 m)   Wt Readings from Last 3 Encounters:  05/12/17 218 lb 9.6 oz (99.2 kg)  05/06/17 217 lb 3.2 oz (98.5 kg)  12/18/16 219 lb 12.8 oz (99.7 kg)    Labs: Lab Results  Component Value Date   NA 140 05/06/2017   K 4.4 05/06/2017   CL 100 05/06/2017   CO2 26 05/06/2017   GLUCOSE 95 05/06/2017   BUN 30 (H) 05/06/2017   CREATININE 1.15 (H) 05/06/2017   CALCIUM 9.3 05/06/2017   Lab Results  Component Value Date   INR 0.94 07/18/2014   No results  found for: CHOL, HDL, LDLCALC, TRIG   GEN- The patient is well appearing, alert and oriented x 3 today.   Head- normocephalic, atraumatic Eyes-  Sclera clear, conjunctiva pink Ears- hearing intact Oropharynx- clear Neck- supple, no JVP Lymph- no cervical lymphadenopathy Lungs- Clear to ausculation bilaterally, normal work of breathing Heart- Regular rate and rhythm,  no murmurs, rubs or gallops, PMI not laterally displaced GI- soft, NT, ND, + BS Extremities- no clubbing, cyanosis, or edema MS- no significant deformity or atrophy Skin- no rash or lesion Psych- euthymic mood, full affect Neuro- strength and sensation are intact  EKG-SR with 1st degree AV block Possible left atril enlargement LAD, LVH. Pr int 224 ms, qrs int 114 ms, qtc 445 ms  Echo-2014 Left ventricle: The cavity size was normal. Wall thickness was increased in a pattern of severe LVH. Systolic function was normal. The estimated ejection fraction was in the range of 60% to 65%. Wall motion was normal; there were no regional wall motion abnormalities. Features are consistent with a pseudonormal left ventricular filling pattern, with concomitant abnormal relaxation and increased filling pressure (grade 2 diastolic dysfunction). Doppler parameters are consistent with high ventricular filling pressure.   Assessment and Plan: 1. Paroxysmal symptomatic afib Echo is pending and this info is needed to determine appropriate choice  of antiarrythmic. Did get echo moved up from next week to Friday. Once reviewed more definite plan can be put into place Continue cardizem 360 mg daily Continue xarelto 20 mg daily(started 7/12) for chadsvasc score of 3 Sleep study Continue to refrain from alcohol and excessive caffeine Weight loss/exercise encouraged  2. HTN  Stable Weaning off clonidine  Pt will be notified once echo is reviewed for further plan of treatment   Butch Penny C. Thurza Kwiecinski, [facility] [address] [place], [place] [address] [phone]

## 2017-05-13 NOTE — Telephone Encounter (Signed)
Per [name] split night study ordered. Informed patient of upcoming sleep study and patient understanding was verbalized. Patient understands her sleep study scheduled for Monday July 13 2017. Patient understands her sleep study will be done at [facility] sleep lab. Patient understands she will receive a sleep packet in a week or so. Patient understands to call if she does not receive the sleep packet in a timely manner. Patient agrees with treatment and thanked me for call.

## 2017-05-14 ENCOUNTER — Encounter: Payer: Self-pay | Admitting: Cardiology

## 2017-05-14 ENCOUNTER — Telehealth (HOSPITAL_COMMUNITY): Payer: Self-pay | Admitting: *Deleted

## 2017-05-14 ENCOUNTER — Ambulatory Visit (HOSPITAL_COMMUNITY): Payer: Medicare Other | Attending: Cardiovascular Disease

## 2017-05-14 ENCOUNTER — Other Ambulatory Visit: Payer: Self-pay

## 2017-05-14 ENCOUNTER — Other Ambulatory Visit (HOSPITAL_COMMUNITY): Payer: Self-pay | Admitting: Nurse Practitioner

## 2017-05-14 DIAGNOSIS — I420 Dilated cardiomyopathy: Secondary | ICD-10-CM | POA: Insufficient documentation

## 2017-05-14 DIAGNOSIS — I480 Paroxysmal atrial fibrillation: Secondary | ICD-10-CM | POA: Diagnosis not present

## 2017-05-14 DIAGNOSIS — I080 Rheumatic disorders of both mitral and aortic valves: Secondary | ICD-10-CM | POA: Diagnosis not present

## 2017-05-14 DIAGNOSIS — I5030 Unspecified diastolic (congestive) heart failure: Secondary | ICD-10-CM | POA: Diagnosis not present

## 2017-05-14 NOTE — Telephone Encounter (Signed)
Pt given results of echo.  Per [name], NP; echo is stable.  Mitral Valve looks better on this echo than last.  It is calcified but opening and closing okay.  Pt understood.

## 2017-05-18 ENCOUNTER — Other Ambulatory Visit (HOSPITAL_COMMUNITY): Payer: Medicare Other

## 2017-05-18 ENCOUNTER — Telehealth (HOSPITAL_COMMUNITY): Payer: Self-pay | Admitting: *Deleted

## 2017-05-18 DIAGNOSIS — T8543XA Leakage of breast prosthesis and implant, initial encounter: Secondary | ICD-10-CM | POA: Diagnosis not present

## 2017-05-18 MED ORDER — DILTIAZEM HCL ER COATED BEADS 240 MG PO CP24: 240.0000 mg | ORAL_CAPSULE | Freq: Every day | ORAL | Status: DC

## 2017-05-18 NOTE — Telephone Encounter (Signed)
Pt called in stating her HR is running the 45-50 range and BP 105/50 since increasing cardizem to 360mg . Discussed with [name] NP will decrease back to 240mg  daily and see end of week of tolerance. Pt in agreement.

## 2017-05-20 ENCOUNTER — Encounter (HOSPITAL_COMMUNITY): Payer: Self-pay | Admitting: Nurse Practitioner

## 2017-05-20 ENCOUNTER — Ambulatory Visit (HOSPITAL_COMMUNITY)
Admission: RE | Admit: 2017-05-20 | Discharge: 2017-05-20 | Disposition: A | Discharge status: Home / Self Care | Payer: Medicare Other | Source: Ambulatory Visit | Attending: Nurse Practitioner | Admitting: Nurse Practitioner

## 2017-05-20 VITALS — BP 126/82 | HR 72 | Ht 63.5 in

## 2017-05-20 DIAGNOSIS — G4730 Sleep apnea, unspecified: Secondary | ICD-10-CM | POA: Diagnosis not present

## 2017-05-20 DIAGNOSIS — Z86711 Personal history of pulmonary embolism: Secondary | ICD-10-CM | POA: Diagnosis not present

## 2017-05-20 DIAGNOSIS — I10 Essential (primary) hypertension: Secondary | ICD-10-CM | POA: Diagnosis not present

## 2017-05-20 DIAGNOSIS — Z79899 Other long term (current) drug therapy: Secondary | ICD-10-CM | POA: Insufficient documentation

## 2017-05-20 DIAGNOSIS — K219 Gastro-esophageal reflux disease without esophagitis: Secondary | ICD-10-CM | POA: Diagnosis not present

## 2017-05-20 DIAGNOSIS — Z7901 Long term (current) use of anticoagulants: Secondary | ICD-10-CM | POA: Diagnosis not present

## 2017-05-20 DIAGNOSIS — E669 Obesity, unspecified: Secondary | ICD-10-CM | POA: Diagnosis not present

## 2017-05-20 DIAGNOSIS — E785 Hyperlipidemia, unspecified: Secondary | ICD-10-CM | POA: Insufficient documentation

## 2017-05-20 DIAGNOSIS — E039 Hypothyroidism, unspecified: Secondary | ICD-10-CM | POA: Insufficient documentation

## 2017-05-20 DIAGNOSIS — I480 Paroxysmal atrial fibrillation: Secondary | ICD-10-CM | POA: Diagnosis not present

## 2017-05-20 DIAGNOSIS — Z87891 Personal history of nicotine dependence: Secondary | ICD-10-CM | POA: Diagnosis not present

## 2017-05-20 MED ORDER — DILTIAZEM HCL 30 MG PO TABS: ORAL_TABLET | ORAL | 1 refills | Status: DC

## 2017-05-20 NOTE — Progress Notes (Addendum)
Primary Care Physician: [name], MD Referring Physician: Daune Perch, PA Cardiologist: Dr. Noel Christmas Elizabeth Wang is a 71 y.o. female with a h/o  significant for Paroxysmal atrial fibrillation (1 episode in the setting of acute distress and 2014), hypertension, right leg DVT and PE in 06/2013 (provoked by travel and dehydration).  The patient's episode of atrial fibrillation occurred during a time of acute stress.  She was symptomatic with this and has not had any recurrence since. It is thought that she would be able to detect recurrence. She was continued on diltiazem for control. She was seen by hematologist at [facility] who felt that her risk of CVA was low and she could come off of her Xarelto. She was placed on 162 mg of aspirin daily. The plan was that if she developed atrial fibrillation again that anticoagulation would be restarted..  The patient had an echocardiogram in 06/2013 that showed normal LVEF, trivial AR, moderately to severely calcified mitral annulus.  Initial visit in the afib clinic, 7/18,  for  evaluation for continuation of afib symptoms. She is pending an echo next week. Kardia strips are reviewed showing evidence of paroxysmal afib. She is now back on xarelto for a chadsvasc score of at least 3.  She was drinking almost nightly alcohol which she has alleviated.  She has also given up caffeine. She  does  have symptoms of sleep apnea per husband and had a remote sleep study, which at the time was borderline.Husband confirms that the pt does snore and has periods of apnea. She does have fatigue with episodes of afib. Cardizem was recently increased to 360 mg daily and she is weaning off clonidine.  F/u afib clinic, 7/24, and she is having very little afib. Confirmed with Kardia strips. She continues to be off alcohol, caffeine. Plans to lose weight. She is happy with current management.  Today, she denies symptoms of palpitations, chest pain, shortness of breath,  orthopnea, PND, lower extremity edema, dizziness, presyncope, syncope, or neurologic sequela. The patient is tolerating medications without difficulties and is otherwise without complaint today.   Past Medical History:  Diagnosis Date  . Acute pulmonary embolism ([place]) 06/2013  . Arthritis   . Asthma    years ago   . Clotting disorder (HCC)    DVT/PE  . Diverticulosis    minor   . Dyslipidemia   . Esophageal reflux   . Hypertension   . Hypothyroidism   . Numbness    TOES  . Obesity   . Paroxysmal atrial fibrillation (HCC)    1 isolated episode in the setting of acute stress with no further episodes.   . Pseudogout   . Sleep apnea    "not bad enough to use c- pap"   Past Surgical History:  Procedure Laterality Date  . BREAST RECONSTRUCTION    . CESAREAN SECTION    . COLONOSCOPY  2004   repeat in ten years  . KNEE ARTHROSCOPY Left   . TONSILLECTOMY    . 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 TOTAL KNEE ARTHROPLASTY Left 07/23/2014   Procedure: LEFT TOTAL KNEE ARTHROPLASTY;  Surgeon: [name], MD;  Location: [facility];  Service: Orthopedics;  Laterality: Left;  . tummy tuck  1987  . WISDOM TOOTH EXTRACTION      Current Outpatient Prescriptions  Medication Sig  Dispense Refill  . cloNIDine (CATAPRES) 0.3 MG tablet Take 0.15 mg by mouth daily. For HTN    . colchicine 0.6 MG tablet Take 0.6 mg by mouth 2 (two) times daily. For gout    . diclofenac (VOLTAREN) 75 MG EC tablet Take 75 mg by mouth daily.     [name] diltiazem (CARDIZEM CD) 240 MG 24 hr capsule Take 1 capsule (240 mg total) by mouth daily.    [name] EPINEPHrine (EPIPEN) 0.3 mg/0.3 mL SOAJ injection Inject 0.3 mg into the muscle as needed (Allergic Reaction).     . gabapentin (NEURONTIN) 300 MG capsule Take 300 mg by mouth at bedtime.    [name] levothyroxine (SYNTHROID, LEVOTHROID) 125 MCG  tablet Take 125 mcg by mouth daily before breakfast. For thyroid therapy    . rivaroxaban (XARELTO) 20 MG TABS tablet Take 1 tablet (20 mg total) by mouth daily with supper. 90 tablet 3  . Rosuvastatin Calcium (CRESTOR PO) Take 1 tablet by mouth at bedtime. Patient unaware of the dosage.    . valsartan (DIOVAN) 160 MG tablet Take 160 mg by mouth 2 (two) times daily.     [name] zolpidem (AMBIEN) 5 MG tablet Take 5 mg by mouth at bedtime as needed for sleep.    [name] diltiazem (CARDIZEM) 30 MG tablet Take 1 tablet every 4 hours AS NEEDED for afib heart rate >100 45 tablet 1   No current facility-administered medications for this encounter.     Allergies  Allergen Reactions  . Nsaids Other (See Comments)    significantly raises BP  . Codeine Nausea Only    Social History   Social History  . Marital status: Married    Spouse name: N/A  . Number of children: N/A  . Years of education: N/A   Occupational History  . Financial Advisor    Social History Main Topics  . Smoking status: Former Smoker    Packs/day: 0.25    Years: 12.00    Quit date: 12/28/1975  . Smokeless tobacco: Never Used  . Alcohol use 2.4 oz/week    4 Glasses of wine per week     Comment: socially  . Drug use: No  . Sexual activity: Not on file   Other Topics Concern  . Not on file   Social History Narrative   Lives in [place] with her husband. Exercises at the gym 4 times a week.    Family History  Problem Relation Age of Onset  . Heart disease Father   . Hypertension Father   . Coronary artery disease Father   . CVA Mother     ROS- All systems are reviewed and negative except as per the HPI above  Physical Exam: Vitals:   05/20/17 0845  BP: 126/82  Pulse: 72  Height: 5' 3.5" (1.613 m)   Wt Readings from Last 3 Encounters:  05/12/17 218 lb 9.6 oz (99.2 kg)  05/06/17 217 lb 3.2 oz (98.5 kg)  12/18/16 219 lb 12.8 oz (99.7 kg)    Labs: Lab Results  Component Value Date   NA 138 05/13/2017   K  4.6 05/13/2017   CL 98 05/13/2017   CO2 22 05/13/2017   GLUCOSE 95 05/13/2017   BUN 20 05/13/2017   CREATININE 1.03 (H) 05/13/2017   CALCIUM 9.8 05/13/2017   Lab Results  Component Value Date   INR 0.94 07/18/2014   No results found for: CHOL, HDL, LDLCALC, TRIG   GEN- The patient is well appearing, alert and  oriented x 3 today.   Head- normocephalic, atraumatic Eyes-  Sclera clear, conjunctiva pink Ears- hearing intact Oropharynx- clear Neck- supple, no JVP Lymph- no cervical lymphadenopathy Lungs- Clear to ausculation bilaterally, normal work of breathing Heart- Regular rate and rhythm, no murmurs, rubs or gallops, PMI not laterally displaced GI- soft, NT, ND, + BS Extremities- no clubbing, cyanosis, or edema MS- no significant deformity or atrophy Skin- no rash or lesion Psych- euthymic mood, full affect Neuro- strength and sensation are intact  EKG-SR with 1st degree AV block, sinus arrhythmia. Possible left atril enlargement LAD, LVH. Pr int 218 ms, qrs int 102 ms, qtc 411 ms  Echo-2018 Study Conclusions  - Left ventricle: The cavity size was normal. Wall thickness was   normal. Systolic function was normal. The estimated ejection   fraction was in the range of 60% to 65%. Wall motion was normal;   there were no regional wall motion abnormalities. Doppler   parameters are consistent with abnormal left ventricular   relaxation (grade 1 diastolic dysfunction). - Aortic valve: There was mild stenosis. Valve area (VTI): 3.19   cm^2. Valve area (Vmax): 2.84 cm^2. Valve area (Vmean): 2.62   cm^2. - Mitral valve: Moderately calcified annulus. There was mild   regurgitation. Valve area by continuity equation (using LVOT   flow): 6.98 cm^2. - Left atrium: The atrium was moderately dilated.   Assessment and Plan: 1. Paroxysmal symptomatic afib Low afib burden currently Repeat echo stable   Continue cardizem at 240 mg a day,recently reduced from 360 mg a day Pt is  still weaning off clonidine Continue xarelto 20 mg daily(started 7/12) for chadsvasc score of 3 Sleep study pending Continue to refrain from alcohol and excessive caffeine Weight loss/exercise encouraged Will give Cardizem 30 mg  if needed for breakthrough afib  2. HTN  Better with lower dose of Cardizem as pt reported hypotension/lightheadedness Weaning off clonidine  F/u with Dr. Meda Coffee 8/6 afib clinic as needed   Elizabeth Wang, [facility] [address] [place], [place] [address] [phone]

## 2017-05-20 NOTE — Patient Instructions (Signed)
Your physician has recommended you make the following change in your medication:  1)Cardizem 30mg  -- take 1 tablet every 4 hours AS NEEDED for afib heart rate >100 as long as top number of your blood pressure >100.

## 2017-05-24 ENCOUNTER — Other Ambulatory Visit: Payer: Self-pay | Admitting: *Deleted

## 2017-05-24 MED ORDER — LOSARTAN POTASSIUM 50 MG PO TABS: 50.0000 mg | ORAL_TABLET | Freq: Every day | ORAL | 1 refills | Status: DC

## 2017-05-24 MED ORDER — HYDROCHLOROTHIAZIDE 25 MG PO TABS: 25.0000 mg | ORAL_TABLET | Freq: Every day | ORAL | 1 refills | Status: DC

## 2017-05-24 NOTE — Progress Notes (Signed)
NEW ORDERS PER DR. Meda Coffee FOR RECALLED VALSARTAN:   Please switch to losartan 50 mg po daily and hydrochlorothiazide 25 mg po daily.  Thank you, KN  Pt made aware to stop Valsartan and start losartan 50 mg po daily and HCTZ 25 mg po daily.  Confirmed the pharmacy of choice with the pt .  Pt verbalized understanding and agrees with this plan.  This was endorsed to the pt through her mychart account.

## 2017-05-25 NOTE — Telephone Encounter (Signed)
See my response to pt 7/16.  Daune Perch, NP

## 2017-05-30 NOTE — Progress Notes (Deleted)
Cardiology Office Note:    Date:  05/30/2017   ID:  Elizabeth Wang, DOB [birth year], MRN [id number]  PCP:  [name], MD  Cardiologist:  [name], MD   Referring MD: [name], MD   No chief complaint on file. ***  History of Present Illness:    Elizabeth Wang is a 71 y.o. female with a hx of paroxysmal atrial fibrillation (1 episode in the setting of acute distress and 2014), hypertension, right leg DVT and PE in 06/2013 (provoked by travel and dehydration) .   The patient's episode of atrial fibrillation occurred during a time of acute stress is much more than her usual. She was symptomatic with this and has not had any recurrence since. It is thought that she would beable to detect recurrence. She was continued on diltiazem for control. She was seen by hematologist at [facility] who felt that her risk of CVA was low and she could come off of her Xarelto. She was placed on 162 mg of aspirin daily. The plan was that if she develops atrial fibrillation again that we would encourage anticoagulation.  The patient had an echocardiogram in 06/2013 that showed normal LVEF, trivial AR, moderately to severely calcified mitral annulus.  She was seen in the afib clinic for evaluation for continuation of afib symptoms. Kardia strips are reviewed showing evidence of paroxysmal afib. She is now back on xarelto for a chadsvasc score of at least 3.  She was drinking almost nightly alcohol which she has elevated. She has also given up caffeine. She  does  have symptoms of sleep apnea per husband and had a remote sleep study, which at the time was borderline. Husband confirms that the pt does snore and has periods of apnea. She does have fatigue with episodes of afib. Cardizem was recently increased to 360 mg daily and she is weaning off clonidine.  Today, she denies symptoms of palpitations, chest pain, shortness of breath, orthopnea, PND, lower extremity edema, dizziness, presyncope, syncope, or  neurologic sequela. The patient is tolerating medications without difficulties and is otherwise without complaint today.  Past Medical History:  Diagnosis Date  . Acute pulmonary embolism ([place]) 06/2013  . Arthritis   . Asthma    years ago   . Clotting disorder (HCC)    DVT/PE  . Diverticulosis    minor   . Dyslipidemia   . Esophageal reflux   . Hypertension   . Hypothyroidism   . Numbness    TOES  . Obesity   . Paroxysmal atrial fibrillation (HCC)    1 isolated episode in the setting of acute stress with no further episodes.   . Pseudogout   . Sleep apnea    "not bad enough to use c- pap"    Past Surgical History:  Procedure Laterality Date  . BREAST RECONSTRUCTION    . CESAREAN SECTION    . COLONOSCOPY  2004   repeat in ten years  . KNEE ARTHROSCOPY Left   . TONSILLECTOMY    . 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 TOTAL KNEE ARTHROPLASTY Left 07/23/2014   Procedure: LEFT TOTAL KNEE ARTHROPLASTY;  Surgeon: [name], MD;  Location: [facility];  Service: Orthopedics;  Laterality: Left;  . tummy tuck  1987  . WISDOM TOOTH EXTRACTION      Current Medications: No outpatient prescriptions have been  marked as taking for the 05/31/17 encounter (Appointment) with Dorothy Spark, MD.     Allergies:   Nsaids and Codeine   Social History   Social History  . Marital status: Married    Spouse name: N/A  . Number of children: N/A  . Years of education: N/A   Occupational History  . Financial Advisor    Social History Main Topics  . Smoking status: Former Smoker    Packs/day: 0.25    Years: 12.00    Quit date: 12/28/1975  . Smokeless tobacco: Never Used  . Alcohol use 2.4 oz/week    4 Glasses of wine per week     Comment: socially  . Drug use: No  . Sexual activity: Not on file   Other Topics Concern  . Not on file    Social History Narrative   Lives in [place] with her husband. Exercises at the gym 4 times a week.     Family History: The patient's ***family history includes CVA in her mother; Coronary artery disease in her father; Heart disease in her father; Hypertension in her father. ROS:   Please see the history of present illness.    *** All other systems reviewed and are negative.  EKGs/Labs/Other Studies Reviewed:    The following studies were reviewed today: ***  EKG:  EKG is *** ordered today.  The ekg ordered today demonstrates ***  Recent Labs: 05/06/2017: Hemoglobin 12.7; Platelets 168; TSH 2.770 05/13/2017: BUN 20; Creatinine, Ser 1.03; Potassium 4.6; Sodium 138  Recent Lipid Panel No results found for: CHOL, TRIG, HDL, CHOLHDL, VLDL, LDLCALC, LDLDIRECT  Physical Exam:    VS:  LMP  (LMP Unknown)     Wt Readings from Last 3 Encounters:  05/12/17 218 lb 9.6 oz (99.2 kg)  05/06/17 217 lb 3.2 oz (98.5 kg)  12/18/16 219 lb 12.8 oz (99.7 kg)     GEN: *** Well nourished, well developed in no acute distress HEENT: Normal NECK: No JVD; No carotid bruits LYMPHATICS: No lymphadenopathy CARDIAC: ***RRR, no murmurs, rubs, gallops RESPIRATORY:  Clear to auscultation without rales, wheezing or rhonchi  ABDOMEN: Soft, non-tender, non-distended MUSCULOSKELETAL:  No edema; No deformity  SKIN: Warm and dry NEUROLOGIC:  Alert and oriented x 3 PSYCHIATRIC:  Normal affect   TTE: 05/14/17 Left ventricle: The cavity size was normal. Wall thickness was   normal. Systolic function was normal. The estimated ejection   fraction was in the range of 60% to 65%. Wall motion was normal;   there were no regional wall motion abnormalities. Doppler   parameters are consistent with abnormal left ventricular   relaxation (grade 1 diastolic dysfunction). - Aortic valve: There was mild stenosis. Valve area (VTI): 3.19   cm^2. Valve area (Vmax): 2.84 cm^2. Valve area (Vmean): 2.62   cm^2. - Mitral  valve: Moderately calcified annulus. There was mild   regurgitation. Valve area by continuity equation (using LVOT   flow): 6.98 cm^2. - Left atrium: The atrium was moderately dilated.    ASSESSMENT:    No diagnosis found.   PLAN:    In order of problems listed above:  1. Paroxysmal symptomatic afib Echo is pending and this info is needed to determine appropriate choice  of antiarrythmic. Did get echo moved up from next week to Friday. Once reviewed more definite plan can be put into place Continue cardizem 360 mg daily Continue xarelto 20 mg daily(started 7/12) for chadsvasc score of 3 Sleep study Continue to refrain from alcohol  and excessive caffeine Weight loss/exercise encouraged  2. HTN  Stable Weaning off clonidine   Medication Adjustments/Labs and Tests Ordered: Current medicines are reviewed at length with the patient today.  Concerns regarding medicines are outlined above.  No orders of the defined types were placed in this encounter.  No orders of the defined types were placed in this encounter.   Signed, [name], MD  05/30/2017 11:15 PM    [place]

## 2017-05-31 ENCOUNTER — Telehealth: Payer: Self-pay | Admitting: Cardiology

## 2017-05-31 ENCOUNTER — Other Ambulatory Visit: Payer: Self-pay | Admitting: Orthopedic Surgery

## 2017-05-31 ENCOUNTER — Ambulatory Visit: Payer: Medicare Other | Admitting: Cardiology

## 2017-05-31 ENCOUNTER — Ambulatory Visit
Admission: RE | Admit: 2017-05-31 | Discharge: 2017-05-31 | Disposition: A | Discharge status: Home / Self Care | Payer: Medicare Other | Source: Ambulatory Visit | Attending: Orthopedic Surgery | Admitting: Orthopedic Surgery

## 2017-05-31 DIAGNOSIS — S42212A Unspecified displaced fracture of surgical neck of left humerus, initial encounter for closed fracture: Secondary | ICD-10-CM

## 2017-05-31 DIAGNOSIS — S4992XA Unspecified injury of left shoulder and upper arm, initial encounter: Secondary | ICD-10-CM | POA: Diagnosis not present

## 2017-05-31 NOTE — Telephone Encounter (Signed)
I am SO SORRY to hear that. I can see them this Thursday if they are available. If not please respond to this so ai can find alternate date.

## 2017-05-31 NOTE — Telephone Encounter (Signed)
They have been rescheduled to Thursday August 9,2018 by Blima Singer

## 2017-05-31 NOTE — Telephone Encounter (Signed)
I will forward to Dr Meda Coffee for review and recommendations

## 2017-05-31 NOTE — Telephone Encounter (Signed)
Patient husband calling, he and patient were scheduled to see Dr. Meda Coffee this morning but his wife fell and broke shoulder so they had to cancel. Mr. Camerer wanted to r/s both he and his wife on same day, wife is a new patient so I did not have any early availability for both and patient  declined to see PA.   Patient husband is an established patient of Dr. Meda Coffee and wanted to send this message back, I did inform him that Karlene Einstein is out of office today.

## 2017-05-31 NOTE — Telephone Encounter (Signed)
What time on Thursday?

## 2017-06-01 ENCOUNTER — Ambulatory Visit: Payer: Medicare Other | Admitting: Physician Assistant

## 2017-06-01 DIAGNOSIS — S42212D Unspecified displaced fracture of surgical neck of left humerus, subsequent encounter for fracture with routine healing: Secondary | ICD-10-CM | POA: Diagnosis not present

## 2017-06-03 ENCOUNTER — Encounter: Payer: Self-pay | Admitting: Cardiology

## 2017-06-03 ENCOUNTER — Ambulatory Visit (INDEPENDENT_AMBULATORY_CARE_PROVIDER_SITE_OTHER): Payer: Medicare Other | Admitting: Cardiology

## 2017-06-03 VITALS — BP 126/68 | HR 70 | Ht 63.5 in | Wt 215.0 lb

## 2017-06-03 DIAGNOSIS — E782 Mixed hyperlipidemia: Secondary | ICD-10-CM

## 2017-06-03 DIAGNOSIS — I480 Paroxysmal atrial fibrillation: Secondary | ICD-10-CM

## 2017-06-03 DIAGNOSIS — I10 Essential (primary) hypertension: Secondary | ICD-10-CM

## 2017-06-03 MED ORDER — LOSARTAN POTASSIUM 100 MG PO TABS: 100.0000 mg | ORAL_TABLET | Freq: Every day | ORAL | 3 refills | Status: DC

## 2017-06-03 NOTE — Patient Instructions (Signed)
Medication Instructions:  1) STOP CLONIDINE 2) INCREASE LOSARTAN to 100 mg daily  Labwork: None  Testing/Procedures: NOne  Follow-Up: You have an appointment scheduled with Dr. Meda Coffee on August 19, 2017 at 8:00AM.  Any Other Special Instructions Will Be Listed Below (If Applicable).     If you need a refill on your cardiac medications before your next appointment, please call your pharmacy.

## 2017-06-03 NOTE — Progress Notes (Signed)
Cardiology Office Note:    Date:  06/03/17  ID:  Shirin Echeverry, DOB 06-Sep-1946, MRN [id number]  PCP:  [name], MD  Cardiologist:  [name], MD   Referring MD: [name], MD   Chief complain:   History of Present Illness:    Elizabeth Wang is a 71 y.o. female with a hx of paroxysmal atrial fibrillation (1 episode in the setting of acute distress and 2014), hypertension, right leg DVT and PE in 06/2013 (provoked by travel and dehydration) .   The patient's episode of atrial fibrillation occurred during a time of acute stress is much more than her usual. She was symptomatic with this and has not had any recurrence since. It is thought that she would beable to detect recurrence. She was continued on diltiazem for control. She was seen by hematologist at [facility] who felt that her risk of CVA was low and she could come off of her Xarelto. She was placed on 162 mg of aspirin daily. The plan was that if she develops atrial fibrillation again that we would encourage anticoagulation.  The patient had an echocardiogram in 06/2013 that showed normal LVEF, trivial AR, moderately to severely calcified mitral annulus.  She was seen in the afib clinic for evaluation for continuation of afib symptoms. Kardia strips are reviewed showing evidence of paroxysmal afib. She is now back on xarelto for a chadsvasc score of at least 3.  Today the patient states that she had an episode of A. Fib with [place] on the way here when she was stressed out, she had another episode yesterday. She has noticed that her episodes come after times when they have friends over and they have more alcohol when she is more stressed. She states that those episodes are not overly symptomatic and they're not frequent enough that she would consider ablation yet. Other than occasional bruising she hasn't noticed any bleeding with Xarelto.  Today, she denies symptoms of palpitations, chest pain, shortness of breath, orthopnea,  PND, lower extremity edema, dizziness, presyncope, syncope, or neurologic sequela. The patient is tolerating medications without difficulties and is otherwise without complaint today.  Past Medical History:  Diagnosis Date  . Acute pulmonary embolism ([place]) 06/2013  . Arthritis   . Asthma    years ago   . Clotting disorder (HCC)    DVT/PE  . Diverticulosis    minor   . Dyslipidemia   . Esophageal reflux   . Hypertension   . Hypothyroidism   . Numbness    TOES  . Obesity   . Paroxysmal atrial fibrillation (HCC)    1 isolated episode in the setting of acute stress with no further episodes.   . Pseudogout   . Sleep apnea    "not bad enough to use c- pap"    Past Surgical History:  Procedure Laterality Date  . BREAST RECONSTRUCTION    . CESAREAN SECTION    . COLONOSCOPY  2004   repeat in ten years  . KNEE ARTHROSCOPY Left   . TONSILLECTOMY    . 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 TOTAL KNEE ARTHROPLASTY Left 07/23/2014   Procedure: LEFT TOTAL KNEE ARTHROPLASTY;  Surgeon: [name], MD;  Location: [facility];  Service: Orthopedics;  Laterality: Left;  . tummy tuck  1987  . WISDOM TOOTH EXTRACTION      Current  Medications: Current Meds  Medication Sig  . colchicine 0.6 MG tablet Take 0.6 mg by mouth 2 (two) times daily. For gout  . diclofenac (VOLTAREN) 75 MG EC tablet Take 75 mg by mouth daily.   [name] diltiazem (CARDIZEM CD) 240 MG 24 hr capsule Take 1 capsule (240 mg total) by mouth daily.  [name] diltiazem (CARDIZEM) 30 MG tablet Take 1 tablet every 4 hours AS NEEDED for afib heart rate >100  . EPINEPHrine (EPIPEN) 0.3 mg/0.3 mL SOAJ injection Inject 0.3 mg into the muscle as needed (Allergic Reaction).   . gabapentin (NEURONTIN) 300 MG capsule Take 300 mg by mouth at bedtime.  . hydrochlorothiazide (HYDRODIURIL) 25 MG tablet Take 1  tablet (25 mg total) by mouth daily.  [name] levothyroxine (SYNTHROID, LEVOTHROID) 125 MCG tablet Take 125 mcg by mouth daily before breakfast. For thyroid therapy  . rivaroxaban (XARELTO) 20 MG TABS tablet Take 1 tablet (20 mg total) by mouth daily with supper.  . rosuvastatin (CRESTOR) 10 MG tablet Take 10 mg by mouth daily.  [name] zolpidem (AMBIEN) 5 MG tablet Take 5 mg by mouth at bedtime as needed for sleep.  . [DISCONTINUED] cloNIDine (CATAPRES) 0.3 MG tablet Take 0.15 mg by mouth daily. For HTN     Allergies:   Nsaids and Codeine   Social History   Social History  . Marital status: Married    Spouse name: N/A  . Number of children: N/A  . Years of education: N/A   Occupational History  . Financial Advisor    Social History Main Topics  . Smoking status: Former Smoker    Packs/day: 0.25    Years: 12.00    Quit date: 12/28/1975  . Smokeless tobacco: Never Used  . Alcohol use 2.4 oz/week    4 Glasses of wine per week     Comment: socially  . Drug use: No  . Sexual activity: Not Asked   Other Topics Concern  . None   Social History Narrative   Lives in [place] with her husband. Exercises at the gym 4 times a week.     Family History: The patient's family history includes CVA in her mother; Coronary artery disease in her father; Heart disease in her father; Hypertension in her father. ROS:   Please see the history of present illness.     All other systems reviewed and are negative.  EKGs/Labs/Other Studies Reviewed:    The following studies were reviewed today:  EKG:  EKG is  ordered today.  The ekg ordered today demonstrates SR, otherwise normal.  Recent Labs: 05/06/2017: Hemoglobin 12.7; Platelets 168; TSH 2.770 05/13/2017: BUN 20; Creatinine, Ser 1.03; Potassium 4.6; Sodium 138  Recent Lipid Panel No results found for: CHOL, TRIG, HDL, CHOLHDL, VLDL, LDLCALC, LDLDIRECT  Physical Exam:    VS:  BP 126/68   Pulse 70   Ht 5' 3.5" (1.613 m)   Wt 215 lb (97.5 kg)    LMP  (LMP Unknown)   SpO2 97%   BMI 37.49 kg/m     Wt Readings from Last 3 Encounters:  06/03/17 215 lb (97.5 kg)  05/12/17 218 lb 9.6 oz (99.2 kg)  05/06/17 217 lb 3.2 oz (98.5 kg)     GEN:  Well nourished, well developed in no acute distress HEENT: Normal NECK: No JVD; No carotid bruits LYMPHATICS: No lymphadenopathy CARDIAC: RRR, no murmurs, rubs, gallops RESPIRATORY:  Clear to auscultation without rales, wheezing or rhonchi  ABDOMEN: Soft, non-tender, non-distended MUSCULOSKELETAL:  No edema;  No deformity  SKIN: Warm and dry NEUROLOGIC:  Alert and oriented x 3 PSYCHIATRIC:  Normal affect   TTE: 05/14/17 Left ventricle: The cavity size was normal. Wall thickness was   normal. Systolic function was normal. The estimated ejection   fraction was in the range of 60% to 65%. Wall motion was normal;   there were no regional wall motion abnormalities. Doppler   parameters are consistent with abnormal left ventricular   relaxation (grade 1 diastolic dysfunction). - Aortic valve: There was mild stenosis. Valve area (VTI): 3.19   cm^2. Valve area (Vmax): 2.84 cm^2. Valve area (Vmean): 2.62   cm^2. - Mitral valve: Moderately calcified annulus. There was mild   regurgitation. Valve area by continuity equation (using LVOT   flow): 6.98 cm^2. - Left atrium: The atrium was moderately dilated.    ASSESSMENT:    1. PAF (paroxysmal atrial fibrillation) ([place])   2. Essential hypertension   3. Mixed hyperlipidemia     PLAN:    In order of problems listed above:  1. Paroxysmal symptomatic afib Echo showed LVEF 60-65% with moderately dilated left atrium.  Continue cardizem 360 mg daily Continue xarelto 20 mg daily(started 7/12) for chadsvasc score of 3, no bleeding. Continue to refrain from alcohol and excessive caffeine Weight loss/exercise encouraged  2. HTN  Discontinue clonidine Increase losartan to 100 mg po daily  Medication Adjustments/Labs and Tests  Ordered: Current medicines are reviewed at length with the patient today.  Concerns regarding medicines are outlined above.  No orders of the defined types were placed in this encounter.  Meds ordered this encounter  Medications  . losartan (COZAAR) 100 MG tablet    Sig: Take 1 tablet (100 mg total) by mouth daily.    Dispense:  90 tablet    Refill:  3   Signed, [name], MD  06/06/2017 3:40 PM    [place]

## 2017-06-04 ENCOUNTER — Encounter: Payer: Self-pay | Admitting: Cardiology

## 2017-06-14 DIAGNOSIS — S42212D Unspecified displaced fracture of surgical neck of left humerus, subsequent encounter for fracture with routine healing: Secondary | ICD-10-CM | POA: Diagnosis not present

## 2017-06-21 DIAGNOSIS — S42212D Unspecified displaced fracture of surgical neck of left humerus, subsequent encounter for fracture with routine healing: Secondary | ICD-10-CM | POA: Diagnosis not present

## 2017-06-23 ENCOUNTER — Encounter: Payer: Self-pay | Admitting: Cardiology

## 2017-06-25 ENCOUNTER — Telehealth: Payer: Self-pay | Admitting: *Deleted

## 2017-06-25 ENCOUNTER — Other Ambulatory Visit: Payer: Self-pay | Admitting: *Deleted

## 2017-06-25 ENCOUNTER — Encounter: Payer: Self-pay | Admitting: Cardiology

## 2017-06-25 MED ORDER — DILTIAZEM HCL ER COATED BEADS 360 MG PO CP24: 360.0000 mg | ORAL_CAPSULE | Freq: Every day | ORAL | Status: DC

## 2017-06-25 NOTE — Telephone Encounter (Signed)
S/w pt is aware of Dr. Francesca Oman recommendation's to increase cardizem (360 mg ) daily. Pt stated has enough medication at home for this change does not need any thing sent to pharmacy.Medication list updated.

## 2017-06-26 ENCOUNTER — Encounter: Payer: Self-pay | Admitting: Cardiology

## 2017-06-29 MED ORDER — DILTIAZEM HCL ER COATED BEADS 240 MG PO CP24: 240.0000 mg | ORAL_CAPSULE | Freq: Every day | ORAL | 1 refills | Status: DC

## 2017-06-29 NOTE — Telephone Encounter (Signed)
Notified the pt through her active mychart account about decreasing her Cardizem CD 360 mg to 240 mg po daily.  As mentioned below, is Dr Francesca Oman response to the pts email about decreasing this med:  Yes, return to 240 mg po daily and let us know how are your BPs and HRs.  Sent the pt a message through her mychart account, to stop taking her Cardizem CD 360 mg po daily, and start taking Cardizem CD 240 mg po daily.  Informed the pt that I will make this change in her med list.  Advised the pt to call or email [place] back with how her BPs and HRs are with this switch.

## 2017-07-05 DIAGNOSIS — S42212D Unspecified displaced fracture of surgical neck of left humerus, subsequent encounter for fracture with routine healing: Secondary | ICD-10-CM | POA: Diagnosis not present

## 2017-07-10 DIAGNOSIS — Z23 Encounter for immunization: Secondary | ICD-10-CM | POA: Diagnosis not present

## 2017-07-11 ENCOUNTER — Encounter: Payer: Self-pay | Admitting: Cardiology

## 2017-07-12 DIAGNOSIS — S42202D Unspecified fracture of upper end of left humerus, subsequent encounter for fracture with routine healing: Secondary | ICD-10-CM | POA: Diagnosis not present

## 2017-07-13 ENCOUNTER — Encounter (HOSPITAL_BASED_OUTPATIENT_CLINIC_OR_DEPARTMENT_OTHER): Payer: Medicare Other

## 2017-07-14 DIAGNOSIS — S42202D Unspecified fracture of upper end of left humerus, subsequent encounter for fracture with routine healing: Secondary | ICD-10-CM | POA: Diagnosis not present

## 2017-07-19 NOTE — Telephone Encounter (Signed)
She can try to go back to Cardizem 360 mg po daily and see how she feels with that, she probably still has them at home.  Sent this message to the pt in mychart as an email and advised her to let us know if she wants to proceed with this recommendation so this can be updated in her chart.

## 2017-07-20 DIAGNOSIS — S42202D Unspecified fracture of upper end of left humerus, subsequent encounter for fracture with routine healing: Secondary | ICD-10-CM | POA: Diagnosis not present

## 2017-07-23 DIAGNOSIS — S42202D Unspecified fracture of upper end of left humerus, subsequent encounter for fracture with routine healing: Secondary | ICD-10-CM | POA: Diagnosis not present

## 2017-07-27 DIAGNOSIS — S42202D Unspecified fracture of upper end of left humerus, subsequent encounter for fracture with routine healing: Secondary | ICD-10-CM | POA: Diagnosis not present

## 2017-07-28 ENCOUNTER — Ambulatory Visit: Payer: Medicare Other | Admitting: Cardiology

## 2017-07-29 DIAGNOSIS — S42202D Unspecified fracture of upper end of left humerus, subsequent encounter for fracture with routine healing: Secondary | ICD-10-CM | POA: Diagnosis not present

## 2017-08-02 DIAGNOSIS — S42212D Unspecified displaced fracture of surgical neck of left humerus, subsequent encounter for fracture with routine healing: Secondary | ICD-10-CM | POA: Diagnosis not present

## 2017-08-03 DIAGNOSIS — S42202D Unspecified fracture of upper end of left humerus, subsequent encounter for fracture with routine healing: Secondary | ICD-10-CM | POA: Diagnosis not present

## 2017-08-05 DIAGNOSIS — S42202D Unspecified fracture of upper end of left humerus, subsequent encounter for fracture with routine healing: Secondary | ICD-10-CM | POA: Diagnosis not present

## 2017-08-12 DIAGNOSIS — S42202D Unspecified fracture of upper end of left humerus, subsequent encounter for fracture with routine healing: Secondary | ICD-10-CM | POA: Diagnosis not present

## 2017-08-19 ENCOUNTER — Encounter: Payer: Self-pay | Admitting: Cardiology

## 2017-08-19 ENCOUNTER — Ambulatory Visit (INDEPENDENT_AMBULATORY_CARE_PROVIDER_SITE_OTHER): Payer: Medicare Other | Admitting: Cardiology

## 2017-08-19 VITALS — BP 120/62 | HR 79 | Ht 63.5 in | Wt 218.0 lb

## 2017-08-19 DIAGNOSIS — I10 Essential (primary) hypertension: Secondary | ICD-10-CM

## 2017-08-19 DIAGNOSIS — I480 Paroxysmal atrial fibrillation: Secondary | ICD-10-CM

## 2017-08-19 DIAGNOSIS — Z7901 Long term (current) use of anticoagulants: Secondary | ICD-10-CM

## 2017-08-19 MED ORDER — HYDROCHLOROTHIAZIDE 25 MG PO TABS: 25.0000 mg | ORAL_TABLET | Freq: Every day | ORAL | 2 refills | Status: DC

## 2017-08-19 MED ORDER — ROSUVASTATIN CALCIUM 10 MG PO TABS: 10.0000 mg | ORAL_TABLET | Freq: Every day | ORAL | 2 refills | Status: DC

## 2017-08-19 MED ORDER — DILTIAZEM HCL ER COATED BEADS 240 MG PO CP24: 240.0000 mg | ORAL_CAPSULE | Freq: Every day | ORAL | 2 refills | Status: DC

## 2017-08-19 MED ORDER — OLMESARTAN MEDOXOMIL 40 MG PO TABS: 40.0000 mg | ORAL_TABLET | Freq: Every day | ORAL | 2 refills | Status: DC

## 2017-08-19 MED ORDER — RIVAROXABAN 20 MG PO TABS: 20.0000 mg | ORAL_TABLET | Freq: Every day | ORAL | 3 refills | Status: DC

## 2017-08-19 NOTE — Patient Instructions (Signed)

## 2017-08-19 NOTE — Progress Notes (Signed)
Cardiology Office Note:    Date:  06/03/17  ID:  Elizabeth Wang, DOB 1946/10/23, MRN [id number]  PCP:  [name], MD  Cardiologist:  [name], MD   Referring MD: [name], MD   Chief complain:   History of Present Illness:    Elizabeth Wang is a 71 y.o. female with a hx of paroxysmal atrial fibrillation (1 episode in the setting of acute distress and 2014), hypertension, right leg DVT and PE in 06/2013 (provoked by travel and dehydration) .   The patient's episode of atrial fibrillation occurred during a time of acute stress is much more than her usual. She was symptomatic with this and has not had any recurrence since. It is thought that she would beable to detect recurrence. She was continued on diltiazem for control. She was seen by hematologist at [facility] who felt that her risk of CVA was low and she could come off of her Xarelto. She was placed on 162 mg of aspirin daily. The plan was that if she develops atrial fibrillation again that we would encourage anticoagulation.  The patient had an echocardiogram in 06/2013 that showed normal LVEF, trivial AR, moderately to severely calcified mitral annulus.  She was seen in the afib clinic for evaluation for continuation of afib symptoms. Kardia strips are reviewed showing evidence of paroxysmal afib. She is now back on xarelto for a chadsvasc score of at least 3.  Today the patient states that she had an episode of A. Fib with RVR on the way here when she was stressed out, she had another episode yesterday. She has noticed that her episodes come after times when they have friends over and they have more alcohol when she is more stressed. She states that those episodes are not overly symptomatic and they're not frequent enough that she would consider ablation yet. Other than occasional bruising she hasn't noticed any bleeding with Xarelto.  Today, she denies symptoms of palpitations, chest pain, shortness of breath, orthopnea,  PND, lower extremity edema, dizziness, presyncope, syncope, or neurologic sequela. The patient is tolerating medications without difficulties and is otherwise without complaint today.  08/19/2017 - the patient is coming after 1 months, she has been checking her blood pressure at home that is sometimes in the 595-638 range systolic however she didn't want to increase Cardizem as it made her too hypotensive and dizzy. When measuring her blood pressure here today her blood pressure is 756 systolic. She cut down on call significantly and she tries to hydrate well. She has occasional palpitations they are not associated with chest pain or shortness of breath. She overall feels well she's not interested in ablation as her A. fib is asymptomatic. She is part of weight watcher group but currently doesn't exercise.  Past Medical History:  Diagnosis Date  . Acute pulmonary embolism ([place]) 06/2013  . Arthritis   . Asthma    years ago   . Clotting disorder (HCC)    DVT/PE  . Diverticulosis    minor   . Dyslipidemia   . Esophageal reflux   . Hypertension   . Hypothyroidism   . Numbness    TOES  . Obesity   . Paroxysmal atrial fibrillation (HCC)    1 isolated episode in the setting of acute stress with no further episodes.   . Pseudogout   . Sleep apnea    "not bad enough to use c- pap"    Past Surgical History:  Procedure Laterality Date  . BREAST  RECONSTRUCTION    . CESAREAN SECTION    . COLONOSCOPY  2004   repeat in ten years  . KNEE ARTHROSCOPY Left   . TONSILLECTOMY    . 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 TOTAL KNEE ARTHROPLASTY Left 07/23/2014   Procedure: LEFT TOTAL KNEE ARTHROPLASTY;  Surgeon: [name], MD;  Location: [facility];  Service: Orthopedics;  Laterality: Left;  . tummy tuck  1987  . WISDOM TOOTH EXTRACTION      Current  Medications: Current Meds  Medication Sig  . colchicine 0.6 MG tablet Take 0.6 mg by mouth 2 (two) times daily. For gout  . diclofenac (VOLTAREN) 75 MG EC tablet Take 75 mg by mouth daily.   [name] diltiazem (CARDIZEM CD) 240 MG 24 hr capsule Take 1 capsule (240 mg total) by mouth daily.  [name] EPINEPHrine (EPIPEN) 0.3 mg/0.3 mL SOAJ injection Inject 0.3 mg into the muscle as needed (Allergic Reaction).   . gabapentin (NEURONTIN) 300 MG capsule Take 300 mg by mouth at bedtime.  . hydrochlorothiazide (HYDRODIURIL) 25 MG tablet Take 1 tablet (25 mg total) by mouth daily.  [name] levothyroxine (SYNTHROID, LEVOTHROID) 125 MCG tablet Take 125 mcg by mouth daily before breakfast. For thyroid therapy  . olmesartan (BENICAR) 40 MG tablet Take 1 tablet (40 mg total) by mouth daily.  . rivaroxaban (XARELTO) 20 MG TABS tablet Take 1 tablet (20 mg total) by mouth daily with supper.  . rosuvastatin (CRESTOR) 10 MG tablet Take 1 tablet (10 mg total) by mouth daily.  [name] zolpidem (AMBIEN) 5 MG tablet Take 5 mg by mouth at bedtime as needed for sleep.  . [DISCONTINUED] diltiazem (CARDIZEM CD) 240 MG 24 hr capsule Take 1 capsule (240 mg total) by mouth daily.  . [DISCONTINUED] hydrochlorothiazide (HYDRODIURIL) 25 MG tablet Take 1 tablet (25 mg total) by mouth daily.  . [DISCONTINUED] olmesartan (BENICAR) 40 MG tablet Take 40 mg by mouth daily.  . [DISCONTINUED] rivaroxaban (XARELTO) 20 MG TABS tablet Take 1 tablet (20 mg total) by mouth daily with supper.  . [DISCONTINUED] rosuvastatin (CRESTOR) 10 MG tablet Take 10 mg by mouth daily.     Allergies:   Nsaids and Codeine   Social History   Social History  . Marital status: Married    Spouse name: N/A  . Number of children: N/A  . Years of education: N/A   Occupational History  . Financial Advisor    Social History Main Topics  . Smoking status: Former Smoker    Packs/day: 0.25    Years: 12.00    Quit date: 12/28/1975  . Smokeless tobacco: Never Used  . Alcohol  use 2.4 oz/week    4 Glasses of wine per week     Comment: socially  . Drug use: No  . Sexual activity: Not Asked   Other Topics Concern  . None   Social History Narrative   Lives in [place] with her husband. Exercises at the gym 4 times a week.     Family History: The patient's family history includes CVA in her mother; Coronary artery disease in her father; Heart disease in her father; Hypertension in her father. ROS:   Please see the history of present illness.     All other systems reviewed and are negative.  EKGs/Labs/Other Studies Reviewed:    The following studies were reviewed today:  EKG:  EKG is  ordered today.  The ekg ordered today demonstrates SR, otherwise normal.  Recent Labs: 05/06/2017: Hemoglobin 12.7; Platelets 168; TSH 2.770 05/13/2017: BUN 20; Creatinine, Ser 1.03; Potassium 4.6; Sodium 138  Recent Lipid Panel No results found for: CHOL, TRIG, HDL, CHOLHDL, VLDL, LDLCALC, LDLDIRECT  Physical Exam:    VS:  BP 120/62   Pulse 79   Ht 5' 3.5" (1.613 m)   Wt 218 lb (98.9 kg)   LMP  (LMP Unknown)   BMI 38.01 kg/m     Wt Readings from Last 3 Encounters:  08/19/17 218 lb (98.9 kg)  06/03/17 215 lb (97.5 kg)  05/12/17 218 lb 9.6 oz (99.2 kg)     GEN:  Well nourished, well developed in no acute distress HEENT: Normal NECK: No JVD; No carotid bruits LYMPHATICS: No lymphadenopathy CARDIAC: RRR, no murmurs, rubs, gallops RESPIRATORY:  Clear to auscultation without rales, wheezing or rhonchi  ABDOMEN: Soft, non-tender, non-distended MUSCULOSKELETAL:  No edema; No deformity  SKIN: Warm and dry NEUROLOGIC:  Alert and oriented x 3 PSYCHIATRIC:  Normal affect   TTE: 05/14/17 Left ventricle: The cavity size was normal. Wall thickness was   normal. Systolic function was normal. The estimated ejection   fraction was in the range of 60% to 65%. Wall motion was normal;   there were no regional wall motion abnormalities. Doppler   parameters are consistent  with abnormal left ventricular   relaxation (grade 1 diastolic dysfunction). - Aortic valve: There was mild stenosis. Valve area (VTI): 3.19   cm^2. Valve area (Vmax): 2.84 cm^2. Valve area (Vmean): 2.62   cm^2. - Mitral valve: Moderately calcified annulus. There was mild   regurgitation. Valve area by continuity equation (using LVOT   flow): 6.98 cm^2. - Left atrium: The atrium was moderately dilated.    ASSESSMENT:    1. PAF (paroxysmal atrial fibrillation) ([place])   2. Chronic anticoagulation   3. Essential hypertension     PLAN:    In order of problems listed above:  1. Paroxysmal symptomatic afib Echo showed LVEF 60-65% with moderately dilated left atrium.  Continue cardizem 240 mg daily Continue xarelto 20 mg daily(started 7/12) for chadsvasc score of 3, no bleeding. Continue to refrain from alcohol and excessive caffeine Weight loss/exercise encouraged, she is very motivated and is going to join water aerobic group. She's also part of weight watchers and motivated to lose more weight, she has previously been almost 300 pounds currently 218.  2. HTN  Well controlled on Cardizem 240 mg daily and almost certain 40 mg daily.  Medication Adjustments/Labs and Tests Ordered: Current medicines are reviewed at length with the patient today.  Concerns regarding medicines are outlined above.  No orders of the defined types were placed in this encounter.  Meds ordered this encounter  Medications  . rosuvastatin (CRESTOR) 10 MG tablet    Sig: Take 1 tablet (10 mg total) by mouth daily.    Dispense:  90 tablet    Refill:  2  . rivaroxaban (XARELTO) 20 MG TABS tablet    Sig: Take 1 tablet (20 mg total) by mouth daily with supper.    Dispense:  90 tablet    Refill:  3  . olmesartan (BENICAR) 40 MG tablet    Sig: Take 1 tablet (40 mg total) by mouth daily.    Dispense:  90 tablet    Refill:  2  . hydrochlorothiazide (HYDRODIURIL) 25 MG tablet    Sig: Take 1  tablet (25 mg  total) by mouth daily.    Dispense:  90 tablet    Refill:  2  . diltiazem (CARDIZEM CD) 240 MG 24 hr capsule    Sig: Take 1 capsule (240 mg total) by mouth daily.    Dispense:  90 capsule    Refill:  2   Signed, [name], MD  08/19/2017 9:12 AM    [place]

## 2017-08-23 DIAGNOSIS — S42212D Unspecified displaced fracture of surgical neck of left humerus, subsequent encounter for fracture with routine healing: Secondary | ICD-10-CM | POA: Diagnosis not present

## 2017-09-28 DIAGNOSIS — E669 Obesity, unspecified: Secondary | ICD-10-CM | POA: Diagnosis not present

## 2017-09-28 DIAGNOSIS — Z79899 Other long term (current) drug therapy: Secondary | ICD-10-CM | POA: Diagnosis not present

## 2017-09-28 DIAGNOSIS — M2550 Pain in unspecified joint: Secondary | ICD-10-CM | POA: Diagnosis not present

## 2017-09-28 DIAGNOSIS — M150 Primary generalized (osteo)arthritis: Secondary | ICD-10-CM | POA: Diagnosis not present

## 2017-09-28 DIAGNOSIS — M1120 Other chondrocalcinosis, unspecified site: Secondary | ICD-10-CM | POA: Diagnosis not present

## 2017-09-28 DIAGNOSIS — Z6838 Body mass index (BMI) 38.0-38.9, adult: Secondary | ICD-10-CM | POA: Diagnosis not present

## 2017-11-24 DIAGNOSIS — M19011 Primary osteoarthritis, right shoulder: Secondary | ICD-10-CM | POA: Insufficient documentation

## 2017-11-24 DIAGNOSIS — M25511 Pain in right shoulder: Secondary | ICD-10-CM | POA: Diagnosis not present

## 2018-01-13 ENCOUNTER — Encounter: Payer: Self-pay | Admitting: Cardiology

## 2018-01-13 ENCOUNTER — Ambulatory Visit (INDEPENDENT_AMBULATORY_CARE_PROVIDER_SITE_OTHER): Payer: Medicare Other | Admitting: Cardiology

## 2018-01-13 VITALS — BP 124/80 | HR 76 | Ht 63.5 in | Wt 225.6 lb

## 2018-01-13 DIAGNOSIS — I480 Paroxysmal atrial fibrillation: Secondary | ICD-10-CM | POA: Diagnosis not present

## 2018-01-13 DIAGNOSIS — I10 Essential (primary) hypertension: Secondary | ICD-10-CM

## 2018-01-13 DIAGNOSIS — E782 Mixed hyperlipidemia: Secondary | ICD-10-CM

## 2018-01-13 MED ORDER — DILTIAZEM HCL ER COATED BEADS 240 MG PO CP24: 240.0000 mg | ORAL_CAPSULE | Freq: Every day | ORAL | 2 refills | Status: DC

## 2018-01-13 MED ORDER — OLMESARTAN MEDOXOMIL 40 MG PO TABS: 40.0000 mg | ORAL_TABLET | Freq: Every day | ORAL | 2 refills | Status: DC

## 2018-01-13 MED ORDER — HYDROCHLOROTHIAZIDE 25 MG PO TABS: 25.0000 mg | ORAL_TABLET | Freq: Every day | ORAL | 2 refills | Status: DC

## 2018-01-13 MED ORDER — ROSUVASTATIN CALCIUM 10 MG PO TABS: 10.0000 mg | ORAL_TABLET | Freq: Every day | ORAL | 2 refills | Status: DC

## 2018-01-13 NOTE — Progress Notes (Signed)
Cardiology Office Note:    Date:  06/03/17  ID:  Elizabeth Wang, DOB 07/10/1946, MRN [id number]  PCP:  [name], MD  Cardiologist:  [name], MD   Referring MD: [name], MD   Chief complain:   History of Present Illness:    Elizabeth Wang is a 72 y.o. female with a hx of paroxysmal atrial fibrillation (1 episode in the setting of acute distress and 2014), hypertension, right leg DVT and PE in 06/2013 (provoked by travel and dehydration) .   The patient's episode of atrial fibrillation occurred during a time of acute stress is much more than her usual. She was symptomatic with this and has not had any recurrence since. It is thought that she would beable to detect recurrence. She was continued on diltiazem for control. She was seen by hematologist at [facility] who felt that her risk of CVA was low and she could come off of her Xarelto. She was placed on 162 mg of aspirin daily. The plan was that if she develops atrial fibrillation again that we would encourage anticoagulation.  The patient had an echocardiogram in 06/2013 that showed normal LVEF, trivial AR, moderately to severely calcified mitral annulus.  She was seen in the afib clinic for evaluation for continuation of afib symptoms. Kardia strips are reviewed showing evidence of paroxysmal afib. She is now back on xarelto for a chadsvasc score of at least 3.  Today the patient states that she had an episode of A. Fib with RVR on the way here when she was stressed out, she had another episode yesterday. She has noticed that her episodes come after times when they have friends over and they have more alcohol when she is more stressed. She states that those episodes are not overly symptomatic and they're not frequent enough that she would consider ablation yet. Other than occasional bruising she hasn't noticed any bleeding with Xarelto.  Today, she denies symptoms of palpitations, chest pain, shortness of breath, orthopnea,  PND, lower extremity edema, dizziness, presyncope, syncope, or neurologic sequela. The patient is tolerating medications without difficulties and is otherwise without complaint today.  08/19/2017 - the patient is coming after 1 months, she has been checking her blood pressure at home that is sometimes in the 073-710 range systolic however she didn't want to increase Cardizem as it made her too hypotensive and dizzy. When measuring her blood pressure here today her blood pressure is 626 systolic. She cut down on call significantly and she tries to hydrate well. She has occasional palpitations they are not associated with chest pain or shortness of breath. She overall feels well she's not interested in ablation as her A. fib is asymptomatic. She is part of weight watcher group but currently doesn't exercise.  01/13/2018 - the patient is coming after 5 months, she has been doing great, she swims in exercises water aerobics 5 times a week, she's been compliant with her medications, she has no bleeding with Xarelto, no muscle pain with Crestor. She denies any recent palpitation or dizziness or syncope. Her only struggle is her weight, she lost from 287 pounds to 220 pounds she is currently enrolled in Weight Watchers but struggles to get below 220.  Past Medical History:  Diagnosis Date  . Acute pulmonary embolism ([place]) 06/2013  . Arthritis   . Asthma    years ago   . Clotting disorder (HCC)    DVT/PE  . Diverticulosis    minor   . Dyslipidemia   .  Esophageal reflux   . Hypertension   . Hypothyroidism   . Numbness    TOES  . Obesity   . Paroxysmal atrial fibrillation (HCC)    1 isolated episode in the setting of acute stress with no further episodes.   . Pseudogout   . Sleep apnea    "not bad enough to use c- pap"    Past Surgical History:  Procedure Laterality Date  . BREAST RECONSTRUCTION    . CESAREAN SECTION    . COLONOSCOPY  2004   repeat in ten years  . KNEE ARTHROSCOPY Left   .  TONSILLECTOMY    . 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 TOTAL KNEE ARTHROPLASTY Left 07/23/2014   Procedure: LEFT TOTAL KNEE ARTHROPLASTY;  Surgeon: [name], MD;  Location: [facility];  Service: Orthopedics;  Laterality: Left;  . tummy tuck  1987  . WISDOM TOOTH EXTRACTION      Current Medications: Current Meds  Medication Sig  . colchicine 0.6 MG tablet Take 0.6 mg by mouth 2 (two) times daily. For gout  . diclofenac (VOLTAREN) 75 MG EC tablet Take 75 mg by mouth daily.   [name] EPINEPHrine (EPIPEN) 0.3 mg/0.3 mL SOAJ injection Inject 0.3 mg into the muscle as needed (Allergic Reaction).   . gabapentin (NEURONTIN) 300 MG capsule Take 300 mg by mouth at bedtime.  [name] levothyroxine (SYNTHROID, LEVOTHROID) 125 MCG tablet Take 125 mcg by mouth daily before breakfast. For thyroid therapy  . olmesartan (BENICAR) 40 MG tablet Take 1 tablet (40 mg total) by mouth daily.  . rivaroxaban (XARELTO) 20 MG TABS tablet Take 1 tablet (20 mg total) by mouth daily with supper.  . rosuvastatin (CRESTOR) 10 MG tablet Take 1 tablet (10 mg total) by mouth daily.  [name] zolpidem (AMBIEN) 5 MG tablet Take 5 mg by mouth at bedtime as needed for sleep.  . [DISCONTINUED] olmesartan (BENICAR) 40 MG tablet Take 1 tablet (40 mg total) by mouth daily.  . [DISCONTINUED] rosuvastatin (CRESTOR) 10 MG tablet Take 1 tablet (10 mg total) by mouth daily.     Allergies:   Nsaids and Codeine   Social History   Socioeconomic History  . Marital status: Married    Spouse name: Not on file  . Number of children: Not on file  . Years of education: Not on file  . Highest education level: Not on file  Occupational History  . Occupation: Radiation protection practitioner Needs  . Financial resource strain: Not on file  . Food insecurity:    Worry: Not on file    Inability: Not on file  .  Transportation needs:    Medical: Not on file    Non-medical: Not on file  Tobacco Use  . Smoking status: Former Smoker    Packs/day: 0.25    Years: 12.00    Pack years: 3.00    Last attempt to quit: 12/28/1975    Years since quitting: 42.0  . Smokeless tobacco: Never Used  Substance and Sexual Activity  . Alcohol use: Yes    Alcohol/week: 2.4 oz    Types: 4 Glasses of wine per week    Comment: socially  . Drug use: No  . Sexual activity: Not on file  Lifestyle  . Physical activity:    Days per week: Not on file    Minutes per session:  Not on file  . Stress: Not on file  Relationships  . Social connections:    Talks on phone: Not on file    Gets together: Not on file    Attends religious service: Not on file    Active member of club or organization: Not on file    Attends meetings of clubs or organizations: Not on file    Relationship status: Not on file  Other Topics Concern  . Not on file  Social History Narrative   Lives in [place] with her husband. Exercises at the gym 4 times a week.     Family History: The patient's family history includes CVA in her mother; Coronary artery disease in her father; Heart disease in her father; Hypertension in her father. ROS:   Please see the history of present illness.     All other systems reviewed and are negative.  EKGs/Labs/Other Studies Reviewed:    The following studies were reviewed today:  EKG:  EKG is  ordered today.  The ekg ordered today demonstrates SR, otherwise normal.  Recent Labs: 05/06/2017: Hemoglobin 12.7; Platelets 168; TSH 2.770 05/13/2017: BUN 20; Creatinine, Ser 1.03; Potassium 4.6; Sodium 138  Recent Lipid Panel No results found for: CHOL, TRIG, HDL, CHOLHDL, VLDL, LDLCALC, LDLDIRECT  Physical Exam:    VS:  BP 124/80 (BP Location: Left Arm, Patient Position: Sitting, Cuff Size: Large)   Pulse 76   Ht 5' 3.5" (1.613 m)   Wt 225 lb 9.6 oz (102.3 kg)   LMP  (LMP Unknown)   SpO2 98%   BMI 39.34  kg/m     Wt Readings from Last 3 Encounters:  01/13/18 225 lb 9.6 oz (102.3 kg)  08/19/17 218 lb (98.9 kg)  06/03/17 215 lb (97.5 kg)     GEN:  Well nourished, well developed in no acute distress HEENT: Normal NECK: No JVD; No carotid bruits LYMPHATICS: No lymphadenopathy CARDIAC: RRR, no murmurs, rubs, gallops RESPIRATORY:  Clear to auscultation without rales, wheezing or rhonchi  ABDOMEN: Soft, non-tender, non-distended MUSCULOSKELETAL:  No edema; No deformity  SKIN: Warm and dry NEUROLOGIC:  Alert and oriented x 3 PSYCHIATRIC:  Normal affect   TTE: 05/14/17 Left ventricle: The cavity size was normal. Wall thickness was   normal. Systolic function was normal. The estimated ejection   fraction was in the range of 60% to 65%. Wall motion was normal;   there were no regional wall motion abnormalities. Doppler   parameters are consistent with abnormal left ventricular   relaxation (grade 1 diastolic dysfunction). - Aortic valve: There was mild stenosis. Valve area (VTI): 3.19   cm^2. Valve area (Vmax): 2.84 cm^2. Valve area (Vmean): 2.62   cm^2. - Mitral valve: Moderately calcified annulus. There was mild   regurgitation. Valve area by continuity equation (using LVOT   flow): 6.98 cm^2. - Left atrium: The atrium was moderately dilated.  EKG 01/13/2018 shows sinus rhythm with first-degree AV block otherwise normal EKG unchanged from prior.  ASSESSMENT:    1. Essential hypertension   2. Mixed hyperlipidemia   3. PAF (paroxysmal atrial fibrillation) (HCC)     PLAN:    In order of problems listed above:  1. Paroxysmal symptomatic afib Echo showed LVEF 60-65% with moderately dilated left atrium.  Continue cardizem 240 mg daily Continue xarelto 20 mg daily(started 7/12) for chadsvasc score of 3, no bleeding. Continue to refrain from alcohol and excessive caffeine Weight loss/exercise encouraged, she is very motivated and is going to join water  aerobic group. She's also  part of weight watchers and motivated to lose more weight, she has previously been almost 300 pounds currently 225. She is congratulated on exercise.  2. HTN  Well controlled on Cardizem 240 mg daily and Benicar 40 mg daily.Benicar is known to be on back order, we will potentially switch to lisinopril if needed.  3. Hyperlipidemia, most recent LDL 8 was 1:15, she was started on Crestor 10 mg daily, we will check calcium score today and adjust dose as needed.  Medication Adjustments/Labs and Tests Ordered: Current medicines are reviewed at length with the patient today.  Concerns regarding medicines are outlined above.  Orders Placed This Encounter  Procedures  . CT CARDIAC SCORING  . EKG 12-Lead   Meds ordered this encounter  Medications  . diltiazem (CARDIZEM CD) 240 MG 24 hr capsule    Sig: Take 1 capsule (240 mg total) by mouth daily.    Dispense:  90 capsule    Refill:  2  . hydrochlorothiazide (HYDRODIURIL) 25 MG tablet    Sig: Take 1 tablet (25 mg total) by mouth daily.    Dispense:  90 tablet    Refill:  2  . olmesartan (BENICAR) 40 MG tablet    Sig: Take 1 tablet (40 mg total) by mouth daily.    Dispense:  90 tablet    Refill:  2  . rosuvastatin (CRESTOR) 10 MG tablet    Sig: Take 1 tablet (10 mg total) by mouth daily.    Dispense:  90 tablet    Refill:  2   Signed, [name], MD  01/13/2018 9:58 AM    [place]

## 2018-01-13 NOTE — Patient Instructions (Signed)
Medication Instructions:   Your physician recommends that you continue on your current medications as directed. Please refer to the Current Medication list given to you today.    Testing/Procedures:  CARDIAC CALCIUM SCORE DONE TODAY IF POSSIBLE     Follow-Up:  Your physician wants you to follow-up in: [place] will receive a reminder letter in the mail two months in advance. If you don't receive a letter, please call our office to schedule the follow-up appointment.        If you need a refill on your cardiac medications before your next appointment, please call your pharmacy.

## 2018-01-14 ENCOUNTER — Telehealth: Payer: Self-pay | Admitting: Cardiology

## 2018-01-14 NOTE — Telephone Encounter (Signed)
New message  Pt c/o medication issue:  1. Name of Medication: olmesartan (BENICAR) 40 MG tablet  2. How are you currently taking this medication (dosage and times per day)? Take 1 tablet (40 mg total) by mouth daily  3. Are you having a reaction (difficulty breathing--STAT)? no  4. What is your medication issue? At [place] states the medication is on back order and pt pcp changed it to Telmesartin and pt wants to make sure its ok with Meda Coffee. Please call

## 2018-01-14 NOTE — Telephone Encounter (Signed)
Dr Meda Coffee, please review and advise. Pt wants to know if you want her to proceed with taking Telmisartan, as changed by her PCP, or do you want her to start taking lisinopril, as outlined in your OV.  Please advise.

## 2018-01-15 NOTE — Telephone Encounter (Signed)
Its Ok with me, that's our preferred medication

## 2018-01-17 NOTE — Telephone Encounter (Signed)
Spoke with the Pharmacist at [facility], and informed him that Dr Meda Coffee agreed with the pts PCP on prescribing the pt Telmisartan. Pharmacist verbalized understanding and agrees with this plan. Pharmacist will follow-up with the pt. Will take Benicar out of her med list.

## 2018-03-23 ENCOUNTER — Encounter: Payer: Self-pay | Admitting: Cardiology

## 2018-03-23 DIAGNOSIS — E038 Other specified hypothyroidism: Secondary | ICD-10-CM | POA: Diagnosis not present

## 2018-03-23 DIAGNOSIS — I10 Essential (primary) hypertension: Secondary | ICD-10-CM | POA: Diagnosis not present

## 2018-03-23 DIAGNOSIS — R82998 Other abnormal findings in urine: Secondary | ICD-10-CM | POA: Diagnosis not present

## 2018-03-29 DIAGNOSIS — M1120 Other chondrocalcinosis, unspecified site: Secondary | ICD-10-CM | POA: Diagnosis not present

## 2018-03-29 DIAGNOSIS — M2550 Pain in unspecified joint: Secondary | ICD-10-CM | POA: Diagnosis not present

## 2018-03-29 DIAGNOSIS — Z79899 Other long term (current) drug therapy: Secondary | ICD-10-CM | POA: Diagnosis not present

## 2018-03-29 DIAGNOSIS — M150 Primary generalized (osteo)arthritis: Secondary | ICD-10-CM | POA: Diagnosis not present

## 2018-03-29 DIAGNOSIS — Z6839 Body mass index (BMI) 39.0-39.9, adult: Secondary | ICD-10-CM | POA: Diagnosis not present

## 2018-03-29 DIAGNOSIS — E669 Obesity, unspecified: Secondary | ICD-10-CM | POA: Diagnosis not present

## 2018-03-30 DIAGNOSIS — I2510 Atherosclerotic heart disease of native coronary artery without angina pectoris: Secondary | ICD-10-CM | POA: Diagnosis not present

## 2018-03-30 DIAGNOSIS — E7849 Other hyperlipidemia: Secondary | ICD-10-CM | POA: Diagnosis not present

## 2018-03-30 DIAGNOSIS — R740 Nonspecific elevation of levels of transaminase and lactic acid dehydrogenase [LDH]: Secondary | ICD-10-CM | POA: Diagnosis not present

## 2018-03-30 DIAGNOSIS — Z6839 Body mass index (BMI) 39.0-39.9, adult: Secondary | ICD-10-CM | POA: Diagnosis not present

## 2018-03-30 DIAGNOSIS — Z0000 Encounter for general adult medical examination without abnormal findings: Secondary | ICD-10-CM | POA: Diagnosis not present

## 2018-03-30 DIAGNOSIS — Z1389 Encounter for screening for other disorder: Secondary | ICD-10-CM | POA: Diagnosis not present

## 2018-03-30 DIAGNOSIS — E038 Other specified hypothyroidism: Secondary | ICD-10-CM | POA: Diagnosis not present

## 2018-03-30 DIAGNOSIS — Z7901 Long term (current) use of anticoagulants: Secondary | ICD-10-CM | POA: Diagnosis not present

## 2018-03-30 DIAGNOSIS — I480 Paroxysmal atrial fibrillation: Secondary | ICD-10-CM | POA: Diagnosis not present

## 2018-03-30 DIAGNOSIS — I10 Essential (primary) hypertension: Secondary | ICD-10-CM | POA: Diagnosis not present

## 2018-03-30 DIAGNOSIS — N183 Chronic kidney disease, stage 3 (moderate): Secondary | ICD-10-CM | POA: Diagnosis not present

## 2018-04-06 DIAGNOSIS — Z1231 Encounter for screening mammogram for malignant neoplasm of breast: Secondary | ICD-10-CM | POA: Diagnosis not present

## 2018-04-13 DIAGNOSIS — H5203 Hypermetropia, bilateral: Secondary | ICD-10-CM | POA: Diagnosis not present

## 2018-04-13 DIAGNOSIS — H52223 Regular astigmatism, bilateral: Secondary | ICD-10-CM | POA: Diagnosis not present

## 2018-04-13 DIAGNOSIS — H2513 Age-related nuclear cataract, bilateral: Secondary | ICD-10-CM | POA: Diagnosis not present

## 2018-04-13 DIAGNOSIS — H524 Presbyopia: Secondary | ICD-10-CM | POA: Diagnosis not present

## 2018-04-18 IMAGING — MR MR FOOT*L* W/O CM
4 of 6 series · 25 of 40 positions shown · non-contrast
Comparison: None.

CLINICAL DATA: Left great toe ulcer.  Painful.

EXAM:
MRI OF THE LEFT FOOT WITHOUT CONTRAST
TECHNIQUE: Multiplanar, multisequence MR imaging of the left forefoot was
performed. No intravenous contrast was administered.

[Series 4: T1 · coronal · 4.0mm · 0.39mm/px · 5 of 30 slices shown]
[im 1/30]
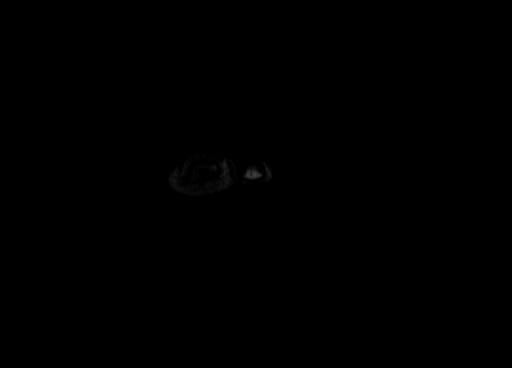
[im 4/30]
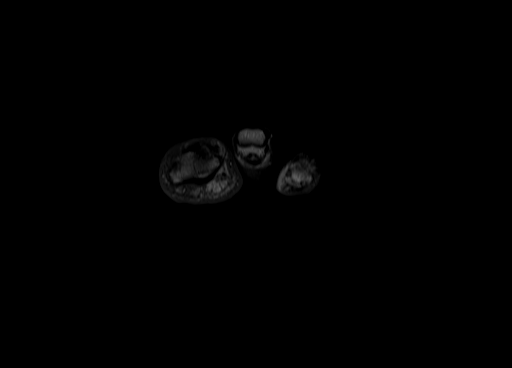
[im 10/30]
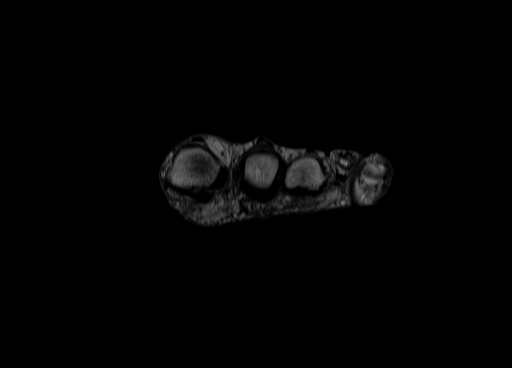
[im 17/30]
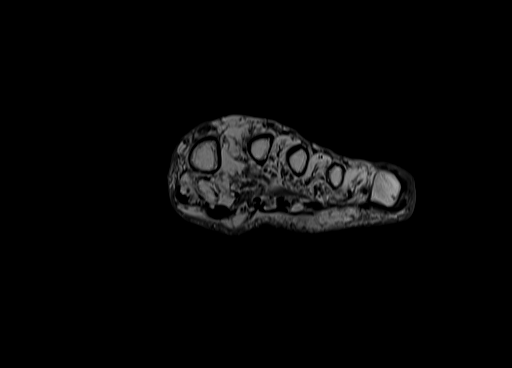
[im 26/30]
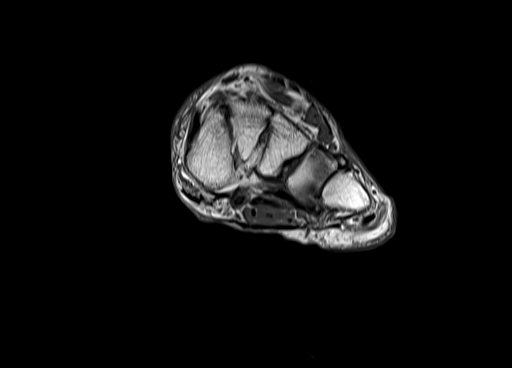

[Series 5: T2 fat-sat · coronal · 4.0mm · 0.39mm/px · 9 of 30 slices shown (1 of 3)]
[im 1/30]
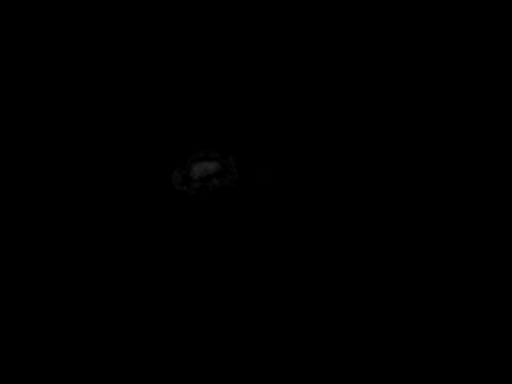
[im 4/30]
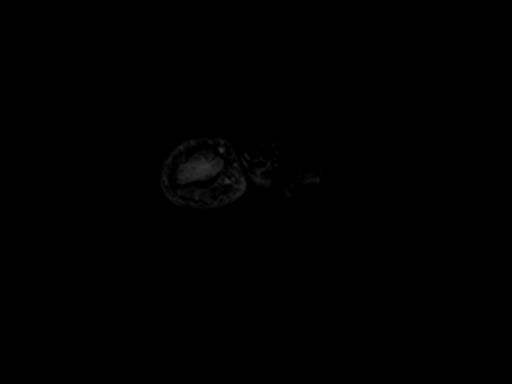
[im 8/30]
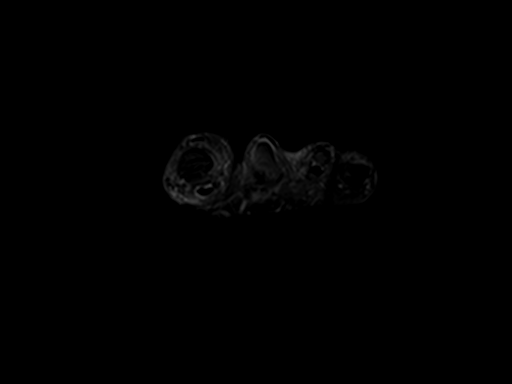
[im 11/30]
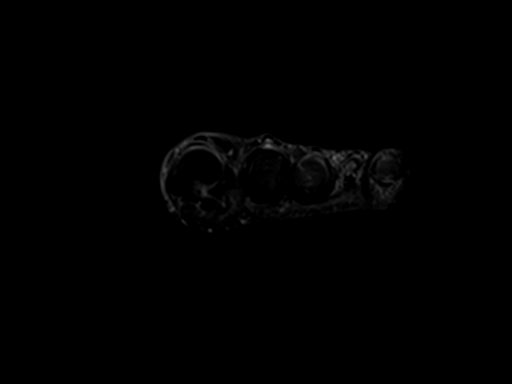
[im 15/30]
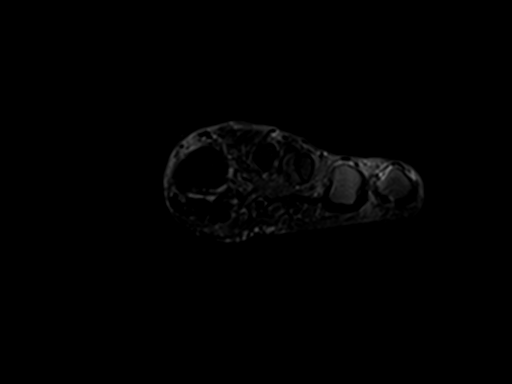
[im 19/30]
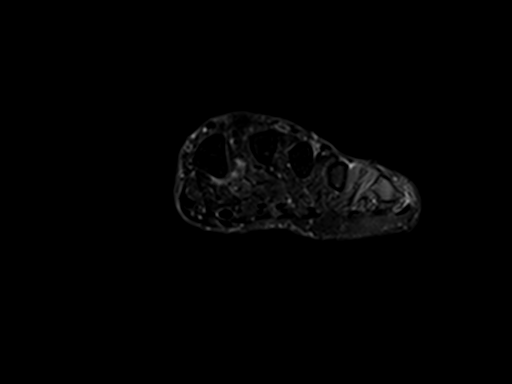
[im 22/30]
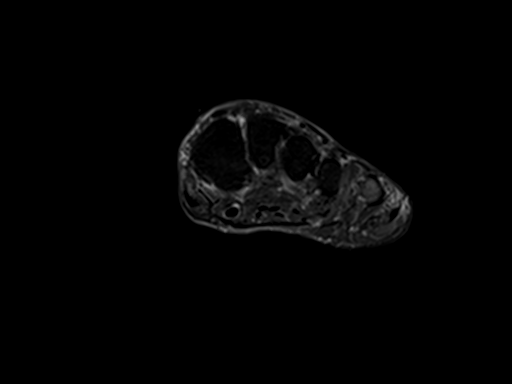
[im 26/30]
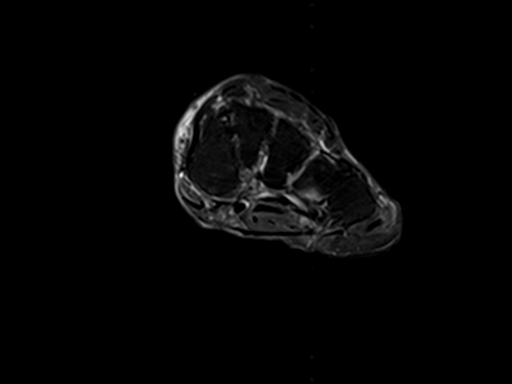
[im 30/30]
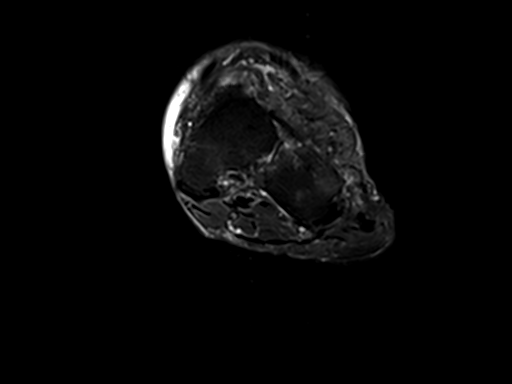

[Series 7: T2 fat-sat · axial · 4.0mm · 0.45mm/px · z∈[-47,+34]mm · 5 of 18 slices shown (2 of 3)]
[im 1/18]
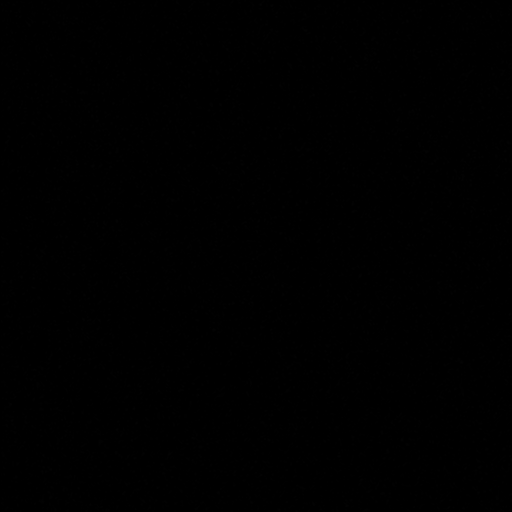
[im 5/18]
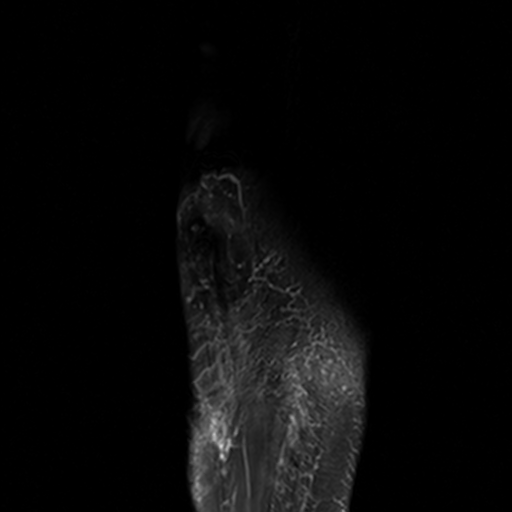
[im 9/18]
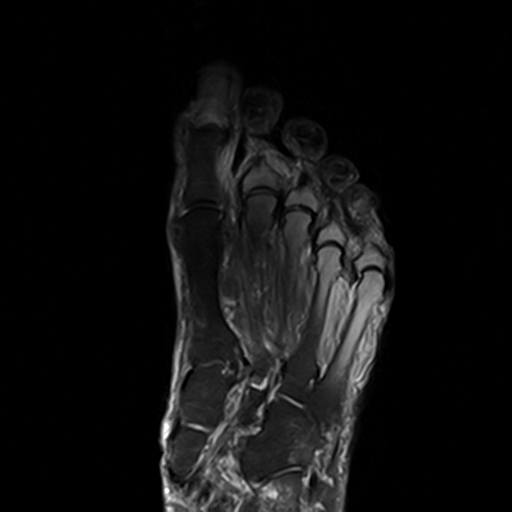
[im 13/18]
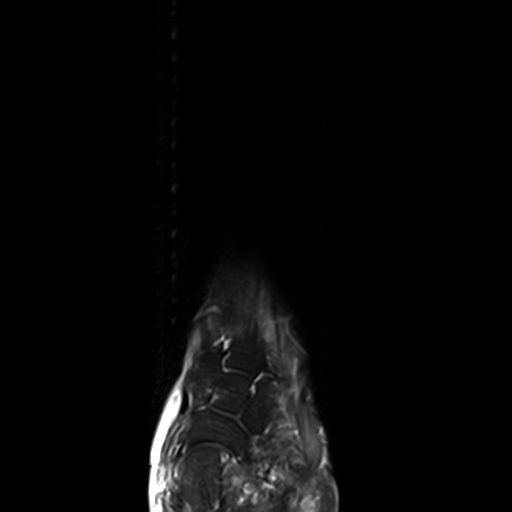
[im 18/18]
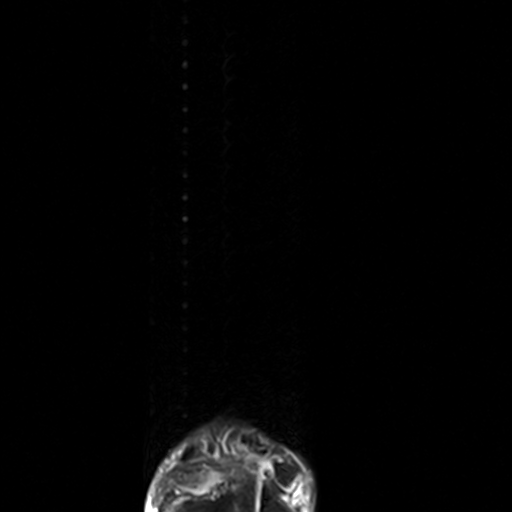

[Series 8: T2 fat-sat · sagittal · 4.0mm · 0.43mm/px · 6 of 21 slices shown (3 of 3)]
[im 1/21]
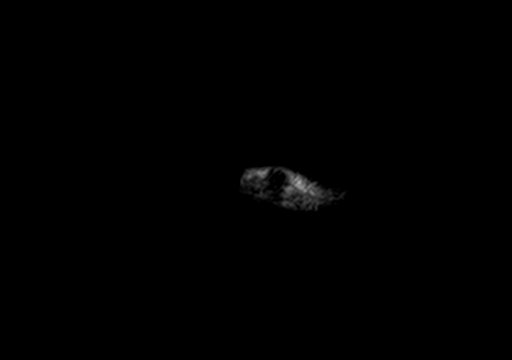
[im 5/21]
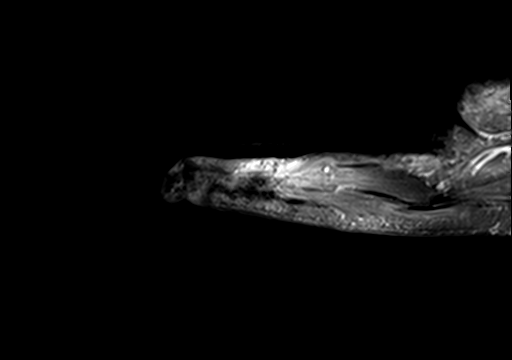
[im 9/21]
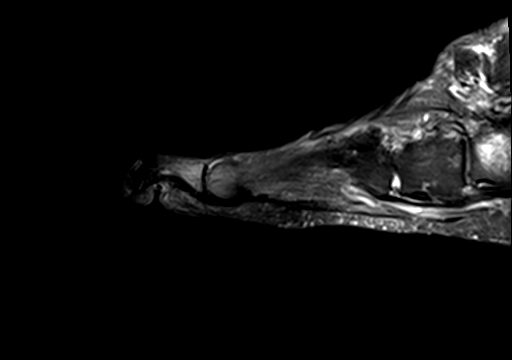
[im 13/21]
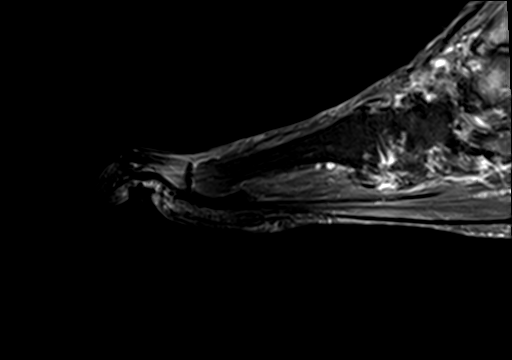
[im 17/21]
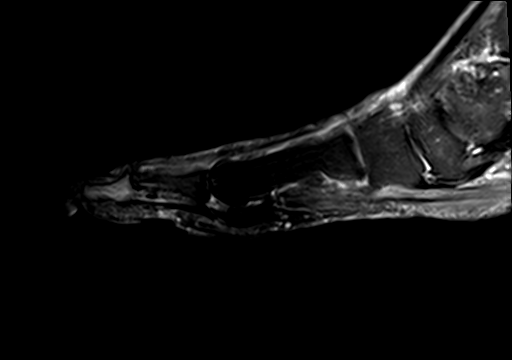
[im 21/21]
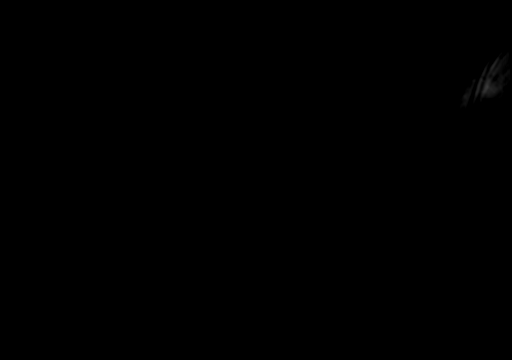

[25 of 40 positions shown; findings below may reference images not displayed]

FINDINGS: Bones/Joint/Cartilage

Soft tissue ulcer at the tip of the great toe. Severe underlying
marrow edema in the first distal phalanx with cortical irregularity
most concerning for osteomyelitis.

No other marrow signal abnormality. Mild osteoarthritis of the first
MTP joint. Normal alignment.

Ligaments

Intact collateral ligaments.  Intact Lisfranc ligament.

Muscles and Tendons

Normal muscle signal.  No muscle atrophy.

Soft tissues

No fluid collection or hematoma. Soft tissue edema circumferentially
in the distal aspect of the great toe.
IMPRESSION: 1. Soft tissue ulcer at the tip of the great toe with surrounding
cellulitis. Osteomyelitis of the first distal phalanx. No drainable
fluid collection to suggest an abscess.

## 2018-05-23 ENCOUNTER — Other Ambulatory Visit: Payer: Self-pay | Admitting: Cardiology

## 2018-07-15 DIAGNOSIS — Z23 Encounter for immunization: Secondary | ICD-10-CM | POA: Diagnosis not present

## 2018-07-21 DIAGNOSIS — L821 Other seborrheic keratosis: Secondary | ICD-10-CM | POA: Diagnosis not present

## 2018-07-21 DIAGNOSIS — D2239 Melanocytic nevi of other parts of face: Secondary | ICD-10-CM | POA: Diagnosis not present

## 2018-07-21 DIAGNOSIS — D2262 Melanocytic nevi of left upper limb, including shoulder: Secondary | ICD-10-CM | POA: Diagnosis not present

## 2018-07-21 DIAGNOSIS — D224 Melanocytic nevi of scalp and neck: Secondary | ICD-10-CM | POA: Diagnosis not present

## 2018-07-21 DIAGNOSIS — B078 Other viral warts: Secondary | ICD-10-CM | POA: Diagnosis not present

## 2018-07-21 DIAGNOSIS — D225 Melanocytic nevi of trunk: Secondary | ICD-10-CM | POA: Diagnosis not present

## 2018-07-21 DIAGNOSIS — D2261 Melanocytic nevi of right upper limb, including shoulder: Secondary | ICD-10-CM | POA: Diagnosis not present

## 2018-09-17 ENCOUNTER — Other Ambulatory Visit: Payer: Self-pay | Admitting: Cardiology

## 2018-09-19 NOTE — Telephone Encounter (Signed)
Xarelto 20mg  refill request received; pt is 72 yrs old, wt-102.3kg, Crea-1.23 on 03/29/18 via [facility], last seen by Dr. Meda Coffee on 01/13/18, CrCl-66.74ml/min; will send in refill to requested pharmacy.

## 2018-09-28 DIAGNOSIS — M1120 Other chondrocalcinosis, unspecified site: Secondary | ICD-10-CM | POA: Diagnosis not present

## 2018-09-28 DIAGNOSIS — Z6838 Body mass index (BMI) 38.0-38.9, adult: Secondary | ICD-10-CM | POA: Diagnosis not present

## 2018-09-28 DIAGNOSIS — M2550 Pain in unspecified joint: Secondary | ICD-10-CM | POA: Diagnosis not present

## 2018-09-28 DIAGNOSIS — E669 Obesity, unspecified: Secondary | ICD-10-CM | POA: Diagnosis not present

## 2018-09-28 DIAGNOSIS — M150 Primary generalized (osteo)arthritis: Secondary | ICD-10-CM | POA: Diagnosis not present

## 2018-09-28 DIAGNOSIS — Z79899 Other long term (current) drug therapy: Secondary | ICD-10-CM | POA: Diagnosis not present

## 2018-09-29 DIAGNOSIS — H00022 Hordeolum internum right lower eyelid: Secondary | ICD-10-CM | POA: Diagnosis not present

## 2018-10-03 DIAGNOSIS — M5431 Sciatica, right side: Secondary | ICD-10-CM | POA: Diagnosis not present

## 2018-10-24 DIAGNOSIS — R05 Cough: Secondary | ICD-10-CM | POA: Diagnosis not present

## 2018-10-24 DIAGNOSIS — Z6839 Body mass index (BMI) 39.0-39.9, adult: Secondary | ICD-10-CM | POA: Diagnosis not present

## 2018-10-24 DIAGNOSIS — I10 Essential (primary) hypertension: Secondary | ICD-10-CM | POA: Diagnosis not present

## 2018-10-28 ENCOUNTER — Other Ambulatory Visit: Payer: Self-pay | Admitting: Cardiology

## 2018-10-28 DIAGNOSIS — I10 Essential (primary) hypertension: Secondary | ICD-10-CM

## 2018-10-28 DIAGNOSIS — E782 Mixed hyperlipidemia: Secondary | ICD-10-CM

## 2018-10-28 DIAGNOSIS — I480 Paroxysmal atrial fibrillation: Secondary | ICD-10-CM

## 2018-11-15 ENCOUNTER — Other Ambulatory Visit: Payer: Self-pay | Admitting: Cardiology

## 2018-11-15 DIAGNOSIS — I480 Paroxysmal atrial fibrillation: Secondary | ICD-10-CM

## 2018-11-15 DIAGNOSIS — I10 Essential (primary) hypertension: Secondary | ICD-10-CM

## 2018-11-15 DIAGNOSIS — E782 Mixed hyperlipidemia: Secondary | ICD-10-CM

## 2019-02-07 ENCOUNTER — Other Ambulatory Visit: Payer: Self-pay | Admitting: Cardiology

## 2019-02-07 DIAGNOSIS — E782 Mixed hyperlipidemia: Secondary | ICD-10-CM

## 2019-02-07 DIAGNOSIS — I10 Essential (primary) hypertension: Secondary | ICD-10-CM

## 2019-02-07 DIAGNOSIS — I480 Paroxysmal atrial fibrillation: Secondary | ICD-10-CM

## 2019-03-14 ENCOUNTER — Other Ambulatory Visit: Payer: Self-pay | Admitting: Cardiology

## 2019-03-14 DIAGNOSIS — I10 Essential (primary) hypertension: Secondary | ICD-10-CM

## 2019-03-14 DIAGNOSIS — E782 Mixed hyperlipidemia: Secondary | ICD-10-CM

## 2019-03-14 DIAGNOSIS — I480 Paroxysmal atrial fibrillation: Secondary | ICD-10-CM

## 2019-03-16 ENCOUNTER — Other Ambulatory Visit: Payer: Self-pay | Admitting: Cardiology

## 2019-03-16 DIAGNOSIS — I10 Essential (primary) hypertension: Secondary | ICD-10-CM

## 2019-03-16 DIAGNOSIS — E782 Mixed hyperlipidemia: Secondary | ICD-10-CM

## 2019-03-16 DIAGNOSIS — I480 Paroxysmal atrial fibrillation: Secondary | ICD-10-CM

## 2019-03-16 MED ORDER — DILTIAZEM HCL ER COATED BEADS 240 MG PO CP24: 240.0000 mg | ORAL_CAPSULE | Freq: Every day | ORAL | 0 refills | Status: DC

## 2019-03-16 MED ORDER — ROSUVASTATIN CALCIUM 10 MG PO TABS: 10.0000 mg | ORAL_TABLET | Freq: Every day | ORAL | 0 refills | Status: DC

## 2019-03-16 NOTE — Telephone Encounter (Signed)
Pt's medication was sent to pt's pharmacy as requested. Confirmation received.  °

## 2019-03-16 NOTE — Telephone Encounter (Signed)
°*  STAT* If patient is at the pharmacy, call can be transferred to refill team.   1. Which medications need to be refilled? (please list name of each medication and dose if known)  Diltiazem and Crestor- Pt made an appt for 03-23-19 with Ellen Henri  2. Which pharmacy/location (including street and city if local pharmacy) is medication to be sent to [facility]- [phone]  3. Do they need a 30 day or 90 day supply? 90 and refills-

## 2019-03-21 ENCOUNTER — Telehealth: Payer: Self-pay

## 2019-03-21 NOTE — Telephone Encounter (Signed)
Left patient a message to call us about her visit on 5/28.

## 2019-03-22 ENCOUNTER — Telehealth: Payer: Self-pay

## 2019-03-22 NOTE — Telephone Encounter (Signed)
   TELEPHONE CALL NOTE  This patient has been deemed a candidate for follow-up tele-health visit to limit community exposure during the Covid-19 pandemic. I spoke with the patient via phone to discuss instructions.  A Virtual Office Visit appointment type has been scheduled for 5/28 with Lyda Jester, PA, with "VIDEO" or "TELEPHONE" in the appointment notes - patient prefers Video type.   Frederik Schmidt, RN 03/22/2019 8:40 AM  Patient has consented to a Virtual Visit by Video with Lyda Jester, PA on 5/28 @ 10:30.[name]

## 2019-03-23 ENCOUNTER — Other Ambulatory Visit: Payer: Self-pay

## 2019-03-23 ENCOUNTER — Encounter: Payer: Self-pay | Admitting: Cardiology

## 2019-03-23 ENCOUNTER — Telehealth (INDEPENDENT_AMBULATORY_CARE_PROVIDER_SITE_OTHER): Payer: Medicare Other | Admitting: Cardiology

## 2019-03-23 VITALS — Ht 63.5 in | Wt 220.0 lb

## 2019-03-23 DIAGNOSIS — Z7901 Long term (current) use of anticoagulants: Secondary | ICD-10-CM

## 2019-03-23 DIAGNOSIS — I480 Paroxysmal atrial fibrillation: Secondary | ICD-10-CM

## 2019-03-23 DIAGNOSIS — E782 Mixed hyperlipidemia: Secondary | ICD-10-CM

## 2019-03-23 DIAGNOSIS — I10 Essential (primary) hypertension: Secondary | ICD-10-CM

## 2019-03-23 NOTE — Progress Notes (Signed)
Virtual Visit via Video Note   This visit type was conducted due to national recommendations for restrictions regarding the COVID-19 Pandemic (e.g. social distancing) in an effort to limit this patient's exposure and mitigate transmission in our community.  Due to her co-morbid illnesses, this patient is at least at moderate risk for complications without adequate follow up.  This format is felt to be most appropriate for this patient at this time.  All issues noted in this document were discussed and addressed.  A limited physical exam was performed with this format.  Please refer to the patient's chart for her consent to telehealth for [facility].   Date:  03/23/2019   ID:  Elizabeth Wang, DOB 05-22-1946, MRN [id number]  Patient Location: Home Provider Location: Home  PCP:  [name], MD  Cardiologist:  [name], MD  Electrophysiologist:  None   Evaluation Performed:  Follow-Up Visit  Chief Complaint:  Annual f/u for Atrial Fibrillation   History of Present Illness:    Elizabeth Wang is a 73 y.o. female, followed by Dr. Meda Coffee, with a hx of paroxysmal atrial fibrillation, h/0 right leg DVT and PE in 06/2013 (provoked by travel and dehydration), chronic anticoagulation w/ Xarelto, HTN and HLD. This is her annual f/u. Last seen by Dr. Meda Coffee 12/2017.   She reports that she has done well since her last office visit.  She and her husband have moved to their vacation home at [facility] during the coronavirus to better self isolate.  She denies any recurrence of atrial fibrillation.  She does have the [place] available for monitoring.  She has noticed a significant reduction of symptoms after decreasing intake of caffeine and alcohol.  She reports full compliance with Xarelto.  She denies any abnormal bleeding.  No falls.  She also denies chest pain, dyspnea, lower extremity edema, orthopnea and PND.  Unfortunately she does not have a blood pressure cuff available at her vacation home.   She is scheduled for her annual follow-up with her PCP next week and plans to go into the office for routine labs.  She is on Crestor for hyperlipidemia.  Lipid panel 03/14/2018 showed well-controlled LDL at 68 mg/dL.  She denies any intolerances with statin.  The patient does not have symptoms concerning for COVID-19 infection (fever, chills, cough, or new shortness of breath).    Past Medical History:  Diagnosis Date  . Acute pulmonary embolism ([place]) 06/2013  . Arthritis   . Asthma    years ago   . Clotting disorder (HCC)    DVT/PE  . Diverticulosis    minor   . Dyslipidemia   . Esophageal reflux   . Hypertension   . Hypothyroidism   . Numbness    TOES  . Obesity   . Paroxysmal atrial fibrillation (HCC)    1 isolated episode in the setting of acute stress with no further episodes.   . Pseudogout   . Sleep apnea    "not bad enough to use c- pap"   Past Surgical History:  Procedure Laterality Date  . BREAST RECONSTRUCTION    . CESAREAN SECTION    . COLONOSCOPY  2004   repeat in ten years  . KNEE ARTHROSCOPY Left   . TONSILLECTOMY    . 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 TOTAL KNEE ARTHROPLASTY Left 07/23/2014  Procedure: LEFT TOTAL KNEE ARTHROPLASTY;  Surgeon: [name], MD;  Location: [facility];  Service: Orthopedics;  Laterality: Left;  . tummy tuck  1987  . WISDOM TOOTH EXTRACTION       Current Meds  Medication Sig  . colchicine 0.6 MG tablet Take 0.6 mg by mouth 2 (two) times daily. For gout  . diltiazem (CARDIZEM CD) 240 MG 24 hr capsule Take 1 capsule (240 mg total) by mouth daily. Please keep upcoming appt in May for future refills. Thank you  . EPINEPHrine (EPIPEN) 0.3 mg/0.3 mL SOAJ injection Inject 0.3 mg into the muscle as needed (Allergic Reaction).   . gabapentin (NEURONTIN) 300 MG capsule Take 300 mg by mouth at  bedtime.  . hydrochlorothiazide (HYDRODIURIL) 25 MG tablet Take 1 tablet (25 mg total) by mouth daily. Please schedule an appt for further refills  . levothyroxine (SYNTHROID, LEVOTHROID) 125 MCG tablet Take 125 mcg by mouth daily before breakfast. For thyroid therapy  . ondansetron (ZOFRAN-ODT) 4 MG disintegrating tablet Take 1 tablet by mouth as needed. For N&V  . rosuvastatin (CRESTOR) 10 MG tablet Take 1 tablet (10 mg total) by mouth daily. Please keep upcoming appt in May for future refills. Thank you  . TELMISARTAN PO Take 40 mg by mouth daily.   Alveda Reasons 20 MG TABS tablet TAKE ONE TABLET DAILY WITH SUPPER  . zolpidem (AMBIEN) 5 MG tablet Take 5 mg by mouth at bedtime as needed for sleep.     Allergies:   Nsaids and Codeine   Social History   Tobacco Use  . Smoking status: Former Smoker    Packs/day: 0.25    Years: 12.00    Pack years: 3.00    Last attempt to quit: 12/28/1975    Years since quitting: 43.2  . Smokeless tobacco: Never Used  Substance Use Topics  . Alcohol use: Yes    Alcohol/week: 4.0 standard drinks    Types: 4 Glasses of wine per week    Comment: socially  . Drug use: No     Family Hx: The patient's family history includes CVA in her mother; Coronary artery disease in her father; Heart disease in her father; Hypertension in her father.  ROS:   Please see the history of present illness.     All other systems reviewed and are negative.   Prior CV studies:   The following studies were reviewed today:  TTE: 05/14/17 Left ventricle: The cavity size was normal. Wall thickness was normal. Systolic function was normal. The estimated ejection fraction was in the range of 60% to 65%. Wall motion was normal; there were no regional wall motion abnormalities. Doppler parameters are consistent with abnormal left ventricular relaxation (grade 1 diastolic dysfunction). - Aortic valve: There was mild stenosis. Valve area (VTI): 3.19 cm^2. Valve area  (Vmax): 2.84 cm^2. Valve area (Vmean): 2.62 cm^2. - Mitral valve: Moderately calcified annulus. There was mild regurgitation. Valve area by continuity equation (using LVOT flow): 6.98 cm^2. - Left atrium: The atrium was moderately dilated.  Labs/Other Tests and Data Reviewed:    EKG:  An ECG dated 01/13/18 was personally reviewed today and demonstrated:  NSR, 1st degree AV block 79 bpm, LAE and LVH  Recent Labs: No results found for requested labs within last 8760 hours.   Recent Lipid Panel No results found for: CHOL, TRIG, HDL, CHOLHDL, LDLCALC, LDLDIRECT  Wt Readings from Last 3 Encounters:  03/23/19 220 lb (99.8 kg)  01/13/18 225 lb 9.6 oz (102.3  kg)  08/19/17 218 lb (98.9 kg)     Objective:    Vital Signs:  Ht 5' 3.5" (1.613 m)   Wt 220 lb (99.8 kg)   LMP  (LMP Unknown)   BMI 38.36 kg/m    VITAL SIGNS:  reviewed GEN:  no acute distress EYES:  sclerae anicteric, EOMI - Extraocular Movements Intact RESPIRATORY:  normal respiratory effort, symmetric expansion CARDIOVASCULAR:  no peripheral edema SKIN:  no rash, lesions or ulcers. MUSCULOSKELETAL:  no obvious deformities. NEURO:  alert and oriented x 3, no obvious focal deficit PSYCH:  normal affect  ASSESSMENT & PLAN:    1. PAF: She denies any recent palpitations or dyspnea.  She has cut back on caffeine and alcohol.  She remains on Cardizem for rate control.  CHA2DS2 VASc score at least 3. On Xarelto for stroke prophylaxis.  Denies abnormal bleeding and falls.  Last echo in 2018 showed normal LVEF and mild LAE.   2. HTN: Reports full compliance with meds.  Does not have a blood pressure monitor available at home, but blood pressure has been historically controlled with current regimen.  She is on Cardizem, hydrochlorothiazide and telmisartan. Labs followed by PCP and she is scheduled for her yearly labs next week.   3. HLD: on Crestor. Lipid panel followed by PCP. Last labs 02/2018 showed controlled LDL at 68  mg/dL.   4. H/o DVT/PE: on Xarelto.   COVID-19 Education: The signs and symptoms of COVID-19 were discussed with the patient and how to seek care for testing (follow up with PCP or arrange E-visit).  The importance of social distancing was discussed today.  Time:   Today, I have spent 15 minutes with the patient with telehealth technology discussing the above problems.     Medication Adjustments/Labs and Tests Ordered: Current medicines are reviewed at length with the patient today.  Concerns regarding medicines are outlined above.   Tests Ordered: No orders of the defined types were placed in this encounter.   Medication Changes: No orders of the defined types were placed in this encounter.   Disposition:  Follow up in 1 yr w/ Dr. Meda Coffee  Signed, Lyda Jester, PA-C  03/23/2019 10:53 AM    [place]

## 2019-03-23 NOTE — Patient Instructions (Signed)
Medication Instructions:  none If you need a refill on your cardiac medications before your next appointment, please call your pharmacy.   Lab work: none If you have labs (blood work) drawn today and your tests are completely normal, you will receive your results only by: [name] MyChart Message (if you have MyChart) OR . A paper copy in the mail If you have any lab test that is abnormal or we need to change your treatment, we will call you to review the results.  Testing/Procedures: none  Follow-Up: 1 YEAR with Dr Meda Coffee At [facility], you and your health needs are our priority.  As part of our continuing mission to provide you with exceptional heart care, we have created designated Provider Care Teams.  These Care Teams include your primary Cardiologist (physician) and Advanced Practice Providers (APPs -  Physician Assistants and Nurse Practitioners) who all work together to provide you with the care you need, when you need it.  Any Other Special Instructions Will Be Listed Below (If Applicable).

## 2019-03-28 DIAGNOSIS — E7849 Other hyperlipidemia: Secondary | ICD-10-CM | POA: Diagnosis not present

## 2019-03-28 DIAGNOSIS — E038 Other specified hypothyroidism: Secondary | ICD-10-CM | POA: Diagnosis not present

## 2019-03-28 DIAGNOSIS — I10 Essential (primary) hypertension: Secondary | ICD-10-CM | POA: Diagnosis not present

## 2019-03-28 DIAGNOSIS — R82998 Other abnormal findings in urine: Secondary | ICD-10-CM | POA: Diagnosis not present

## 2019-04-03 DIAGNOSIS — M1120 Other chondrocalcinosis, unspecified site: Secondary | ICD-10-CM | POA: Diagnosis not present

## 2019-04-03 DIAGNOSIS — M25562 Pain in left knee: Secondary | ICD-10-CM | POA: Insufficient documentation

## 2019-04-03 DIAGNOSIS — M2550 Pain in unspecified joint: Secondary | ICD-10-CM | POA: Diagnosis not present

## 2019-04-03 DIAGNOSIS — Z79899 Other long term (current) drug therapy: Secondary | ICD-10-CM | POA: Diagnosis not present

## 2019-04-03 DIAGNOSIS — M150 Primary generalized (osteo)arthritis: Secondary | ICD-10-CM | POA: Diagnosis not present

## 2019-04-04 DIAGNOSIS — Z1339 Encounter for screening examination for other mental health and behavioral disorders: Secondary | ICD-10-CM | POA: Diagnosis not present

## 2019-04-04 DIAGNOSIS — M7040 Prepatellar bursitis, unspecified knee: Secondary | ICD-10-CM | POA: Diagnosis not present

## 2019-04-04 DIAGNOSIS — R7301 Impaired fasting glucose: Secondary | ICD-10-CM | POA: Diagnosis not present

## 2019-04-04 DIAGNOSIS — N183 Chronic kidney disease, stage 3 (moderate): Secondary | ICD-10-CM | POA: Diagnosis not present

## 2019-04-04 DIAGNOSIS — I2510 Atherosclerotic heart disease of native coronary artery without angina pectoris: Secondary | ICD-10-CM | POA: Diagnosis not present

## 2019-04-04 DIAGNOSIS — Z1331 Encounter for screening for depression: Secondary | ICD-10-CM | POA: Diagnosis not present

## 2019-04-04 DIAGNOSIS — E785 Hyperlipidemia, unspecified: Secondary | ICD-10-CM | POA: Diagnosis not present

## 2019-04-04 DIAGNOSIS — M1180 Other specified crystal arthropathies, unspecified site: Secondary | ICD-10-CM | POA: Diagnosis not present

## 2019-04-04 DIAGNOSIS — I129 Hypertensive chronic kidney disease with stage 1 through stage 4 chronic kidney disease, or unspecified chronic kidney disease: Secondary | ICD-10-CM | POA: Diagnosis not present

## 2019-04-04 DIAGNOSIS — Z7901 Long term (current) use of anticoagulants: Secondary | ICD-10-CM | POA: Diagnosis not present

## 2019-04-04 DIAGNOSIS — I480 Paroxysmal atrial fibrillation: Secondary | ICD-10-CM | POA: Diagnosis not present

## 2019-04-04 DIAGNOSIS — E039 Hypothyroidism, unspecified: Secondary | ICD-10-CM | POA: Diagnosis not present

## 2019-04-04 DIAGNOSIS — Z0000 Encounter for general adult medical examination without abnormal findings: Secondary | ICD-10-CM | POA: Diagnosis not present

## 2019-04-05 DIAGNOSIS — S8002XA Contusion of left knee, initial encounter: Secondary | ICD-10-CM | POA: Insufficient documentation

## 2019-04-06 DIAGNOSIS — S8002XD Contusion of left knee, subsequent encounter: Secondary | ICD-10-CM | POA: Diagnosis not present

## 2019-04-06 DIAGNOSIS — Z96652 Presence of left artificial knee joint: Secondary | ICD-10-CM | POA: Diagnosis not present

## 2019-04-20 DIAGNOSIS — S2002XA Contusion of left breast, initial encounter: Secondary | ICD-10-CM | POA: Diagnosis not present

## 2019-04-20 DIAGNOSIS — S2001XA Contusion of right breast, initial encounter: Secondary | ICD-10-CM | POA: Diagnosis not present

## 2019-04-20 DIAGNOSIS — N63 Unspecified lump in unspecified breast: Secondary | ICD-10-CM | POA: Diagnosis not present

## 2019-05-02 ENCOUNTER — Other Ambulatory Visit: Payer: Self-pay | Admitting: Cardiology

## 2019-05-02 DIAGNOSIS — I480 Paroxysmal atrial fibrillation: Secondary | ICD-10-CM

## 2019-05-02 DIAGNOSIS — E782 Mixed hyperlipidemia: Secondary | ICD-10-CM

## 2019-05-02 DIAGNOSIS — I10 Essential (primary) hypertension: Secondary | ICD-10-CM

## 2019-06-01 ENCOUNTER — Other Ambulatory Visit: Payer: Self-pay | Admitting: Cardiology

## 2019-06-01 NOTE — Telephone Encounter (Signed)
Age 73 weight 99.8kg, SCr 1.1 on 03/28/19 at [facility], CrCl 12mL/min, last OV May 2020, afib indication

## 2019-07-04 ENCOUNTER — Other Ambulatory Visit: Payer: Self-pay | Admitting: Cardiology

## 2019-07-04 DIAGNOSIS — I480 Paroxysmal atrial fibrillation: Secondary | ICD-10-CM

## 2019-07-04 DIAGNOSIS — I10 Essential (primary) hypertension: Secondary | ICD-10-CM

## 2019-07-04 DIAGNOSIS — E782 Mixed hyperlipidemia: Secondary | ICD-10-CM

## 2019-07-08 ENCOUNTER — Other Ambulatory Visit: Payer: Self-pay | Admitting: Cardiology

## 2019-07-08 DIAGNOSIS — I480 Paroxysmal atrial fibrillation: Secondary | ICD-10-CM

## 2019-07-08 DIAGNOSIS — I10 Essential (primary) hypertension: Secondary | ICD-10-CM

## 2019-07-08 DIAGNOSIS — E782 Mixed hyperlipidemia: Secondary | ICD-10-CM

## 2019-07-20 DIAGNOSIS — Z23 Encounter for immunization: Secondary | ICD-10-CM | POA: Diagnosis not present

## 2019-10-04 DIAGNOSIS — Z79899 Other long term (current) drug therapy: Secondary | ICD-10-CM | POA: Diagnosis not present

## 2019-10-04 DIAGNOSIS — M150 Primary generalized (osteo)arthritis: Secondary | ICD-10-CM | POA: Diagnosis not present

## 2019-10-04 DIAGNOSIS — M2550 Pain in unspecified joint: Secondary | ICD-10-CM | POA: Diagnosis not present

## 2019-10-04 DIAGNOSIS — M1120 Other chondrocalcinosis, unspecified site: Secondary | ICD-10-CM | POA: Diagnosis not present

## 2019-10-06 DIAGNOSIS — I129 Hypertensive chronic kidney disease with stage 1 through stage 4 chronic kidney disease, or unspecified chronic kidney disease: Secondary | ICD-10-CM | POA: Diagnosis not present

## 2019-11-17 ENCOUNTER — Ambulatory Visit: Payer: Medicare Other | Attending: Internal Medicine

## 2019-11-17 NOTE — Progress Notes (Signed)
   Covid-19 Vaccination Clinic  Name:  Elizabeth Wang    MRN: [id number] DOB: 1946/08/23  11/17/2019  Elizabeth Wang was observed post Covid-19 immunization for 15 minutes without incidence. She was provided with Vaccine Information Sheet and instruction to access the V-Safe system.   Elizabeth Wang was instructed to call 911 with any severe reactions post vaccine: [name] Difficulty breathing  . Swelling of your face and throat  . A fast heartbeat  . A bad rash all over your body  . Dizziness and weakness

## 2019-11-20 ENCOUNTER — Telehealth: Payer: Self-pay

## 2019-11-20 ENCOUNTER — Telehealth: Payer: Self-pay | Admitting: Cardiology

## 2019-11-20 NOTE — Telephone Encounter (Signed)
I spoke to the patient who called in reference to her wearing her husband's Apple Watch and it detecting A Fib with HR 75-low 90s.  She is on Xarelto 20 mg, nightly.  She feels good denying CP or SOB.    She has been working and moving about, feeling fine.  She had CoVid vaccine on Friday and minimal alcohol over the weekend.  I told her to continue monitoring and to keep [place] updated.  She verbalized understanding.

## 2019-11-20 NOTE — Telephone Encounter (Signed)
.  Patient c/o Palpitations:  High priority if patient c/o lightheadedness, shortness of breath, or chest pain  1) How long have you had palpitations/irregular HR/ Afib? Are you having the symptoms now?  It stated last bight- she send a message through my chart and pictures of her heart.   2) Are you currently experiencing lightheadedness, SOB or CP? no  3) Do you have a history of afib (atrial fibrillation) or irregular heart rhythm? AFib 4) Have you checked your BP or HR? (document readings if available): no  5) Are you experiencing any other symptoms? no

## 2019-11-20 NOTE — Telephone Encounter (Signed)
Please make sure you continue taking xarelto, and diltiazem, preferably no alcohol in the next week, hydrate well, Covid vaccine can potentially trigger this.  Please keep me posted what your heart rhythm is, you can send strips as well.  Hope you feel better! [place] Siren

## 2019-11-20 NOTE — Telephone Encounter (Signed)
I spoke with the patient and shared Dr Francesca Oman recommendations.  She verbalized understanding and will keep Elizabeth Wang and Dr Meda Coffee updated throughout the week.

## 2019-11-21 ENCOUNTER — Encounter: Payer: Self-pay | Admitting: *Deleted

## 2019-11-21 NOTE — Telephone Encounter (Signed)
Dorothy Spark, MD to Frederik Schmidt, RN     11/20/19 11:26 AM Please make sure you continue taking xarelto, and diltiazem, preferably no alcohol in the next week, hydrate well, Covid vaccine can potentially trigger this.  Please keep me posted what your heart rhythm is, you can send strips as well.  Hope you feel better!  Ramona above sent to the pts mychart account, on behalf of Dr. Francesca Oman recommendations.

## 2019-11-21 NOTE — Telephone Encounter (Signed)
Mychart message sent in regards to Dr. Francesca Oman recommendations to the pts call placed on 11/20/19.  Advised the pt to call back if further assistance needed.

## 2019-11-22 NOTE — Telephone Encounter (Signed)
Pt confirmed that she can make it to her OV with Dr. Meda Coffee on 2/5 at 2 pm.

## 2019-12-01 ENCOUNTER — Ambulatory Visit (INDEPENDENT_AMBULATORY_CARE_PROVIDER_SITE_OTHER): Payer: Medicare Other | Admitting: Cardiology

## 2019-12-01 ENCOUNTER — Encounter: Payer: Self-pay | Admitting: Cardiology

## 2019-12-01 ENCOUNTER — Other Ambulatory Visit: Payer: Self-pay

## 2019-12-01 VITALS — BP 118/78 | HR 91 | Ht 63.0 in | Wt 228.6 lb

## 2019-12-01 DIAGNOSIS — I4891 Unspecified atrial fibrillation: Secondary | ICD-10-CM

## 2019-12-01 DIAGNOSIS — I10 Essential (primary) hypertension: Secondary | ICD-10-CM

## 2019-12-01 DIAGNOSIS — Z01812 Encounter for preprocedural laboratory examination: Secondary | ICD-10-CM | POA: Diagnosis not present

## 2019-12-01 DIAGNOSIS — Z7901 Long term (current) use of anticoagulants: Secondary | ICD-10-CM

## 2019-12-01 DIAGNOSIS — E782 Mixed hyperlipidemia: Secondary | ICD-10-CM | POA: Diagnosis not present

## 2019-12-01 DIAGNOSIS — I480 Paroxysmal atrial fibrillation: Secondary | ICD-10-CM

## 2019-12-01 NOTE — Patient Instructions (Signed)
Medication Instructions:   Your physician recommends that you continue on your current medications as directed. Please refer to the Current Medication list given to you today.  *If you need a refill on your cardiac medications before your next appointment, please call your pharmacy*   Lab Work:  TODAY--CMET, CBC, TSH, PT/INR, AND LIPIDS  If you have labs (blood work) drawn today and your tests are completely normal, you will receive your results only by: [name] MyChart Message (if you have MyChart) OR . A paper copy in the mail If you have any lab test that is abnormal or we need to change your treatment, we will call you to review the results.   Testing/Procedures:  Dear Elizabeth Wang (patient name) You are scheduled for a  Cardioversion on Thursday 12/21/19 AT 8 AM with Dr. Acie Fredrickson.  Please arrive at the [facility] (Main Entrance A) at [facility]: [address] [place], [place] [address] at 7:00 AM am/pm. (1 hour prior to procedure unless lab work is needed)  DIET: Nothing to eat or drink after midnight except a sip of water with medications (see medication instructions below)  Medication Instructions: Hold HYDROCHLOROTHIAZIDE THE MORNING OF THIS PROCEDURE  Continue your anticoagulant: XARELTO You will need to continue your anticoagulant after your procedure until you are told by your Provider that it is safe to stop   Labs: TODAY  Come to: TODAY 12/01/19 AT OUR OFFICE WE WILL CHECK CMET, CBC, TSH, PT/INR, AND LIPIDS (Lab option #1) Come to the lab at [place] between the hours of 8:00 am and 4:30 pm. You do not have to be fasting. (Lab option #2) Lab at an alternate location (Lab option #3) your lab work will be done at the hospital prior to your procedure - you will need to arrive 1  hours ahead of your procedure  You must have a responsible person to drive you home and stay in the waiting area during your procedure. Failure to do so could result in  cancellation.  Bring your insurance cards.  *Special Note: Every effort is made to have your procedure done on time. Occasionally there are emergencies that occur at the hospital that may cause delays. Please be patient if a delay does occur.   PLEASE GO TO [facility] TO [place] TEST DONE ON 12/18/19 AT 2:50 PM.  PLEASE LOOK FOR THE SECURITY GUARD THERE AND THEY WILL INSTRUCT YOU WERE TO DRIVE UP TO TO GET YOUR COVID TEST DONE.  PLEASE MAKE SURE YOU QUARANTINE AFTER YOUR COVID TEST, AND UNTIL YOUR PROCEDURE DATE ON 12/21/19.    Follow-Up: At [facility], you and your health needs are our priority.  As part of our continuing mission to provide you with exceptional heart care, we have created designated Provider Care Teams.  These Care Teams include your primary Cardiologist (physician) and Advanced Practice Providers (APPs -  Physician Assistants and Nurse Practitioners) who all work together to provide you with the care you need, when you need it.  Your next appointment:   3 month(s)  The format for your next appointment:   In Person  Provider:   [name], MD

## 2019-12-01 NOTE — Progress Notes (Addendum)
Cardiology Office Note:    Date:  06/03/17  ID:  Elizabeth Wang, DOB 31-Jul-1946, MRN [id number]  PCP:  [name], MD  Cardiologist:  [name], MD   Referring MD: [name], MD   Chief complain: Atrial fibrillation  History of Present Illness:    Elizabeth Wang is a 74 y.o. female with a hx of paroxysmal atrial fibrillation (1 episode in the setting of acute distress and 2014), hypertension, right leg DVT and PE in 06/2013 (provoked by travel and dehydration) .   The patient's episode of atrial fibrillation occurred during a time of acute stress is much more than her usual. She was symptomatic with this and has not had any recurrence since. It is thought that she would beable to detect recurrence. She was continued on diltiazem for control. She was seen by hematologist at [facility] who felt that her risk of CVA was low and she could come off of her Xarelto. She was placed on 162 mg of aspirin daily. The plan was that if she develops atrial fibrillation again that we would encourage anticoagulation.  The patient had an echocardiogram in 06/2013 that showed normal LVEF, trivial AR, moderately to severely calcified mitral annulus.  She was seen in the afib clinic for evaluation for continuation of afib symptoms. Kardia strips are reviewed showing evidence of paroxysmal afib. She is now back on xarelto for a chadsvasc score of at least 3.  12/01/2019 -the patient is coming after 6 months, she has been doing great, she remains very busy and has no symptoms of chest pain or shortness of breath.  She received her first dose of COVID-19 vaccine on January 22 and 2 days later noticed on her apple watch that she is in atrial fibrillation.  Since then she has received several alerts from the watch that she is in A. fib however she does not feel any palpitation dizziness or syncope.  She has been very compliant with her Eliquis and has no bleeding. The patient states that her husband observed  apneic episodes and snoring.  Past Medical History:  Diagnosis Date  . Acute pulmonary embolism ([place]) 06/2013  . Arthritis   . Asthma    years ago   . Clotting disorder (HCC)    DVT/PE  . Diverticulosis    minor   . Dyslipidemia   . Esophageal reflux   . Hypertension   . Hypothyroidism   . Numbness    TOES  . Obesity   . Paroxysmal atrial fibrillation (HCC)    1 isolated episode in the setting of acute stress with no further episodes.   . Pseudogout   . Sleep apnea    "not bad enough to use c- pap"    Past Surgical History:  Procedure Laterality Date  . BREAST RECONSTRUCTION    . CESAREAN SECTION    . COLONOSCOPY  2004   repeat in ten years  . KNEE ARTHROSCOPY Left   . TONSILLECTOMY    . 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 TOTAL KNEE ARTHROPLASTY Left 07/23/2014   Procedure: LEFT TOTAL KNEE ARTHROPLASTY;  Surgeon: [name], MD;  Location: [facility];  Service: Orthopedics;  Laterality: Left;  . tummy tuck  1987  . WISDOM TOOTH EXTRACTION      Current Medications: Current Meds  Medication Sig  . colchicine 0.6 MG tablet  Take 0.6 mg by mouth 2 (two) times daily. For gout  . diltiazem (CARDIZEM CD) 240 MG 24 hr capsule TAKE ONE CAPSULE EACH DAY  . EPINEPHrine (EPIPEN) 0.3 mg/0.3 mL SOAJ injection Inject 0.3 mg into the muscle as needed (Allergic Reaction).   . gabapentin (NEURONTIN) 300 MG capsule Take 300 mg by mouth at bedtime.  . hydrochlorothiazide (HYDRODIURIL) 25 MG tablet TAKE ONE TABLET DAILY  . levothyroxine (SYNTHROID, LEVOTHROID) 125 MCG tablet Take 125 mcg by mouth daily before breakfast. For thyroid therapy  . ondansetron (ZOFRAN-ODT) 4 MG disintegrating tablet Take 1 tablet by mouth as needed. For N&V  . rosuvastatin (CRESTOR) 10 MG tablet TAKE ONE TABLET EACH DAY  . telmisartan (MICARDIS) 80 MG tablet Take 80  mg by mouth daily.  . XARELTO 20 MG TABS tablet TAKE ONE TABLET EACH DAY WITH SUPPER  . zolpidem (AMBIEN) 5 MG tablet Take 5 mg by mouth at bedtime as needed for sleep.     Allergies:   Nsaids and Codeine   Social History   Socioeconomic History  . Marital status: Married    Spouse name: Not on file  . Number of children: Not on file  . Years of education: Not on file  . Highest education level: Not on file  Occupational History  . Occupation: Financial Advisor  Tobacco Use  . Smoking status: Former Smoker    Packs/day: 0.25    Years: 12.00    Pack years: 3.00    Quit date: 12/28/1975    Years since quitting: 43.9  . Smokeless tobacco: Never Used  Substance and Sexual Activity  . Alcohol use: Yes    Alcohol/week: 4.0 standard drinks    Types: 4 Glasses of wine per week    Comment: socially  . Drug use: No  . Sexual activity: Not on file  Other Topics Concern  . Not on file  Social History Narrative   Lives in [place] with her husband. Exercises at the gym 4 times a week.   Social Determinants of Health   Financial Resource Strain:   . Difficulty of Paying Living Expenses: Not on file  Food Insecurity:   . Worried About Running Out of Food in the Last Year: Not on file  . Ran Out of Food in the Last Year: Not on file  Transportation Needs:   . Lack of Transportation (Medical): Not on file  . Lack of Transportation (Non-Medical): Not on file  Physical Activity:   . Days of Exercise per Week: Not on file  . Minutes of Exercise per Session: Not on file  Stress:   . Feeling of Stress : Not on file  Social Connections:   . Frequency of Communication with Friends and Family: Not on file  . Frequency of Social Gatherings with Friends and Family: Not on file  . Attends Religious Services: Not on file  . Active Member of Clubs or Organizations: Not on file  . Attends Club or Organization Meetings: Not on file  . Marital Status: Not on file     Family History: The  patient's family history includes CVA in her mother; Coronary artery disease in her father; Heart disease in her father; Hypertension in her father. ROS:   Please see the history of present illness.     All other systems reviewed and are negative.  EKGs/Labs/Other Studies Reviewed:    The following studies were reviewed today:  EKG:  EKG is  ordered today.  The ekg ordered today   demonstrates atrial fibrillation with 80 bpm, this is changed from the last EKG that showed normal sinus rhythm.  This was personally reviewed.  Recent Labs: No results found for requested labs within last 8760 hours.  Recent Lipid Panel No results found for: CHOL, TRIG, HDL, CHOLHDL, VLDL, LDLCALC, LDLDIRECT  Physical Exam:    VS:  BP 118/78   Pulse 91   Ht 5' 3" (1.6 m)   Wt 228 lb 9.6 oz (103.7 kg)   LMP  (LMP Unknown)   SpO2 94%   BMI 40.49 kg/m     Wt Readings from Last 3 Encounters:  12/01/19 228 lb 9.6 oz (103.7 kg)  03/23/19 220 lb (99.8 kg)  01/13/18 225 lb 9.6 oz (102.3 kg)     GEN:  Well nourished, well developed in no acute distress HEENT: Normal NECK: No JVD; No carotid bruits LYMPHATICS: No lymphadenopathy CARDIAC: RRR, no murmurs, rubs, gallops RESPIRATORY:  Clear to auscultation without rales, wheezing or rhonchi  ABDOMEN: Soft, non-tender, non-distended MUSCULOSKELETAL:  No edema; No deformity  SKIN: Warm and dry NEUROLOGIC:  Alert and oriented x 3 PSYCHIATRIC:  Normal affect   TTE: 05/14/17 Left ventricle: The cavity size was normal. Wall thickness was   normal. Systolic function was normal. The estimated ejection   fraction was in the range of 60% to 65%. Wall motion was normal;   there were no regional wall motion abnormalities. Doppler   parameters are consistent with abnormal left ventricular   relaxation (grade 1 diastolic dysfunction). - Aortic valve: There was mild stenosis. Valve area (VTI): 3.19   cm^2. Valve area (Vmax): 2.84 cm^2. Valve area (Vmean): 2.62    cm^2. - Mitral valve: Moderately calcified annulus. There was mild   regurgitation. Valve area by continuity equation (using LVOT   flow): 6.98 cm^2. - Left atrium: The atrium was moderately dilated.  EKG 01/13/2018 shows sinus rhythm with first-degree AV block otherwise normal EKG unchanged from prior.    ASSESSMENT:    1. PAF (paroxysmal atrial fibrillation) (HCC)   2. Chronic anticoagulation   3. Essential hypertension   4. Mixed hyperlipidemia   5. Pre-procedure lab exam   6. Atrial fibrillation, unspecified type (HCC)     PLAN:    In order of problems listed above:  1. Paroxysmal symptomatic afib Echo in 2018 showed LVEF 60-65% with moderately dilated left atrium.  She is now back in atrial fibrillation that is rate controlled, this was possibly triggered by COVID-19 vaccine.  She is not symptomatic.  She is receiving her second dose of Covid vaccine on February 12, I would like to schedule a cardioversion at [facility] approximately week after that.  She has been compliant with her Eliquis, her hemoglobin is normal.  2. HTN  Well-controlled on current regimen  3. Hyperlipidemia, she tolerates Crestor well, will obtain labs today  4. Snoring, apneic episodes - high suspicion for sleep apnea, we will arrange for a sleep evaluation, this could also be a trigger for her episodes of atrial fibrillation  We will schedule cardioversion the week of February 22, we will obtain labs today including CBC, CMP, TSH, lipids and INR.  Follow-up in 3 months.  Medication Adjustments/Labs and Tests Ordered: Current medicines are reviewed at length with the patient today.  Concerns regarding medicines are outlined above.  Orders Placed This Encounter  Procedures  . Comp Met (CMET)  . TSH  . CBC  . Protime-INR  . Lipid Profile  . EKG 12-Lead     No orders of the defined types were placed in this encounter.  Signed, Katarina Nelson, MD  12/01/2019 3:14 PM    [facility]  

## 2019-12-02 LAB — CBC
Hematocrit: 41.8 % (ref 34.0–46.6)
Hemoglobin: 13.9 g/dL (ref 11.1–15.9)
MCH: 31.6 pg (ref 26.6–33.0)
MCHC: 33.3 g/dL (ref 31.5–35.7)
MCV: 95 fL (ref 79–97)
Platelets: 204 10*3/uL (ref 150–450)
RBC: 4.4 x10E6/uL (ref 3.77–5.28)
RDW: 12.1 % (ref 11.7–15.4)
WBC: 7.1 10*3/uL (ref 3.4–10.8)

## 2019-12-02 LAB — COMPREHENSIVE METABOLIC PANEL
ALT: 35 IU/L — ABNORMAL HIGH (ref 0–32)
AST: 31 IU/L (ref 0–40)
Albumin/Globulin Ratio: 1.6 (ref 1.2–2.2)
Albumin: 4.2 g/dL (ref 3.7–4.7)
Alkaline Phosphatase: 70 IU/L (ref 39–117)
BUN/Creatinine Ratio: 18 (ref 12–28)
BUN: 21 mg/dL (ref 8–27)
Bilirubin Total: 0.6 mg/dL (ref 0.0–1.2)
CO2: 26 mmol/L (ref 20–29)
Calcium: 9.9 mg/dL (ref 8.7–10.3)
Chloride: 98 mmol/L (ref 96–106)
Creatinine, Ser: 1.14 mg/dL — ABNORMAL HIGH (ref 0.57–1.00)
GFR calc Af Amer: 55 mL/min/{1.73_m2} — ABNORMAL LOW (ref >59)
GFR calc non Af Amer: 48 mL/min/{1.73_m2} — ABNORMAL LOW (ref >59)
Globulin, Total: 2.6 g/dL (ref 1.5–4.5)
Glucose: 92 mg/dL (ref 65–99)
Potassium: 3.3 mmol/L — ABNORMAL LOW (ref 3.5–5.2)
Sodium: 138 mmol/L (ref 134–144)
Total Protein: 6.8 g/dL (ref 6.0–8.5)

## 2019-12-02 LAB — TSH: TSH: 1.15 u[IU]/mL (ref 0.450–4.500)

## 2019-12-02 LAB — LIPID PANEL
Chol/HDL Ratio: 3 ratio (ref 0.0–4.4)
Cholesterol, Total: 138 mg/dL (ref 100–199)
HDL: 46 mg/dL (ref >39)
LDL Chol Calc (NIH): 64 mg/dL (ref 0–99)
Triglycerides: 168 mg/dL — ABNORMAL HIGH (ref 0–149)
VLDL Cholesterol Cal: 28 mg/dL (ref 5–40)

## 2019-12-02 LAB — PROTIME-INR
INR: 1.1 (ref 0.9–1.2)
Prothrombin Time: 11.7 s (ref 9.1–12.0)

## 2019-12-06 ENCOUNTER — Other Ambulatory Visit: Payer: Self-pay | Admitting: Cardiology

## 2019-12-06 NOTE — Telephone Encounter (Signed)
Prescription refill request for Xarelto received.   Last office visit: 12/01/2019, Meda Coffee Weight: 103.7 kg Age: 74 y.o. Scr: 1.14, 12/01/2019 CrCl: 72 ml/min    Prescription refill sent.

## 2019-12-07 DIAGNOSIS — H52223 Regular astigmatism, bilateral: Secondary | ICD-10-CM | POA: Diagnosis not present

## 2019-12-07 DIAGNOSIS — H524 Presbyopia: Secondary | ICD-10-CM | POA: Diagnosis not present

## 2019-12-07 DIAGNOSIS — H00024 Hordeolum internum left upper eyelid: Secondary | ICD-10-CM | POA: Diagnosis not present

## 2019-12-07 DIAGNOSIS — H5203 Hypermetropia, bilateral: Secondary | ICD-10-CM | POA: Diagnosis not present

## 2019-12-08 ENCOUNTER — Ambulatory Visit: Payer: Medicare Other | Attending: Internal Medicine

## 2019-12-08 DIAGNOSIS — Z23 Encounter for immunization: Secondary | ICD-10-CM | POA: Insufficient documentation

## 2019-12-08 NOTE — Progress Notes (Signed)
   Covid-19 Vaccination Clinic  Name:  Elizabeth Wang    MRN: [id number] DOB: 04/11/1946  12/08/2019  [name] was observed post Covid-19 immunization for 15 minutes without incidence. She was provided with Vaccine Information Sheet and instruction to access the V-Safe system.   [name] was instructed to call 911 with any severe reactions post vaccine: [name] Difficulty breathing  . Swelling of your face and throat  . A fast heartbeat  . A bad rash all over your body  . Dizziness and weakness    Immunizations Administered    Name Date Dose VIS Date Route   Pfizer COVID-19 Vaccine 12/08/2019  8:53 AM 0.3 mL 10/06/2019 Intramuscular   Manufacturer: [place]   Lot: [id number]   [place]: [id number]

## 2019-12-15 ENCOUNTER — Telehealth: Payer: Self-pay | Admitting: Nurse Practitioner

## 2019-12-15 DIAGNOSIS — I480 Paroxysmal atrial fibrillation: Secondary | ICD-10-CM

## 2019-12-15 DIAGNOSIS — Z8669 Personal history of other diseases of the nervous system and sense organs: Secondary | ICD-10-CM

## 2019-12-15 NOTE — Telephone Encounter (Signed)
Spoke with patient and advised her that I have not received a message back from Dr. Meda Coffee and asked if she would like for me to cancel her Covid screening appointment for Monday. Cardioversion is scheduled for Thursday 2/25. Patient states she would like to keep that appointment in the event that Dr. Meda Coffee wants her to proceed with the cardioversion on Thursday. Patient states she will call back Monday morning to hear from Dr. Francesca Oman decision. She thanked me for the call.

## 2019-12-15 NOTE — Telephone Encounter (Signed)
I called patient in response to her MyChart message to Dr. Meda Coffee. Patient states she has never mentioned the possibility that she has sleep apnea to Dr. Meda Coffee and she thinks this should be addressed prior to having cardioversion. I advised that I will forward message to Dr. Meda Coffee and that I will call her back later today to discuss whether we should cancel patient's Covid screening on Monday. Patient verbalized understanding and agreement and was thankful for the call.

## 2019-12-15 NOTE — Telephone Encounter (Signed)
Follow Up  Patient is calling in to speak with nurse about upcoming procedure. Please give patient a call back to discuss.

## 2019-12-18 ENCOUNTER — Other Ambulatory Visit (HOSPITAL_COMMUNITY): Payer: BLUE CROSS/BLUE SHIELD

## 2019-12-18 NOTE — Telephone Encounter (Signed)
Patient is calling to follow up in regards to whether or not she needs to have appointment scheduled for today, 12/18/19 cancelled. Please return call to advise.

## 2019-12-18 NOTE — Telephone Encounter (Signed)
Spoke with patient who does want to cancel cardioversion for now and be evaluated for sleep apnea.  advised patient ordered will be placed and she will be contacted by the office to be scheduled for a sleep study.  Pt states understanding.

## 2019-12-21 ENCOUNTER — Encounter (HOSPITAL_COMMUNITY): Admission: RE | Payer: Self-pay | Source: Home / Self Care

## 2019-12-21 ENCOUNTER — Ambulatory Visit (HOSPITAL_COMMUNITY): Admission: RE | Admit: 2019-12-21 | Payer: Medicare Other | Source: Home / Self Care | Admitting: Cardiovascular Disease

## 2019-12-21 SURGERY — CARDIOVERSION
Anesthesia: General

## 2019-12-22 ENCOUNTER — Telehealth: Payer: Self-pay | Admitting: *Deleted

## 2019-12-22 NOTE — Telephone Encounter (Signed)
Split night sent to sleep pool. °

## 2019-12-22 NOTE — Telephone Encounter (Signed)
-----   Message from Dorothy Spark, MD sent at 12/22/2019 10:13 AM EST ----- Regarding: RE: sleep study referral I just placed orders ----- Message ----- From: Freada Bergeron, CMA Sent: 12/21/2019   1:10 PM EST To: Dorothy Spark, MD Subject: sleep study referral                           Hello Dr Meda Coffee,  Patient states she was referred for a sleep study but I can not find an office note where you mentioned you will order a sleep study on the patient. Please addend your 2/5 note to order a sleep study with symptoms and I will start the process.  Thanks,  nina

## 2019-12-25 ENCOUNTER — Telehealth: Payer: Self-pay | Admitting: *Deleted

## 2019-12-25 NOTE — Telephone Encounter (Signed)
Staff message sent to Gae Bon ok to schedule sleep study. Patient has Medicare. No PA is required. BCBS is a supplement policy.

## 2020-01-01 ENCOUNTER — Telehealth: Payer: Self-pay | Admitting: *Deleted

## 2020-01-01 NOTE — Telephone Encounter (Signed)
Patient is scheduled for lab study on 01/14/20.  pt is scheduled for COVID screening on 01/11/20 2:30 prior to SS. Patient understands her sleep study will be done at [facility] sleep lab. Patient understands she will receive a sleep packet in a week or so. Patient understands to call if she does not receive the sleep packet in a timely manner.  Left detailed message on voicemail with date and time of titration and informed patient to call back to confirm or reschedule.

## 2020-01-01 NOTE — Telephone Encounter (Signed)
-----   Message from Lauralee Evener, [place] sent at 12/25/2019 10:53 AM EST ----- Regarding: RE: precert No PA required. Ok to schedule. ----- Message ----- From: Freada Bergeron, CMA Sent: 12/22/2019   3:11 PM EST To: Cv Div Sleep Studies Subject: precert                                        Split night per Dr Meda Coffee

## 2020-01-02 ENCOUNTER — Other Ambulatory Visit: Payer: Self-pay | Admitting: Cardiology

## 2020-01-02 DIAGNOSIS — H43393 Other vitreous opacities, bilateral: Secondary | ICD-10-CM | POA: Diagnosis not present

## 2020-01-02 DIAGNOSIS — I10 Essential (primary) hypertension: Secondary | ICD-10-CM

## 2020-01-02 DIAGNOSIS — I480 Paroxysmal atrial fibrillation: Secondary | ICD-10-CM

## 2020-01-02 DIAGNOSIS — E782 Mixed hyperlipidemia: Secondary | ICD-10-CM

## 2020-01-02 DIAGNOSIS — H43813 Vitreous degeneration, bilateral: Secondary | ICD-10-CM | POA: Diagnosis not present

## 2020-01-11 ENCOUNTER — Other Ambulatory Visit (HOSPITAL_COMMUNITY)
Admission: RE | Admit: 2020-01-11 | Discharge: 2020-01-11 | Disposition: A | Discharge status: Home / Self Care | Payer: Medicare Other | Source: Ambulatory Visit | Attending: Cardiology | Admitting: Cardiology

## 2020-01-11 DIAGNOSIS — Z01812 Encounter for preprocedural laboratory examination: Secondary | ICD-10-CM | POA: Diagnosis not present

## 2020-01-11 DIAGNOSIS — Z20822 Contact with and (suspected) exposure to covid-19: Secondary | ICD-10-CM | POA: Insufficient documentation

## 2020-01-11 LAB — SARS CORONAVIRUS 2 (TAT 6-24 HRS): SARS Coronavirus 2: NEGATIVE

## 2020-01-14 ENCOUNTER — Ambulatory Visit (HOSPITAL_BASED_OUTPATIENT_CLINIC_OR_DEPARTMENT_OTHER): Payer: Medicare Other | Attending: Cardiology | Admitting: Cardiology

## 2020-01-14 ENCOUNTER — Other Ambulatory Visit: Payer: Self-pay

## 2020-01-14 DIAGNOSIS — G4733 Obstructive sleep apnea (adult) (pediatric): Secondary | ICD-10-CM | POA: Insufficient documentation

## 2020-01-14 DIAGNOSIS — R0902 Hypoxemia: Secondary | ICD-10-CM | POA: Diagnosis not present

## 2020-01-14 DIAGNOSIS — G4734 Idiopathic sleep related nonobstructive alveolar hypoventilation: Secondary | ICD-10-CM

## 2020-01-14 DIAGNOSIS — I480 Paroxysmal atrial fibrillation: Secondary | ICD-10-CM | POA: Insufficient documentation

## 2020-01-14 DIAGNOSIS — Z8669 Personal history of other diseases of the nervous system and sense organs: Secondary | ICD-10-CM

## 2020-01-14 DIAGNOSIS — I493 Ventricular premature depolarization: Secondary | ICD-10-CM | POA: Insufficient documentation

## 2020-01-16 NOTE — Procedures (Signed)
Patient Name: Elizabeth Wang, Agent Date: 01/14/2020 Gender: Female D.O.B: 17-Jan-1946 Age (years): 4 Referring Provider: Dorothy Spark Height (inches): 58 Interpreting Physician: Fransico Him MD, ABSM Weight (lbs): 220 RPSGT: Lanae Boast BMI: 39 MRN: [id number] Neck Size: 15.50  CLINICAL INFORMATION Sleep Study Type: Split Night CPAP  Indication for sleep study: Snoring  Epworth Sleepiness Score: 3  SLEEP STUDY TECHNIQUE As per the AASM Manual for the Scoring of Sleep and Associated Events v2.3 (April 2016) with a hypopnea requiring 4% desaturations.  The channels recorded and monitored were frontal, central and occipital EEG, electrooculogram (EOG), submentalis EMG (chin), nasal and oral airflow, thoracic and abdominal wall motion, anterior tibialis EMG, snore microphone, electrocardiogram, and pulse oximetry. Continuous positive airway pressure (CPAP) was initiated when the patient met split night criteria and was titrated according to treat sleep-disordered breathing.  MEDICATIONS Medications self-administered by patient taken the night of the study : N/A  RESPIRATORY PARAMETERS Diagnostic Total AHI (/hr): 51.2  RDI (/hr):56.8  OA Index (/hr): 3.8  CA Index (/hr): 0.0 REM AHI (/hr): 60.0  NREM AHI (/hr):49.2  Supine AHI (/hr):51.2  Non-supine AHI (/hr): 0 Min O2 Sat (%):69.0  Mean O2 (%): 91.9  Time below 88% (min):16.9   Titration Optimal Pressure (cm):  AHI at Optimal Pressure (/hr):N/A  Min O2 at Optimal Pressure (%):88.0 Supine % at Optimal (%):N/A  Sleep % at Optimal (%):N/A   SLEEP ARCHITECTURE The recording time for the entire night was 433.9 minutes.  During a baseline period of 202.0 minutes, the patient slept for 140.5 minutes in REM and nonREM, yielding a sleep efficiency of 69.5%. Sleep onset after lights out was 35.2 minutes with a REM latency of 102.0 minutes. The patient spent 44.5% of the night in stage N1 sleep, 36.3% in stage  N2 sleep, 0.0% in stage N3 and 19.2% in REM.  During the titration period of 223.8 minutes, the patient slept for 120.5 minutes in REM and nonREM, yielding a sleep efficiency of 53.8%. Sleep onset after CPAP initiation was 19.1 minutes with a REM latency of 85.0 minutes. The patient spent 36.1% of the night in stage N1 sleep, 46.9% in stage N2 sleep, 0.0% in stage N3 and 17% in REM.  CARDIAC DATA The 2 lead EKG demonstrated atrial fibrillation. The mean heart rate was 100.0 beats per minute. Other EKG findings include: PVCs.  LEG MOVEMENT DATA The total Periodic Limb Movements of Sleep (PLMS) were 0. The PLMS index was 0.0 .  IMPRESSIONS - Severe obstructive sleep apnea occurred during the diagnostic portion of the study (AHI = 51.2/hour). An optimal PAP pressure could not be selected for this patient based on the available study data. - No significant central sleep apnea occurred during the diagnostic portion of the study (CAI = 0.0/hour). - Mild oxygen desaturation was noted during the diagnostic portion of the study (Min O2 = 69.0%). - The patient snored with moderate snoring volume during the diagnostic portion of the study. - EKG findings include PVCs. - Clinically significant periodic limb movements did not occur during sleep.  DIAGNOSIS - Obstructive Sleep Apnea (327.23 [G47.33 ICD-10]) - Nocturnal Hypoxemia  RECOMMENDATIONS - Recommend BiPAP titration given sub-optimal CPAP titration. - Avoid alcohol, sedatives and other CNS depressants that may worsen sleep apnea and disrupt normal sleep architecture. - Sleep hygiene should be reviewed to assess factors that may improve sleep quality. - Weight management and regular exercise should be initiated or continued.  [Electronically signed] 01/16/2020 07:37 PM  Darcy Barbara  Ciaran Begay MD, ABSM Diplomate, American Board of Sleep Medicine

## 2020-01-17 ENCOUNTER — Telehealth: Payer: Self-pay | Admitting: *Deleted

## 2020-01-17 DIAGNOSIS — Z8669 Personal history of other diseases of the nervous system and sense organs: Secondary | ICD-10-CM

## 2020-01-17 NOTE — Telephone Encounter (Addendum)
Informed patient of sleep study results and patient understanding was verbalized. Patient understands her sleep study showed they have sleep apnea and recommend BiPAP titration as she had unsuccessful CPAP titration due to severity of OSA. Please set up titration in the sleep lab. Pt is aware and agreeable to her results. Bipap titration sent to sleep pool.

## 2020-01-17 NOTE — Telephone Encounter (Signed)
-----   Message from [name], MD sent at 01/16/2020  7:40 PM EDT ----- Please let patient know that they have sleep apnea and recommend BiPAP titration as she had unsuccessful CPAP titration due to severity of OSA. Please set up titration in the sleep lab.

## 2020-01-19 ENCOUNTER — Telehealth: Payer: Self-pay | Admitting: *Deleted

## 2020-01-19 NOTE — Telephone Encounter (Signed)
[name], MD  You 57 minutes ago (1:53 PM)   Elizabeth Wang I am happy to talk with her about her sleep study and next steps. You can reassure her that I will just follow her sleep apnea   Traci    Informed the pt that Dr. Radford Pax said she would be glad to speak with her about her sleep results and next steps going forward.  Reassured the pt that Dr. Radford Pax will strictly follow her for Sleep and Dr. Meda Coffee will still remain her General Cardiologist.  Pt states she would love for Gae Bon to call her back and arrange for a sleep follow-up appt with Dr. Radford Pax to discuss this more in detail.  Pt states she would like a virtual appt for this.  Informed the pt that I will route this message to Gershon Cull Sleep Study Coordinator for Dr. Radford Pax, to call her back and arrange this appt. Pt verbalized understanding and agrees with this plan. Pt gracious for all the assistance provided.

## 2020-01-19 NOTE — Telephone Encounter (Signed)
FW: Visit Follow-Up Question Received: Today Message Contents  Freada Bergeron, CMA  Dorothy Spark, MD; Nuala Alpha, LPN  Called patient and made a virtual appt with Dr Radford Pax. Thanks

## 2020-01-19 NOTE — Telephone Encounter (Signed)
Follow Up   Patient is calling in regards to the mychart message that she sent in. Patient states she would like a call back today to discuss. Please give patient a call at [phone].

## 2020-01-19 NOTE — Telephone Encounter (Signed)

## 2020-01-24 ENCOUNTER — Telehealth (INDEPENDENT_AMBULATORY_CARE_PROVIDER_SITE_OTHER): Payer: Medicare Other | Admitting: Cardiology

## 2020-01-24 ENCOUNTER — Telehealth: Payer: Self-pay | Admitting: *Deleted

## 2020-01-24 ENCOUNTER — Encounter: Payer: Self-pay | Admitting: Cardiology

## 2020-01-24 VITALS — Ht 63.5 in | Wt 220.0 lb

## 2020-01-24 DIAGNOSIS — I4891 Unspecified atrial fibrillation: Secondary | ICD-10-CM

## 2020-01-24 DIAGNOSIS — Z6838 Body mass index (BMI) 38.0-38.9, adult: Secondary | ICD-10-CM

## 2020-01-24 DIAGNOSIS — Z7189 Other specified counseling: Secondary | ICD-10-CM

## 2020-01-24 DIAGNOSIS — Z86718 Personal history of other venous thrombosis and embolism: Secondary | ICD-10-CM

## 2020-01-24 DIAGNOSIS — Z87891 Personal history of nicotine dependence: Secondary | ICD-10-CM | POA: Diagnosis not present

## 2020-01-24 DIAGNOSIS — Z86711 Personal history of pulmonary embolism: Secondary | ICD-10-CM | POA: Diagnosis not present

## 2020-01-24 DIAGNOSIS — I10 Essential (primary) hypertension: Secondary | ICD-10-CM | POA: Diagnosis not present

## 2020-01-24 DIAGNOSIS — G4733 Obstructive sleep apnea (adult) (pediatric): Secondary | ICD-10-CM | POA: Diagnosis not present

## 2020-01-24 DIAGNOSIS — Z9989 Dependence on other enabling machines and devices: Secondary | ICD-10-CM | POA: Diagnosis not present

## 2020-01-24 DIAGNOSIS — G4736 Sleep related hypoventilation in conditions classified elsewhere: Secondary | ICD-10-CM | POA: Diagnosis not present

## 2020-01-24 NOTE — Progress Notes (Signed)
Virtual Visit via Telephone Note   This visit type was conducted due to national recommendations for restrictions regarding the COVID-19 Pandemic (e.g. social distancing) in an effort to limit this patient's exposure and mitigate transmission in our community.  Due to her co-morbid illnesses, this patient is at least at moderate risk for complications without adequate follow up.  This format is felt to be most appropriate for this patient at this time.  The patient did not have access to video technology/had technical difficulties with video requiring transitioning to audio format only (telephone).  All issues noted in this document were discussed and addressed.  No physical exam could be performed with this format.  Please refer to the patient's chart for her  consent to telehealth for [facility].  Evaluation Performed:  Follow-up visit  This visit type was conducted due to national recommendations for restrictions regarding the COVID-19 Pandemic (e.g. social distancing).  This format is felt to be most appropriate for this patient at this time.  All issues noted in this document were discussed and addressed.  No physical exam was performed (except for noted visual exam findings with Video Visits).  Please refer to the patient's chart (MyChart message for video visits and phone note for telephone visits) for the patient's consent to telehealth for [facility].  Date:  01/24/2020   ID:  Elizabeth Wang, DOB [birth year], MRN [id number]  Patient Location:  Home  Provider location:   [place]  PCP:  [name], MD  Cardiologist:  [name], MD  Sleep Medicine:  Fransico Him, MD Electrophysiologist:  None   Chief Complaint:  OSA  History of Present Illness:    Elizabeth Wang is a 74 y.o. female who presents via audio/video conferencing for a telehealth visit today.    This is a 74yo female with a hx of PE/DVT, PAF and HTN who was referred for evaluation of OSA by Dr. Meda Coffee.  The  patient was referred for sleep study due to high suspicion for OSA.  She has been known to snore and her husband has witnessed apneic episodes.  Her Epworth sleepiness score was low at 3.  She underwent sleep study showing severe OSA with an AHI of 51/hr and nocturnal hypoxemia with O2 sats at low as 69%.  She underwent CPAP titration but due to severity of OSA she was referred for BiPAP titration.  She is now here to discuss the results.    The patient does not have symptoms concerning for COVID-19 infection (fever, chills, cough, or new shortness of breath).    Prior CV studies:   The following studies were reviewed today:  Sleep study and CPAP titration  Past Medical History:  Diagnosis Date  . Acute pulmonary embolism ([place]) 06/2013  . Arthritis   . Asthma    years ago   . Clotting disorder (HCC)    DVT/PE  . Diverticulosis    minor   . Dyslipidemia   . Esophageal reflux   . Hypertension   . Hypothyroidism   . Numbness    TOES  . Obesity   . OSA (obstructive sleep apnea)    severe OSA with an AHI of 51/hr and nocturnal hypoxemia with O2 sats at low as 69%.  . Paroxysmal atrial fibrillation ([place])    1 isolated episode in the setting of acute stress with no further episodes.   . Pseudogout    Past Surgical History:  Procedure Laterality Date  . BREAST RECONSTRUCTION    .  CESAREAN SECTION    . COLONOSCOPY  2004   repeat in ten years  . KNEE ARTHROSCOPY Left   . TONSILLECTOMY    . 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 TOTAL KNEE ARTHROPLASTY Left 07/23/2014   Procedure: LEFT TOTAL KNEE ARTHROPLASTY;  Surgeon: [name], MD;  Location: [facility];  Service: Orthopedics;  Laterality: Left;  . tummy tuck  1987  . WISDOM TOOTH EXTRACTION       Current Meds  Medication Sig  . cetirizine (ZYRTEC ALLERGY) 10 MG tablet Take 10 mg by  mouth daily.  . colchicine 0.6 MG tablet Take 0.6 mg by mouth at bedtime. For gout   . diltiazem (CARDIZEM CD) 240 MG 24 hr capsule Take 1 capsule (240 mg total) by mouth daily.  [name] EPINEPHrine (EPIPEN) 0.3 mg/0.3 mL SOAJ injection Inject 0.3 mg into the muscle as needed for anaphylaxis (Allergic Reaction).   . fluticasone (FLONASE) 50 MCG/ACT nasal spray Place 1-2 sprays into both nostrils daily.  [name] gabapentin (NEURONTIN) 300 MG capsule Take 300 mg by mouth daily as needed (pain.).   [name] hydrochlorothiazide (HYDRODIURIL) 25 MG tablet TAKE ONE TABLET DAILY  . levothyroxine (SYNTHROID, LEVOTHROID) 125 MCG tablet Take 125 mcg by mouth daily before breakfast. For thyroid therapy  . ondansetron (ZOFRAN-ODT) 4 MG disintegrating tablet Take 4 mg by mouth 3 (three) times daily as needed (nausea/vomiting). For N&V  . rosuvastatin (CRESTOR) 10 MG tablet TAKE ONE TABLET EACH DAY  . telmisartan (MICARDIS) 80 MG tablet Take 80 mg by mouth daily.  [name] tobramycin (TOBREX) 0.3 % ophthalmic solution Place 1 drop into the left eye every 2 (two) hours as needed (stye).   . valACYclovir (VALTREX) 1000 MG tablet Take 1,000 mg by mouth daily as needed.  Alveda Reasons 20 MG TABS tablet TAKE ONE TABLET EACH DAY WITH SUPPER  . zolpidem (AMBIEN) 5 MG tablet Take 5 mg by mouth at bedtime as needed for sleep.     Allergies:   Nsaids and Codeine   Social History   Tobacco Use  . Smoking status: Former Smoker    Packs/day: 0.25    Years: 12.00    Pack years: 3.00    Quit date: 12/28/1975    Years since quitting: 44.1  . Smokeless tobacco: Never Used  Substance Use Topics  . Alcohol use: Yes    Alcohol/week: 4.0 standard drinks    Types: 4 Glasses of wine per week    Comment: socially  . Drug use: No     Family Hx: The patient's family history includes CVA in her mother; Coronary artery disease in her father; Heart disease in her father; Hypertension in her father.  ROS:   Please see the history of present illness.      All other systems reviewed and are negative.   Labs/Other Tests and Data Reviewed:    Recent Labs: 12/01/2019: ALT 35; BUN 21; Creatinine, Ser 1.14; Hemoglobin 13.9; Platelets 204; Potassium 3.3; Sodium 138; TSH 1.150   Recent Lipid Panel Lab Results  Component Value Date/Time   CHOL 138 12/01/2019 03:29 PM   TRIG 168 (H) 12/01/2019 03:29 PM   HDL 46 12/01/2019 03:29 PM   CHOLHDL 3.0 12/01/2019 03:29 PM   LDLCALC 64 12/01/2019 03:29 PM    Wt Readings from Last 3 Encounters:  01/24/20 220 lb (99.8 kg)  01/14/20 220 lb (99.8 kg)  12/01/19 228 lb 9.6 oz (103.7 kg)     Objective:    Vital Signs:  Ht 5' 3.5" (1.613 m)   Wt 220 lb (99.8 kg)   LMP  (LMP Unknown)   BMI 38.36 kg/m     ASSESSMENT & PLAN:    1.  OSA -severe OSA with an AHI of 51/hr and nocturnal hypoxemia with O2 sats at low as 69%. -we discussed the results of the sleep study and CPAP titration -we discussed the pathophysiology of OSA and possible detrimental effects such as CHF, CVA, CAD, pulmonary HTN, DM and HTN.  -we discussed the difference in CPAP and BIPAP and why she would best be suited for BiPAP -will set up in lab BiPAP titration  2.  HTN -continue Cardizem CD 240mg  daily, HCTZ 25mg  daily and Telmisartan 80mg  daily  3.  Morbid Obesity -weight loss will help with decreasing episodes of afib as well as OSA  COVID-19 Education: The signs and symptoms of COVID-19 were discussed with the patient and how to seek care for testing (follow up with PCP or arrange E-visit).  The importance of social distancing was discussed today.  Patient Risk:   After full review of this patient's clinical status, I feel that they are at least moderate risk at this time.  Time:   Today, I have spent 20 minutes on telemedicine discussing medical problems including OSA, obesity and HTN and reviewing patient's chart including sleep study and CPAP titration.  Medication Adjustments/Labs and Tests Ordered: Current  medicines are reviewed at length with the patient today.  Concerns regarding medicines are outlined above.  Tests Ordered: No orders of the defined types were placed in this encounter.  Medication Changes: No orders of the defined types were placed in this encounter.   Disposition:  Follow up prn after BiPAP titration  Signed, Fransico Him, MD  01/24/2020 10:21 AM    [facility]

## 2020-01-24 NOTE — Addendum Note (Signed)
Addended by: Freada Bergeron on: 01/24/2020 02:48 PM   Modules accepted: Orders

## 2020-01-24 NOTE — Telephone Encounter (Signed)
Patient is scheduled for BPAP Titration on 02/19/20. PT is scheduled for COVID screening on 02/16/20 prior to titration.  Patient understands her titration study will be done at [facility] sleep lab. Patient understands she will receive a letter in a week or so detailing appointment, date, time, and location. Patient understands to call if she does not receive the letter  in a timely manner. Patient agrees with treatment and thanked me for call.

## 2020-01-24 NOTE — Telephone Encounter (Signed)
Staff message sent to Elizabeth Wang ok to schedule BIPAP titration. His insurance does not require a Precert.

## 2020-01-30 ENCOUNTER — Other Ambulatory Visit (HOSPITAL_COMMUNITY)
Admission: RE | Admit: 2020-01-30 | Discharge: 2020-01-30 | Disposition: A | Discharge status: Home / Self Care | Payer: Medicare Other | Source: Ambulatory Visit | Attending: Cardiology | Admitting: Cardiology

## 2020-01-30 DIAGNOSIS — Z01812 Encounter for preprocedural laboratory examination: Secondary | ICD-10-CM | POA: Diagnosis not present

## 2020-01-30 DIAGNOSIS — Z20822 Contact with and (suspected) exposure to covid-19: Secondary | ICD-10-CM | POA: Insufficient documentation

## 2020-01-30 LAB — SARS CORONAVIRUS 2 (TAT 6-24 HRS): SARS Coronavirus 2: NEGATIVE

## 2020-02-01 ENCOUNTER — Other Ambulatory Visit: Payer: Self-pay

## 2020-02-01 ENCOUNTER — Ambulatory Visit (HOSPITAL_BASED_OUTPATIENT_CLINIC_OR_DEPARTMENT_OTHER): Payer: Medicare Other | Attending: Cardiology | Admitting: Cardiology

## 2020-02-01 VITALS — Temp 97.1°F | Ht 63.0 in | Wt 220.0 lb

## 2020-02-01 DIAGNOSIS — R0902 Hypoxemia: Secondary | ICD-10-CM | POA: Diagnosis not present

## 2020-02-01 DIAGNOSIS — G4733 Obstructive sleep apnea (adult) (pediatric): Secondary | ICD-10-CM | POA: Diagnosis not present

## 2020-02-01 DIAGNOSIS — I4891 Unspecified atrial fibrillation: Secondary | ICD-10-CM | POA: Insufficient documentation

## 2020-02-02 ENCOUNTER — Other Ambulatory Visit (HOSPITAL_BASED_OUTPATIENT_CLINIC_OR_DEPARTMENT_OTHER): Payer: Self-pay

## 2020-02-02 DIAGNOSIS — Z8669 Personal history of other diseases of the nervous system and sense organs: Secondary | ICD-10-CM

## 2020-02-06 NOTE — Procedures (Signed)
   Patient Name: Elizabeth Wang, Elizabeth Wang Date: 02/01/2020 Gender: Female D.O.B: [birth year] Age (years): 89 Referring Provider: Fransico Him MD, ABSM Height (inches): 63 Interpreting Physician: Fransico Him MD, ABSM Weight (lbs): 220 RPSGT: Zadie Rhine BMI: 39 MRN: [id number] Neck Size: 15.50  CLINICAL INFORMATION The patient is referred for a BiPAP titration to treat sleep apnea.  SLEEP STUDY TECHNIQUE As per the AASM Manual for the Scoring of Sleep and Associated Events v2.3 (April 2016) with a hypopnea requiring 4% desaturations.  The channels recorded and monitored were frontal, central and occipital EEG, electrooculogram (EOG), submentalis EMG (chin), nasal and oral airflow, thoracic and abdominal wall motion, anterior tibialis EMG, snore microphone, electrocardiogram, and pulse oximetry. Bilevel positive airway pressure (BPAP) was initiated at the beginning of the study and titrated to treat sleep-disordered breathing.  MEDICATIONS Medications self-administered by patient taken the night of the study : N/A  RESPIRATORY PARAMETERS Optimal IPAP Pressure (cm): 19  AHI at Optimal Pressure (/hr) 0.0 Optimal EPAP Pressure (cm):15  Overall Minimal O2 (%):60.0  Minimal O2 at Optimal Pressure (%): 94.0  SLEEP ARCHITECTURE Start Time:10:22:38 PM  Stop Time:4:16:43 AM  Total Time (min):354.1  Total Sleep Time (min):277.5 Sleep Latency (min):16.8  Sleep Efficiency (%):78.4%  REM Latency (min):92.0  WASO (min): 59.8 Stage N1 (%): 9.0% Stage N2 (%): 53.5%  Stage N3 (%): 21.1%  Stage R (%):16.4 Supine (%):100.00  Arousal Index (/hr):19.0   CARDIAC DATA The 2 lead EKG demonstrated atrial fibrillation. The mean heart rate was 68.6 beats per minute. Other EKG findings include: None.  LEG MOVEMENT DATA The total Periodic Limb Movements of Sleep (PLMS) were 0. The PLMS index was 0.0. A PLMS index of <15 is considered normal in adults.  IMPRESSIONS - An optimal PAP pressure was  selected for this patient ( 19 / cm of water) - Central sleep apnea was not noted during this titration (CAI = 1.5/h). - Severe oxygen desaturations were observed during this titration (min O2 = 60.0%). - No snoring was audible during this study. - Atrial Fibrillation was observed during this study. - Clinically significant periodic limb movements were not noted during this study. Arousals associated with PLMs were rare.  DIAGNOSIS - Obstructive Sleep Apnea (327.23 [G47.33 ICD-10]) - Nocturnal Hypoxemia - Atrial Fibrillation  RECOMMENDATIONS - Trial of Auto BiPAP therapy with IPAP max of 20cm H2O and EPAP min 5cm H2O and PS at 4cm H2O with a Small size Resmed Full Face Mask AirFit F30i mask and heated humidification. - Avoid alcohol, sedatives and other CNS depressants that may worsen sleep apnea and disrupt normal sleep architecture. - Sleep hygiene should be reviewed to assess factors that may improve sleep quality. - Weight management and regular exercise should be initiated or continued. - Return to Sleep Center for re-evaluation after 8 weeks of therapy  [Electronically signed] 02/06/2020 03:55 PM  Fransico Him MD, ABSM Diplomate, American Board of Sleep Medicine

## 2020-02-08 ENCOUNTER — Telehealth: Payer: Self-pay | Admitting: *Deleted

## 2020-02-08 NOTE — Telephone Encounter (Signed)
Informed patient of sleep study results and patient understanding was verbalized. Patient understands her sleep study showed they had a successful PAP titration and let DME know that orders are in EPIC. Please set up 8 week OV with me.  Pt is aware and agreeable to her results.  Upon patient request DME selection is [facility]. Patient understands she will be contacted by [facility] to set up her cpap. Patient understands to call if [facility] does not contact her with new setup in a timely manner. Patient understands they will be called once confirmation has been received from [facility] that they have received their new machine to schedule 10 week follow up appointment.  [facility] notified of new cpap order  Please add to airview Patient was grateful for the call and thanked me.    

## 2020-02-08 NOTE — Telephone Encounter (Signed)
-----   Message from [name], MD sent at 02/06/2020  4:01 PM EDT ----- Please let patient know that they had a successful PAP titration and let DME know that orders are in EPIC.  Please set up 8 week OV with me.

## 2020-02-14 ENCOUNTER — Telehealth: Payer: BLUE CROSS/BLUE SHIELD | Admitting: Cardiology

## 2020-02-16 ENCOUNTER — Other Ambulatory Visit (HOSPITAL_COMMUNITY): Payer: BLUE CROSS/BLUE SHIELD

## 2020-02-19 ENCOUNTER — Encounter (HOSPITAL_BASED_OUTPATIENT_CLINIC_OR_DEPARTMENT_OTHER): Payer: BLUE CROSS/BLUE SHIELD | Admitting: Cardiology

## 2020-03-04 ENCOUNTER — Other Ambulatory Visit: Payer: Self-pay | Admitting: Cardiology

## 2020-03-04 DIAGNOSIS — E782 Mixed hyperlipidemia: Secondary | ICD-10-CM

## 2020-03-04 DIAGNOSIS — I10 Essential (primary) hypertension: Secondary | ICD-10-CM

## 2020-03-04 DIAGNOSIS — I480 Paroxysmal atrial fibrillation: Secondary | ICD-10-CM

## 2020-03-06 ENCOUNTER — Ambulatory Visit (INDEPENDENT_AMBULATORY_CARE_PROVIDER_SITE_OTHER): Payer: Medicare Other | Admitting: Cardiology

## 2020-03-06 ENCOUNTER — Encounter: Payer: Self-pay | Admitting: Cardiology

## 2020-03-06 ENCOUNTER — Other Ambulatory Visit: Payer: Self-pay

## 2020-03-06 VITALS — BP 110/64 | HR 94 | Ht 63.0 in | Wt 225.0 lb

## 2020-03-06 DIAGNOSIS — I480 Paroxysmal atrial fibrillation: Secondary | ICD-10-CM

## 2020-03-06 DIAGNOSIS — Z7901 Long term (current) use of anticoagulants: Secondary | ICD-10-CM | POA: Diagnosis not present

## 2020-03-06 DIAGNOSIS — I10 Essential (primary) hypertension: Secondary | ICD-10-CM

## 2020-03-06 DIAGNOSIS — G4733 Obstructive sleep apnea (adult) (pediatric): Secondary | ICD-10-CM

## 2020-03-06 DIAGNOSIS — E782 Mixed hyperlipidemia: Secondary | ICD-10-CM | POA: Diagnosis not present

## 2020-03-06 DIAGNOSIS — S61216A Laceration without foreign body of right little finger without damage to nail, initial encounter: Secondary | ICD-10-CM | POA: Diagnosis not present

## 2020-03-06 NOTE — Patient Instructions (Signed)
Medication Instructions:   Your physician recommends that you continue on your current medications as directed. Please refer to the Current Medication list given to you today.  *If you need a refill on your cardiac medications before your next appointment, please call your pharmacy*   Follow-Up:    3 MONTHS WITH DR. Meda Coffee ON Thursday June 20, 2020 AT 2:40 PM.  THIS IS AN IN PERSON VISIT.   Other Instructions  PLEASE GIVE [place] A CALL WHEN YOU ARE READY TO SCHEDULE YOUR CARDIOVERSION

## 2020-03-06 NOTE — Progress Notes (Signed)
Cardiology Office Note:    ID:  Elizabeth Wang, Elizabeth Wang Nov 29, 1945, MRN [id number]  PCP:  [name], MD  Cardiologist:  [name], MD   Referring MD: [name], MD   Chief complain: Atrial fibrillation  History of Present Illness:    Elizabeth Wang is a 74 y.o. female with a hx of paroxysmal atrial fibrillation (1 episode in the setting of acute distress and 2014), hypertension, right leg DVT and PE in 06/2013 (provoked by travel and dehydration) .  At that time she only had one isolated episode of A. fib and she was only placed on aspirin.  The patient had an echocardiogram in 06/2013 that showed normal LVEF, trivial AR, moderately to severely calcified mitral annulus.  She was seen in the afib clinic for evaluation for continuation of afib symptoms. Kardia strips are reviewed showing evidence of paroxysmal afib. She is now back on xarelto for a chadsvasc score of at least 3.  12/01/2019 -the patient is coming after 6 months, she has been doing great, she remains very busy and has no symptoms of chest pain or shortness of breath.  She received her first dose of COVID-19 vaccine on January 22 and 2 days later noticed on her apple watch that she is in atrial fibrillation.  Since then she has received several alerts from the watch that she is in A. fib however she does not feel any palpitation dizziness or syncope.  She has been very compliant with her Eliquis and has no bleeding.The patient states that her husband observed apneic episodes and snoring.  03/06/2020 -the patient is coming after 3 months, she received both Covid vaccines, she remains in atrial fibrillation that is rate controlled.  She does not feel palpitations, no orthopnea paroxysmal nocturnal dyspnea.  No palpitation dizziness or syncope.  She has been compliant with Xarelto with no missed doses.  Unfortunately she fell in the parking lot today and developed abrasions on her hands that are currently oozing blood.  She was  diagnosed with severe sleep apnea and started on BiPAP machine that she just started the last week.  Past Medical History:  Diagnosis Date  . Acute pulmonary embolism ([place]) 06/2013  . Arthritis   . Asthma    years ago   . Clotting disorder (HCC)    DVT/PE  . Diverticulosis    minor   . Dyslipidemia   . Esophageal reflux   . Hypertension   . Hypothyroidism   . Numbness    TOES  . Obesity   . OSA (obstructive sleep apnea)    severe OSA with an AHI of 51/hr and nocturnal hypoxemia with O2 sats at low as 69%.  . Paroxysmal atrial fibrillation ([place])    1 isolated episode in the setting of acute stress with no further episodes.   . Pseudogout     Past Surgical History:  Procedure Laterality Date  . BREAST RECONSTRUCTION    . CESAREAN SECTION    . COLONOSCOPY  2004   repeat in ten years  . KNEE ARTHROSCOPY Left   . TONSILLECTOMY    . 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 TOTAL KNEE ARTHROPLASTY Left 07/23/2014   Procedure: LEFT TOTAL KNEE ARTHROPLASTY;  Surgeon: [name], MD;  Location: [facility];  Service: Orthopedics;  Laterality: Left;  . tummy tuck  1987  . WISDOM TOOTH  EXTRACTION      Current Medications: Current Meds  Medication Sig  . cetirizine (ZYRTEC ALLERGY) 10 MG tablet Take 10 mg by mouth daily.  . colchicine 0.6 MG tablet Take 0.6 mg by mouth at bedtime. For gout   . diltiazem (CARDIZEM CD) 240 MG 24 hr capsule Take 1 capsule (240 mg total) by mouth daily.  [name] EPINEPHrine (EPIPEN) 0.3 mg/0.3 mL SOAJ injection Inject 0.3 mg into the muscle as needed for anaphylaxis (Allergic Reaction).   . fluticasone (FLONASE) 50 MCG/ACT nasal spray Place 1-2 sprays into both nostrils daily.  [name] gabapentin (NEURONTIN) 300 MG capsule Take 300 mg by mouth daily as needed (pain.).   [name] hydrochlorothiazide (HYDRODIURIL) 25 MG tablet TAKE ONE  TABLET DAILY  . levothyroxine (SYNTHROID, LEVOTHROID) 125 MCG tablet Take 125 mcg by mouth daily before breakfast. For thyroid therapy  . ondansetron (ZOFRAN-ODT) 4 MG disintegrating tablet Take 4 mg by mouth 3 (three) times daily as needed (nausea/vomiting). For N&V  . rosuvastatin (CRESTOR) 10 MG tablet TAKE ONE TABLET DAILY  . telmisartan (MICARDIS) 80 MG tablet Take 80 mg by mouth daily.  [name] tobramycin (TOBREX) 0.3 % ophthalmic solution Place 1 drop into the left eye every 2 (two) hours as needed (stye).   . valACYclovir (VALTREX) 1000 MG tablet Take 1,000 mg by mouth daily as needed.  Alveda Reasons 20 MG TABS tablet TAKE ONE TABLET EACH DAY WITH SUPPER  . zolpidem (AMBIEN) 5 MG tablet Take 5 mg by mouth at bedtime as needed for sleep.     Allergies:   Nsaids and Codeine   Social History   Socioeconomic History  . Marital status: Married    Spouse name: Not on file  . Number of children: Not on file  . Years of education: Not on file  . Highest education level: Not on file  Occupational History  . Occupation: Music therapist  Tobacco Use  . Smoking status: Former Smoker    Packs/day: 0.25    Years: 12.00    Pack years: 3.00    Quit date: 12/28/1975    Years since quitting: 44.2  . Smokeless tobacco: Never Used  Substance and Sexual Activity  . Alcohol use: Yes    Alcohol/week: 4.0 standard drinks    Types: 4 Glasses of wine per week    Comment: socially  . Drug use: No  . Sexual activity: Not on file  Other Topics Concern  . Not on file  Social History Narrative   Lives in [place] with her husband. Exercises at the gym 4 times a week.   Social Determinants of Health   Financial Resource Strain:   . Difficulty of Paying Living Expenses:   Food Insecurity:   . Worried About Charity fundraiser in the Last Year:   . Arboriculturist in the Last Year:   Transportation Needs:   . Film/video editor (Medical):   [name] Lack of Transportation (Non-Medical):   Physical  Activity:   . Days of Exercise per Week:   . Minutes of Exercise per Session:   Stress:   . Feeling of Stress :   Social Connections:   . Frequency of Communication with Friends and Family:   . Frequency of Social Gatherings with Friends and Family:   . Attends Religious Services:   . Active Member of Clubs or Organizations:   . Attends Archivist Meetings:   [name] Marital Status:      Family History: The  patient's family history includes CVA in her mother; Coronary artery disease in her father; Heart disease in her father; Hypertension in her father. ROS:   Please see the history of present illness.     All other systems reviewed and are negative.  EKGs/Labs/Other Studies Reviewed:    The following studies were reviewed today:  EKG:  EKG is  ordered today.  The ekg ordered today demonstrates atrial fibrillation with 80 bpm, this is changed from the last EKG that showed normal sinus rhythm.  This was personally reviewed.  Recent Labs: 12/01/2019: ALT 35; BUN 21; Creatinine, Ser 1.14; Hemoglobin 13.9; Platelets 204; Potassium 3.3; Sodium 138; TSH 1.150  Recent Lipid Panel    Component Value Date/Time   CHOL 138 12/01/2019 1529   TRIG 168 (H) 12/01/2019 1529   HDL 46 12/01/2019 1529   CHOLHDL 3.0 12/01/2019 1529   LDLCALC 64 12/01/2019 1529    Physical Exam:    VS:  BP 110/64   Pulse 94   Ht 5\' 3"  (1.6 m)   Wt 225 lb (102.1 kg)   LMP  (LMP Unknown)   SpO2 95%   BMI 39.86 kg/m     Wt Readings from Last 3 Encounters:  03/06/20 225 lb (102.1 kg)  02/01/20 220 lb (99.8 kg)  01/24/20 220 lb (99.8 kg)     GEN:  Well nourished, well developed in no acute distress HEENT: Normal NECK: No JVD; No carotid bruits LYMPHATICS: No lymphadenopathy CARDIAC: RRR, no murmurs, rubs, gallops RESPIRATORY:  Clear to auscultation without rales, wheezing or rhonchi  ABDOMEN: Soft, non-tender, non-distended MUSCULOSKELETAL:  No edema; No deformity  SKIN: Warm and  dry NEUROLOGIC:  Alert and oriented x 3 PSYCHIATRIC:  Normal affect   TTE: 05/14/17 Left ventricle: The cavity size was normal. Wall thickness was   normal. Systolic function was normal. The estimated ejection   fraction was in the range of 60% to 65%. Wall motion was normal;   there were no regional wall motion abnormalities. Doppler   parameters are consistent with abnormal left ventricular   relaxation (grade 1 diastolic dysfunction). - Aortic valve: There was mild stenosis. Valve area (VTI): 3.19   cm^2. Valve area (Vmax): 2.84 cm^2. Valve area (Vmean): 2.62   cm^2. - Mitral valve: Moderately calcified annulus. There was mild   regurgitation. Valve area by continuity equation (using LVOT   flow): 6.98 cm^2. - Left atrium: The atrium was moderately dilated.  EKG 01/13/2018 shows sinus rhythm with first-degree AV block otherwise normal EKG unchanged from prior.    ASSESSMENT:    1. PAF (paroxysmal atrial fibrillation) ([place])   2. Essential hypertension   3. Chronic anticoagulation   4. Mixed hyperlipidemia   5. OSA (obstructive sleep apnea)     PLAN:    In order of problems listed above:  1. Paroxysmal symptomatic afib Echo in 2018 showed LVEF 60-65% with moderately dilated left atrium.  She is now back in atrial fibrillation that is rate controlled, this was possibly triggered by COVID-19 vaccine.  She is not symptomatic.  She continues to take Xarelto, her hemoglobin was 13.9 in February 2021.  We will make a plan of her feeling comfortable with her new BiPAP machine, and then schedule her for outpatient cardioversion.  She has not missed any Xarelto doses.  2. HTN  Well-controlled on current regimen  3. Hyperlipidemia, she tolerates Crestor well, LDL and HDL are at goal, triglycerides 168 she is advised about limiting carbs and snacks.  4.  Obstructive sleep apnea -severe just started on BiPAP.  She follows with Dr. Yong Channel.  We will schedule cardioversion once the  patient call us back.  Follow-up in 3 months.  Medication Adjustments/Labs and Tests Ordered: Current medicines are reviewed at length with the patient today.  Concerns regarding medicines are outlined above.  Orders Placed This Encounter  Procedures  . EKG 12-Lead   No orders of the defined types were placed in this encounter.  Signed, [name], MD  03/06/2020 10:46 AM    Elizabeth Wang

## 2020-03-07 ENCOUNTER — Telehealth: Payer: Self-pay | Admitting: *Deleted

## 2020-03-07 MED ORDER — CEPHALEXIN 250 MG PO CAPS: 250.0000 mg | ORAL_CAPSULE | Freq: Two times a day (BID) | ORAL | 0 refills | Status: DC

## 2020-03-07 NOTE — Telephone Encounter (Signed)
-----   Message from Dorothy Spark, MD sent at 03/07/2020  1:49 PM EDT ----- Please send prescription to pharmacy for Keflex 250 mg p.o. twice daily for 3 days.

## 2020-03-07 NOTE — Telephone Encounter (Signed)
Prescription for Keflex 250 mg po bid take for 3 days only, was sent to her confirmed pharmacy of choice.   Dr. Meda Coffee made the pt aware of sent prescription, via telephone call she placed to the pt.  Pt verbalized understanding and agrees with this plan.

## 2020-03-18 ENCOUNTER — Telehealth: Payer: Self-pay | Admitting: *Deleted

## 2020-03-18 NOTE — Telephone Encounter (Signed)
Patient has a 10 week follow up appointment scheduled for 04/10/20 9 AM. Patient understands she needs to keep this appointment for insurance compliance. Patient was grateful for the call and thanked me.

## 2020-03-18 NOTE — Telephone Encounter (Signed)
  Patient Consent for Virtual Visit         Elizabeth Wang has provided verbal consent on 03/18/2020 for a virtual visit (video or telephone).   CONSENT FOR VIRTUAL VISIT FOR:  Elizabeth Wang  By participating in this virtual visit I agree to the following:  I hereby voluntarily request, consent and authorize [facility] and its employed or contracted physicians, physician assistants, nurse practitioners or other licensed health care professionals (the Practitioner), to provide me with telemedicine health care services (the "Services") as deemed necessary by the treating Practitioner. I acknowledge and consent to receive the Services by the Practitioner via telemedicine. I understand that the telemedicine visit will involve communicating with the Practitioner through live audiovisual communication technology and the disclosure of certain medical information by electronic transmission. I acknowledge that I have been given the opportunity to request an in-person assessment or other available alternative prior to the telemedicine visit and am voluntarily participating in the telemedicine visit.  I understand that I have the right to withhold or withdraw my consent to the use of telemedicine in the course of my care at any time, without affecting my right to future care or treatment, and that the Practitioner or I may terminate the telemedicine visit at any time. I understand that I have the right to inspect all information obtained and/or recorded in the course of the telemedicine visit and may receive copies of available information for a reasonable fee.  I understand that some of the potential risks of receiving the Services via telemedicine include:  [name] Delay or interruption in medical evaluation due to technological equipment failure or disruption; . Information transmitted may not be sufficient (e.g. poor resolution of images) to allow for appropriate medical decision making by the Practitioner; and/or    . In rare instances, security protocols could fail, causing a breach of personal health information.  Furthermore, I acknowledge that it is my responsibility to provide information about my medical history, conditions and care that is complete and accurate to the best of my ability. I acknowledge that Practitioner's advice, recommendations, and/or decision may be based on factors not within their control, such as incomplete or inaccurate data provided by me or distortions of diagnostic images or specimens that may result from electronic transmissions. I understand that the practice of medicine is not an exact science and that Practitioner makes no warranties or guarantees regarding treatment outcomes. I acknowledge that a copy of this consent can be made available to me via my patient portal ([facility]), or I can request a printed copy by calling the office of [facility].    I understand that my insurance will be billed for this visit.   I have read or had this consent read to me. . I understand the contents of this consent, which adequately explains the benefits and risks of the Services being provided via telemedicine.  . I have been provided ample opportunity to ask questions regarding this consent and the Services and have had my questions answered to my satisfaction. . I give my informed consent for the services to be provided through the use of telemedicine in my medical care

## 2020-04-09 DIAGNOSIS — R7301 Impaired fasting glucose: Secondary | ICD-10-CM | POA: Diagnosis not present

## 2020-04-09 DIAGNOSIS — E038 Other specified hypothyroidism: Secondary | ICD-10-CM | POA: Diagnosis not present

## 2020-04-09 DIAGNOSIS — E7849 Other hyperlipidemia: Secondary | ICD-10-CM | POA: Diagnosis not present

## 2020-04-09 NOTE — Progress Notes (Signed)
Virtual Visit via Video Note   This visit type was conducted due to national recommendations for restrictions regarding the COVID-19 Pandemic (e.g. social distancing) in an effort to limit this patient's exposure and mitigate transmission in our community.  Due to her co-morbid illnesses, this patient is at least at moderate risk for complications without adequate follow up.  This format is felt to be most appropriate for this patient at this time.  All issues noted in this document were discussed and addressed.  A limited physical exam was performed with this format.  Please refer to the patient's chart for her consent to telehealth for [facility].   Evaluation Performed:  Follow-up visit  This visit type was conducted due to national recommendations for restrictions regarding the COVID-19 Pandemic (e.g. social distancing).  This format is felt to be most appropriate for this patient at this time.  All issues noted in this document were discussed and addressed.  No physical exam was performed (except for noted visual exam findings with Video Visits).  Please refer to the patient's chart (MyChart message for video visits and phone note for telephone visits) for the patient's consent to telehealth for [facility].  Date:  04/09/2020   ID:  Elizabeth Wang, DOB 1946/02/03, MRN [id number]  Patient Location:  Home  Provider location:   [place]  PCP:  [name], MD  Cardiologist:  [name], MD  Electrophysiologist:  None   Chief Complaint:  OSA  History of Present Illness:    Elizabeth Wang is a 74 y.o. female who presents via audio/video conferencing for a telehealth visit today.    This is a 74yo female with a hx of PAF, HTN and DVT/PE.  She recently had a reoccurence of her afib after receiving the COVID 19 vaccine.  She has been anticoagulated with Xarelto.  She was referred for a split night sleep study which showed severe OSA with an AHI of 51.2/hr, no central apneas and  severe nocturnal hypoxemia with O2 sats as low as 69%.  She was unable to be titrated on CPAP due to ongoing respiratory events and underwent BIPAP and is now on auto BiPAP.    SHe is doing well with her BiPAP device and thinks that she has gotten used to it.  She tolerates the mask and feels the pressure is adequate.  Since going on BiPAP She feels rested in the am and has no significant daytime sleepiness.  She denies any significant mouth or nasal dryness or nasal congestion.  She does not think that he snores.    The patient does not have symptoms concerning for COVID-19 infection (fever, chills, cough, or new shortness of breath).    Prior CV studies:   The following studies were reviewed today:  Split night sleep study, BiPAP titration and PAP compliance download  Past Medical History:  Diagnosis Date  . Acute pulmonary embolism ([place]) 06/2013  . Arthritis   . Asthma    years ago   . Clotting disorder (HCC)    DVT/PE  . Diverticulosis    minor   . Dyslipidemia   . Esophageal reflux   . Hypertension   . Hypothyroidism   . Numbness    TOES  . Obesity   . OSA (obstructive sleep apnea)    severe OSA with an AHI of 51/hr and nocturnal hypoxemia with O2 sats at low as 69%.  . Paroxysmal atrial fibrillation ([place])    1 isolated episode in the setting of  acute stress with no further episodes.   . Pseudogout    Past Surgical History:  Procedure Laterality Date  . BREAST RECONSTRUCTION    . CESAREAN SECTION    . COLONOSCOPY  2004   repeat in ten years  . KNEE ARTHROSCOPY Left   . TONSILLECTOMY    . 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 TOTAL KNEE ARTHROPLASTY Left 07/23/2014   Procedure: LEFT TOTAL KNEE ARTHROPLASTY;  Surgeon: [name], MD;  Location: [facility];  Service: Orthopedics;  Laterality: Left;  . tummy tuck  1987  . WISDOM  TOOTH EXTRACTION       No outpatient medications have been marked as taking for the 04/10/20 encounter (Appointment) with [name], MD.     Allergies:   Nsaids and Codeine   Social History   Tobacco Use  . Smoking status: Former Smoker    Packs/day: 0.25    Years: 12.00    Pack years: 3.00    Quit date: 12/28/1975    Years since quitting: 44.3  . Smokeless tobacco: Never Used  Substance Use Topics  . Alcohol use: Yes    Alcohol/week: 4.0 standard drinks    Types: 4 Glasses of wine per week    Comment: socially  . Drug use: No     Family Hx: The patient's family history includes CVA in her mother; Coronary artery disease in her father; Heart disease in her father; Hypertension in her father.  ROS:   Please see the history of present illness.     All other systems reviewed and are negative.   Labs/Other Tests and Data Reviewed:    Recent Labs: 12/01/2019: ALT 35; BUN 21; Creatinine, Ser 1.14; Hemoglobin 13.9; Platelets 204; Potassium 3.3; Sodium 138; TSH 1.150   Recent Lipid Panel Lab Results  Component Value Date/Time   CHOL 138 12/01/2019 03:29 PM   TRIG 168 (H) 12/01/2019 03:29 PM   HDL 46 12/01/2019 03:29 PM   CHOLHDL 3.0 12/01/2019 03:29 PM   LDLCALC 64 12/01/2019 03:29 PM    Wt Readings from Last 3 Encounters:  03/06/20 225 lb (102.1 kg)  02/01/20 220 lb (99.8 kg)  01/24/20 220 lb (99.8 kg)     Objective:    Vital Signs:  LMP  (LMP Unknown)    CONSTITUTIONAL:  Well nourished, well developed female in no acute distress.  EYES: anicteric MOUTH: oral mucosa is pink RESPIRATORY: Normal respiratory effort, symmetric expansion CARDIOVASCULAR: No peripheral edema SKIN: No rash, lesions or ulcers MUSCULOSKELETAL: no digital cyanosis NEURO: Cranial Nerves II-XII grossly intact, moves all extremities PSYCH: Intact judgement and insight.  A&O x 3, Mood/affect appropriate   ASSESSMENT & PLAN:    1.  OSA - The pathophysiology of obstructive sleep  apnea , it's cardiovascular consequences & modes of treatment including CPAP were discused with the patient in detail & they evidenced understanding.  The patient is tolerating PAP therapy well without any problems. The PAP download was reviewed today and showed an AHI of 3.9/hr on Auto BiPAP with 77% compliance in using more than 4 hours nightly.  The patient has been using and benefiting from PAP use and will continue to benefit from therapy.   2.  HTN -BP controlled -continue HCTZ 25mg  daily, Cardizem CD 240mg  daily, Telmisartan 80mg  daily  3.  Morbid Obesity -I have encouraged her to  get into a routine exercise program and cut back on carbs and portions.   Time:   Today, I have spent 20 minutes on telemedicine discussing medical problems including OSA, HTN and morbid obesity and reviewing patient's chart including Split night sleep study, BiPAP titration and PAP compliance download.  Medication Adjustments/Labs and Tests Ordered: Current medicines are reviewed at length with the patient today.  Concerns regarding medicines are outlined above.  Tests Ordered: No orders of the defined types were placed in this encounter.  Medication Changes: No orders of the defined types were placed in this encounter.   Disposition:  Follow up in 1 year(s)  Signed, Fransico Him, MD  04/09/2020 11:02 PM    [place]

## 2020-04-10 ENCOUNTER — Encounter: Payer: Self-pay | Admitting: Cardiology

## 2020-04-10 ENCOUNTER — Telehealth (INDEPENDENT_AMBULATORY_CARE_PROVIDER_SITE_OTHER): Payer: Medicare Other | Admitting: Cardiology

## 2020-04-10 VITALS — Ht 63.0 in | Wt 225.0 lb

## 2020-04-10 DIAGNOSIS — G4733 Obstructive sleep apnea (adult) (pediatric): Secondary | ICD-10-CM | POA: Diagnosis not present

## 2020-04-10 DIAGNOSIS — I10 Essential (primary) hypertension: Secondary | ICD-10-CM | POA: Diagnosis not present

## 2020-04-10 NOTE — Patient Instructions (Signed)

## 2020-04-17 ENCOUNTER — Other Ambulatory Visit: Payer: Self-pay | Admitting: Cardiology

## 2020-04-17 DIAGNOSIS — I10 Essential (primary) hypertension: Secondary | ICD-10-CM

## 2020-04-17 DIAGNOSIS — E782 Mixed hyperlipidemia: Secondary | ICD-10-CM

## 2020-04-17 DIAGNOSIS — I480 Paroxysmal atrial fibrillation: Secondary | ICD-10-CM

## 2020-05-02 ENCOUNTER — Other Ambulatory Visit: Payer: Self-pay | Admitting: Internal Medicine

## 2020-05-02 DIAGNOSIS — N63 Unspecified lump in unspecified breast: Secondary | ICD-10-CM

## 2020-05-10 ENCOUNTER — Ambulatory Visit
Admission: RE | Admit: 2020-05-10 | Discharge: 2020-05-10 | Disposition: A | Discharge status: Home / Self Care | Payer: Medicare Other | Source: Ambulatory Visit | Attending: Internal Medicine | Admitting: Internal Medicine

## 2020-05-10 ENCOUNTER — Other Ambulatory Visit: Payer: Self-pay

## 2020-05-10 DIAGNOSIS — N63 Unspecified lump in unspecified breast: Secondary | ICD-10-CM

## 2020-05-10 DIAGNOSIS — S2001XA Contusion of right breast, initial encounter: Secondary | ICD-10-CM | POA: Diagnosis not present

## 2020-05-10 MED ORDER — GADOBUTROL 1 MMOL/ML IV SOLN
10.0000 mL | Freq: Once | INTRAVENOUS | Status: AC | PRN
  Administered 2020-05-10: 10 mL via INTRAVENOUS

## 2020-06-04 ENCOUNTER — Other Ambulatory Visit: Payer: Self-pay | Admitting: Cardiology

## 2020-06-04 NOTE — Telephone Encounter (Signed)
Xarelto 20mg  refill request received. Pt is 74 years old, weight-102.1kg, Crea-1.14 on 12/01/2019, last seen by Dr. Radford Pax via Telemedicine on 04/10/2020 and Dr. Meda Coffee 03/06/2020, Diagnosis-Afib, CrCl-69.69ml/min; Dose is appropriate based on dosing criteria. Will send in refill to requested pharmacy.

## 2020-06-20 ENCOUNTER — Other Ambulatory Visit: Payer: Self-pay

## 2020-06-20 ENCOUNTER — Encounter: Payer: Self-pay | Admitting: Cardiology

## 2020-06-20 ENCOUNTER — Ambulatory Visit (INDEPENDENT_AMBULATORY_CARE_PROVIDER_SITE_OTHER): Payer: Medicare Other | Admitting: Cardiology

## 2020-06-20 VITALS — BP 116/70 | HR 104 | Ht 64.0 in | Wt 228.4 lb

## 2020-06-20 DIAGNOSIS — Z23 Encounter for immunization: Secondary | ICD-10-CM | POA: Diagnosis not present

## 2020-06-20 DIAGNOSIS — I480 Paroxysmal atrial fibrillation: Secondary | ICD-10-CM | POA: Diagnosis not present

## 2020-06-20 DIAGNOSIS — I4891 Unspecified atrial fibrillation: Secondary | ICD-10-CM | POA: Diagnosis not present

## 2020-06-20 DIAGNOSIS — Z01812 Encounter for preprocedural laboratory examination: Secondary | ICD-10-CM

## 2020-06-20 DIAGNOSIS — Z7901 Long term (current) use of anticoagulants: Secondary | ICD-10-CM

## 2020-06-20 DIAGNOSIS — G4733 Obstructive sleep apnea (adult) (pediatric): Secondary | ICD-10-CM

## 2020-06-20 NOTE — Progress Notes (Signed)
Cardiology Office Note:    ID:  Elizabeth Wang, Elizabeth Wang 1946-10-01, MRN [id number]  PCP:  [name], MD  Cardiologist:  [name], MD   Referring MD: [name], MD   Reason for visit: Follow-up for atrial fibrillation  History of Present Illness:    Elizabeth Wang is a 74 y.o. female with a hx of paroxysmal atrial fibrillation (1 episode in the setting of acute distress and 2014), hypertension, right leg DVT and PE in 06/2013 (provoked by travel and dehydration) .  At that time she only had one isolated episode of A. fib and she was only placed on aspirin. The patient had an echocardiogram in 06/2013 that showed normal LVEF, trivial AR, moderately to severely calcified mitral annulus. The patient was doing well until February 2021 when she received Covid vaccine and we will up atrial fibrillation again her CHA2DS2-VASc score is 3, she was started on Xarelto.    She is coming today, she just received booster vaccine for Covid, she has been feeling great, spend most of the summer at the [place], not limited by shortness of breath or chest pain, her rates are well controlled, she has no bleeding on Xarelto.  She has been diagnosed with obstructive sleep apnea and Dr. Radford Pax is monitoring her parameters.  She is planning to have breast implants removed in the near future.  Past Medical History:  Diagnosis Date  . Acute pulmonary embolism ([place]) 06/2013  . Arthritis   . Asthma    years ago   . Clotting disorder (HCC)    DVT/PE  . Diverticulosis    minor   . Dyslipidemia   . Esophageal reflux   . Hypertension   . Hypothyroidism   . Numbness    TOES  . Obesity   . OSA (obstructive sleep apnea)    severe OSA with an AHI of 51/hr and nocturnal hypoxemia with O2 sats at low as 69%.  . Paroxysmal atrial fibrillation ([place])    1 isolated episode in the setting of acute stress with no further episodes.   . Pseudogout     Past Surgical History:  Procedure Laterality Date  . BREAST  RECONSTRUCTION    . CESAREAN SECTION    . COLONOSCOPY  2004   repeat in ten years  . KNEE ARTHROSCOPY Left   . TONSILLECTOMY    . 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 TOTAL KNEE ARTHROPLASTY Left 07/23/2014   Procedure: LEFT TOTAL KNEE ARTHROPLASTY;  Surgeon: [name], MD;  Location: [facility];  Service: Orthopedics;  Laterality: Left;  . tummy tuck  1987  . WISDOM TOOTH EXTRACTION      Current Medications: Current Meds  Medication Sig  . cetirizine (ZYRTEC ALLERGY) 10 MG tablet Take 10 mg by mouth daily.  . colchicine 0.6 MG tablet Take 0.6 mg by mouth at bedtime. For gout   . diltiazem (CARDIZEM CD) 240 MG 24 hr capsule Take 1 capsule (240 mg total) by mouth daily.  [name] EPINEPHrine (EPIPEN) 0.3 mg/0.3 mL SOAJ injection Inject 0.3 mg into the muscle as needed for anaphylaxis (Allergic Reaction).   . fluticasone (FLONASE) 50 MCG/ACT nasal spray Place 1-2 sprays into both nostrils as needed.   . gabapentin (NEURONTIN) 300 MG capsule Take 300 mg by mouth daily as needed (pain.).   [name] hydrochlorothiazide (HYDRODIURIL) 25 MG tablet TAKE ONE  TABLET DAILY  . levothyroxine (SYNTHROID, LEVOTHROID) 125 MCG tablet Take 125 mcg by mouth daily before breakfast. For thyroid therapy  . ondansetron (ZOFRAN-ODT) 4 MG disintegrating tablet Take 4 mg by mouth 3 (three) times daily as needed (nausea/vomiting). For N&V  . rosuvastatin (CRESTOR) 10 MG tablet TAKE ONE TABLET DAILY  . telmisartan (MICARDIS) 80 MG tablet Take 80 mg by mouth daily.  [name] tobramycin (TOBREX) 0.3 % ophthalmic solution Place 1 drop into the left eye every 2 (two) hours as needed (stye).   . valACYclovir (VALTREX) 1000 MG tablet Take 1,000 mg by mouth daily as needed.   Alveda Reasons 20 MG TABS tablet TAKE ONE TABLET EACH DAY WITH SUPPER  . zolpidem (AMBIEN) 5 MG tablet Take 5 mg by mouth at  bedtime as needed for sleep.     Allergies:   Nsaids and Codeine   Social History   Socioeconomic History  . Marital status: Married    Spouse name: Not on file  . Number of children: Not on file  . Years of education: Not on file  . Highest education level: Not on file  Occupational History  . Occupation: Music therapist  Tobacco Use  . Smoking status: Former Smoker    Packs/day: 0.25    Years: 12.00    Pack years: 3.00    Quit date: 12/28/1975    Years since quitting: 44.5  . Smokeless tobacco: Never Used  Substance and Sexual Activity  . Alcohol use: Yes    Alcohol/week: 4.0 standard drinks    Types: 4 Glasses of wine per week    Comment: socially  . Drug use: No  . Sexual activity: Not on file  Other Topics Concern  . Not on file  Social History Narrative   Lives in [place] with her husband. Exercises at the gym 4 times a week.   Social Determinants of Health   Financial Resource Strain:   . Difficulty of Paying Living Expenses: Not on file  Food Insecurity:   . Worried About Charity fundraiser in the Last Year: Not on file  . Ran Out of Food in the Last Year: Not on file  Transportation Needs:   . Lack of Transportation (Medical): Not on file  . Lack of Transportation (Non-Medical): Not on file  Physical Activity:   . Days of Exercise per Week: Not on file  . Minutes of Exercise per Session: Not on file  Stress:   . Feeling of Stress : Not on file  Social Connections:   . Frequency of Communication with Friends and Family: Not on file  . Frequency of Social Gatherings with Friends and Family: Not on file  . Attends Religious Services: Not on file  . Active Member of Clubs or Organizations: Not on file  . Attends Archivist Meetings: Not on file  . Marital Status: Not on file   Family History: The patient's family history includes CVA in her mother; Coronary artery disease in her father; Heart disease in her father; Hypertension in her  father. ROS:   Please see the history of present illness.     All other systems reviewed and are negative.  EKGs/Labs/Other Studies Reviewed:    The following studies were reviewed today:  EKG:  EKG is  ordered today.  The ekg ordered today demonstrates atrial fibrillation with 91 bpm, this is changed from the last EKG that showed normal sinus rhythm.  This was personally reviewed.  Recent Labs: 12/01/2019: ALT  35; BUN 21; Creatinine, Ser 1.14; Hemoglobin 13.9; Platelets 204; Potassium 3.3; Sodium 138; TSH 1.150  Recent Lipid Panel    Component Value Date/Time   CHOL 138 12/01/2019 1529   TRIG 168 (H) 12/01/2019 1529   HDL 46 12/01/2019 1529   CHOLHDL 3.0 12/01/2019 1529   LDLCALC 64 12/01/2019 1529    Physical Exam:    VS:  BP 116/70   Pulse (!) 104   Ht 5\' 4"  (1.626 m)   Wt 228 lb 6.4 oz (103.6 kg)   LMP  (LMP Unknown)   SpO2 98%   BMI 39.20 kg/m     Wt Readings from Last 3 Encounters:  06/20/20 228 lb 6.4 oz (103.6 kg)  04/10/20 225 lb (102.1 kg)  03/06/20 225 lb (102.1 kg)     GEN:  Well nourished, well developed in no acute distress HEENT: Normal NECK: No JVD; No carotid bruits LYMPHATICS: No lymphadenopathy CARDIAC: RRR, no murmurs, rubs, gallops RESPIRATORY:  Clear to auscultation without rales, wheezing or rhonchi  ABDOMEN: Soft, non-tender, non-distended MUSCULOSKELETAL:  No edema; No deformity  SKIN: Warm and dry NEUROLOGIC:  Alert and oriented x 3 PSYCHIATRIC:  Normal affect   TTE: 05/14/17 Left ventricle: The cavity size was normal. Wall thickness was   normal. Systolic function was normal. The estimated ejection   fraction was in the range of 60% to 65%. Wall motion was normal;   there were no regional wall motion abnormalities. Doppler   parameters are consistent with abnormal left ventricular   relaxation (grade 1 diastolic dysfunction). - Aortic valve: There was mild stenosis. Valve area (VTI): 3.19   cm^2. Valve area (Vmax): 2.84 cm^2. Valve  area (Vmean): 2.62   cm^2. - Mitral valve: Moderately calcified annulus. There was mild   regurgitation. Valve area by continuity equation (using LVOT   flow): 6.98 cm^2. - Left atrium: The atrium was moderately dilated.  EKG 01/13/2018 shows sinus rhythm with first-degree AV block otherwise normal EKG unchanged from prior.    ASSESSMENT:    1. Pre-procedure lab exam   2. OSA (obstructive sleep apnea)   3. PAF (paroxysmal atrial fibrillation) ([place])   4. Chronic anticoagulation   5. Atrial fibrillation, unspecified type ([place])     PLAN:    In order of problems listed above:  1.  Chronic atrial fibrillation -since February 2021.  Echo in 2018 showed LVEF 60-65% with moderately dilated left atrium.  We will schedule a cardioversion for next week.  She was previously able to remain in sinus rhythm until her A. fib was triggered by Covid vaccine.  For the purposes of breast surgery, we will hold her Xarelto 2 days prior to the surgery in 2 days post surgery, this should not be scheduled earlier than 4 weeks post cardioversion.  2. HTN  Well-controlled on current regimen  3. Hyperlipidemia, she tolerates Crestor well, LDL and HDL are at goal, triglycerides 168 she is advised about limiting carbs and snacks.  4.  Obstructive sleep apnea -severe just started on BiPAP.  She follows with Dr. Radford Pax  Follow-up in 3 months.  Medication Adjustments/Labs and Tests Ordered: Current medicines are reviewed at length with the patient today.  Concerns regarding medicines are outlined above.  Orders Placed This Encounter  Procedures  . Basic metabolic panel  . CBC  . EKG 12-Lead   No orders of the defined types were placed in this encounter.  Signed, [name], MD  06/20/2020 5:17 PM    [facility]

## 2020-06-20 NOTE — Patient Instructions (Addendum)
Medication Instructions:   Your physician recommends that you continue on your current medications as directed. Please refer to the Current Medication list given to you today.  *If you need a refill on your cardiac medications before your next appointment, please call your pharmacy*  Lab Work:  TODAY--BMET AND CBC  If you have labs (blood work) drawn today and your tests are completely normal, you will receive your results only by: [name] MyChart Message (if you have MyChart) OR . A paper copy in the mail If you have any lab test that is abnormal or we need to change your treatment, we will call you to review the results.  Testing/Procedures:  You are scheduled for a  Cardioversion on 06/27/20 with Dr. Marlou Porch.  Please arrive at the [facility] (Main Entrance A) at [facility]: [address] [place], [place] [address] at 9 am. (1 hour prior to procedure unless lab work is needed; if lab work is needed arrive 1.5 hours ahead)  DIET: Nothing to eat or drink after midnight except a sip of water with medications (see medication instructions below)  Medication Instructions: Hold YOUR HYDROCHLOROTHIAZIDE THE MORNING OF THIS PROCEDURE  Continue your anticoagulant: XARELTO You will need to continue your anticoagulant after your procedure until you are told by your Provider that it is safe to stop  Come to LAB: TODAY FOR BMET AND CBC   You must have a responsible person to drive you home and stay in the waiting area during your procedure. Failure to do so could result in cancellation.  Bring your insurance cards.  *Special Note: Every effort is made to have your procedure done on time. Occasionally there are emergencies that occur at the hospital that may cause delays. Please be patient if a delay does occur.   Due to recent COVID-19 restrictions implemented by our local and state authorities and in an effort to keep both patients and staff as safe as possible, our hospital system requires  COVID-19 testing prior to certain scheduled hospital procedures.  Please go to [facility]. [place], [place] [address] on 06/25/20 at 1:25 PM  .  This is a drive up testing site.  You will not need to exit your vehicle.  You will not be billed at the time of testing but may receive a bill later depending on your insurance.  The approximate cost of the test is $100.  You must agree to self-quarantine from the time of your testing until the procedure date on 06/27/20.  This should included staying home with ONLY the people you live with.  Avoid take-out, grocery store shopping or leaving the house for any non-emergent reason.  Failure to have your COVID-19 test done on the date and time you have been scheduled will result in cancellation of your procedure.  Please call our office at [phone] if you have any questions.    Follow-Up:  4 MONTHS OR AROUND THAT TIME IN THE OFFICE WITH DR. Meda Coffee

## 2020-06-20 NOTE — H&P (View-Only) (Signed)
Cardiology Office Note:    ID:  Elizabeth Wang, Elizabeth Wang 25-Aug-1946, MRN [id number]  PCP:  [name], MD  Cardiologist:  [name], MD   Referring MD: [name], MD   Reason for visit: Follow-up for atrial fibrillation  History of Present Illness:    Elizabeth Wang is a 74 y.o. female with a hx of paroxysmal atrial fibrillation (1 episode in the setting of acute distress and 2014), hypertension, right leg DVT and PE in 06/2013 (provoked by travel and dehydration) .  At that time she only had one isolated episode of A. fib and she was only placed on aspirin. The patient had an echocardiogram in 06/2013 that showed normal LVEF, trivial AR, moderately to severely calcified mitral annulus. The patient was doing well until February 2021 when she received Covid vaccine and we will up atrial fibrillation again her CHA2DS2-VASc score is 3, she was started on Xarelto.    She is coming today, she just received booster vaccine for Covid, she has been feeling great, spend most of the summer at the [place], not limited by shortness of breath or chest pain, her rates are well controlled, she has no bleeding on Xarelto.  She has been diagnosed with obstructive sleep apnea and Dr. Radford Pax is monitoring her parameters.  She is planning to have breast implants removed in the near future.  Past Medical History:  Diagnosis Date  . Acute pulmonary embolism ([place]) 06/2013  . Arthritis   . Asthma    years ago   . Clotting disorder (HCC)    DVT/PE  . Diverticulosis    minor   . Dyslipidemia   . Esophageal reflux   . Hypertension   . Hypothyroidism   . Numbness    TOES  . Obesity   . OSA (obstructive sleep apnea)    severe OSA with an AHI of 51/hr and nocturnal hypoxemia with O2 sats at low as 69%.  . Paroxysmal atrial fibrillation ([place])    1 isolated episode in the setting of acute stress with no further episodes.   . Pseudogout     Past Surgical History:  Procedure Laterality Date  . BREAST  RECONSTRUCTION    . CESAREAN SECTION    . COLONOSCOPY  2004   repeat in ten years  . KNEE ARTHROSCOPY Left   . TONSILLECTOMY    . 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 TOTAL KNEE ARTHROPLASTY Left 07/23/2014   Procedure: LEFT TOTAL KNEE ARTHROPLASTY;  Surgeon: [name], MD;  Location: [facility];  Service: Orthopedics;  Laterality: Left;  . tummy tuck  1987  . WISDOM TOOTH EXTRACTION      Current Medications: Current Meds  Medication Sig  . cetirizine (ZYRTEC ALLERGY) 10 MG tablet Take 10 mg by mouth daily.  . colchicine 0.6 MG tablet Take 0.6 mg by mouth at bedtime. For gout   . diltiazem (CARDIZEM CD) 240 MG 24 hr capsule Take 1 capsule (240 mg total) by mouth daily.  [name] EPINEPHrine (EPIPEN) 0.3 mg/0.3 mL SOAJ injection Inject 0.3 mg into the muscle as needed for anaphylaxis (Allergic Reaction).   . fluticasone (FLONASE) 50 MCG/ACT nasal spray Place 1-2 sprays into both nostrils as needed.   . gabapentin (NEURONTIN) 300 MG capsule Take 300 mg by mouth daily as needed (pain.).   [name] hydrochlorothiazide (HYDRODIURIL) 25 MG tablet TAKE ONE  TABLET DAILY  . levothyroxine (SYNTHROID, LEVOTHROID) 125 MCG tablet Take 125 mcg by mouth daily before breakfast. For thyroid therapy  . ondansetron (ZOFRAN-ODT) 4 MG disintegrating tablet Take 4 mg by mouth 3 (three) times daily as needed (nausea/vomiting). For N&V  . rosuvastatin (CRESTOR) 10 MG tablet TAKE ONE TABLET DAILY  . telmisartan (MICARDIS) 80 MG tablet Take 80 mg by mouth daily.  [name] tobramycin (TOBREX) 0.3 % ophthalmic solution Place 1 drop into the left eye every 2 (two) hours as needed (stye).   . valACYclovir (VALTREX) 1000 MG tablet Take 1,000 mg by mouth daily as needed.   Alveda Reasons 20 MG TABS tablet TAKE ONE TABLET EACH DAY WITH SUPPER  . zolpidem (AMBIEN) 5 MG tablet Take 5 mg by mouth at  bedtime as needed for sleep.     Allergies:   Nsaids and Codeine   Social History   Socioeconomic History  . Marital status: Married    Spouse name: Not on file  . Number of children: Not on file  . Years of education: Not on file  . Highest education level: Not on file  Occupational History  . Occupation: Music therapist  Tobacco Use  . Smoking status: Former Smoker    Packs/day: 0.25    Years: 12.00    Pack years: 3.00    Quit date: 12/28/1975    Years since quitting: 44.5  . Smokeless tobacco: Never Used  Substance and Sexual Activity  . Alcohol use: Yes    Alcohol/week: 4.0 standard drinks    Types: 4 Glasses of wine per week    Comment: socially  . Drug use: No  . Sexual activity: Not on file  Other Topics Concern  . Not on file  Social History Narrative   Lives in [place] with her husband. Exercises at the gym 4 times a week.   Social Determinants of Health   Financial Resource Strain:   . Difficulty of Paying Living Expenses: Not on file  Food Insecurity:   . Worried About Charity fundraiser in the Last Year: Not on file  . Ran Out of Food in the Last Year: Not on file  Transportation Needs:   . Lack of Transportation (Medical): Not on file  . Lack of Transportation (Non-Medical): Not on file  Physical Activity:   . Days of Exercise per Week: Not on file  . Minutes of Exercise per Session: Not on file  Stress:   . Feeling of Stress : Not on file  Social Connections:   . Frequency of Communication with Friends and Family: Not on file  . Frequency of Social Gatherings with Friends and Family: Not on file  . Attends Religious Services: Not on file  . Active Member of Clubs or Organizations: Not on file  . Attends Archivist Meetings: Not on file  . Marital Status: Not on file   Family History: The patient's family history includes CVA in her mother; Coronary artery disease in her father; Heart disease in her father; Hypertension in her  father. ROS:   Please see the history of present illness.     All other systems reviewed and are negative.  EKGs/Labs/Other Studies Reviewed:    The following studies were reviewed today:  EKG:  EKG is  ordered today.  The ekg ordered today demonstrates atrial fibrillation with 91 bpm, this is changed from the last EKG that showed normal sinus rhythm.  This was personally reviewed.  Recent Labs: 12/01/2019: ALT  35; BUN 21; Creatinine, Ser 1.14; Hemoglobin 13.9; Platelets 204; Potassium 3.3; Sodium 138; TSH 1.150  Recent Lipid Panel    Component Value Date/Time   CHOL 138 12/01/2019 1529   TRIG 168 (H) 12/01/2019 1529   HDL 46 12/01/2019 1529   CHOLHDL 3.0 12/01/2019 1529   LDLCALC 64 12/01/2019 1529    Physical Exam:    VS:  BP 116/70   Pulse (!) 104   Ht 5\' 4"  (1.626 m)   Wt 228 lb 6.4 oz (103.6 kg)   LMP  (LMP Unknown)   SpO2 98%   BMI 39.20 kg/m     Wt Readings from Last 3 Encounters:  06/20/20 228 lb 6.4 oz (103.6 kg)  04/10/20 225 lb (102.1 kg)  03/06/20 225 lb (102.1 kg)     GEN:  Well nourished, well developed in no acute distress HEENT: Normal NECK: No JVD; No carotid bruits LYMPHATICS: No lymphadenopathy CARDIAC: RRR, no murmurs, rubs, gallops RESPIRATORY:  Clear to auscultation without rales, wheezing or rhonchi  ABDOMEN: Soft, non-tender, non-distended MUSCULOSKELETAL:  No edema; No deformity  SKIN: Warm and dry NEUROLOGIC:  Alert and oriented x 3 PSYCHIATRIC:  Normal affect   TTE: 05/14/17 Left ventricle: The cavity size was normal. Wall thickness was   normal. Systolic function was normal. The estimated ejection   fraction was in the range of 60% to 65%. Wall motion was normal;   there were no regional wall motion abnormalities. Doppler   parameters are consistent with abnormal left ventricular   relaxation (grade 1 diastolic dysfunction). - Aortic valve: There was mild stenosis. Valve area (VTI): 3.19   cm^2. Valve area (Vmax): 2.84 cm^2. Valve  area (Vmean): 2.62   cm^2. - Mitral valve: Moderately calcified annulus. There was mild   regurgitation. Valve area by continuity equation (using LVOT   flow): 6.98 cm^2. - Left atrium: The atrium was moderately dilated.  EKG 01/13/2018 shows sinus rhythm with first-degree AV block otherwise normal EKG unchanged from prior.    ASSESSMENT:    1. Pre-procedure lab exam   2. OSA (obstructive sleep apnea)   3. PAF (paroxysmal atrial fibrillation) ([place])   4. Chronic anticoagulation   5. Atrial fibrillation, unspecified type ([place])     PLAN:    In order of problems listed above:  1.  Chronic atrial fibrillation -since February 2021.  Echo in 2018 showed LVEF 60-65% with moderately dilated left atrium.  We will schedule a cardioversion for next week.  She was previously able to remain in sinus rhythm until her A. fib was triggered by Covid vaccine.  For the purposes of breast surgery, we will hold her Xarelto 2 days prior to the surgery in 2 days post surgery, this should not be scheduled earlier than 4 weeks post cardioversion.  2. HTN  Well-controlled on current regimen  3. Hyperlipidemia, she tolerates Crestor well, LDL and HDL are at goal, triglycerides 168 she is advised about limiting carbs and snacks.  4.  Obstructive sleep apnea -severe just started on BiPAP.  She follows with Dr. Radford Pax  Follow-up in 3 months.  Medication Adjustments/Labs and Tests Ordered: Current medicines are reviewed at length with the patient today.  Concerns regarding medicines are outlined above.  Orders Placed This Encounter  Procedures  . Basic metabolic panel  . CBC  . EKG 12-Lead   No orders of the defined types were placed in this encounter.  Signed, [name], MD  06/20/2020 5:17 PM    [facility]

## 2020-06-21 LAB — BASIC METABOLIC PANEL
BUN/Creatinine Ratio: 23 (ref 12–28)
BUN: 28 mg/dL — ABNORMAL HIGH (ref 8–27)
CO2: 25 mmol/L (ref 20–29)
Calcium: 9.6 mg/dL (ref 8.7–10.3)
Chloride: 94 mmol/L — ABNORMAL LOW (ref 96–106)
Creatinine, Ser: 1.22 mg/dL — ABNORMAL HIGH (ref 0.57–1.00)
GFR calc Af Amer: 50 mL/min/{1.73_m2} — ABNORMAL LOW (ref >59)
GFR calc non Af Amer: 44 mL/min/{1.73_m2} — ABNORMAL LOW (ref >59)
Glucose: 94 mg/dL (ref 65–99)
Potassium: 3.7 mmol/L (ref 3.5–5.2)
Sodium: 134 mmol/L (ref 134–144)

## 2020-06-21 LAB — CBC
Hematocrit: 38.9 % (ref 34.0–46.6)
Hemoglobin: 13.5 g/dL (ref 11.1–15.9)
MCH: 32.8 pg (ref 26.6–33.0)
MCHC: 34.7 g/dL (ref 31.5–35.7)
MCV: 95 fL (ref 79–97)
Platelets: 180 10*3/uL (ref 150–450)
RBC: 4.11 x10E6/uL (ref 3.77–5.28)
RDW: 12.7 % (ref 11.7–15.4)
WBC: 7.3 10*3/uL (ref 3.4–10.8)

## 2020-06-24 ENCOUNTER — Other Ambulatory Visit: Payer: Self-pay | Admitting: Cardiology

## 2020-06-24 DIAGNOSIS — I10 Essential (primary) hypertension: Secondary | ICD-10-CM

## 2020-06-24 DIAGNOSIS — E782 Mixed hyperlipidemia: Secondary | ICD-10-CM

## 2020-06-24 DIAGNOSIS — I480 Paroxysmal atrial fibrillation: Secondary | ICD-10-CM

## 2020-06-25 ENCOUNTER — Other Ambulatory Visit (HOSPITAL_COMMUNITY)
Admission: RE | Admit: 2020-06-25 | Discharge: 2020-06-25 | Disposition: A | Discharge status: Home / Self Care | Payer: Medicare Other | Source: Ambulatory Visit | Attending: Cardiology | Admitting: Cardiology

## 2020-06-25 DIAGNOSIS — Z20822 Contact with and (suspected) exposure to covid-19: Secondary | ICD-10-CM | POA: Insufficient documentation

## 2020-06-25 DIAGNOSIS — Z01812 Encounter for preprocedural laboratory examination: Secondary | ICD-10-CM | POA: Diagnosis not present

## 2020-06-25 LAB — SARS CORONAVIRUS 2 (TAT 6-24 HRS): SARS Coronavirus 2: NEGATIVE

## 2020-06-26 MED ORDER — DILTIAZEM HCL ER COATED BEADS 240 MG PO CP24: 240.0000 mg | ORAL_CAPSULE | Freq: Every day | ORAL | 3 refills | Status: DC

## 2020-06-26 NOTE — Progress Notes (Signed)
Pre op call complete for endo procedure tomorrow 9/2. Patient states she has been quarantined since COVID test, has not missed any doses of her blood thinner, and states she has a ride home post procedure. All questions addressed.

## 2020-06-27 ENCOUNTER — Ambulatory Visit (HOSPITAL_COMMUNITY): Payer: Medicare Other | Admitting: Certified Registered Nurse Anesthetist

## 2020-06-27 ENCOUNTER — Encounter (HOSPITAL_COMMUNITY)
Admission: RE | Disposition: A | Discharge status: Home / Self Care | Payer: Self-pay | Source: Home / Self Care | Attending: Cardiology

## 2020-06-27 ENCOUNTER — Other Ambulatory Visit: Payer: Self-pay

## 2020-06-27 ENCOUNTER — Ambulatory Visit (HOSPITAL_COMMUNITY)
Admission: RE | Admit: 2020-06-27 | Discharge: 2020-06-27 | Disposition: A | Discharge status: Home / Self Care | Payer: Medicare Other | Attending: Cardiology | Admitting: Cardiology

## 2020-06-27 ENCOUNTER — Encounter (HOSPITAL_COMMUNITY): Payer: Self-pay | Admitting: Cardiology

## 2020-06-27 DIAGNOSIS — Z87891 Personal history of nicotine dependence: Secondary | ICD-10-CM | POA: Insufficient documentation

## 2020-06-27 DIAGNOSIS — Z79899 Other long term (current) drug therapy: Secondary | ICD-10-CM | POA: Diagnosis not present

## 2020-06-27 DIAGNOSIS — J45909 Unspecified asthma, uncomplicated: Secondary | ICD-10-CM | POA: Insufficient documentation

## 2020-06-27 DIAGNOSIS — Z96653 Presence of artificial knee joint, bilateral: Secondary | ICD-10-CM | POA: Insufficient documentation

## 2020-06-27 DIAGNOSIS — E039 Hypothyroidism, unspecified: Secondary | ICD-10-CM | POA: Diagnosis not present

## 2020-06-27 DIAGNOSIS — Z8249 Family history of ischemic heart disease and other diseases of the circulatory system: Secondary | ICD-10-CM | POA: Diagnosis not present

## 2020-06-27 DIAGNOSIS — I4819 Other persistent atrial fibrillation: Secondary | ICD-10-CM

## 2020-06-27 DIAGNOSIS — Z86711 Personal history of pulmonary embolism: Secondary | ICD-10-CM | POA: Insufficient documentation

## 2020-06-27 DIAGNOSIS — I10 Essential (primary) hypertension: Secondary | ICD-10-CM | POA: Diagnosis not present

## 2020-06-27 DIAGNOSIS — G4733 Obstructive sleep apnea (adult) (pediatric): Secondary | ICD-10-CM | POA: Insufficient documentation

## 2020-06-27 DIAGNOSIS — E785 Hyperlipidemia, unspecified: Secondary | ICD-10-CM | POA: Diagnosis not present

## 2020-06-27 DIAGNOSIS — K219 Gastro-esophageal reflux disease without esophagitis: Secondary | ICD-10-CM | POA: Insufficient documentation

## 2020-06-27 DIAGNOSIS — E669 Obesity, unspecified: Secondary | ICD-10-CM | POA: Insufficient documentation

## 2020-06-27 DIAGNOSIS — I480 Paroxysmal atrial fibrillation: Secondary | ICD-10-CM | POA: Insufficient documentation

## 2020-06-27 DIAGNOSIS — Z7901 Long term (current) use of anticoagulants: Secondary | ICD-10-CM | POA: Insufficient documentation

## 2020-06-27 DIAGNOSIS — Z886 Allergy status to analgesic agent status: Secondary | ICD-10-CM | POA: Insufficient documentation

## 2020-06-27 DIAGNOSIS — Z86718 Personal history of other venous thrombosis and embolism: Secondary | ICD-10-CM | POA: Insufficient documentation

## 2020-06-27 DIAGNOSIS — E876 Hypokalemia: Secondary | ICD-10-CM | POA: Diagnosis not present

## 2020-06-27 DIAGNOSIS — Z885 Allergy status to narcotic agent status: Secondary | ICD-10-CM | POA: Diagnosis not present

## 2020-06-27 DIAGNOSIS — Z6839 Body mass index (BMI) 39.0-39.9, adult: Secondary | ICD-10-CM | POA: Diagnosis not present

## 2020-06-27 DIAGNOSIS — M1990 Unspecified osteoarthritis, unspecified site: Secondary | ICD-10-CM | POA: Diagnosis not present

## 2020-06-27 HISTORY — PX: CARDIOVERSION: SHX1299

## 2020-06-27 SURGERY — CARDIOVERSION
Anesthesia: General

## 2020-06-27 MED ORDER — SODIUM CHLORIDE 0.9 % IV SOLN
INTRAVENOUS | Status: AC | PRN
  Administered 2020-06-27: 500 mL via INTRAVENOUS

## 2020-06-27 MED ORDER — LIDOCAINE 2% (20 MG/ML) 5 ML SYRINGE
INTRAMUSCULAR | Status: DC | PRN
  Administered 2020-06-27: 100 mg via INTRAVENOUS

## 2020-06-27 MED ORDER — PROPOFOL 10 MG/ML IV BOLUS
INTRAVENOUS | Status: DC | PRN
  Administered 2020-06-27: 100 mg via INTRAVENOUS

## 2020-06-27 NOTE — Anesthesia Procedure Notes (Signed)
Procedure Name: General with mask airway Date/Time: 06/27/2020 10:11 AM Performed by: Alain Marion, CRNA Pre-anesthesia Checklist: Patient identified, Emergency Drugs available, Suction available and Patient being monitored Oxygen Delivery Method: Ambu bag Preoxygenation: Pre-oxygenation with 100% oxygen Induction Type: IV induction Placement Confirmation: positive ETCO2

## 2020-06-27 NOTE — Anesthesia Preprocedure Evaluation (Signed)
Anesthesia Evaluation  Patient identified by MRN, date of birth, ID band Patient awake    Reviewed: Allergy & Precautions, H&P , NPO status , Patient's Chart, lab work & pertinent test results  Airway Mallampati: II   Neck ROM: full    Dental   Pulmonary asthma , sleep apnea , former smoker,    breath sounds clear to auscultation       Cardiovascular hypertension, + dysrhythmias Atrial Fibrillation  Rhythm:irregular Rate:Normal     Neuro/Psych    GI/Hepatic GERD  ,  Endo/Other  Hypothyroidism Morbid obesity  Renal/GU      Musculoskeletal  (+) Arthritis ,   Abdominal   Peds  Hematology   Anesthesia Other Findings   Reproductive/Obstetrics                             Anesthesia Physical Anesthesia Plan  ASA: III  Anesthesia Plan: General   Post-op Pain Management:    Induction: Intravenous  PONV Risk Score and Plan: 2 and Propofol infusion and Treatment may vary due to age or medical condition  Airway Management Planned: Mask  Additional Equipment:   Intra-op Plan:   Post-operative Plan:   Informed Consent: I have reviewed the patients History and Physical, chart, labs and discussed the procedure including the risks, benefits and alternatives for the proposed anesthesia with the patient or authorized representative who has indicated his/her understanding and acceptance.       Plan Discussed with: CRNA, Anesthesiologist and Surgeon  Anesthesia Plan Comments:         Anesthesia Quick Evaluation

## 2020-06-27 NOTE — CV Procedure (Signed)
° ° °  Electrical Cardioversion Procedure Note Elizabeth Wang [id number] 11-24-1945  Procedure: Electrical Cardioversion Indications:  Atrial Fibrillation  Time Out: Verified patient identification, verified procedure,medications/allergies/relevent history reviewed, required imaging and test results available.  Performed  Procedure Details  The patient was NPO after midnight. Anesthesia was administered at the beside  by Dr. Marcie Bal with propofol.  Cardioversion was performed with synchronized biphasic defibrillation via AP pads with 200 joules.  1 attempt(s) were performed.  The patient converted to normal sinus rhythm. The patient tolerated the procedure well   IMPRESSION:  Successful cardioversion of atrial fibrillation    [name] 06/27/2020, 10:21 AM

## 2020-06-27 NOTE — Discharge Instructions (Signed)
Electrical Cardioversion Electrical cardioversion is the delivery of a jolt of electricity to restore a normal rhythm to the heart. A rhythm that is too fast or is not regular keeps the heart from pumping well. In this procedure, sticky patches or metal paddles are placed on the chest to deliver electricity to the heart from a device. This procedure may be done in an emergency if:  There is low or no blood pressure as a result of the heart rhythm.  Normal rhythm must be restored as fast as possible to protect the brain and heart from further damage.  It may save a life. This may also be a scheduled procedure for irregular or fast heart rhythms that are not immediately life-threatening. Tell a health care provider about:  Any allergies you have.  All medicines you are taking, including vitamins, herbs, eye drops, creams, and over-the-counter medicines.  Any problems you or family members have had with anesthetic medicines.  Any blood disorders you have.  Any surgeries you have had.  Any medical conditions you have.  Whether you are pregnant or may be pregnant. What are the risks? Generally, this is a safe procedure. However, problems may occur, including:  Allergic reactions to medicines.  A blood clot that breaks free and travels to other parts of your body.  The possible return of an abnormal heart rhythm within hours or days after the procedure.  Your heart stopping (cardiac arrest). This is rare. What happens before the procedure? Medicines  Your health care provider may have you start taking: ? Blood-thinning medicines (anticoagulants) so your blood does not clot as easily. ? Medicines to help stabilize your heart rate and rhythm.  Ask your health care provider about: ? Changing or stopping your regular medicines. This is especially important if you are taking diabetes medicines or blood thinners. ? Taking medicines such as aspirin and ibuprofen. These medicines can  thin your blood. Do not take these medicines unless your health care provider tells you to take them. ? Taking over-the-counter medicines, vitamins, herbs, and supplements. General instructions  Follow instructions from your health care provider about eating or drinking restrictions.  Plan to have someone take you home from the hospital or clinic.  If you will be going home right after the procedure, plan to have someone with you for 24 hours.  Ask your health care provider what steps will be taken to help prevent infection. These may include washing your skin with a germ-killing soap. What happens during the procedure?   An IV will be inserted into one of your veins.  Sticky patches (electrodes) or metal paddles may be placed on your chest.  You will be given a medicine to help you relax (sedative).  An electrical shock will be delivered. The procedure may vary among health care providers and hospitals. What can I expect after the procedure?  Your blood pressure, heart rate, breathing rate, and blood oxygen level will be monitored until you leave the hospital or clinic.  Your heart rhythm will be watched to make sure it does not change.  You may have some redness on the skin where the shocks were given. Follow these instructions at home:  Do not drive for 24 hours if you were given a sedative during your procedure.  Take over-the-counter and prescription medicines only as told by your health care provider.  Ask your health care provider how to check your pulse. Check it often.  Rest for 48 hours after the procedure or   as told by your health care provider.  Avoid or limit your caffeine use as told by your health care provider.  Keep all follow-up visits as told by your health care provider. This is important. Contact a health care provider if:  You feel like your heart is beating too quickly or your pulse is not regular.  You have a serious muscle cramp that does not go  away. Get help right away if:  You have discomfort in your chest.  You are dizzy or you feel faint.  You have trouble breathing or you are short of breath.  Your speech is slurred.  You have trouble moving an arm or leg on one side of your body.  Your fingers or toes turn cold or blue. Summary  Electrical cardioversion is the delivery of a jolt of electricity to restore a normal rhythm to the heart.  This procedure may be done right away in an emergency or may be a scheduled procedure if the condition is not an emergency.  Generally, this is a safe procedure.  After the procedure, check your pulse often as told by your health care provider. This information is not intended to replace advice given to you by your health care provider. Make sure you discuss any questions you have with your health care provider. Document Revised: 05/15/2019 Document Reviewed: 05/15/2019 Elsevier Patient Education  2020 Elsevier Inc.  

## 2020-06-27 NOTE — Interval H&P Note (Signed)
History and Physical Interval Note:  06/27/2020 9:44 AM  Elizabeth Wang  has presented today for surgery, with the diagnosis of A-FIB.  The various methods of treatment have been discussed with the patient and family. After consideration of risks, benefits and other options for treatment, the patient has consented to  Procedure(s): CARDIOVERSION (N/A) as a surgical intervention.  The patient's history has been reviewed, patient examined, no change in status, stable for surgery.  I have reviewed the patient's chart and labs.  Questions were answered to the patient's satisfaction.     [employer]

## 2020-06-27 NOTE — Transfer of Care (Signed)
Immediate Anesthesia Transfer of Care Note  Patient: Elizabeth Wang  Procedure(s) Performed: CARDIOVERSION (N/A )  Patient Location: Endoscopy Unit  Anesthesia Type:General  Level of Consciousness: awake, alert  and oriented  Airway & Oxygen Therapy: Patient Spontanous Breathing  Post-op Assessment: Report given to RN and Post -op Vital signs reviewed and stable  Post vital signs: Reviewed and stable  Last Vitals:  Vitals Value Taken Time  BP 121/63   Temp    Pulse 78   Resp 12   SpO2 96     Last Pain:  Vitals:   06/27/20 0935  TempSrc: Axillary  PainSc: 0-No pain         Complications: No complications documented.

## 2020-06-28 ENCOUNTER — Telehealth: Payer: Self-pay | Admitting: Cardiology

## 2020-06-28 DIAGNOSIS — M1120 Other chondrocalcinosis, unspecified site: Secondary | ICD-10-CM | POA: Diagnosis not present

## 2020-06-28 DIAGNOSIS — I4819 Other persistent atrial fibrillation: Secondary | ICD-10-CM

## 2020-06-28 DIAGNOSIS — I480 Paroxysmal atrial fibrillation: Secondary | ICD-10-CM

## 2020-06-28 DIAGNOSIS — M150 Primary generalized (osteo)arthritis: Secondary | ICD-10-CM | POA: Diagnosis not present

## 2020-06-28 DIAGNOSIS — Z79899 Other long term (current) drug therapy: Secondary | ICD-10-CM | POA: Diagnosis not present

## 2020-06-28 DIAGNOSIS — I4891 Unspecified atrial fibrillation: Secondary | ICD-10-CM

## 2020-06-28 DIAGNOSIS — M2550 Pain in unspecified joint: Secondary | ICD-10-CM | POA: Diagnosis not present

## 2020-06-28 DIAGNOSIS — Z6841 Body Mass Index (BMI) 40.0 and over, adult: Secondary | ICD-10-CM | POA: Diagnosis not present

## 2020-06-28 NOTE — Anesthesia Postprocedure Evaluation (Signed)
Anesthesia Post Note  Patient: Elizabeth Wang  Procedure(s) Performed: CARDIOVERSION (N/A )     Patient location during evaluation: Endoscopy Anesthesia Type: General Level of consciousness: awake and alert Pain management: pain level controlled Vital Signs Assessment: post-procedure vital signs reviewed and stable Respiratory status: spontaneous breathing, nonlabored ventilation, respiratory function stable and patient connected to nasal cannula oxygen Cardiovascular status: blood pressure returned to baseline and stable Postop Assessment: no apparent nausea or vomiting Anesthetic complications: no   No complications documented.  Last Vitals:  Vitals:   06/27/20 1030 06/27/20 1040  BP: (!) 120/51 117/60  Pulse: 74 76  Resp: (!) 22 (!) 26  Temp:    SpO2: 100% 98%    Last Pain:  Vitals:   06/28/20 1306  TempSrc:   PainSc: 0-No pain                 Elizabeth Wang S

## 2020-06-28 NOTE — Telephone Encounter (Signed)
Pt had DCCV yesterday and went back into afib around 6:00 pm last night.Pt wanted to inform Dr Meda Coffee. Will forward message to Dr Meda Coffee for review .Adonis Housekeeper

## 2020-06-28 NOTE — Telephone Encounter (Signed)
The patient called and stated that she wanted to update Dr. Meda Coffee and nurse after her ablation. She wanted to give an update on her afib

## 2020-06-30 ENCOUNTER — Encounter (HOSPITAL_COMMUNITY): Payer: Self-pay | Admitting: Cardiology

## 2020-07-01 NOTE — Telephone Encounter (Signed)
We will refer to the EP clinic for consideration of other antiarrhythmics such as tikosyn or for an ablation

## 2020-07-02 NOTE — Telephone Encounter (Signed)
Left a detailed message on the pts confirmed voicemail with Dr. Francesca Oman recommendations, for [place] to refer her to one of our EP MD's, for consideration of other antiarrhythmics such as tikosyn or possible ablation.  Left her a detailed message that I will go ahead and place the referral in the system and have their scheduler [place], call her back and arrange this appt.  Advised the pt to call back with any further additional questions or concerns regarding this.

## 2020-07-02 NOTE — Telephone Encounter (Signed)
Pt was contacted by EP Scheduler.  She is scheduled for 07/04/20 at 1130 to see Dr. [name] in EP.

## 2020-07-03 NOTE — Progress Notes (Signed)
Electrophysiology Office Note:    Date:  07/03/2020   ID:  Elizabeth Wang, DOB 04-Feb-1946, MRN [id number]  PCP:  [name], MD  [facility] Cardiologist:  [name], MD  [facility] Electrophysiologist:  None   Referring MD: Dorothy Spark, MD   Chief Complaint: AF  History of Present Illness:    Elizabeth Wang is a 74 y.o. female presents to clinic for an evaluation of her atrial fibrillation at the request of Dr Meda Coffee. Her medical history includes PE, asthma, HTN, osa and obesity. Her AF was first diagnosed in 2014 during an acute stressor. She went a long time without an episode until January when she received her first covid vaccine. Since that vaccine she has been in persistent atrial fibrillation. She had a recent cardioversion on 06/27/2020 but was back in atrial fibrillation by dinner that same day. She is completely asymptomatic with her atrial fibrillation. She is active, walks 6-8k steps per day, is an owner of an investment firm. No limitations in her day to day activities. No syncope/presyncope.  Past Medical History:  Diagnosis Date  . Acute pulmonary embolism ([place]) 06/2013  . Arthritis   . Asthma    years ago   . Clotting disorder (HCC)    DVT/PE  . Diverticulosis    minor   . Dyslipidemia   . Esophageal reflux   . Hypertension   . Hypothyroidism   . Numbness    TOES  . Obesity   . OSA (obstructive sleep apnea)    severe OSA with an AHI of 51/hr and nocturnal hypoxemia with O2 sats at low as 69%.  . Paroxysmal atrial fibrillation ([place])    1 isolated episode in the setting of acute stress with no further episodes.   . Pseudogout     Past Surgical History:  Procedure Laterality Date  . BREAST RECONSTRUCTION    . CARDIOVERSION N/A 06/27/2020   Procedure: CARDIOVERSION;  Surgeon: [name], MD;  Location: [facility] ENDOSCOPY;  Service: Cardiovascular;  Laterality: N/A;  . CESAREAN SECTION    . COLONOSCOPY  2004   repeat in ten years  . KNEE ARTHROSCOPY  Left   . TONSILLECTOMY    . 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 TOTAL KNEE ARTHROPLASTY Left 07/23/2014   Procedure: LEFT TOTAL KNEE ARTHROPLASTY;  Surgeon: [name], MD;  Location: [facility] ORS;  Service: Orthopedics;  Laterality: Left;  . tummy tuck  1987  . WISDOM TOOTH EXTRACTION      Current Medications: No outpatient medications have been marked as taking for the 07/04/20 encounter (Appointment) with Vickie Epley, MD.     Allergies:   Nsaids, Bee venom, Other, and Codeine   Social History   Socioeconomic History  . Marital status: Married    Spouse name: Not on file  . Number of children: Not on file  . Years of education: Not on file  . Highest education level: Not on file  Occupational History  . Occupation: Music therapist  Tobacco Use  . Smoking status: Former Smoker    Packs/day: 0.25    Years: 12.00    Pack years: 3.00    Quit date: 12/28/1975    Years since quitting: 44.5  . Smokeless tobacco: Never Used  Substance and Sexual Activity  . Alcohol use: Yes    Alcohol/week: 4.0 standard drinks  Types: 4 Glasses of wine per week    Comment: socially  . Drug use: No  . Sexual activity: Not on file  Other Topics Concern  . Not on file  Social History Narrative   Lives in [place] with her husband. Exercises at the gym 4 times a week.   Social Determinants of Health   Financial Resource Strain:   . Difficulty of Paying Living Expenses: Not on file  Food Insecurity:   . Worried About Charity fundraiser in the Last Year: Not on file  . Ran Out of Food in the Last Year: Not on file  Transportation Needs:   . Lack of Transportation (Medical): Not on file  . Lack of Transportation (Non-Medical): Not on file  Physical Activity:   . Days of Exercise per Week: Not on file  . Minutes of Exercise per Session: Not  on file  Stress:   . Feeling of Stress : Not on file  Social Connections:   . Frequency of Communication with Friends and Family: Not on file  . Frequency of Social Gatherings with Friends and Family: Not on file  . Attends Religious Services: Not on file  . Active Member of Clubs or Organizations: Not on file  . Attends Archivist Meetings: Not on file  . Marital Status: Not on file     Family History: The patient's family history includes CVA in her mother; Coronary artery disease in her father; Heart disease in her father; Hypertension in her father.  ROS:   Please see the history of present illness.    All other systems reviewed and are negative.  EKGs/Labs/Other Studies Reviewed:    The following studies were reviewed today: Apple watch tracings, echo   Above Apple Watch tracing shows atrial fibrillation.   05/14/2017 Echo - Left ventricle: The cavity size was normal. Wall thickness was  normal. Systolic function was normal. The estimated ejection  fraction was in the range of 60% to 65%. Wall motion was normal;  there were no regional wall motion abnormalities. Doppler  parameters are consistent with abnormal left ventricular  relaxation (grade 1 diastolic dysfunction).  - Aortic valve: There was mild stenosis. Valve area (VTI): 3.19  cm^2. Valve area (Vmax): 2.84 cm^2. Valve area (Vmean): 2.62  cm^2.  - Mitral valve: Moderately calcified annulus. There was mild  regurgitation. Valve area by continuity equation (using LVOT  flow): 6.98 cm^2.  - Left atrium: The atrium was moderately dilated.   EKG:  The ekg ordered today demonstrates atrial fibrillation.  Recent Labs: 12/01/2019: ALT 35; TSH 1.150 06/20/2020: BUN 28; Creatinine, Ser 1.22; Hemoglobin 13.5; Platelets 180; Potassium 3.7; Sodium 134  Recent Lipid Panel    Component Value Date/Time   CHOL 138 12/01/2019 1529   TRIG 168 (H) 12/01/2019 1529   HDL 46 12/01/2019 1529    CHOLHDL 3.0 12/01/2019 1529   LDLCALC 64 12/01/2019 1529    Physical Exam:    VS:  LMP  (LMP Unknown)     Wt Readings from Last 3 Encounters:  06/27/20 230 lb (104.3 kg)  06/20/20 228 lb 6.4 oz (103.6 kg)  04/10/20 225 lb (102.1 kg)     GEN:  Well nourished, well developed in no acute distress. Obese. HEENT: Normal NECK: No JVD; No carotid bruits LYMPHATICS: No lymphadenopathy CARDIAC: irregularly irregular, no murmurs, rubs, gallops RESPIRATORY:  Clear to auscultation without rales, wheezing or rhonchi  ABDOMEN: Soft, non-tender, non-distended MUSCULOSKELETAL:  No edema; No deformity  SKIN: Warm and dry NEUROLOGIC:  Alert and oriented x 3 PSYCHIATRIC:  Normal affect   ASSESSMENT:    1. PAF (paroxysmal atrial fibrillation) ([place])   2. OSA (obstructive sleep apnea)   3. Essential hypertension    PLAN:    In order of problems listed above:  1. Persistent atrial fibrillation Patient is asymptomatic. I have discussed treatment options at length with the patient including PVI, antiarrhythmic medications (Class IC and III agents would be possible) and continued conservative management with stroke prophylaxis and rate control. To help guide our decision on next steps, we will order an updated echo to determine if the AF is having any deleterious effects on the LV function. If LV function is preserved, no MR, will continue with the current strategy of rate control with stroke prophylaxis. She is tolerating her xarelto without any bleeding troubles  - echo  2. OSA Uses bpap nightly. Encouraged continued use.  3. HTN Continue diltiazem, telmisartan, HCTZ.     Medication Adjustments/Labs and Tests Ordered: Current medicines are reviewed at length with the patient today.  Concerns regarding medicines are outlined above.  No orders of the defined types were placed in this encounter.  No orders of the defined types were placed in this encounter.   There are no Patient  Instructions on file for this visit.   Signed, Lars Mage, MD, [facility]  07/03/2020 8:59 PM    Electrophysiology [facility]

## 2020-07-04 ENCOUNTER — Ambulatory Visit (INDEPENDENT_AMBULATORY_CARE_PROVIDER_SITE_OTHER): Payer: Medicare Other | Admitting: Cardiology

## 2020-07-04 ENCOUNTER — Encounter: Payer: Self-pay | Admitting: Cardiology

## 2020-07-04 ENCOUNTER — Other Ambulatory Visit: Payer: Self-pay

## 2020-07-04 VITALS — BP 122/68 | HR 73 | Ht 63.5 in | Wt 226.8 lb

## 2020-07-04 DIAGNOSIS — I480 Paroxysmal atrial fibrillation: Secondary | ICD-10-CM | POA: Diagnosis not present

## 2020-07-04 DIAGNOSIS — G4733 Obstructive sleep apnea (adult) (pediatric): Secondary | ICD-10-CM

## 2020-07-04 DIAGNOSIS — I10 Essential (primary) hypertension: Secondary | ICD-10-CM | POA: Diagnosis not present

## 2020-07-04 NOTE — Patient Instructions (Addendum)
Medication Instructions:  Your physician recommends that you continue on your current medications as directed. Please refer to the Current Medication list given to you today.  *If you need a refill on your cardiac medications before your next appointment, please call your pharmacy*  Lab Work: None ordered.  If you have labs (blood work) drawn today and your tests are completely normal, you will receive your results only by: [name] MyChart Message (if you have MyChart) OR . A paper copy in the mail If you have any lab test that is abnormal or we need to change your treatment, we will call you to review the results.  Testing/Procedures: Your physician has requested that you have an echocardiogram. Echocardiography is a painless test that uses sound waves to create images of your heart. It provides your doctor with information about the size and shape of your heart and how well your heart's chambers and valves are working. This procedure takes approximately one hour. There are no restrictions for this procedure.    Follow-Up: At [facility], you and your health needs are our priority.  As part of our continuing mission to provide you with exceptional heart care, we have created designated Provider Care Teams.  These Care Teams include your primary Cardiologist (physician) and Advanced Practice Providers (APPs -  Physician Assistants and Nurse Practitioners) who all work together to provide you with the care you need, when you need it.  We recommend signing up for the patient portal called "MyChart".  Sign up information is provided on this After Visit Summary.  MyChart is used to connect with patients for Virtual Visits (Telemedicine).  Patients are able to view lab/test results, encounter notes, upcoming appointments, etc.  Non-urgent messages can be sent to your provider as well.   To learn more about what you can do with MyChart, go to [email or web address].    Your next appointment:   Your  physician wants you to follow-up in: 6 months with Dr. [name]. You will receive a reminder letter in the mail two months in advance. If you don't receive a letter, please call our office to schedule the follow-up appointment. .  Other Instructions:

## 2020-07-18 ENCOUNTER — Encounter (HOSPITAL_COMMUNITY): Payer: Self-pay

## 2020-07-18 ENCOUNTER — Ambulatory Visit (HOSPITAL_COMMUNITY): Payer: Medicare Other | Attending: Cardiology

## 2020-07-18 NOTE — Progress Notes (Signed)
Verified appointment "no show" status with R. Cathey at 14:58.  °

## 2020-07-19 ENCOUNTER — Ambulatory Visit (HOSPITAL_COMMUNITY): Payer: Medicare Other | Attending: Cardiology

## 2020-07-19 ENCOUNTER — Other Ambulatory Visit: Payer: Self-pay

## 2020-07-19 DIAGNOSIS — I480 Paroxysmal atrial fibrillation: Secondary | ICD-10-CM | POA: Insufficient documentation

## 2020-07-19 DIAGNOSIS — I10 Essential (primary) hypertension: Secondary | ICD-10-CM | POA: Diagnosis not present

## 2020-07-19 DIAGNOSIS — G4733 Obstructive sleep apnea (adult) (pediatric): Secondary | ICD-10-CM | POA: Diagnosis not present

## 2020-07-19 LAB — ECHOCARDIOGRAM COMPLETE
AR max vel: 2.34 cm2
AV Area VTI: 2.4 cm2
AV Area mean vel: 2.21 cm2
AV Mean grad: 13 mmHg
AV Peak grad: 20.4 mmHg
Ao pk vel: 2.26 m/s
Area-P 1/2: 3.2 cm2
S' Lateral: 3.3 cm

## 2020-07-31 DIAGNOSIS — Z23 Encounter for immunization: Secondary | ICD-10-CM | POA: Diagnosis not present

## 2020-09-09 ENCOUNTER — Other Ambulatory Visit: Payer: Self-pay | Admitting: Cardiology

## 2020-09-09 DIAGNOSIS — I10 Essential (primary) hypertension: Secondary | ICD-10-CM

## 2020-09-09 DIAGNOSIS — T8544XD Capsular contracture of breast implant, subsequent encounter: Secondary | ICD-10-CM | POA: Diagnosis not present

## 2020-09-09 DIAGNOSIS — T8549XA Other mechanical complication of breast prosthesis and implant, initial encounter: Secondary | ICD-10-CM | POA: Diagnosis not present

## 2020-09-09 DIAGNOSIS — E782 Mixed hyperlipidemia: Secondary | ICD-10-CM

## 2020-09-09 DIAGNOSIS — I480 Paroxysmal atrial fibrillation: Secondary | ICD-10-CM

## 2020-10-03 ENCOUNTER — Telehealth: Payer: Self-pay | Admitting: *Deleted

## 2020-10-03 NOTE — Telephone Encounter (Signed)
   [facility] Pre-operative Risk Assessment    HEARTCARE STAFF: - Please ensure there is not already an duplicate clearance open for this procedure. - Under Visit Info/Reason for Call, type in Other and utilize the format Clearance MM/DD/YY or Clearance TBD. Do not use dashes or single digits. - If request is for dental extraction, please clarify the # of teeth to be extracted.  Request for surgical clearance:  1. What type of surgery is being performed? B/L BREAST CAPSULECTOMY and REMOVAL OF IMPLANT MATERIAL   2. When is this surgery scheduled? TBD (SOMETIME IN 11/2020)   3. What type of clearance is required (medical clearance vs. Pharmacy clearance to hold med vs. Both)? BOTH  4. Are there any medications that need to be held prior to surgery and how long? XARELTO x 2 DAYS PRIOR AND REMAIN OFF FOR 2 DAYS POST PROCEDURE   5. Practice name and name of physician performing surgery? [facility]; DR. DAVID BOWERS   6. What is the office phone number? [phone]   7.   What is the office fax number? [phone]  8.   Anesthesia type (None, local, MAC, general) ? GENERAL   Julaine Hua 10/03/2020, 10:36 AM  _________________________________________________________________   (provider comments below)

## 2020-10-03 NOTE — Telephone Encounter (Signed)
Patient has a follow-up visit with Dr. Meda Coffee scheduled for 10/31/2020. Surgery is not planned until sometime in 11/2020. Therefore, pre-op risk assessment can be addressed at visit with Dr. Meda Coffee. Will route note to her so that she is aware and will add "pre-op eval" to appointment notes for upcoming visit.  Pre-op covering staff, can you just notify requesting surgeon's office of this appointment?  Thank you!

## 2020-10-03 NOTE — Telephone Encounter (Signed)
Pt notified she will wait until scheduled appt since this is not going to be scheduled until Feb  visit with Dr. Meda Coffee scheduled for 10/31/2020  Forwarded to requesting providers office via [place] fax function

## 2020-10-03 NOTE — Telephone Encounter (Signed)
I will see her on 10/31/20

## 2020-10-31 ENCOUNTER — Encounter: Payer: Self-pay | Admitting: Cardiology

## 2020-10-31 ENCOUNTER — Encounter: Payer: Self-pay | Admitting: *Deleted

## 2020-10-31 ENCOUNTER — Ambulatory Visit (INDEPENDENT_AMBULATORY_CARE_PROVIDER_SITE_OTHER): Payer: Medicare Other | Admitting: Cardiology

## 2020-10-31 ENCOUNTER — Other Ambulatory Visit: Payer: Self-pay

## 2020-10-31 VITALS — BP 116/68 | HR 90 | Ht 63.5 in | Wt 233.0 lb

## 2020-10-31 DIAGNOSIS — R0600 Dyspnea, unspecified: Secondary | ICD-10-CM

## 2020-10-31 DIAGNOSIS — I4819 Other persistent atrial fibrillation: Secondary | ICD-10-CM

## 2020-10-31 DIAGNOSIS — Z01810 Encounter for preprocedural cardiovascular examination: Secondary | ICD-10-CM

## 2020-10-31 DIAGNOSIS — R0609 Other forms of dyspnea: Secondary | ICD-10-CM

## 2020-10-31 NOTE — Progress Notes (Signed)
Cardiology Office Note:    ID:  Elizabeth Wang, Elizabeth Wang 1946-09-16, MRN [id number]  PCP:  [name], MD  Cardiologist:  [name], MD   Referring MD: [name], MD   Reason for visit: Follow-up for atrial fibrillation  History of Present Illness:    Elizabeth Wang is a 75 y.o. female with a hx of paroxysmal atrial fibrillation (1 episode in the setting of acute distress and 2014), hypertension, right leg DVT and PE in 06/2013 (provoked by travel and dehydration) .  At that time she only had one isolated episode of A. fib and she was only placed on aspirin. The patient had an echocardiogram in 06/2013 that showed normal LVEF, trivial AR, moderately to severely calcified mitral annulus. The patient was doing well until February 2021 when she received Covid vaccine and we will up atrial fibrillation again her CHA2DS2-VASc score is 3, she was started on Xarelto.    The patient was seen by Dr. [name] in September 2021 for A. fib consultation and they decided to continue rate control.  The patient shows me her apple watch readings and most of her heart rates are in the range from 60s to 90s with average heart rate in 70s.  She denies any chest pain, she does not feel any palpitations, or Christmas she had an episode of dyspnea on exertion while walking a short distance.  No presyncope or syncope.  No lower extremity edema other than chronic left ankle edema after an old injury.  She is planning to have breast surgery -implant removal on November 28, 2020.  Past Medical History:  Diagnosis Date  . Acute pulmonary embolism ([place]) 06/2013  . Arthritis   . Asthma    years ago   . Clotting disorder (HCC)    DVT/PE  . Diverticulosis    minor   . Dyslipidemia   . Esophageal reflux   . Hypertension   . Hypothyroidism   . Numbness    TOES  . Obesity   . OSA (obstructive sleep apnea)    severe OSA with an AHI of 51/hr and nocturnal hypoxemia with O2 sats at low as 69%.  . Paroxysmal atrial  fibrillation ([place])    1 isolated episode in the setting of acute stress with no further episodes.   . Pseudogout     Past Surgical History:  Procedure Laterality Date  . BREAST RECONSTRUCTION    . CARDIOVERSION N/A 06/27/2020   Procedure: CARDIOVERSION;  Surgeon: [name], MD;  Location: [facility] ENDOSCOPY;  Service: Cardiovascular;  Laterality: N/A;  . CESAREAN SECTION    . COLONOSCOPY  2004   repeat in ten years  . KNEE ARTHROSCOPY Left   . TONSILLECTOMY    . 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 TOTAL KNEE ARTHROPLASTY Left 07/23/2014   Procedure: LEFT TOTAL KNEE ARTHROPLASTY;  Surgeon: [name], MD;  Location: [facility] ORS;  Service: Orthopedics;  Laterality: Left;  . tummy tuck  1987  . WISDOM TOOTH EXTRACTION      Current Medications: Current Meds  Medication Sig  . acetaminophen (TYLENOL) 500 MG tablet Take 1,000 mg by mouth every 8 (eight) hours as needed for moderate pain or headache.  . ASPERCREME LIDOCAINE EX Apply 1 application topically daily as needed (arthritis pain).  . cetirizine (ZYRTEC) 10 MG tablet Take 10 mg by mouth daily as  needed for allergies.   [name] colchicine 0.6 MG tablet Take 0.6 mg by mouth at bedtime. For gout  . diltiazem (CARDIZEM CD) 240 MG 24 hr capsule Take 1 capsule (240 mg total) by mouth daily.  [name] EPINEPHrine 0.3 mg/0.3 mL IJ SOAJ injection Inject 0.3 mg into the muscle as needed for anaphylaxis (Allergic Reaction).   . fluticasone (FLONASE) 50 MCG/ACT nasal spray Place 1-2 sprays into both nostrils daily as needed for allergies.   . hydrochlorothiazide (HYDRODIURIL) 25 MG tablet TAKE ONE TABLET DAILY  . ketotifen (ZADITOR) 0.025 % ophthalmic solution Place 1 drop into both eyes daily as needed (allergies).  [name] levothyroxine (SYNTHROID, LEVOTHROID) 125 MCG tablet Take 125 mcg by mouth daily before breakfast.  For thyroid therapy  . ondansetron (ZOFRAN-ODT) 4 MG disintegrating tablet Take 4 mg by mouth 3 (three) times daily as needed (nausea/vomiting). For N&V  . rosuvastatin (CRESTOR) 10 MG tablet TAKE ONE TABLET DAILY  . telmisartan (MICARDIS) 80 MG tablet Take 80 mg by mouth daily.  . valACYclovir (VALTREX) 1000 MG tablet Take 1,000 mg by mouth daily as needed (outbreaks).   [name] 20 MG TABS tablet TAKE ONE TABLET EACH DAY WITH SUPPER     Allergies:   Nsaids, Bee venom, Other, and Codeine   Social History   Socioeconomic History  . Marital status: Married    Spouse name: Not on file  . Number of children: Not on file  . Years of education: Not on file  . Highest education level: Not on file  Occupational History  . Occupation: Firefighter  Tobacco Use  . Smoking status: Former Smoker    Packs/day: 0.25    Years: 12.00    Pack years: 3.00    Quit date: 12/28/1975    Years since quitting: 44.8  . Smokeless tobacco: Never Used  Substance and Sexual Activity  . Alcohol use: Yes    Alcohol/week: 4.0 standard drinks    Types: 4 Glasses of wine per week    Comment: socially  . Drug use: No  . Sexual activity: Not on file  Other Topics Concern  . Not on file  Social History Narrative   Lives in [place] with her husband. Exercises at the gym 4 times a week.   Social Determinants of Health   Financial Resource Strain: Not on file  Food Insecurity: Not on file  Transportation Needs: Not on file  Physical Activity: Not on file  Stress: Not on file  Social Connections: Not on file   Family History: The patient's family history includes CVA in her mother; Coronary artery disease in her father; Heart disease in her father; Hypertension in her father. ROS:   Please see the history of present illness.     All other systems reviewed and are negative.  EKGs/Labs/Other Studies Reviewed:    The following studies were reviewed today:  EKG:  EKG is  ordered today.  The ekg  ordered today demonstrates atrial fibrillation with 91 bpm, this is changed from the last EKG that showed normal sinus rhythm.  This was personally reviewed.  Recent Labs: 12/01/2019: ALT 35; TSH 1.150 06/20/2020: BUN 28; Creatinine, Ser 1.22; Hemoglobin 13.5; Platelets 180; Potassium 3.7; Sodium 134  Recent Lipid Panel    Component Value Date/Time   CHOL 138 12/01/2019 1529   TRIG 168 (H) 12/01/2019 1529   HDL 46 12/01/2019 1529   CHOLHDL 3.0 12/01/2019 1529   LDLCALC 64 12/01/2019 1529    Physical Exam:  VS:  BP 116/68   Pulse 90   Ht 5' 3.5" (1.613 m)   Wt 233 lb (105.7 kg)   LMP  (LMP Unknown)   SpO2 98%   BMI 40.63 kg/m     Wt Readings from Last 3 Encounters:  10/31/20 233 lb (105.7 kg)  07/04/20 226 lb 12.8 oz (102.9 kg)  06/27/20 230 lb (104.3 kg)     GEN:  Well nourished, well developed in no acute distress HEENT: Normal NECK: No JVD; No carotid bruits LYMPHATICS: No lymphadenopathy CARDIAC: RRR, no murmurs, rubs, gallops RESPIRATORY:  Clear to auscultation without rales, wheezing or rhonchi  ABDOMEN: Soft, non-tender, non-distended MUSCULOSKELETAL:  No edema; No deformity  SKIN: Warm and dry NEUROLOGIC:  Alert and oriented x 3 PSYCHIATRIC:  Normal affect   TTE: 07/19/2020 1. Left ventricular ejection fraction, by estimation, is 60 to 65%. The  left ventricle has normal function. The left ventricle has no regional  wall motion abnormalities. There is mild left ventricular hypertrophy.  Left ventricular diastolic parameters  are indeterminate.  2. Right ventricular systolic function is normal. The right ventricular  size is normal.  3. Left atrial size was moderately dilated.  4. Right atrial size was moderately dilated.  5. The mitral valve is normal in structure. No evidence of mitral valve  regurgitation. No evidence of mitral stenosis.  6. The aortic valve is tricuspid. Aortic valve regurgitation is not  visualized. Mild aortic valve stenosis.  Aortic valve mean gradient  measures 13.0 mmHg.  7. Aortic dilatation noted. There is mild dilatation of the ascending  aorta, measuring 37 mm.  8. The inferior vena cava is normal in size with greater than 50%  respiratory variability, suggesting right atrial pressure of 3 mmHg.  9. The patient is in atrial fibrillation.   EKG: atrial fibrillation, 90 BPM, unchanged from prior, personally reviewed   ASSESSMENT:    1. Persistent atrial fibrillation ([place])   2. DOE (dyspnea on exertion)   3. Encounter for pre-operative cardiovascular clearance     PLAN:    In order of problems listed above:  1.  Chronic atrial fibrillation -since February 2021.  Echo in September 2021 showed LVEF 60 to 65%, moderately dilated left atrium and no mitral regurgitation.  She has no bleeding and normal hemoglobin on Xarelto.  2.  Preoperative evaluation for breast surgery  - -this is intermediate risk surgery, she will require to hold Xarelto day prior to surgery and day of surgery and thereafter, she will not need bridging,.  Because of recent episodes of dyspnea on exertion I will plan for preoperative stress testing to evaluate for ischemia.    3. HTN  Well-controlled on current regimen  4. Hyperlipidemia, she tolerates Crestor well, all lipids are at goal now.  HDL 46, LDL 76, triglycerides 107 hemoglobin A1c excellent at 5.1%.  5.  Obstructive sleep apnea -severe just started on BiPAP.  She follows with Dr. Radford Pax  Follow-up in 6 months.  Medication Adjustments/Labs and Tests Ordered: Current medicines are reviewed at length with the patient today.  Concerns regarding medicines are outlined above.  Orders Placed This Encounter  Procedures  . MYOCARDIAL PERFUSION IMAGING  . EKG 12-Lead   No orders of the defined types were placed in this encounter.  Signed, [name], MD  10/31/2020 10:16 AM    [place]

## 2020-10-31 NOTE — Addendum Note (Signed)
Addended by: Loa Socks on: 10/31/2020 02:23 PM   Modules accepted: Orders

## 2020-10-31 NOTE — Addendum Note (Signed)
Addended by: Lars Masson on: 10/31/2020 02:35 PM   Modules accepted: Orders

## 2020-10-31 NOTE — Patient Instructions (Addendum)
Medication Instructions:  ° °Your physician recommends that you continue on your current medications as directed. Please refer to the Current Medication list given to you today. ° °*If you need a refill on your cardiac medications before your next appointment, please call your pharmacy* ° °Testing/Procedures: ° °Your physician has requested that you have a lexiscan myoview. For further information please visit [email or web address]. Please follow instruction sheet, as given.  DO ON D-SPECT PER DR. NELSON ° °Follow-Up: °At [facility], you and your health needs are our priority.  As part of our continuing mission to provide you with exceptional heart care, we have created designated Provider Care Teams.  These Care Teams include your primary Cardiologist (physician) and Advanced Practice Providers (APPs -  Physician Assistants and Nurse Practitioners) who all work together to provide you with the care you need, when you need it. ° °We recommend signing up for the patient portal called "MyChart".  Sign up information is provided on this After Visit Summary.  MyChart is used to connect with patients for Virtual Visits (Telemedicine).  Patients are able to view lab/test results, encounter notes, upcoming appointments, etc.  Non-urgent messages can be sent to your provider as well.   °To learn more about what you can do with MyChart, go to [email or web address].   ° °Your next appointment:   °6 month(s) ° °The format for your next appointment:   °In Person ° °Provider:   °Katarina Nelson, MD ° ° ° °

## 2020-11-05 NOTE — Telephone Encounter (Signed)
Called the requesting office and left a voice message for Armen Pickup with the information about patient is scheduled for a 2 day nuclear stress test before cardiac clearance can be given and to please give our office a call, ask for the pre-op callback pool.

## 2020-11-05 NOTE — Telephone Encounter (Signed)
Callback pool to let the requesting office know that Dr. Meda Coffee is planning nuclear stress test prior to clearance. Myoview currently scheduled for 1/13 and 1/14.

## 2020-11-06 NOTE — Telephone Encounter (Signed)
Spoke with requesting surgeon's office - they have cancelled patient's surgery. Of note, patient has cancelled her stress test also. No further assistance needed w/clearance at this time.

## 2020-11-07 ENCOUNTER — Ambulatory Visit (HOSPITAL_COMMUNITY): Payer: Medicare Other

## 2020-11-07 DIAGNOSIS — Z1159 Encounter for screening for other viral diseases: Secondary | ICD-10-CM | POA: Diagnosis not present

## 2020-11-08 ENCOUNTER — Ambulatory Visit (HOSPITAL_COMMUNITY): Payer: Medicare Other

## 2020-12-02 ENCOUNTER — Other Ambulatory Visit: Payer: Self-pay | Admitting: Cardiology

## 2020-12-02 NOTE — Telephone Encounter (Signed)
Pt's age 75, wt 105.7 kg, SCr 1.22, CrCl 67.19, last ov w/ KN 10/31/20.

## 2020-12-26 DIAGNOSIS — Z6841 Body Mass Index (BMI) 40.0 and over, adult: Secondary | ICD-10-CM | POA: Diagnosis not present

## 2020-12-26 DIAGNOSIS — M1120 Other chondrocalcinosis, unspecified site: Secondary | ICD-10-CM | POA: Diagnosis not present

## 2020-12-26 DIAGNOSIS — M2550 Pain in unspecified joint: Secondary | ICD-10-CM | POA: Diagnosis not present

## 2020-12-26 DIAGNOSIS — M150 Primary generalized (osteo)arthritis: Secondary | ICD-10-CM | POA: Diagnosis not present

## 2020-12-26 DIAGNOSIS — Z79899 Other long term (current) drug therapy: Secondary | ICD-10-CM | POA: Diagnosis not present

## 2021-01-07 ENCOUNTER — Ambulatory Visit: Payer: Medicare Other | Admitting: Cardiology

## 2021-01-14 ENCOUNTER — Ambulatory Visit: Payer: Medicare Other | Admitting: Cardiology

## 2021-01-22 DIAGNOSIS — H43393 Other vitreous opacities, bilateral: Secondary | ICD-10-CM | POA: Diagnosis not present

## 2021-01-22 DIAGNOSIS — H25013 Cortical age-related cataract, bilateral: Secondary | ICD-10-CM | POA: Diagnosis not present

## 2021-01-22 DIAGNOSIS — H5203 Hypermetropia, bilateral: Secondary | ICD-10-CM | POA: Diagnosis not present

## 2021-01-22 DIAGNOSIS — H52223 Regular astigmatism, bilateral: Secondary | ICD-10-CM | POA: Diagnosis not present

## 2021-01-22 DIAGNOSIS — H43813 Vitreous degeneration, bilateral: Secondary | ICD-10-CM | POA: Diagnosis not present

## 2021-01-22 DIAGNOSIS — H524 Presbyopia: Secondary | ICD-10-CM | POA: Diagnosis not present

## 2021-01-30 ENCOUNTER — Ambulatory Visit: Payer: Medicare Other | Admitting: Cardiology

## 2021-02-05 DIAGNOSIS — U071 COVID-19: Secondary | ICD-10-CM | POA: Diagnosis not present

## 2021-02-18 ENCOUNTER — Other Ambulatory Visit: Payer: Self-pay

## 2021-02-18 ENCOUNTER — Ambulatory Visit (INDEPENDENT_AMBULATORY_CARE_PROVIDER_SITE_OTHER): Payer: Medicare Other | Admitting: Cardiology

## 2021-02-18 ENCOUNTER — Encounter: Payer: Self-pay | Admitting: Cardiology

## 2021-02-18 VITALS — BP 118/72 | HR 94 | Ht 63.5 in | Wt 234.6 lb

## 2021-02-18 DIAGNOSIS — G4733 Obstructive sleep apnea (adult) (pediatric): Secondary | ICD-10-CM | POA: Diagnosis not present

## 2021-02-18 DIAGNOSIS — I4819 Other persistent atrial fibrillation: Secondary | ICD-10-CM | POA: Diagnosis not present

## 2021-02-18 DIAGNOSIS — I10 Essential (primary) hypertension: Secondary | ICD-10-CM | POA: Diagnosis not present

## 2021-02-18 MED ORDER — DILTIAZEM HCL ER COATED BEADS 360 MG PO CP24: 360.0000 mg | ORAL_CAPSULE | Freq: Every day | ORAL | 3 refills | Status: DC

## 2021-02-18 NOTE — Progress Notes (Signed)
Electrophysiology Office Follow up Visit Note:    Date:  02/18/2021   ID:  Elizabeth Wang, DOB 29-Mar-1946, MRN [id number]  PCP:  Ginger Organ., MD  [facility] Cardiologist:  [name], MD  [facility] Electrophysiologist:  Vickie Epley, MD    Interval History:    Elizabeth Wang is a 75 y.o. female who presents for a follow up visit.  I last saw the patient July 04, 2020 for her persistent atrial fibrillation.  At the last appointment she was asymptomatic with her atrial fibrillation.  We ordered an echocardiogram to confirm the LV function was normal.  She tells me that she has had COVID since I last saw her within the last month.  She had symptoms for only a couple of days.  She has been vaccinated and boosted.  She says for the last month or so she has had an increased burden of symptoms related to her atrial fibrillation with fatigue.  She continues to wear her BiPAP nightly.  Past Medical History:  Diagnosis Date  . Acute pulmonary embolism ([place]) 06/2013  . Arthritis   . Asthma    years ago   . Clotting disorder (HCC)    DVT/PE  . Diverticulosis    minor   . Dyslipidemia   . Esophageal reflux   . Hypertension   . Hypothyroidism   . Numbness    TOES  . Obesity   . OSA (obstructive sleep apnea)    severe OSA with an AHI of 51/hr and nocturnal hypoxemia with O2 sats at low as 69%.  . Paroxysmal atrial fibrillation ([place])    1 isolated episode in the setting of acute stress with no further episodes.   . Pseudogout     Past Surgical History:  Procedure Laterality Date  . BREAST RECONSTRUCTION    . CARDIOVERSION N/A 06/27/2020   Procedure: CARDIOVERSION;  Surgeon: [name], MD;  Location: [facility] ENDOSCOPY;  Service: Cardiovascular;  Laterality: N/A;  . CESAREAN SECTION    . COLONOSCOPY  2004   repeat in ten years  . KNEE ARTHROSCOPY Left   . TONSILLECTOMY    . TOTAL KNEE ARTHROPLASTY Right 01/01/2014   Procedure: TOTAL RIGHT KNEE ARTHROPLASTY;   Surgeon: [name], MD;  Location: [facility] ORS;  Service: Orthopedics;  Laterality: Right;  . TOTAL KNEE ARTHROPLASTY Left 07/23/2014   Procedure: LEFT TOTAL KNEE ARTHROPLASTY;  Surgeon: [name], MD;  Location: [facility] ORS;  Service: Orthopedics;  Laterality: Left;  . tummy tuck  1987  . WISDOM TOOTH EXTRACTION      Current Medications: Current Meds  Medication Sig  . acetaminophen (TYLENOL) 500 MG tablet Take 1,000 mg by mouth every 8 (eight) hours as needed for moderate pain or headache.  . ASPERCREME LIDOCAINE EX Apply 1 application topically daily as needed (arthritis pain).  . cetirizine (ZYRTEC) 10 MG tablet Take 10 mg by mouth daily as needed for allergies.   [name] colchicine 0.6 MG tablet Take 0.6 mg by mouth at bedtime. For gout  . diltiazem (CARDIZEM CD) 360 MG 24 hr capsule Take 1 capsule (360 mg total) by mouth daily.  [name] EPINEPHrine 0.3 mg/0.3 mL IJ SOAJ injection Inject 0.3 mg into the muscle as needed for anaphylaxis (Allergic Reaction).   . fluticasone (FLONASE) 50 MCG/ACT nasal spray Place 1-2 sprays into both nostrils daily as needed for allergies.   . hydrochlorothiazide (HYDRODIURIL) 25 MG tablet TAKE ONE TABLET DAILY  . levothyroxine (SYNTHROID, LEVOTHROID) 125  MCG tablet Take 125 mcg by mouth daily before breakfast. For thyroid therapy  . ondansetron (ZOFRAN-ODT) 4 MG disintegrating tablet Take 4 mg by mouth 3 (three) times daily as needed (nausea/vomiting). For N&V  . rosuvastatin (CRESTOR) 10 MG tablet TAKE ONE TABLET DAILY  . telmisartan (MICARDIS) 80 MG tablet Take 80 mg by mouth daily.  . valACYclovir (VALTREX) 1000 MG tablet Take 1,000 mg by mouth daily as needed (outbreaks).   Alveda Reasons 20 MG TABS tablet TAKE ONE TABLET EACH DAY WITH SUPPER  . [DISCONTINUED] diltiazem (CARDIZEM CD) 240 MG 24 hr capsule Take 1 capsule (240 mg total) by mouth daily.     Allergies:   Nsaids, Bee venom, Other, and Codeine   Social History   Socioeconomic History  . Marital  status: Married    Spouse name: Not on file  . Number of children: Not on file  . Years of education: Not on file  . Highest education level: Not on file  Occupational History  . Occupation: Music therapist  Tobacco Use  . Smoking status: Former Smoker    Packs/day: 0.25    Years: 12.00    Pack years: 3.00    Quit date: 12/28/1975    Years since quitting: 45.1  . Smokeless tobacco: Never Used  Substance and Sexual Activity  . Alcohol use: Yes    Alcohol/week: 4.0 standard drinks    Types: 4 Glasses of wine per week    Comment: socially  . Drug use: No  . Sexual activity: Not on file  Other Topics Concern  . Not on file  Social History Narrative   Lives in [place] with her husband. Exercises at the gym 4 times a week.   Social Determinants of Health   Financial Resource Strain: Not on file  Food Insecurity: Not on file  Transportation Needs: Not on file  Physical Activity: Not on file  Stress: Not on file  Social Connections: Not on file     Family History: The patient's family history includes CVA in her mother; Coronary artery disease in her father; Heart disease in her father; Hypertension in her father.  ROS:   Please see the history of present illness.    All other systems reviewed and are negative.  EKGs/Labs/Other Studies Reviewed:    The following studies were reviewed today:  July 19, 2020 echo personally reviewed Left ventricular function normal, 60% Right ventricular function normal Moderate dilation of the left and right atrium No MR  EKG:  The ekg ordered today demonstrates atrial fibrillation with a ventricular rate of 94 bpm.  Recent Labs: 06/20/2020: BUN 28; Creatinine, Ser 1.22; Hemoglobin 13.5; Platelets 180; Potassium 3.7; Sodium 134  Recent Lipid Panel    Component Value Date/Time   CHOL 138 12/01/2019 1529   TRIG 168 (H) 12/01/2019 1529   HDL 46 12/01/2019 1529   CHOLHDL 3.0 12/01/2019 1529   LDLCALC 64 12/01/2019 1529     Physical Exam:    VS:  BP 118/72   Pulse 94   Ht 5' 3.5" (1.613 m)   Wt 234 lb 9.6 oz (106.4 kg)   LMP  (LMP Unknown)   SpO2 97%   BMI 40.91 kg/m     Wt Readings from Last 3 Encounters:  02/18/21 234 lb 9.6 oz (106.4 kg)  10/31/20 233 lb (105.7 kg)  07/04/20 226 lb 12.8 oz (102.9 kg)     GEN:  Well nourished, well developed in no acute distress.  Obese HEENT: Normal  NECK: No JVD; No carotid bruits LYMPHATICS: No lymphadenopathy CARDIAC: Irregularly irregular, no murmurs, rubs, gallops RESPIRATORY:  Clear to auscultation without rales, wheezing or rhonchi  ABDOMEN: Soft, non-tender, non-distended MUSCULOSKELETAL:  No edema; No deformity  SKIN: Warm and dry NEUROLOGIC:  Alert and oriented x 3 PSYCHIATRIC:  Normal affect   ASSESSMENT:    1. Persistent atrial fibrillation ([place])   2. Primary hypertension   3. OSA (obstructive sleep apnea)    PLAN:    In order of problems listed above:  1. Persistent atrial fibrillation Continue Xarelto for stroke prophylaxis. Left ventricular function normal.  No mitral regurgitation. She has started to develop symptoms related to her atrial fibrillation.  She is starting to consider ablation to help manage her atrial arrhythmia.  We will start by increasing her diltiazem to 360 mg by mouth once daily to see if this helps control some of her symptoms related to her increased ventricular rates.  We will plan to touch base in 3 months to see how she is doing.  If she continues to have symptoms, would favor proceeding with a pulmonary vein isolation procedure.  We discussed the procedure today.   2.  Hypertension Controlled. Continue current regimen.  3.  Obstructive sleep apnea Continue BiPAP.   Follow-up 3 months.   Medication Adjustments/Labs and Tests Ordered: Current medicines are reviewed at length with the patient today.  Concerns regarding medicines are outlined above.  Orders Placed This Encounter  Procedures  . EKG  12-Lead   Meds ordered this encounter  Medications  . diltiazem (CARDIZEM CD) 360 MG 24 hr capsule    Sig: Take 1 capsule (360 mg total) by mouth daily.    Dispense:  90 capsule    Refill:  3     Signed, Lars Mage, MD, [facility], [facility] 02/18/2021 9:13 AM    Electrophysiology [facility]

## 2021-02-18 NOTE — Patient Instructions (Addendum)
Medication Instructions:  Your physician has recommended you make the following change in your medication:  1.  INCREASE your diltiazem to 360 mg-  Take one capsule by mouth daily   *If you need a refill on your cardiac medications before your next appointment, please call your pharmacy*  Lab Work: None ordered. If you have labs (blood work) drawn today and your tests are completely normal, you will receive your results only by: [name] MyChart Message (if you have MyChart) OR . A paper copy in the mail If you have any lab test that is abnormal or we need to change your treatment, we will call you to review the results.  Testing/Procedures: None ordered.  Follow-Up: At [facility], you and your health needs are our priority.  As part of our continuing mission to provide you with exceptional heart care, we have created designated Provider Care Teams.  These Care Teams include your primary Cardiologist (physician) and Advanced Practice Providers (APPs -  Physician Assistants and Nurse Practitioners) who all work together to provide you with the care you need, when you need it.  Your next appointment:   Your physician wants you to follow-up in: 3 months with Dr. [name].

## 2021-02-20 ENCOUNTER — Ambulatory Visit: Payer: Medicare Other

## 2021-02-20 ENCOUNTER — Ambulatory Visit: Payer: Medicare Other | Attending: Internal Medicine

## 2021-02-20 ENCOUNTER — Other Ambulatory Visit: Payer: Self-pay

## 2021-02-20 ENCOUNTER — Other Ambulatory Visit (HOSPITAL_BASED_OUTPATIENT_CLINIC_OR_DEPARTMENT_OTHER): Payer: Self-pay

## 2021-02-20 DIAGNOSIS — Z23 Encounter for immunization: Secondary | ICD-10-CM

## 2021-02-20 MED ORDER — COVID-19 MRNA VACC (MODERNA) 100 MCG/0.5ML IM SUSP
INTRAMUSCULAR | 0 refills | Status: AC
  Filled 2021-02-20: qty 0.25, 1d supply, fill #0

## 2021-02-20 NOTE — Progress Notes (Signed)
   Covid-19 Vaccination Clinic  Name:  Elizabeth Wang    MRN: [id number] DOB: 04/28/1946  02/20/2021  [name] was observed post Covid-19 immunization for 15 minutes without incident. She was provided with Vaccine Information Sheet and instruction to access the V-Safe system.   [name] was instructed to call 911 with any severe reactions post vaccine: [name] Difficulty breathing  . Swelling of face and throat  . A fast heartbeat  . A bad rash all over body  . Dizziness and weakness   Immunizations Administered    Name Date Dose VIS Date Route   Moderna Covid-19 Booster Vaccine 02/20/2021  1:50 PM 0.25 mL 08/14/2020 Intramuscular   Manufacturer: Moderna   Lot: [id number]   [place]: [id number]

## 2021-03-05 DIAGNOSIS — D2239 Melanocytic nevi of other parts of face: Secondary | ICD-10-CM | POA: Diagnosis not present

## 2021-03-05 DIAGNOSIS — L738 Other specified follicular disorders: Secondary | ICD-10-CM | POA: Diagnosis not present

## 2021-03-05 DIAGNOSIS — D225 Melanocytic nevi of trunk: Secondary | ICD-10-CM | POA: Diagnosis not present

## 2021-03-05 DIAGNOSIS — L814 Other melanin hyperpigmentation: Secondary | ICD-10-CM | POA: Diagnosis not present

## 2021-04-04 ENCOUNTER — Other Ambulatory Visit (HOSPITAL_BASED_OUTPATIENT_CLINIC_OR_DEPARTMENT_OTHER): Payer: Self-pay

## 2021-04-24 DIAGNOSIS — E039 Hypothyroidism, unspecified: Secondary | ICD-10-CM | POA: Diagnosis not present

## 2021-04-24 DIAGNOSIS — M109 Gout, unspecified: Secondary | ICD-10-CM | POA: Diagnosis not present

## 2021-04-24 DIAGNOSIS — E785 Hyperlipidemia, unspecified: Secondary | ICD-10-CM | POA: Diagnosis not present

## 2021-04-25 DIAGNOSIS — Z1152 Encounter for screening for COVID-19: Secondary | ICD-10-CM | POA: Diagnosis not present

## 2021-04-25 DIAGNOSIS — R051 Acute cough: Secondary | ICD-10-CM | POA: Diagnosis not present

## 2021-04-25 DIAGNOSIS — N1831 Chronic kidney disease, stage 3a: Secondary | ICD-10-CM | POA: Diagnosis not present

## 2021-04-25 DIAGNOSIS — J069 Acute upper respiratory infection, unspecified: Secondary | ICD-10-CM | POA: Diagnosis not present

## 2021-04-25 DIAGNOSIS — I480 Paroxysmal atrial fibrillation: Secondary | ICD-10-CM | POA: Diagnosis not present

## 2021-04-25 DIAGNOSIS — I129 Hypertensive chronic kidney disease with stage 1 through stage 4 chronic kidney disease, or unspecified chronic kidney disease: Secondary | ICD-10-CM | POA: Diagnosis not present

## 2021-04-30 ENCOUNTER — Other Ambulatory Visit: Payer: Self-pay

## 2021-04-30 DIAGNOSIS — I10 Essential (primary) hypertension: Secondary | ICD-10-CM

## 2021-04-30 DIAGNOSIS — E782 Mixed hyperlipidemia: Secondary | ICD-10-CM

## 2021-04-30 DIAGNOSIS — I480 Paroxysmal atrial fibrillation: Secondary | ICD-10-CM

## 2021-04-30 MED ORDER — HYDROCHLOROTHIAZIDE 25 MG PO TABS: 25.0000 mg | ORAL_TABLET | Freq: Every day | ORAL | 0 refills | Status: DC

## 2021-05-01 DIAGNOSIS — I2510 Atherosclerotic heart disease of native coronary artery without angina pectoris: Secondary | ICD-10-CM | POA: Diagnosis not present

## 2021-05-01 DIAGNOSIS — I129 Hypertensive chronic kidney disease with stage 1 through stage 4 chronic kidney disease, or unspecified chronic kidney disease: Secondary | ICD-10-CM | POA: Diagnosis not present

## 2021-05-01 DIAGNOSIS — J069 Acute upper respiratory infection, unspecified: Secondary | ICD-10-CM | POA: Diagnosis not present

## 2021-05-01 DIAGNOSIS — Z1339 Encounter for screening examination for other mental health and behavioral disorders: Secondary | ICD-10-CM | POA: Diagnosis not present

## 2021-05-01 DIAGNOSIS — E039 Hypothyroidism, unspecified: Secondary | ICD-10-CM | POA: Diagnosis not present

## 2021-05-01 DIAGNOSIS — I480 Paroxysmal atrial fibrillation: Secondary | ICD-10-CM | POA: Diagnosis not present

## 2021-05-01 DIAGNOSIS — R7301 Impaired fasting glucose: Secondary | ICD-10-CM | POA: Diagnosis not present

## 2021-05-01 DIAGNOSIS — D6869 Other thrombophilia: Secondary | ICD-10-CM | POA: Diagnosis not present

## 2021-05-01 DIAGNOSIS — N1831 Chronic kidney disease, stage 3a: Secondary | ICD-10-CM | POA: Diagnosis not present

## 2021-05-01 DIAGNOSIS — Z7901 Long term (current) use of anticoagulants: Secondary | ICD-10-CM | POA: Diagnosis not present

## 2021-05-01 DIAGNOSIS — Z0000 Encounter for general adult medical examination without abnormal findings: Secondary | ICD-10-CM | POA: Diagnosis not present

## 2021-05-01 DIAGNOSIS — E785 Hyperlipidemia, unspecified: Secondary | ICD-10-CM | POA: Diagnosis not present

## 2021-05-01 DIAGNOSIS — Z1331 Encounter for screening for depression: Secondary | ICD-10-CM | POA: Diagnosis not present

## 2021-05-01 NOTE — Progress Notes (Deleted)
Cardiology Office Note:    Date:  05/01/2021   ID:  Elizabeth Wang, DOB 09-Apr-1946, MRN [id number]  PCP:  Ginger Organ., MD   [facility] Providers Cardiologist:  [name], MD (Inactive) Electrophysiologist:  Vickie Epley, MD  Sleep Medicine:  Fransico Him, MD {  Referring MD: Ginger Organ., MD    History of Present Illness:    Elizabeth Wang is a 75 y.o. female with a hx of paroxysmal Afib, HTN, right leg DVT and PE 06/2013 who was previously followed by Dr. Meda Coffee who now presents to clinic for follow-up.  Per review of the record, patient has history of isolated Afib in 06/2013 but recurred after receiving COVID vaccine and she was placed on xarelto. Has been followed by Dr. [name] and currently pursuing a rate control strategy at this time. Dilt recently increased. If fails rate control and has persistent symptoms, may proceed with ablation. Recent TTE 06/2020 with normal LVEF 60%, moderate RA and LA dilation.   Past Medical History:  Diagnosis Date   Acute pulmonary embolism ([place]) 06/2013   Arthritis    Asthma    years ago    Clotting disorder (HCC)    DVT/PE   Diverticulosis    minor    Dyslipidemia    Esophageal reflux    Hypertension    Hypothyroidism    Numbness    TOES   Obesity    OSA (obstructive sleep apnea)    severe OSA with an AHI of 51/hr and nocturnal hypoxemia with O2 sats at low as 69%.   Paroxysmal atrial fibrillation (HCC)    1 isolated episode in the setting of acute stress with no further episodes.    Pseudogout     Past Surgical History:  Procedure Laterality Date   BREAST RECONSTRUCTION     CARDIOVERSION N/A 06/27/2020   Procedure: CARDIOVERSION;  Surgeon: [name], MD;  Location: [place] ENDOSCOPY;  Service: Cardiovascular;  Laterality: N/A;   CESAREAN SECTION     COLONOSCOPY  2004   repeat in ten years   KNEE ARTHROSCOPY Left    TONSILLECTOMY     TOTAL KNEE ARTHROPLASTY Right 01/01/2014   Procedure: TOTAL RIGHT KNEE  ARTHROPLASTY;  Surgeon: [name], MD;  Location: [facility] ORS;  Service: Orthopedics;  Laterality: Right;   TOTAL KNEE ARTHROPLASTY Left 07/23/2014   Procedure: LEFT TOTAL KNEE ARTHROPLASTY;  Surgeon: [name], MD;  Location: [facility] ORS;  Service: Orthopedics;  Laterality: Left;   tummy tuck  1987   WISDOM TOOTH EXTRACTION      Current Medications: No outpatient medications have been marked as taking for the 05/06/21 encounter (Appointment) with Freada Bergeron, MD.     Allergies:   Nsaids, Bee venom, Other, and Codeine   Social History   Socioeconomic History   Marital status: Married    Spouse name: Not on file   Number of children: Not on file   Years of education: Not on file   Highest education level: Not on file  Occupational History   Occupation: Music therapist  Tobacco Use   Smoking status: Former    Packs/day: 0.25    Years: 12.00    Pack years: 3.00    Types: Cigarettes    Quit date: 12/28/1975    Years since quitting: 45.3   Smokeless tobacco: Never  Substance and Sexual Activity   Alcohol use: Yes    Alcohol/week: 4.0 standard drinks    Types: 4  Glasses of wine per week    Comment: socially   Drug use: No   Sexual activity: Not on file  Other Topics Concern   Not on file  Social History Narrative   Lives in [place] with her husband. Exercises at the gym 4 times a week.   Social Determinants of Health   Financial Resource Strain: Not on file  Food Insecurity: Not on file  Transportation Needs: Not on file  Physical Activity: Not on file  Stress: Not on file  Social Connections: Not on file     Family History: The patient's ***family history includes CVA in her mother; Coronary artery disease in her father; Heart disease in her father; Hypertension in her father.  ROS:   Please see the history of present illness.    *** All other systems reviewed and are negative.  EKGs/Labs/Other Studies Reviewed:    The following studies were  reviewed today:  TTE: 07/19/2020  1. Left ventricular ejection fraction, by estimation, is 60 to 65%. The  left ventricle has normal function. The left ventricle has no regional  wall motion abnormalities. There is mild left ventricular hypertrophy.  Left ventricular diastolic parameters  are indeterminate.   2. Right ventricular systolic function is normal. The right ventricular  size is normal.   3. Left atrial size was moderately dilated.   4. Right atrial size was moderately dilated.   5. The mitral valve is normal in structure. No evidence of mitral valve  regurgitation. No evidence of mitral stenosis.   6. The aortic valve is tricuspid. Aortic valve regurgitation is not  visualized. Mild aortic valve stenosis. Aortic valve mean gradient  measures 13.0 mmHg.   7. Aortic dilatation noted. There is mild dilatation of the ascending  aorta, measuring 37 mm.   8. The inferior vena cava is normal in size with greater than 50%  respiratory variability, suggesting right atrial pressure of 3 mmHg.   9. The patient is in atrial fibrillation.   EKG:  EKG is *** ordered today.  The ekg ordered today demonstrates ***  Recent Labs: 06/20/2020: BUN 28; Creatinine, Ser 1.22; Hemoglobin 13.5; Platelets 180; Potassium 3.7; Sodium 134  Recent Lipid Panel    Component Value Date/Time   CHOL 138 12/01/2019 1529   TRIG 168 (H) 12/01/2019 1529   HDL 46 12/01/2019 1529   CHOLHDL 3.0 12/01/2019 1529   LDLCALC 64 12/01/2019 1529     Risk Assessment/Calculations:   {Does this patient have ATRIAL FIBRILLATION?:[id number]}       Physical Exam:    VS:  LMP  (LMP Unknown)     Wt Readings from Last 3 Encounters:  02/18/21 234 lb 9.6 oz (106.4 kg)  10/31/20 233 lb (105.7 kg)  07/04/20 226 lb 12.8 oz (102.9 kg)     GEN: *** Well nourished, well developed in no acute distress HEENT: Normal NECK: No JVD; No carotid bruits LYMPHATICS: No lymphadenopathy CARDIAC: ***RRR, no murmurs, rubs,  gallops RESPIRATORY:  Clear to auscultation without rales, wheezing or rhonchi  ABDOMEN: Soft, non-tender, non-distended MUSCULOSKELETAL:  No edema; No deformity  SKIN: Warm and dry NEUROLOGIC:  Alert and oriented x 3 PSYCHIATRIC:  Normal affect   ASSESSMENT:    No diagnosis found. PLAN:    In order of problems listed above:  #Persistent Afib: CHADs-vasc 3. On xarelto for [facility]. Followed by Dr. [name]. Plan for rate control strategy for now with consideration of ablation if symptoms persist. TTE with normal LVEF, moderately dilated RA/LA.  -  Continue dilt 360mg  daily -Continue xarelto 20mg  daily -Follow-up with Dr. [name] as scheduled  #HTN: -Continue telmisartan 80mg  daily -Continue HCTZ 25mg  daily  #HLD: -Continue crestor 10mg  daily -Lipids at goal with goal LDL 76, TG 107, HDL 46  #OSA: -Continue BiPAP   {Are you ordering a CV Procedure (e.g. stress test, cath, DCCV, TEE, etc)?   Press F2        :[id number]}    Medication Adjustments/Labs and Tests Ordered: Current medicines are reviewed at length with the patient today.  Concerns regarding medicines are outlined above.  No orders of the defined types were placed in this encounter.  No orders of the defined types were placed in this encounter.   There are no Patient Instructions on file for this visit.   Signed, Freada Bergeron, MD  05/01/2021 2:50 PM    [facility]

## 2021-05-06 ENCOUNTER — Ambulatory Visit (INDEPENDENT_AMBULATORY_CARE_PROVIDER_SITE_OTHER): Payer: Medicare Other | Admitting: Cardiology

## 2021-05-06 ENCOUNTER — Encounter: Payer: Self-pay | Admitting: Cardiology

## 2021-05-06 ENCOUNTER — Other Ambulatory Visit: Payer: Self-pay

## 2021-05-06 VITALS — BP 126/80 | HR 82 | Ht 63.5 in | Wt 219.0 lb

## 2021-05-06 DIAGNOSIS — I4819 Other persistent atrial fibrillation: Secondary | ICD-10-CM | POA: Diagnosis not present

## 2021-05-06 DIAGNOSIS — R0609 Other forms of dyspnea: Secondary | ICD-10-CM

## 2021-05-06 DIAGNOSIS — I10 Essential (primary) hypertension: Secondary | ICD-10-CM

## 2021-05-06 DIAGNOSIS — E782 Mixed hyperlipidemia: Secondary | ICD-10-CM

## 2021-05-06 DIAGNOSIS — G4733 Obstructive sleep apnea (adult) (pediatric): Secondary | ICD-10-CM

## 2021-05-06 DIAGNOSIS — E669 Obesity, unspecified: Secondary | ICD-10-CM | POA: Diagnosis not present

## 2021-05-06 DIAGNOSIS — R0600 Dyspnea, unspecified: Secondary | ICD-10-CM

## 2021-05-06 NOTE — Progress Notes (Signed)
Cardiology Office Note:    Date:  05/06/2021   ID:  Elizabeth Wang, DOB November 03, 1945, MRN [id number]  PCP:  Ginger Organ., MD   [facility] Providers Cardiologist:  [name], MD (Inactive) Electrophysiologist:  Vickie Epley, MD  Sleep Medicine:  Fransico Him, MD  {  Referring MD: Ginger Organ., MD    History of Present Illness:    Elizabeth Wang is a 75 y.o. female with a hx of paroxysmal Afib, HTN, right leg DVT and PE 06/2013 who was previously followed by Dr. Meda Coffee who now presents to clinic for follow-up.  Per review of the record, patient has history of isolated Afib in 06/2013 but recurred after receiving COVID vaccine and she was placed on xarelto. Has been followed by Dr. [name] and currently pursuing a rate control strategy at this time. Dilt recently increased. If fails rate control and has persistent symptoms, may proceed with ablation. Recent TTE 06/2020 with normal LVEF 60%, moderate RA and LA dilation.   Today, she is feeling okay overall. She has been tolerating the higher dose of the diltiazem. Blood pressure running on the lower side but no orthostatic symptoms. States her heart rates have not been as well controlled as she has hoped on the medication but she also has notably had a viral URI for the past couple of weeks. Lately, it is difficult for her to determine what symptoms are due to Afib or are caused by a recent viral infection. She has also been experiencing fatigue, cough, and congestion from this possible viral illness.  When she next sees Dr. [name] she is expecting him to recommend going forward with the ablation procedure. We discussed this is a good option for her but she can wait until she feels better and is able to tolerate her BiPAP again.   Otherwise, she is doing well and is working on weight loss. For exercise, she has been going to [employer] at [employer] where she swims and does water aerobics. Her goal is to reach 200 pounds, and  her all-time highest weight has been 287. At home this morning, she was down 19.5 pounds since early 02/2021.  She denies any chest pain, shortness of breath, palpitations, or exertional symptoms. No headaches, lightheadedness, or syncope to report. Also has no lower extremity edema, orthopnea or PND.   Past Medical History:  Diagnosis Date   Acute pulmonary embolism ([place]) 06/2013   Arthritis    Asthma    years ago    Clotting disorder (HCC)    DVT/PE   Diverticulosis    minor    Dyslipidemia    Esophageal reflux    Hypertension    Hypothyroidism    Numbness    TOES   Obesity    OSA (obstructive sleep apnea)    severe OSA with an AHI of 51/hr and nocturnal hypoxemia with O2 sats at low as 69%.   Paroxysmal atrial fibrillation (HCC)    1 isolated episode in the setting of acute stress with no further episodes.    Pseudogout     Past Surgical History:  Procedure Laterality Date   BREAST RECONSTRUCTION     CARDIOVERSION N/A 06/27/2020   Procedure: CARDIOVERSION;  Surgeon: [name], MD;  Location: [facility];  Service: Cardiovascular;  Laterality: N/A;   CESAREAN SECTION     COLONOSCOPY  2004   repeat in ten years   KNEE ARTHROSCOPY Left    TONSILLECTOMY  TOTAL KNEE ARTHROPLASTY Right 01/01/2014   Procedure: TOTAL RIGHT KNEE ARTHROPLASTY;  Surgeon: [name], MD;  Location: [facility];  Service: Orthopedics;  Laterality: Right;   TOTAL KNEE ARTHROPLASTY Left 07/23/2014   Procedure: LEFT TOTAL KNEE ARTHROPLASTY;  Surgeon: [name], MD;  Location: [facility];  Service: Orthopedics;  Laterality: Left;   tummy tuck  1987   WISDOM TOOTH EXTRACTION      Current Medications: Current Meds  Medication Sig   acetaminophen (TYLENOL) 500 MG tablet Take 1,000 mg by mouth every 8 (eight) hours as needed for moderate pain or headache.   ASPERCREME LIDOCAINE EX Apply 1 application topically daily as needed (arthritis pain).   cetirizine (ZYRTEC) 10 MG tablet Take 10 mg by  mouth daily as needed for allergies.    colchicine 0.6 MG tablet Take 0.6 mg by mouth at bedtime. For gout   COVID-19 mRNA vaccine, Moderna, 100 MCG/0.5ML injection Inject into the muscle.   diltiazem (CARDIZEM CD) 360 MG 24 hr capsule Take 1 capsule (360 mg total) by mouth daily.   EPINEPHrine 0.3 mg/0.3 mL IJ SOAJ injection Inject 0.3 mg into the muscle as needed for anaphylaxis (Allergic Reaction).    fluticasone (FLONASE) 50 MCG/ACT nasal spray Place 1-2 sprays into both nostrils daily as needed for allergies.    hydrochlorothiazide (HYDRODIURIL) 25 MG tablet Take 1 tablet (25 mg total) by mouth daily. Please keep upcoming appt in July 2022 with Dr. Johney Frame before anymore refills. Thank you   levothyroxine (SYNTHROID, LEVOTHROID) 125 MCG tablet Take 125 mcg by mouth daily before breakfast. For thyroid therapy   ondansetron (ZOFRAN-ODT) 4 MG disintegrating tablet Take 4 mg by mouth 3 (three) times daily as needed (nausea/vomiting). For N&V   rosuvastatin (CRESTOR) 10 MG tablet TAKE ONE TABLET DAILY   telmisartan (MICARDIS) 80 MG tablet Take 80 mg by mouth daily.   valACYclovir (VALTREX) 1000 MG tablet Take 1,000 mg by mouth daily as needed (outbreaks).    XARELTO 20 MG TABS tablet TAKE ONE TABLET EACH DAY WITH SUPPER     Allergies:   Nsaids, Bee venom, Other, and Codeine   Social History   Socioeconomic History   Marital status: Married    Spouse name: Not on file   Number of children: Not on file   Years of education: Not on file   Highest education level: Not on file  Occupational History   Occupation: Music therapist  Tobacco Use   Smoking status: Former    Packs/day: 0.25    Years: 12.00    Pack years: 3.00    Types: Cigarettes    Quit date: 12/28/1975    Years since quitting: 45.3   Smokeless tobacco: Never  Substance and Sexual Activity   Alcohol use: Yes    Alcohol/week: 4.0 standard drinks    Types: 4 Glasses of wine per week    Comment: socially   Drug use: No    Sexual activity: Not on file  Other Topics Concern   Not on file  Social History Narrative   Lives in [place] with her husband. Exercises at the gym 4 times a week.   Social Determinants of Health   Financial Resource Strain: Not on file  Food Insecurity: Not on file  Transportation Needs: Not on file  Physical Activity: Not on file  Stress: Not on file  Social Connections: Not on file     Family History: The patient's family history includes CVA in her mother; Coronary artery  disease in her father; Heart disease in her father; Hypertension in her father.  ROS:   Review of Systems  Constitutional:  Positive for malaise/fatigue. Negative for fever.  HENT:  Positive for congestion. Negative for nosebleeds and sore throat.   Eyes:  Negative for pain and discharge.  Respiratory:  Positive for cough. Negative for sputum production and wheezing.   Cardiovascular:  Negative for chest pain, palpitations, orthopnea, claudication, leg swelling and PND.  Gastrointestinal:  Negative for blood in stool and diarrhea.  Genitourinary:  Negative for hematuria and urgency.  Musculoskeletal:  Negative for falls and joint pain.  Neurological:  Negative for tremors and headaches.  Endo/Heme/Allergies:  Negative for environmental allergies.  Psychiatric/Behavioral:  Negative for depression and memory loss.     EKGs/Labs/Other Studies Reviewed:    The following studies were reviewed today:  TTE: 07/19/2020  1. Left ventricular ejection fraction, by estimation, is 60 to 65%. The  left ventricle has normal function. The left ventricle has no regional  wall motion abnormalities. There is mild left ventricular hypertrophy.  Left ventricular diastolic parameters  are indeterminate.   2. Right ventricular systolic function is normal. The right ventricular  size is normal.   3. Left atrial size was moderately dilated.   4. Right atrial size was moderately dilated.   5. The mitral valve is  normal in structure. No evidence of mitral valve  regurgitation. No evidence of mitral stenosis.   6. The aortic valve is tricuspid. Aortic valve regurgitation is not  visualized. Mild aortic valve stenosis. Aortic valve mean gradient  measures 13.0 mmHg.   7. Aortic dilatation noted. There is mild dilatation of the ascending  aorta, measuring 37 mm.   8. The inferior vena cava is normal in size with greater than 50%  respiratory variability, suggesting right atrial pressure of 3 mmHg.   9. The patient is in atrial fibrillation.   EKG:   05/06/2021: EKG is not ordered today.   Recent Labs: 06/20/2020: BUN 28; Creatinine, Ser 1.22; Hemoglobin 13.5; Platelets 180; Potassium 3.7; Sodium 134  Recent Lipid Panel    Component Value Date/Time   CHOL 138 12/01/2019 1529   TRIG 168 (H) 12/01/2019 1529   HDL 46 12/01/2019 1529   CHOLHDL 3.0 12/01/2019 1529   LDLCALC 64 12/01/2019 1529     Risk Assessment/Calculations:    CHA2DS2-VASc Score = 4  This indicates a 4.8% annual risk of stroke. The patient's score is based upon: CHF History: No HTN History: Yes Diabetes History: No Stroke History: No Vascular Disease History: No Age Score: 2 Gender Score: 1        Physical Exam:    VS:  BP 126/80   Pulse 82   Ht 5' 3.5" (1.613 m)   Wt 219 lb (99.3 kg)   LMP  (LMP Unknown)   SpO2 97%   BMI 38.19 kg/m     Wt Readings from Last 3 Encounters:  05/06/21 219 lb (99.3 kg)  02/18/21 234 lb 9.6 oz (106.4 kg)  10/31/20 233 lb (105.7 kg)     GEN: Well nourished, well developed in no acute distress HEENT: Normal NECK: No JVD; No carotid bruits CARDIAC: Irregularly irregular no murmurs, rubs, gallops RESPIRATORY:  Clear to auscultation without rales, wheezing or rhonchi  ABDOMEN: Soft, non-tender, non-distended MUSCULOSKELETAL:  No edema; No deformity  SKIN: Warm and dry NEUROLOGIC:  Alert and oriented x 3 PSYCHIATRIC:  Normal affect   ASSESSMENT:    1. Persistent atrial  fibrillation ([place])   2. Primary hypertension   3. OSA (obstructive sleep apnea)   4. Essential hypertension, benign   5. DOE (dyspnea on exertion)   6. Obesity (BMI 35.0-39.9 without comorbidity)   7. Mixed hyperlipidemia    PLAN:    In order of problems listed above:  #Persistent Afib: CHADs-vasc 4. On xarelto for [facility]. Followed by Dr. [name]. Plan for rate control strategy for now with consideration of ablation if symptoms persist. TTE with normal LVEF, moderately dilated RA/LA.  -Continue dilt 360mg  daily -Continue xarelto 20mg  daily -Follow-up with Dr. [name] as scheduled  #HTN: Blood pressure well controlled <120/80s. No orthostatic symptoms.  -Continue telmisartan 80mg  daily -Continue HCTZ 25mg  daily -Continue dilt 360mg  daily  #HLD: -Continue crestor 10mg  daily -Lipids at goal with goal LDL 63  -Lipids followed by PCP  #OSA: -Continue BiPAP  #Obesity: BMI 38 Working hard on weight loss. Currently on Noom diet plan. Hoping to resume swimming and water aerobics once her cold clears up. -Continue Noom diet -Resume exercise when able with goal >133min per week  Exercise recommendations: Goal of exercising for at least 30 minutes a day, at least 5 times per week.  Please exercise to a moderate exertion.  This means that while exercising it is difficult to speak in full sentences, however you are not so short of breath that you feel you must stop, and not so comfortable that you can carry on a full conversation.  Exertion level should be approximately a 5/10, if 10 is the most exertion you can perform.  Diet recommendations: Recommend a heart healthy diet such as the Mediterranean diet.  This diet consists of plant based foods, healthy fats, lean meats, olive oil.  It suggests limiting the intake of simple carbohydrates such as white breads, pastries, and pastas.  It also limits the amount of red meat, wine, and dairy products such as cheese that one should consume on a  daily basis.       Follow-up in 6 months.  Medication Adjustments/Labs and Tests Ordered: Current medicines are reviewed at length with the patient today.  Concerns regarding medicines are outlined above.  No orders of the defined types were placed in this encounter.  No orders of the defined types were placed in this encounter.   Patient Instructions  Medication Instructions:   Your physician recommends that you continue on your current medications as directed. Please refer to the Current Medication list given to you today.  *If you need a refill on your cardiac medications before your next appointment, please call your pharmacy*   Follow-Up: At [facility], you and your health needs are our priority.  As part of our continuing mission to provide you with exceptional heart care, we have created designated Provider Care Teams.  These Care Teams include your primary Cardiologist (physician) and Advanced Practice Providers (APPs -  Physician Assistants and Nurse Practitioners) who all work together to provide you with the care you need, when you need it.  We recommend signing up for the patient portal called "MyChart".  Sign up information is provided on this After Visit Summary.  MyChart is used to connect with patients for Virtual Visits (Telemedicine).  Patients are able to view lab/test results, encounter notes, upcoming appointments, etc.  Non-urgent messages can be sent to your provider as well.   To learn more about what you can do with MyChart, go to [email or web address].    Your next appointment:   6 month(s)  The  format for your next appointment:   In Person  Provider:   Gwyndolyn Kaufman, MD       [facility],acting as a scribe for Freada Bergeron, MD.,have documented all relevant documentation on the behalf of Freada Bergeron, MD,as directed by  Freada Bergeron, MD while in the presence of Freada Bergeron, MD.  I, Freada Bergeron, MD,  have reviewed all documentation for this visit. The documentation on 05/06/21 for the exam, diagnosis, procedures, and orders are all accurate and complete.   Signed, Freada Bergeron, MD  05/06/2021 9:07 AM    [name]

## 2021-05-06 NOTE — Patient Instructions (Signed)
Medication Instructions:   Your physician recommends that you continue on your current medications as directed. Please refer to the Current Medication list given to you today.  *If you need a refill on your cardiac medications before your next appointment, please call your pharmacy*   Follow-Up: At [facility], you and your health needs are our priority.  As part of our continuing mission to provide you with exceptional heart care, we have created designated Provider Care Teams.  These Care Teams include your primary Cardiologist (physician) and Advanced Practice Providers (APPs -  Physician Assistants and Nurse Practitioners) who all work together to provide you with the care you need, when you need it.  We recommend signing up for the patient portal called "MyChart".  Sign up information is provided on this After Visit Summary.  MyChart is used to connect with patients for Virtual Visits (Telemedicine).  Patients are able to view lab/test results, encounter notes, upcoming appointments, etc.  Non-urgent messages can be sent to your provider as well.   To learn more about what you can do with MyChart, go to [email or web address].    Your next appointment:   6 month(s)  The format for your next appointment:   In Person  Provider:   Heather Pemberton, MD     

## 2021-05-22 NOTE — Progress Notes (Signed)
Electrophysiology Office Follow up Visit Note:    Date:  05/23/2021   ID:  Elizabeth Wang, DOB 10-20-1946, MRN [id number]  PCP:  Ginger Organ., MD  [facility] Cardiologist:  [name], MD (Inactive)  [facility] Electrophysiologist:  Vickie Epley, MD    Interval History:    Elizabeth Wang is a 75 y.o. female who presents for a follow up visit. They were last seen in clinic 02/18/2021 for persistent atrial fibrillation. She takes xarelto for stroke prophylaxis. At that appointment we discussed the PVI procedure in detail but she wanted to wait and try an increased dose of diltiazem. She saw Dr Johney Frame on 05/06/2021 in follow up. At that appointment she was tolerating the increased diltiazem dose. She continued to report little to no symptoms with her AF.  She tells me that sometimes she will be at work and her watch will give her a low heart rate warning for sustained heart rates in the 60s.  She is asymptomatic during these times.  She is tolerating her Xarelto without bleeding issues.  She tells me she is still having trouble with CPAP compliance.  She plans to discuss this further with Dr. Radford Pax.   Past Medical History:  Diagnosis Date   Acute pulmonary embolism ([place]) 06/2013   Arthritis    Asthma    years ago    Clotting disorder (HCC)    DVT/PE   Diverticulosis    minor    Dyslipidemia    Esophageal reflux    Hypertension    Hypothyroidism    Numbness    TOES   Obesity    OSA (obstructive sleep apnea)    severe OSA with an AHI of 51/hr and nocturnal hypoxemia with O2 sats at low as 69%.   Paroxysmal atrial fibrillation (HCC)    1 isolated episode in the setting of acute stress with no further episodes.    Pseudogout     Past Surgical History:  Procedure Laterality Date   BREAST RECONSTRUCTION     CARDIOVERSION N/A 06/27/2020   Procedure: CARDIOVERSION;  Surgeon: [name], MD;  Location: [facility];  Service: Cardiovascular;  Laterality: N/A;    CESAREAN SECTION     COLONOSCOPY  2004   repeat in ten years   KNEE ARTHROSCOPY Left    TONSILLECTOMY     TOTAL KNEE ARTHROPLASTY Right 01/01/2014   Procedure: TOTAL RIGHT KNEE ARTHROPLASTY;  Surgeon: [name], MD;  Location: [facility];  Service: Orthopedics;  Laterality: Right;   TOTAL KNEE ARTHROPLASTY Left 07/23/2014   Procedure: LEFT TOTAL KNEE ARTHROPLASTY;  Surgeon: [name], MD;  Location: [facility];  Service: Orthopedics;  Laterality: Left;   tummy tuck  1987   WISDOM TOOTH EXTRACTION      Current Medications: Current Meds  Medication Sig   acetaminophen (TYLENOL) 500 MG tablet Take 1,000 mg by mouth every 8 (eight) hours as needed for moderate pain or headache.   ASPERCREME LIDOCAINE EX Apply 1 application topically daily as needed (arthritis pain).   cetirizine (ZYRTEC) 10 MG tablet Take 10 mg by mouth daily as needed for allergies.    colchicine 0.6 MG tablet Take 0.6 mg by mouth at bedtime. For gout   COVID-19 mRNA vaccine, Moderna, 100 MCG/0.5ML injection Inject into the muscle.   diltiazem (CARDIZEM CD) 360 MG 24 hr capsule Take 1 capsule (360 mg total) by mouth daily.   EPINEPHrine 0.3 mg/0.3 mL IJ SOAJ injection Inject 0.3 mg into  the muscle as needed for anaphylaxis (Allergic Reaction).    fluticasone (FLONASE) 50 MCG/ACT nasal spray Place 1-2 sprays into both nostrils daily as needed for allergies.    hydrochlorothiazide (HYDRODIURIL) 25 MG tablet Take 1 tablet (25 mg total) by mouth daily. Please keep upcoming appt in July 2022 with Dr. Johney Frame before anymore refills. Thank you   levothyroxine (SYNTHROID, LEVOTHROID) 125 MCG tablet Take 125 mcg by mouth daily before breakfast. For thyroid therapy   ondansetron (ZOFRAN-ODT) 4 MG disintegrating tablet Take 4 mg by mouth 3 (three) times daily as needed (nausea/vomiting). For N&V   rosuvastatin (CRESTOR) 10 MG tablet TAKE ONE TABLET DAILY   telmisartan (MICARDIS) 80 MG tablet Take 80 mg by mouth daily.    valACYclovir (VALTREX) 1000 MG tablet Take 1,000 mg by mouth daily as needed (outbreaks).    XARELTO 20 MG TABS tablet TAKE ONE TABLET EACH DAY WITH SUPPER     Allergies:   Nsaids, Bee venom, Other, and Codeine   Social History   Socioeconomic History   Marital status: Married    Spouse name: Not on file   Number of children: Not on file   Years of education: Not on file   Highest education level: Not on file  Occupational History   Occupation: Music therapist  Tobacco Use   Smoking status: Former    Packs/day: 0.25    Years: 12.00    Pack years: 3.00    Types: Cigarettes    Quit date: 12/28/1975    Years since quitting: 45.4   Smokeless tobacco: Never  Substance and Sexual Activity   Alcohol use: Yes    Alcohol/week: 4.0 standard drinks    Types: 4 Glasses of wine per week    Comment: socially   Drug use: No   Sexual activity: Not on file  Other Topics Concern   Not on file  Social History Narrative   Lives in [place] with her husband. Exercises at the gym 4 times a week.   Social Determinants of Health   Financial Resource Strain: Not on file  Food Insecurity: Not on file  Transportation Needs: Not on file  Physical Activity: Not on file  Stress: Not on file  Social Connections: Not on file     Family History: The patient's family history includes CVA in her mother; Coronary artery disease in her father; Heart disease in her father; Hypertension in her father.  ROS:   Please see the history of present illness.    All other systems reviewed and are negative.  EKGs/Labs/Other Studies Reviewed:    The following studies were reviewed today:  07/19/2020 Echo EF 60% RV normal LA/RA dilation   EKG:  The ekg ordered today demonstrates atrial fibrillation with a ventricular rate of 88 bpm.  Recent Labs: 06/20/2020: BUN 28; Creatinine, Ser 1.22; Hemoglobin 13.5; Platelets 180; Potassium 3.7; Sodium 134  Recent Lipid Panel    Component Value Date/Time    CHOL 138 12/01/2019 1529   TRIG 168 (H) 12/01/2019 1529   HDL 46 12/01/2019 1529   CHOLHDL 3.0 12/01/2019 1529   LDLCALC 64 12/01/2019 1529    Physical Exam:    VS:  BP 132/82   Pulse 88   Ht 5' 3.5" (1.613 m)   Wt 218 lb 12.8 oz (99.2 kg)   LMP  (LMP Unknown)   SpO2 96%   BMI 38.15 kg/m     Wt Readings from Last 3 Encounters:  05/23/21 218 lb 12.8 oz (99.2  kg)  05/06/21 219 lb (99.3 kg)  02/18/21 234 lb 9.6 oz (106.4 kg)     GEN:  Well nourished, well developed in no acute distress.  Obese HEENT: Normal NECK: No JVD; No carotid bruits LYMPHATICS: No lymphadenopathy CARDIAC: Irregularly irregular, no murmurs, rubs, gallops RESPIRATORY:  Clear to auscultation without rales, wheezing or rhonchi  ABDOMEN: Soft, non-tender, non-distended MUSCULOSKELETAL:  No edema; No deformity  SKIN: Warm and dry NEUROLOGIC:  Alert and oriented x 3 PSYCHIATRIC:  Normal affect   ASSESSMENT:    1. Persistent atrial fibrillation ([place])   2. Primary hypertension   3. OSA (obstructive sleep apnea)    PLAN:    In order of problems listed above:  1. Persistent atrial fibrillation ([place]) Relatively asymptomatic.  Doing well on diltiazem for rate control and Xarelto for stroke prophylaxis.  We discussed ablation during today's visit including the risks, success rates, recovery.  For now we mutually decided to continue with rate control and stroke prophylaxis.  She will continue to work on CPAP compliance.  I would definitely want her CPAP compliance to be better before we were to ever consider ablation.  Given the asymptomatic nature of her atrial fibrillation and normal LV function, I think a rate control strategy is very reasonable.  I will have her follow-up with one of the PA/NP's in 6 months to make sure that she is still doing well and to reassess her CPAP compliance.  She will let us know in the meantime if she wants to schedule an ablation.  If her rate control strategy is decided upon, and  continue follow-up intermittently with Dr. Johney Frame.  2. Primary hypertension Controlled.  Continue current regimen.  3. OSA (obstructive sleep apnea) CPAP compliance encouraged.  Patient plans to discuss this with Dr. Radford Pax.    Total time spent with patient today 30 minutes. This includes reviewing records, evaluating the patient and coordinating care.   Medication Adjustments/Labs and Tests Ordered: Current medicines are reviewed at length with the patient today.  Concerns regarding medicines are outlined above.  Orders Placed This Encounter  Procedures   EKG 12-Lead    No orders of the defined types were placed in this encounter.    Signed, Lars Mage, MD, [facility], [facility] 05/23/2021 8:55 AM    Electrophysiology [facility]

## 2021-05-23 ENCOUNTER — Ambulatory Visit (INDEPENDENT_AMBULATORY_CARE_PROVIDER_SITE_OTHER): Payer: Medicare Other | Admitting: Cardiology

## 2021-05-23 ENCOUNTER — Encounter: Payer: Self-pay | Admitting: Cardiology

## 2021-05-23 ENCOUNTER — Other Ambulatory Visit: Payer: Self-pay

## 2021-05-23 VITALS — BP 132/82 | HR 88 | Ht 63.5 in | Wt 218.8 lb

## 2021-05-23 DIAGNOSIS — I4819 Other persistent atrial fibrillation: Secondary | ICD-10-CM | POA: Diagnosis not present

## 2021-05-23 DIAGNOSIS — I10 Essential (primary) hypertension: Secondary | ICD-10-CM | POA: Diagnosis not present

## 2021-05-23 DIAGNOSIS — G4733 Obstructive sleep apnea (adult) (pediatric): Secondary | ICD-10-CM

## 2021-05-23 NOTE — Patient Instructions (Addendum)
Medication Instructions:  Your physician recommends that you continue on your current medications as directed. Please refer to the Current Medication list given to you today. *If you need a refill on your cardiac medications before your next appointment, please call your pharmacy*  Lab Work: None ordered. If you have labs (blood work) drawn today and your tests are completely normal, you will receive your results only by: [place] (if you have MyChart) OR A paper copy in the mail If you have any lab test that is abnormal or we need to change your treatment, we will call you to review the results.  Testing/Procedures: None ordered.  Follow-Up: At [facility], you and your health needs are our priority.  As part of our continuing mission to provide you with exceptional heart care, we have created designated Provider Care Teams.  These Care Teams include your primary Cardiologist (physician) and Advanced Practice Providers (APPs -  Physician Assistants and Nurse Practitioners) who all work together to provide you with the care you need, when you need it.  Your next appointment:   Your physician wants you to follow-up in: 6 months with one of the following Advanced Practice Providers on your designated Care Team:   Tommye Standard, [place] Legrand Como "Jonni Sanger" [place], [place] You will receive a reminder letter in the mail two months in advance. If you don't receive a letter, please call our office to schedule the follow-up appointment.

## 2021-06-02 ENCOUNTER — Other Ambulatory Visit: Payer: Self-pay

## 2021-06-02 DIAGNOSIS — E782 Mixed hyperlipidemia: Secondary | ICD-10-CM

## 2021-06-02 DIAGNOSIS — I10 Essential (primary) hypertension: Secondary | ICD-10-CM

## 2021-06-02 DIAGNOSIS — I480 Paroxysmal atrial fibrillation: Secondary | ICD-10-CM

## 2021-06-02 MED ORDER — RIVAROXABAN 20 MG PO TABS: ORAL_TABLET | ORAL | 1 refills | Status: DC

## 2021-06-02 MED ORDER — ROSUVASTATIN CALCIUM 10 MG PO TABS: 10.0000 mg | ORAL_TABLET | Freq: Every day | ORAL | 3 refills | Status: DC

## 2021-06-02 NOTE — Telephone Encounter (Signed)
Xarelto 20 mg refill request received. Pt is 75 years old, weight- 99.2kg, Crea- 1.2 on 12/26/20 via [facility], last seen by CL on 05/23/21, Diagnosis-afib, CrCl- 63.44; Dose is appropriate based on dosing criteria. Will send in refill to requested pharmacy.

## 2021-07-02 DIAGNOSIS — Z6837 Body mass index (BMI) 37.0-37.9, adult: Secondary | ICD-10-CM | POA: Diagnosis not present

## 2021-07-02 DIAGNOSIS — M2550 Pain in unspecified joint: Secondary | ICD-10-CM | POA: Diagnosis not present

## 2021-07-02 DIAGNOSIS — Z79899 Other long term (current) drug therapy: Secondary | ICD-10-CM | POA: Diagnosis not present

## 2021-07-02 DIAGNOSIS — E669 Obesity, unspecified: Secondary | ICD-10-CM | POA: Diagnosis not present

## 2021-07-02 DIAGNOSIS — M1120 Other chondrocalcinosis, unspecified site: Secondary | ICD-10-CM | POA: Diagnosis not present

## 2021-07-02 DIAGNOSIS — M150 Primary generalized (osteo)arthritis: Secondary | ICD-10-CM | POA: Diagnosis not present

## 2021-07-08 ENCOUNTER — Other Ambulatory Visit: Payer: Self-pay

## 2021-07-08 DIAGNOSIS — I480 Paroxysmal atrial fibrillation: Secondary | ICD-10-CM

## 2021-07-08 DIAGNOSIS — E782 Mixed hyperlipidemia: Secondary | ICD-10-CM

## 2021-07-08 DIAGNOSIS — I10 Essential (primary) hypertension: Secondary | ICD-10-CM

## 2021-07-08 MED ORDER — HYDROCHLOROTHIAZIDE 25 MG PO TABS: 25.0000 mg | ORAL_TABLET | Freq: Every day | ORAL | 2 refills | Status: DC

## 2021-07-09 DIAGNOSIS — Z1231 Encounter for screening mammogram for malignant neoplasm of breast: Secondary | ICD-10-CM | POA: Diagnosis not present

## 2021-07-11 ENCOUNTER — Other Ambulatory Visit: Payer: Self-pay | Admitting: *Deleted

## 2021-07-11 MED ORDER — RIVAROXABAN 20 MG PO TABS: ORAL_TABLET | ORAL | 0 refills | Status: DC

## 2021-07-11 NOTE — Telephone Encounter (Signed)
Prescription refill request for Xarelto received.   Indication: afib, dvt, pe Last office visit: 05/23/2021, Elizabeth Wang Weight:99.2 kg  Age: 75 yo  Scr: 1.2, 12/26/2020 CrCl: 63 ml/min   See patient message from 9/16. Refill sent.

## 2021-07-25 ENCOUNTER — Other Ambulatory Visit: Payer: Self-pay

## 2021-07-25 ENCOUNTER — Ambulatory Visit: Payer: Medicare Other | Attending: Internal Medicine

## 2021-07-25 ENCOUNTER — Other Ambulatory Visit (HOSPITAL_BASED_OUTPATIENT_CLINIC_OR_DEPARTMENT_OTHER): Payer: Self-pay

## 2021-07-25 DIAGNOSIS — Z23 Encounter for immunization: Secondary | ICD-10-CM

## 2021-07-25 MED ORDER — PFIZER COVID-19 VAC BIVALENT 30 MCG/0.3ML IM SUSP
INTRAMUSCULAR | 0 refills | Status: AC
  Filled 2021-07-25: qty 0.3, 1d supply, fill #0

## 2021-07-25 NOTE — Progress Notes (Signed)
   Covid-19 Vaccination Clinic  Name:  Elizabeth Wang    MRN: [id number] DOB: [birth year]  07/25/2021  [name] was observed post Covid-19 immunization for 15 minutes without incident. She was provided with Vaccine Information Sheet and instruction to access the V-Safe system.   [name] was instructed to call 911 with any severe reactions post vaccine: Difficulty breathing  Swelling of face and throat  A fast heartbeat  A bad rash all over body  Dizziness and weakness

## 2021-08-12 ENCOUNTER — Ambulatory Visit: Payer: Medicare Other

## 2021-08-23 DIAGNOSIS — Z23 Encounter for immunization: Secondary | ICD-10-CM | POA: Diagnosis not present

## 2021-09-05 DIAGNOSIS — M25572 Pain in left ankle and joints of left foot: Secondary | ICD-10-CM | POA: Diagnosis not present

## 2021-09-05 DIAGNOSIS — M79672 Pain in left foot: Secondary | ICD-10-CM | POA: Diagnosis not present

## 2021-09-05 DIAGNOSIS — S92353A Displaced fracture of fifth metatarsal bone, unspecified foot, initial encounter for closed fracture: Secondary | ICD-10-CM | POA: Insufficient documentation

## 2021-09-05 DIAGNOSIS — S92355A Nondisplaced fracture of fifth metatarsal bone, left foot, initial encounter for closed fracture: Secondary | ICD-10-CM | POA: Diagnosis not present

## 2021-09-16 DIAGNOSIS — S92355A Nondisplaced fracture of fifth metatarsal bone, left foot, initial encounter for closed fracture: Secondary | ICD-10-CM | POA: Diagnosis not present

## 2021-10-14 DIAGNOSIS — L821 Other seborrheic keratosis: Secondary | ICD-10-CM | POA: Diagnosis not present

## 2021-10-14 DIAGNOSIS — D2239 Melanocytic nevi of other parts of face: Secondary | ICD-10-CM | POA: Diagnosis not present

## 2021-10-14 DIAGNOSIS — L84 Corns and callosities: Secondary | ICD-10-CM | POA: Diagnosis not present

## 2021-10-14 DIAGNOSIS — B078 Other viral warts: Secondary | ICD-10-CM | POA: Diagnosis not present

## 2021-10-24 DIAGNOSIS — R1032 Left lower quadrant pain: Secondary | ICD-10-CM | POA: Diagnosis not present

## 2021-10-24 DIAGNOSIS — R3129 Other microscopic hematuria: Secondary | ICD-10-CM | POA: Diagnosis not present

## 2021-10-24 DIAGNOSIS — R1012 Left upper quadrant pain: Secondary | ICD-10-CM | POA: Diagnosis not present

## 2021-10-24 DIAGNOSIS — K5792 Diverticulitis of intestine, part unspecified, without perforation or abscess without bleeding: Secondary | ICD-10-CM | POA: Diagnosis not present

## 2021-11-20 ENCOUNTER — Other Ambulatory Visit (HOSPITAL_BASED_OUTPATIENT_CLINIC_OR_DEPARTMENT_OTHER): Payer: Self-pay

## 2021-11-20 MED ORDER — ZOSTER VAC RECOMB ADJUVANTED 50 MCG/0.5ML IM SUSR
INTRAMUSCULAR | 0 refills | Status: AC
  Filled 2021-11-20: qty 0.5, 1d supply, fill #0

## 2021-11-28 ENCOUNTER — Other Ambulatory Visit: Payer: Self-pay | Admitting: Cardiology

## 2021-11-28 DIAGNOSIS — I4819 Other persistent atrial fibrillation: Secondary | ICD-10-CM

## 2021-11-28 NOTE — Telephone Encounter (Signed)
Xarelto 20mg  refill request received. Pt is 76 years old, weight-99.2kg, Crea-1.04 on 07/02/2021 via [employer], last seen by Dr. [name] on 05/23/2021, Diagnosis-Afib, CrCl-73.53ml/min; Dose is appropriate based on dosing criteria. Will send in refill to requested pharmacy.

## 2022-01-01 DIAGNOSIS — M1120 Other chondrocalcinosis, unspecified site: Secondary | ICD-10-CM | POA: Diagnosis not present

## 2022-01-01 DIAGNOSIS — M2550 Pain in unspecified joint: Secondary | ICD-10-CM | POA: Diagnosis not present

## 2022-01-01 DIAGNOSIS — E669 Obesity, unspecified: Secondary | ICD-10-CM | POA: Diagnosis not present

## 2022-01-01 DIAGNOSIS — M1991 Primary osteoarthritis, unspecified site: Secondary | ICD-10-CM | POA: Diagnosis not present

## 2022-01-01 DIAGNOSIS — Z6839 Body mass index (BMI) 39.0-39.9, adult: Secondary | ICD-10-CM | POA: Diagnosis not present

## 2022-01-01 DIAGNOSIS — Z79899 Other long term (current) drug therapy: Secondary | ICD-10-CM | POA: Diagnosis not present

## 2022-01-12 DIAGNOSIS — H2513 Age-related nuclear cataract, bilateral: Secondary | ICD-10-CM | POA: Diagnosis not present

## 2022-01-12 DIAGNOSIS — H43813 Vitreous degeneration, bilateral: Secondary | ICD-10-CM | POA: Diagnosis not present

## 2022-01-12 DIAGNOSIS — H5203 Hypermetropia, bilateral: Secondary | ICD-10-CM | POA: Diagnosis not present

## 2022-01-12 DIAGNOSIS — H52223 Regular astigmatism, bilateral: Secondary | ICD-10-CM | POA: Diagnosis not present

## 2022-01-12 DIAGNOSIS — H524 Presbyopia: Secondary | ICD-10-CM | POA: Diagnosis not present

## 2022-01-12 DIAGNOSIS — H43393 Other vitreous opacities, bilateral: Secondary | ICD-10-CM | POA: Diagnosis not present

## 2022-01-13 DIAGNOSIS — L280 Lichen simplex chronicus: Secondary | ICD-10-CM | POA: Diagnosis not present

## 2022-02-09 ENCOUNTER — Other Ambulatory Visit: Payer: Self-pay | Admitting: Cardiology

## 2022-02-16 NOTE — Progress Notes (Signed)
? ?PCP:  Ginger Organ., MD ?Primary Cardiologist: [name], MD ?Electrophysiologist: Vickie Epley, MD  ? ?Elizabeth Wang is a 76 y.o. female seen today for Vickie Epley, MD for routine electrophysiology followup.  Since last being seen in our clinic the patient reports doing very well. She has no awareness of being in AF.  she denies chest pain, palpitations, dyspnea, PND, orthopnea, nausea, vomiting, dizziness, syncope, edema, weight gain, or early satiety. ? ?Past Medical History:  ?Diagnosis Date  ? Acute pulmonary embolism ([place]) 06/2013  ? Arthritis   ? Asthma   ? years ago   ? Clotting disorder ([place])   ? DVT/PE  ? Diverticulosis   ? minor   ? Dyslipidemia   ? Esophageal reflux   ? Hypertension   ? Hypothyroidism   ? Numbness   ? TOES  ? Obesity   ? OSA (obstructive sleep apnea)   ? severe OSA with an AHI of 51/hr and nocturnal hypoxemia with O2 sats at low as 69%.  ? Paroxysmal atrial fibrillation ([place])   ? 1 isolated episode in the setting of acute stress with no further episodes.   ? Pseudogout   ? ?Past Surgical History:  ?Procedure Laterality Date  ? BREAST RECONSTRUCTION    ? CARDIOVERSION N/A 06/27/2020  ? Procedure: CARDIOVERSION;  Surgeon: [name], MD;  Location: [facility] ENDOSCOPY;  Service: Cardiovascular;  Laterality: N/A;  ? CESAREAN SECTION    ? COLONOSCOPY  2004  ? repeat in ten years  ? KNEE ARTHROSCOPY Left   ? TONSILLECTOMY    ? TOTAL KNEE ARTHROPLASTY Right 01/01/2014  ? Procedure: TOTAL RIGHT KNEE ARTHROPLASTY;  Surgeon: [name], MD;  Location: [facility] ORS;  Service: Orthopedics;  Laterality: Right;  ? TOTAL KNEE ARTHROPLASTY Left 07/23/2014  ? Procedure: LEFT TOTAL KNEE ARTHROPLASTY;  Surgeon: [name], MD;  Location: [facility] ORS;  Service: Orthopedics;  Laterality: Left;  ? tummy tuck  1987  ? WISDOM TOOTH EXTRACTION    ? ? ?Current Outpatient Medications  ?Medication Sig Dispense Refill  ? acetaminophen (TYLENOL) 500 MG tablet Take 1,000 mg by mouth every 8 (eight)  hours as needed for moderate pain or headache.    ? ASPERCREME LIDOCAINE EX Apply 1 application topically daily as needed (arthritis pain).    ? cetirizine (ZYRTEC) 10 MG tablet Take 10 mg by mouth daily as needed for allergies.     ? colchicine 0.6 MG tablet Take 0.6 mg by mouth at bedtime. For gout    ? COVID-19 mRNA bivalent vaccine, Pfizer, (PFIZER COVID-19 VAC BIVALENT) injection Inject into the muscle. 0.3 mL 0  ? COVID-19 mRNA vaccine, Moderna, 100 MCG/0.5ML injection Inject into the muscle. 0.25 mL 0  ? diltiazem (CARDIZEM CD) 360 MG 24 hr capsule Take 1 capsule (360 mg total) by mouth daily. 90 capsule 3  ? EPINEPHrine 0.3 mg/0.3 mL IJ SOAJ injection Inject 0.3 mg into the muscle as needed for anaphylaxis (Allergic Reaction).     ? fluticasone (FLONASE) 50 MCG/ACT nasal spray Place 1-2 sprays into both nostrils daily as needed for allergies.     ? gabapentin (NEURONTIN) 300 MG capsule Take 300 mg by mouth in the morning, at noon, and at bedtime.    ? hydrochlorothiazide (HYDRODIURIL) 25 MG tablet Take 1 tablet (25 mg total) by mouth daily. Please keep upcoming appt in July 2022 with Dr. Johney Frame before anymore refills. Thank you 90 tablet 2  ? levothyroxine (SYNTHROID, LEVOTHROID) 125  MCG tablet Take 125 mcg by mouth daily before breakfast. For thyroid therapy    ? ondansetron (ZOFRAN-ODT) 4 MG disintegrating tablet Take 4 mg by mouth 3 (three) times daily as needed (nausea/vomiting). For N&V    ? rivaroxaban (XARELTO) 20 MG TABS tablet TAKE ONE TABLET DAILY WITH SUPPER 90 tablet 1  ? rosuvastatin (CRESTOR) 10 MG tablet Take 1 tablet (10 mg total) by mouth daily. 90 tablet 3  ? telmisartan (MICARDIS) 80 MG tablet Take 80 mg by mouth daily.    ? valACYclovir (VALTREX) 1000 MG tablet Take 1,000 mg by mouth daily as needed (outbreaks).     ? Zoster Vaccine Adjuvanted [facility]) injection Inject into the muscle. 0.5 mL 0  ? ?No current facility-administered medications for this visit.  ? ? ?Allergies   ?Allergen Reactions  ? Nsaids Other (See Comments)  ?  significantly raises BP  ? Bee Venom Swelling  ? Other   ?  Artificial sweeteners cause dizziness and altered mental state  ? Codeine Nausea Only  ? ? ?Social History  ? ?Socioeconomic History  ? Marital status: Married  ?  Spouse name: Not on file  ? Number of children: Not on file  ? Years of education: Not on file  ? Highest education level: Not on file  ?Occupational History  ? Occupation: Music therapist  ?Tobacco Use  ? Smoking status: Former  ?  Packs/day: 0.25  ?  Years: 12.00  ?  Pack years: 3.00  ?  Types: Cigarettes  ?  Quit date: 12/28/1975  ?  Years since quitting: 46.1  ? Smokeless tobacco: Never  ?Substance and Sexual Activity  ? Alcohol use: Yes  ?  Alcohol/week: 4.0 standard drinks  ?  Types: 4 Glasses of wine per week  ?  Comment: socially  ? Drug use: No  ? Sexual activity: Not on file  ?Other Topics Concern  ? Not on file  ?Social History Narrative  ? Lives in [place] with her husband. Exercises at the gym 4 times a week.  ? ?Social Determinants of Health  ? ?Financial Resource Strain: Not on file  ?Food Insecurity: Not on file  ?Transportation Needs: Not on file  ?Physical Activity: Not on file  ?Stress: Not on file  ?Social Connections: Not on file  ?Intimate Partner Violence: Not on file  ? ? ? ?Review of Systems: ?All other systems reviewed and are otherwise negative except as noted above. ? ?Physical Exam: ?Vitals:  ? 02/19/22 1203  ?BP: 112/72  ?Pulse: 67  ?SpO2: 98%  ?Weight: 231 lb (104.8 kg)  ?Height: 5' 3.5" (1.613 m)  ? ? ?GEN- The patient is well appearing, alert and oriented x 3 today.   ?HEENT: normocephalic, atraumatic; sclera clear, conjunctiva pink; hearing intact; oropharynx clear; neck supple, no JVP ?Lymph- no cervical lymphadenopathy ?Lungs- Clear to ausculation bilaterally, normal work of breathing.  No wheezes, rales, rhonchi ?Heart- Regular rate and rhythm, no murmurs, rubs or gallops, PMI not laterally  displaced ?GI- soft, non-tender, non-distended, bowel sounds present, no hepatosplenomegaly ?Extremities- no clubbing, cyanosis, or edema; DP/PT/radial pulses 2+ bilaterally ?MS- no significant deformity or atrophy ?Skin- warm and dry, no rash or lesion ?Psych- euthymic mood, full affect ?Neuro- strength and sensation are intact ? ?EKG is ordered. Personal review of EKG from today shows AF at 67 bpm ? ?Additional studies reviewed include: ?Previous EP office notes.  ? ?Assessment and Plan: ? ?1. Persistent atrial fibrillation ([place]) ?Asymptomatic with rate control.  ?EKG today  shows rate controlled AF ?Continue diltiazem and Xarelto.  ?Prefers to avoid ablation. Would need to demonstrate strict adherance to CPAP prior to ablation.  ?Labs today.  ?She is wearing BiPAP nightly and reports grade of 97-98% on her app.  ?Labs today.  ?  ?2. HTN ?Stable on current regimen  ?  ?3. OSA (obstructive sleep apnea) ?Encouraged nightly CPAP.  ? ?Follow up with Dr. [name] in  as needed.    Continue regular follow up with Dr. Johney Frame.  ? ?Shirley Friar, PA-C  ?02/19/22 ?12:19 PM  ?

## 2022-02-19 ENCOUNTER — Ambulatory Visit (INDEPENDENT_AMBULATORY_CARE_PROVIDER_SITE_OTHER): Payer: Medicare Other | Admitting: Student

## 2022-02-19 ENCOUNTER — Encounter: Payer: Self-pay | Admitting: Student

## 2022-02-19 VITALS — BP 112/72 | HR 67 | Ht 63.5 in | Wt 231.0 lb

## 2022-02-19 DIAGNOSIS — G4733 Obstructive sleep apnea (adult) (pediatric): Secondary | ICD-10-CM | POA: Diagnosis not present

## 2022-02-19 DIAGNOSIS — I10 Essential (primary) hypertension: Secondary | ICD-10-CM | POA: Diagnosis not present

## 2022-02-19 DIAGNOSIS — I4819 Other persistent atrial fibrillation: Secondary | ICD-10-CM | POA: Diagnosis not present

## 2022-02-19 NOTE — Patient Instructions (Signed)
Medication Instructions:  ?Your physician recommends that you continue on your current medications as directed. Please refer to the Current Medication list given to you today. ? ?*If you need a refill on your cardiac medications before your next appointment, please call your pharmacy* ? ? ?Lab Work: ?TODAY: BMET, CBC ? ?If you have labs (blood work) drawn today and your tests are completely normal, you will receive your results only by: ?MyChart Message (if you have MyChart) OR ?A paper copy in the mail ?If you have any lab test that is abnormal or we need to change your treatment, we will call you to review the results. ? ? ?Follow-Up: ?At [facility], you and your health needs are our priority.  As part of our continuing mission to provide you with exceptional heart care, we have created designated Provider Care Teams.  These Care Teams include your primary Cardiologist (physician) and Advanced Practice Providers (APPs -  Physician Assistants and Nurse Practitioners) who all work together to provide you with the care you need, when you need it. ? ? ?Your next appointment:   ?As needed ?

## 2022-02-20 LAB — BASIC METABOLIC PANEL
BUN/Creatinine Ratio: 20 (ref 12–28)
BUN: 20 mg/dL (ref 8–27)
CO2: 21 mmol/L (ref 20–29)
Calcium: 9.4 mg/dL (ref 8.7–10.3)
Chloride: 97 mmol/L (ref 96–106)
Creatinine, Ser: 0.98 mg/dL (ref 0.57–1.00)
Glucose: 101 mg/dL — ABNORMAL HIGH (ref 70–99)
Potassium: 4 mmol/L (ref 3.5–5.2)
Sodium: 137 mmol/L (ref 134–144)
eGFR: 60 mL/min/{1.73_m2} (ref >59)

## 2022-02-20 LAB — CBC
Hematocrit: 38.6 % (ref 34.0–46.6)
Hemoglobin: 13.1 g/dL (ref 11.1–15.9)
MCH: 32.7 pg (ref 26.6–33.0)
MCHC: 33.9 g/dL (ref 31.5–35.7)
MCV: 96 fL (ref 79–97)
Platelets: 190 x10E3/uL (ref 150–450)
RBC: 4.01 x10E6/uL (ref 3.77–5.28)
RDW: 12.8 % (ref 11.7–15.4)
WBC: 7.2 x10E3/uL (ref 3.4–10.8)

## 2022-02-26 NOTE — Progress Notes (Deleted)
?Cardiology Office Note:   ? ?Date:  02/26/2022  ? ?ID:  Everlynn Sagun, DOB 1946-06-15, MRN [id number] ? ?PCP:  Ginger Organ., MD ?  ?[facility] Providers ?Cardiologist:  [name], MD ?Electrophysiologist:  Vickie Epley, MD  ?Sleep Medicine:  Fransico Him, MD  ?{ ? ?Referring MD: Ginger Organ., MD  ? ? ?History of Present Illness:   ? ?Neria Procter is a 76 y.o. female with a hx of paroxysmal Afib, HTN, right leg DVT and PE 06/2013 who was previously followed by Dr. Meda Coffee who now presents to clinic for follow-up. ? ?Per review of the record, patient has history of isolated Afib in 06/2013 but recurred after receiving COVID vaccine and she was placed on xarelto. Has been followed by Dr. [name] and currently pursuing a rate control strategy at this time. Dilt recently increased. If fails rate control and has persistent symptoms, may proceed with ablation. Recent TTE 06/2020 with normal LVEF 60%, moderate RA and LA dilation.  ? ?Was seen in clinic by Dr. [name] on 04/2021 where she was doing well with rate control strategy. Was also recommended to ensure her BiPAP device was working appropriately. Had follow-up with Oda Kilts on 01/2022 where she as doing well.  ? ?Today, *** ? ? ?Past Medical History:  ?Diagnosis Date  ? Acute pulmonary embolism ([place]) 06/2013  ? Arthritis   ? Asthma   ? years ago   ? Clotting disorder ([place])   ? DVT/PE  ? Diverticulosis   ? minor   ? Dyslipidemia   ? Esophageal reflux   ? Hypertension   ? Hypothyroidism   ? Numbness   ? TOES  ? Obesity   ? OSA (obstructive sleep apnea)   ? severe OSA with an AHI of 51/hr and nocturnal hypoxemia with O2 sats at low as 69%.  ? Paroxysmal atrial fibrillation ([place])   ? 1 isolated episode in the setting of acute stress with no further episodes.   ? Pseudogout   ? ? ?Past Surgical History:  ?Procedure Laterality Date  ? BREAST RECONSTRUCTION    ? CARDIOVERSION N/A 06/27/2020  ? Procedure: CARDIOVERSION;  Surgeon: [name], MD;   Location: [facility] ENDOSCOPY;  Service: Cardiovascular;  Laterality: N/A;  ? CESAREAN SECTION    ? COLONOSCOPY  2004  ? repeat in ten years  ? KNEE ARTHROSCOPY Left   ? TONSILLECTOMY    ? TOTAL KNEE ARTHROPLASTY Right 01/01/2014  ? Procedure: TOTAL RIGHT KNEE ARTHROPLASTY;  Surgeon: [name], MD;  Location: [facility] ORS;  Service: Orthopedics;  Laterality: Right;  ? TOTAL KNEE ARTHROPLASTY Left 07/23/2014  ? Procedure: LEFT TOTAL KNEE ARTHROPLASTY;  Surgeon: [name], MD;  Location: [facility] ORS;  Service: Orthopedics;  Laterality: Left;  ? tummy tuck  1987  ? WISDOM TOOTH EXTRACTION    ? ? ?Current Medications: ?No outpatient medications have been marked as taking for the 03/02/22 encounter (Appointment) with Freada Bergeron, MD.  ?  ? ?Allergies:   Nsaids, Bee venom, Other, and Codeine  ? ?Social History  ? ?Socioeconomic History  ? Marital status: Married  ?  Spouse name: Not on file  ? Number of children: Not on file  ? Years of education: Not on file  ? Highest education level: Not on file  ?Occupational History  ? Occupation: Music therapist  ?Tobacco Use  ? Smoking status: Former  ?  Packs/day: 0.25  ?  Years: 12.00  ?  Pack  years: 3.00  ?  Types: Cigarettes  ?  Quit date: 12/28/1975  ?  Years since quitting: 46.1  ? Smokeless tobacco: Never  ?Substance and Sexual Activity  ? Alcohol use: Yes  ?  Alcohol/week: 4.0 standard drinks  ?  Types: 4 Glasses of wine per week  ?  Comment: socially  ? Drug use: No  ? Sexual activity: Not on file  ?Other Topics Concern  ? Not on file  ?Social History Narrative  ? Lives in [place] with her husband. Exercises at the gym 4 times a week.  ? ?Social Determinants of Health  ? ?Financial Resource Strain: Not on file  ?Food Insecurity: Not on file  ?Transportation Needs: Not on file  ?Physical Activity: Not on file  ?Stress: Not on file  ?Social Connections: Not on file  ?  ? ?Family History: ?The patient's family history includes CVA in her mother; Coronary artery disease in  her father; Heart disease in her father; Hypertension in her father. ? ?ROS:   ?Review of Systems  ?Constitutional:  Positive for malaise/fatigue. Negative for fever.  ?HENT:  Positive for congestion. Negative for nosebleeds and sore throat.   ?Eyes:  Negative for pain and discharge.  ?Respiratory:  Positive for cough. Negative for sputum production and wheezing.   ?Cardiovascular:  Negative for chest pain, palpitations, orthopnea, claudication, leg swelling and PND.  ?Gastrointestinal:  Negative for blood in stool and diarrhea.  ?Genitourinary:  Negative for hematuria and urgency.  ?Musculoskeletal:  Negative for falls and joint pain.  ?Neurological:  Negative for tremors and headaches.  ?Endo/Heme/Allergies:  Negative for environmental allergies.  ?Psychiatric/Behavioral:  Negative for depression and memory loss.   ? ? ?EKGs/Labs/Other Studies Reviewed:   ? ?The following studies were reviewed today: ? ?TTE: 07/19/2020 ? 1. Left ventricular ejection fraction, by estimation, is 60 to 65%. The  ?left ventricle has normal function. The left ventricle has no regional  ?wall motion abnormalities. There is mild left ventricular hypertrophy.  ?Left ventricular diastolic parameters  ?are indeterminate.  ? 2. Right ventricular systolic function is normal. The right ventricular  ?size is normal.  ? 3. Left atrial size was moderately dilated.  ? 4. Right atrial size was moderately dilated.  ? 5. The mitral valve is normal in structure. No evidence of mitral valve  ?regurgitation. No evidence of mitral stenosis.  ? 6. The aortic valve is tricuspid. Aortic valve regurgitation is not  ?visualized. Mild aortic valve stenosis. Aortic valve mean gradient  ?measures 13.0 mmHg.  ? 7. Aortic dilatation noted. 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  ? 8. The inferior vena cava is normal in size with greater than 50%  ?respiratory variability, suggesting right atrial pressure of 3 mmHg.  ? 9. The patient is  in atrial fibrillation.  ? ?EKG:   ?05/06/2021: EKG is not ordered today. ? ? ?Recent Labs: ?02/19/2022: BUN 20; Creatinine, Ser 0.98; Hemoglobin 13.1; Platelets 190; Potassium 4.0; Sodium 137  ?Recent Lipid Panel ?   ?Component Value Date/Time  ? CHOL 138 12/01/2019 1529  ? TRIG 168 (H) 12/01/2019 1529  ? HDL 46 12/01/2019 1529  ? CHOLHDL 3.0 12/01/2019 1529  ? [place] 64 12/01/2019 1529  ? ? ? ?Risk Assessment/Calculations:   ? ?CHA2DS2-VASc Score = 4  ?This indicates a 4.8% annual risk of stroke. ?The patient's score is based upon: ?CHF History: 0 ?HTN History: 1 ?Diabetes History: 0 ?Stroke History: 0 ?Vascular Disease History: 0 ?Age  Score: 2 ?Gender Score: 1 ?  ? ?    ? ?Physical Exam:   ? ?VS:  LMP  (LMP Unknown)    ? ?Wt Readings from Last 3 Encounters:  ?02/19/22 231 lb (104.8 kg)  ?05/23/21 218 lb 12.8 oz (99.2 kg)  ?05/06/21 219 lb (99.3 kg)  ?  ? ?GEN: Well nourished, well developed in no acute distress ?HEENT: Normal ?NECK: No JVD; No carotid bruits ?CARDIAC: Irregularly irregular no murmurs, rubs, gallops ?RESPIRATORY:  Clear to auscultation without rales, wheezing or rhonchi  ?ABDOMEN: Soft, non-tender, non-distended ?MUSCULOSKELETAL:  No edema; No deformity  ?SKIN: Warm and dry ?NEUROLOGIC:  Alert and oriented x 3 ?PSYCHIATRIC:  Normal affect  ? ?ASSESSMENT:   ? ?No diagnosis found. ? ?PLAN:   ? ?In order of problems listed above: ? ?#Persistent Afib: ?CHADs-vasc 4. On xarelto for [facility]. Followed by Dr. [name]. Plan for rate control strategy for now with consideration of ablation if symptoms worsen. TTE with normal LVEF, moderately dilated RA/LA.  ?-Continue dilt '360mg'$  daily ?-Continue xarelto '20mg'$  daily ?-Follow-up with Dr. [name] as scheduled ? ?#HTN: ?Blood pressure well controlled <120/80s. No orthostatic symptoms.  ?-Continue telmisartan '80mg'$  daily ?-Continue HCTZ '25mg'$  daily ?-Continue dilt '360mg'$  daily ? ?#HLD: ?-Continue crestor '10mg'$  daily ?-Lipids at goal with goal LDL 63  ?-Lipids followed by  PCP ? ?#OSA: ?-Continue BiPAP ? ?#Obesity: BMI 38 ?Working hard on weight loss. Currently on [place] diet plan. Hoping to resume swimming and water aerobics once her cold clears up. ?-Continue Noom diet ?-Resume

## 2022-02-27 ENCOUNTER — Other Ambulatory Visit (HOSPITAL_BASED_OUTPATIENT_CLINIC_OR_DEPARTMENT_OTHER): Payer: Self-pay

## 2022-03-02 ENCOUNTER — Encounter: Payer: Self-pay | Admitting: Cardiology

## 2022-03-02 ENCOUNTER — Ambulatory Visit (INDEPENDENT_AMBULATORY_CARE_PROVIDER_SITE_OTHER): Payer: Medicare Other | Admitting: Cardiology

## 2022-03-02 VITALS — BP 112/66 | HR 81 | Ht 63.5 in | Wt 228.8 lb

## 2022-03-02 DIAGNOSIS — I4819 Other persistent atrial fibrillation: Secondary | ICD-10-CM | POA: Diagnosis not present

## 2022-03-02 DIAGNOSIS — I10 Essential (primary) hypertension: Secondary | ICD-10-CM

## 2022-03-02 DIAGNOSIS — D6869 Other thrombophilia: Secondary | ICD-10-CM | POA: Diagnosis not present

## 2022-03-02 DIAGNOSIS — E782 Mixed hyperlipidemia: Secondary | ICD-10-CM

## 2022-03-02 DIAGNOSIS — E669 Obesity, unspecified: Secondary | ICD-10-CM | POA: Diagnosis not present

## 2022-03-02 DIAGNOSIS — G4733 Obstructive sleep apnea (adult) (pediatric): Secondary | ICD-10-CM

## 2022-03-02 MED ORDER — ROSUVASTATIN CALCIUM 10 MG PO TABS: 10.0000 mg | ORAL_TABLET | Freq: Every day | ORAL | 3 refills | Status: DC

## 2022-03-02 NOTE — Progress Notes (Signed)
?Cardiology Office Note:   ? ?Date:  03/02/2022  ? ?ID:  Elizabeth Wang, DOB 15-Mar-1946, MRN [id number] ? ?PCP:  Ginger Organ., MD ?  ?[facility] Providers ?Cardiologist:  [name], MD ?Electrophysiologist:  Vickie Epley, MD  ?Sleep Medicine:  Fransico Him, MD  ?{ ? ?Referring MD: Ginger Organ., MD  ? ? ?History of Present Illness:   ? ?Elizabeth Wang is a 76 y.o. female with a hx of paroxysmal Afib, HTN, right leg DVT and PE 06/2013 who was previously followed by Dr. Meda Coffee who now presents to clinic for follow-up. ? ?Per review of the record, patient has history of isolated Afib in 06/2013 but recurred after receiving COVID vaccine and she was placed on xarelto. Has been followed by Dr. [name] and currently pursuing a rate control strategy at this time. Dilt recently increased. If fails rate control and has persistent symptoms, may proceed with ablation. Recent TTE 06/2020 with normal LVEF 60%, moderate RA and LA dilation.  ? ?Was seen in clinic by Dr. [name] on 04/2021 where she was doing well with rate control strategy. Was also recommended to ensure her BiPAP device was working appropriately. Had follow-up with Oda Kilts on 01/2022 where she was doing well.  ? ?Today, the patient states that she is feeling well. Her Afib is reportedly stable since her last appointment. If it wasn't for her smart watch alerts she would be unaware of her arrhythmic episodes. ? ?Lately her left ankle has been more swollen than usual. Also, she has a lesion on the bottom of her left great toe which she attributes to a mechanical injury. ? ?She continues to struggle with weight loss. For exercise she was regularly using the pool. A few months ago she developed a flare-up of sciatica which has significantly limited her formal exercise since then. ? ?She is now using a different kind of BiPAP mask, however she still sometimes wakes up and needs to take the mask off. On nights when she feels very exhausted she will  usually be able to sleep with the mask through the night. ? ?She remains compliant with Xarelto. She has alarms set to prevent missed doses. No significant bleeding issues. ? ?She denies any palpitations, chest pain, shortness of breath. No lightheadedness, headaches, syncope, orthopnea, or PND. ? ? ?Past Medical History:  ?Diagnosis Date  ? Acute pulmonary embolism ([place]) 06/2013  ? Arthritis   ? Asthma   ? years ago   ? Clotting disorder ([place])   ? DVT/PE  ? Diverticulosis   ? minor   ? Dyslipidemia   ? Esophageal reflux   ? Hypertension   ? Hypothyroidism   ? Numbness   ? TOES  ? Obesity   ? OSA (obstructive sleep apnea)   ? severe OSA with an AHI of 51/hr and nocturnal hypoxemia with O2 sats at low as 69%.  ? Paroxysmal atrial fibrillation ([place])   ? 1 isolated episode in the setting of acute stress with no further episodes.   ? Pseudogout   ? ? ?Past Surgical History:  ?Procedure Laterality Date  ? BREAST RECONSTRUCTION    ? CARDIOVERSION N/A 06/27/2020  ? Procedure: CARDIOVERSION;  Surgeon: [name], MD;  Location: [facility] ENDOSCOPY;  Service: Cardiovascular;  Laterality: N/A;  ? CESAREAN SECTION    ? COLONOSCOPY  2004  ? repeat in ten years  ? KNEE ARTHROSCOPY Left   ? TONSILLECTOMY    ? TOTAL KNEE ARTHROPLASTY Right 01/01/2014  ?  Procedure: TOTAL RIGHT KNEE ARTHROPLASTY;  Surgeon: [name], MD;  Location: [facility] ORS;  Service: Orthopedics;  Laterality: Right;  ? TOTAL KNEE ARTHROPLASTY Left 07/23/2014  ? Procedure: LEFT TOTAL KNEE ARTHROPLASTY;  Surgeon: [name], MD;  Location: [facility] ORS;  Service: Orthopedics;  Laterality: Left;  ? tummy tuck  1987  ? WISDOM TOOTH EXTRACTION    ? ? ?Current Medications: ?Current Meds  ?Medication Sig  ? acetaminophen (TYLENOL) 500 MG tablet Take 1,000 mg by mouth every 8 (eight) hours as needed for moderate pain or headache.  ? ASPERCREME LIDOCAINE EX Apply 1 application topically daily as needed (arthritis pain).  ? cetirizine (ZYRTEC) 10 MG tablet Take 10 mg by mouth  daily as needed for allergies.   ? colchicine 0.6 MG tablet Take 0.6 mg by mouth at bedtime. For gout  ? COVID-19 mRNA bivalent vaccine, Pfizer, (PFIZER COVID-19 VAC BIVALENT) injection Inject into the muscle.  ? COVID-19 mRNA vaccine, Moderna, 100 MCG/0.5ML injection Inject into the muscle.  ? diltiazem (CARDIZEM CD) 360 MG 24 hr capsule Take 1 capsule (360 mg total) by mouth daily.  ? EPINEPHrine 0.3 mg/0.3 mL IJ SOAJ injection Inject 0.3 mg into the muscle as needed for anaphylaxis (Allergic Reaction).   ? fluticasone (FLONASE) 50 MCG/ACT nasal spray Place 1-2 sprays into both nostrils daily as needed for allergies.   ? gabapentin (NEURONTIN) 300 MG capsule Take 300 mg by mouth in the morning, at noon, and at bedtime.  ? hydrochlorothiazide (HYDRODIURIL) 25 MG tablet Take 1 tablet (25 mg total) by mouth daily. Please keep upcoming appt in July 2022 with Dr. Johney Frame before anymore refills. Thank you  ? levothyroxine (SYNTHROID, LEVOTHROID) 125 MCG tablet Take 125 mcg by mouth daily before breakfast. For thyroid therapy  ? ondansetron (ZOFRAN-ODT) 4 MG disintegrating tablet Take 4 mg by mouth 3 (three) times daily as needed (nausea/vomiting). For N&V  ? rivaroxaban (XARELTO) 20 MG TABS tablet TAKE ONE TABLET DAILY WITH SUPPER  ? telmisartan (MICARDIS) 80 MG tablet Take 80 mg by mouth daily.  ? valACYclovir (VALTREX) 1000 MG tablet Take 1,000 mg by mouth daily as needed (outbreaks).   ? Zoster Vaccine Adjuvanted [facility]) injection Inject into the muscle.  ? [DISCONTINUED] rosuvastatin (CRESTOR) 10 MG tablet Take 1 tablet (10 mg total) by mouth daily.  ?  ? ?Allergies:   Nsaids, Bee venom, Other, and Codeine  ? ?Social History  ? ?Socioeconomic History  ? Marital status: Married  ?  Spouse name: Not on file  ? Number of children: Not on file  ? Years of education: Not on file  ? Highest education level: Not on file  ?Occupational History  ? Occupation: Music therapist  ?Tobacco Use  ? Smoking status: Former  ?   Packs/day: 0.25  ?  Years: 12.00  ?  Pack years: 3.00  ?  Types: Cigarettes  ?  Quit date: 12/28/1975  ?  Years since quitting: 46.2  ? Smokeless tobacco: Never  ?Substance and Sexual Activity  ? Alcohol use: Yes  ?  Alcohol/week: 4.0 standard drinks  ?  Types: 4 Glasses of wine per week  ?  Comment: socially  ? Drug use: No  ? Sexual activity: Not on file  ?Other Topics Concern  ? Not on file  ?Social History Narrative  ? Lives in [place] with her husband. Exercises at the gym 4 times a week.  ? ?Social Determinants of Health  ? ?Financial Resource Strain: Not on file  ?  Food Insecurity: Not on file  ?Transportation Needs: Not on file  ?Physical Activity: Not on file  ?Stress: Not on file  ?Social Connections: Not on file  ?  ? ?Family History: ?The patient's family history includes CVA in her mother; Coronary artery disease in her father; Heart disease in her father; Hypertension in her father. ? ?ROS:   ?Review of Systems  ?Constitutional:  Positive for malaise/fatigue. Negative for fever.  ?HENT:  Negative for congestion, nosebleeds and sore throat.   ?Eyes:  Negative for pain and discharge.  ?Respiratory:  Negative for cough, sputum production and wheezing.   ?Cardiovascular:  Positive for leg swelling. Negative for chest pain, palpitations, orthopnea, claudication and PND.  ?Gastrointestinal:  Negative for blood in stool and diarrhea.  ?Genitourinary:  Negative for hematuria and urgency.  ?Musculoskeletal:  Positive for joint pain and myalgias. Negative for falls.  ?Neurological:  Negative for tremors and headaches.  ?Endo/Heme/Allergies:  Negative for environmental allergies.  ?Psychiatric/Behavioral:  Negative for depression and memory loss.   ? ? ?EKGs/Labs/Other Studies Reviewed:   ? ?The following studies were reviewed today: ? ?TTE: 07/19/2020 ? 1. Left ventricular ejection fraction, by estimation, is 60 to 65%. The  ?left ventricle has normal function. The left ventricle has no regional  ?wall motion  abnormalities. There is mild left ventricular hypertrophy.  ?Left ventricular diastolic parameters  ?are indeterminate.  ? 2. Right ventricular systolic function is normal. The right ventricular  ?size is n

## 2022-03-02 NOTE — Patient Instructions (Signed)
Medication Instructions:  ?Your physician recommends that you continue on your current medications as directed. Please refer to the Current Medication list given to you today. ? ?*If you need a refill on your cardiac medications before your next appointment, please call your pharmacy* ? ? ?Lab Work: ?none ?If you have labs (blood work) drawn today and your tests are completely normal, you will receive your results only by: ?MyChart Message (if you have MyChart) OR ?A paper copy in the mail ?If you have any lab test that is abnormal or we need to change your treatment, we will call you to review the results. ? ? ?Testing/Procedures: ?none ? ? ?Follow-Up: ?At [facility], you and your health needs are our priority.  As part of our continuing mission to provide you with exceptional heart care, we have created designated Provider Care Teams.  These Care Teams include your primary Cardiologist (physician) and Advanced Practice Providers (APPs -  Physician Assistants and Nurse Practitioners) who all work together to provide you with the care you need, when you need it. ? ?We recommend signing up for the patient portal called "MyChart".  Sign up information is provided on this After Visit Summary.  MyChart is used to connect with patients for Virtual Visits (Telemedicine).  Patients are able to view lab/test results, encounter notes, upcoming appointments, etc.  Non-urgent messages can be sent to your provider as well.   ?To learn more about what you can do with MyChart, go to [email or web address].   ? ?Your next appointment:   ?6 month(s) ? ?The format for your next appointment:   ?In Person ? ?Dr Gwyndolyn Kaufman ? ?If primary card or EP is not listed click here to update    :1}  ? ? ?Other Instructions ? ? ?Important Information About Sugar ? ? ? ? ?  ?

## 2022-03-03 ENCOUNTER — Telehealth: Payer: Self-pay | Admitting: Pharmacist Clinician (PhC)/ Clinical Pharmacy Specialist

## 2022-03-03 NOTE — Telephone Encounter (Addendum)
Called patient about GLP referral.  Patient has no history of DM.  Medicare won't cover Wegovy/weight loss medications.  Will only cover Ozempic for diagnoses of DM.   Explained information to patient, answered all questions.   ?

## 2022-03-03 NOTE — Telephone Encounter (Signed)
-----   Message from Freada Bergeron, MD sent at 03/02/2022 11:31 AM EDT ----- ?What about GLPR-1 agonist for her? Will insurance cover? ? ?Thank you! ? ?-Nira Conn ? ?

## 2022-03-11 ENCOUNTER — Ambulatory Visit (INDEPENDENT_AMBULATORY_CARE_PROVIDER_SITE_OTHER): Payer: Medicare Other | Admitting: Podiatry

## 2022-03-11 ENCOUNTER — Encounter: Payer: Self-pay | Admitting: Podiatry

## 2022-03-11 DIAGNOSIS — L84 Corns and callosities: Secondary | ICD-10-CM | POA: Diagnosis not present

## 2022-03-11 DIAGNOSIS — M2042 Other hammer toe(s) (acquired), left foot: Secondary | ICD-10-CM | POA: Diagnosis not present

## 2022-03-11 DIAGNOSIS — L97521 Non-pressure chronic ulcer of other part of left foot limited to breakdown of skin: Secondary | ICD-10-CM | POA: Diagnosis not present

## 2022-03-11 DIAGNOSIS — M2041 Other hammer toe(s) (acquired), right foot: Secondary | ICD-10-CM

## 2022-03-11 NOTE — Progress Notes (Signed)
?  Subjective:  ?Patient ID: Elizabeth Wang, female    DOB: 01/20/1946,   MRN: [id number] ? ?No chief complaint on file. ? ? ?76 y.o. female presents for concern of left hallux ulcer that has been occurring off and on for about 5 years. Relates throbbing on the toe and some drainage. Generally does not keep a dressing on. Also relates a corn on the right third digit that is painful and has been using salicylic acid on the area and wanted evaluated.  . Denies any other pedal complaints. Denies n/v/f/c.  ? ?Past Medical History:  ?Diagnosis Date  ? Acute pulmonary embolism ([place]) 06/2013  ? Arthritis   ? Asthma   ? years ago   ? Clotting disorder ([place])   ? DVT/PE  ? Diverticulosis   ? minor   ? Dyslipidemia   ? Esophageal reflux   ? Hypertension   ? Hypothyroidism   ? Numbness   ? TOES  ? Obesity   ? OSA (obstructive sleep apnea)   ? severe OSA with an AHI of 51/hr and nocturnal hypoxemia with O2 sats at low as 69%.  ? Paroxysmal atrial fibrillation ([place])   ? 1 isolated episode in the setting of acute stress with no further episodes.   ? Pseudogout   ? ? ?Objective:  ?Physical Exam: ?Vascular: DP/PT pulses 2/4 bilateral. CFT <3 seconds. Normal hair growth on digits. No edema.  ?Skin. No lacerations or abrasions bilateral feet. Hyperkeratotic lesion noted to lateral third digit around nail plate and some incurvation of right third digit nail. Left hallux with superficial granular ulcer. Measures about 0.2 x 0.2 x 0.1 cm. No erythema edema or purulence noted.  ?Musculoskeletal: MMT 5/5 bilateral lower extremities in DF, PF, Inversion and Eversion. Deceased ROM in DF of ankle joint.  ?Neurological: Sensation intact to light touch.  ? ?Assessment:  ? ?1. Skin ulcer of left great toe, limited to breakdown of skin ([place])   ?2. Corns and callosities   ?3. Hammertoe, bilateral   ? ? ? ?Plan:  ?Patient was evaluated and treated and all questions answered. ?Ulcer left hallux wound limited to breakdown of skin  ?-Debridement as  below. ?-Dressed with betadine, DSD. ?-Off-loading with toe cap ?-No abx indicated.  ?-Right third digit debrided as courtesy and toe cap provided.  ?-Discussed glucose control and proper protein-rich diet.  ?-Discussed if any worsening redness, pain, fever or chills to call or may need to report to the emergency room. Patient expressed understanding.  ? ?Procedure: Excisional Debridement of Wound ?Rationale: Removal of non-viable soft tissue from the wound to promote healing.  ?Anesthesia: none ?Pre-Debridement Wound Measurements: overlying callus  ?Post-Debridement Wound Measurements: 0.2 cm x 0.2 cm x 0.1 cm  ?Type of Debridement: Sharp Excisional ?Tissue Removed: Non-viable soft tissue ?Depth of Debridement: subcutaneous tissue. ?Technique: Sharp excisional debridement to bleeding, viable wound base.  ?Dressing: Dry, sterile, compression dressing. ?Disposition: Patient tolerated procedure well. Patient to return in 2 week for follow-up. ? ?Return in about 2 weeks (around 03/25/2022) for wound check. ? ? ?Lorenda Peck, DPM  ? ? ?

## 2022-03-16 ENCOUNTER — Ambulatory Visit: Payer: Medicare Other | Attending: Internal Medicine

## 2022-03-16 ENCOUNTER — Other Ambulatory Visit (HOSPITAL_BASED_OUTPATIENT_CLINIC_OR_DEPARTMENT_OTHER): Payer: Self-pay

## 2022-03-16 DIAGNOSIS — Z23 Encounter for immunization: Secondary | ICD-10-CM

## 2022-03-16 MED ORDER — PFIZER COVID-19 VAC BIVALENT 30 MCG/0.3ML IM SUSP
INTRAMUSCULAR | 0 refills | Status: AC
  Filled 2022-03-16: qty 0.3, 1d supply, fill #0

## 2022-03-16 NOTE — Progress Notes (Signed)
   Covid-19 Vaccination Clinic  Name:  Elizabeth Wang    MRN: [id number] DOB: 09-May-1946  03/16/2022  [name] was observed post Covid-19 immunization for 15 minutes without incident. She was provided with Vaccine Information Sheet and instruction to access the V-Safe system.   [name] was instructed to call 911 with any severe reactions post vaccine: Difficulty breathing  Swelling of face and throat  A fast heartbeat  A bad rash all over body  Dizziness and weakness   Immunizations Administered     Name Date Dose VIS Date Route   PFIZER Comrnaty(Gray TOP) Covid-19 Vaccine 03/16/2022 10:15 AM 0.3 mL 06/25/2021 Intramuscular   Manufacturer: [place]   Lot: [id number]   [place]: [id number]

## 2022-03-17 ENCOUNTER — Other Ambulatory Visit: Payer: Medicare Other

## 2022-03-25 ENCOUNTER — Encounter: Payer: Self-pay | Admitting: Podiatry

## 2022-03-25 ENCOUNTER — Ambulatory Visit (INDEPENDENT_AMBULATORY_CARE_PROVIDER_SITE_OTHER): Payer: Medicare Other | Admitting: Podiatry

## 2022-03-25 DIAGNOSIS — L97521 Non-pressure chronic ulcer of other part of left foot limited to breakdown of skin: Secondary | ICD-10-CM | POA: Diagnosis not present

## 2022-03-25 DIAGNOSIS — L600 Ingrowing nail: Secondary | ICD-10-CM

## 2022-03-25 MED ORDER — CEPHALEXIN 500 MG PO CAPS: 500.0000 mg | ORAL_CAPSULE | Freq: Four times a day (QID) | ORAL | 0 refills | Status: AC

## 2022-03-25 NOTE — Progress Notes (Signed)
  Subjective:  Patient ID: Elizabeth Wang, female    DOB: December 12, 1945,   MRN: [id number]  Chief Complaint  Patient presents with   Wound Check    2 weeks wound check    76 y.o. female presents for follow-up of left hallux nail. Relates some pain. Relates the right third digit corn is back. Has been dressing as instructed. Relates third digit is most painful.  . Denies any other pedal complaints. Denies n/v/f/c.   Past Medical History:  Diagnosis Date   Acute pulmonary embolism ([place]) 06/2013   Arthritis    Asthma    years ago    Clotting disorder ([facility])    DVT/PE   Diverticulosis    minor    Dyslipidemia    Esophageal reflux    Hypertension    Hypothyroidism    Numbness    TOES   Obesity    OSA (obstructive sleep apnea)    severe OSA with an AHI of 51/hr and nocturnal hypoxemia with O2 sats at low as 69%.   Paroxysmal atrial fibrillation ([facility])    1 isolated episode in the setting of acute stress with no further episodes.    Pseudogout     Objective:  Physical Exam: Vascular: DP/PT pulses 2/4 bilateral. CFT <3 seconds. Normal hair growth on digits. No edema.  Skin. No lacerations or abrasions bilateral feet. Hyperkeratotic lesion noted to lateral third digit around nail plate and some incurvation of right third digit nail. Left hallux with superficial granular ulcer. Measures about 0.1 x 0.1 x 0.1 cm. No erythema edema or purulence noted.  Musculoskeletal: MMT 5/5 bilateral lower extremities in DF, PF, Inversion and Eversion. Deceased ROM in DF of ankle joint.  Neurological: Sensation intact to light touch.   Assessment:   1. Skin ulcer of left great toe, limited to breakdown of skin ([place])   2. Ingrown nail of third toe of right foot       Plan:  Patient was evaluated and treated and all questions answered. Ulcer left hallux wound limited to breakdown of skin  -Debridement as below. -Dressed with betadine, DSD. -Off-loading with toe cap -Keflex sent for right third digit  ingrown.  -Right third digit debrided as courtesy and toe cap provided. Advised to soak and use neosporin and bandaid on area.  -Discussed glucose control and proper protein-rich diet.  -Discussed if any worsening redness, pain, fever or chills to call or may need to report to the emergency room. Patient expressed understanding.   Procedure: Excisional Debridement of Wound Rationale: Removal of non-viable soft tissue from the wound to promote healing.  Anesthesia: none Pre-Debridement Wound Measurements: overlying callus  Post-Debridement Wound Measurements: 0.1 cm x 0.1 cm x 0.1 cm  Type of Debridement: Sharp Excisional Tissue Removed: Non-viable soft tissue Depth of Debridement: subcutaneous tissue. Technique: Sharp excisional debridement to bleeding, viable wound base.  Dressing: Dry, sterile, compression dressing. Disposition: Patient tolerated procedure well. Patient to return in 2 week for follow-up.  Return in about 2 weeks (around 04/08/2022) for wound check.   Lorenda Peck, DPM

## 2022-04-07 ENCOUNTER — Other Ambulatory Visit: Payer: Self-pay

## 2022-04-07 MED ORDER — DILTIAZEM HCL ER COATED BEADS 360 MG PO CP24: 360.0000 mg | ORAL_CAPSULE | Freq: Every day | ORAL | 3 refills | Status: DC

## 2022-04-08 ENCOUNTER — Ambulatory Visit (INDEPENDENT_AMBULATORY_CARE_PROVIDER_SITE_OTHER): Payer: Medicare Other | Admitting: Podiatry

## 2022-04-08 ENCOUNTER — Encounter: Payer: Self-pay | Admitting: Podiatry

## 2022-04-08 DIAGNOSIS — L97521 Non-pressure chronic ulcer of other part of left foot limited to breakdown of skin: Secondary | ICD-10-CM | POA: Diagnosis not present

## 2022-04-08 DIAGNOSIS — G629 Polyneuropathy, unspecified: Secondary | ICD-10-CM

## 2022-04-08 NOTE — Progress Notes (Signed)
  Subjective:  Patient ID: Elizabeth Wang, female    DOB: 1946/01/28,   MRN: [id number]  Chief Complaint  Patient presents with   Wound Check    Left foot wound check    76 y.o. female presents for follow-up of left hallux wound. Relates it is doing better. Relates some pain. Relates the right third toe is doing much better. Has finished course of antibiotics although did only take the first week of prescription and stopped due to stomach issues. She also recently got a pedicure on both feet. Has not been wearing surgical shoe or offloading toe caps.  Has been dressing as instructed.  Denies any other pedal complaints. Denies n/v/f/c.   Past Medical History:  Diagnosis Date   Acute pulmonary embolism ([place]) 06/2013   Arthritis    Asthma    years ago    Clotting disorder ([facility])    DVT/PE   Diverticulosis    minor    Dyslipidemia    Esophageal reflux    Hypertension    Hypothyroidism    Numbness    TOES   Obesity    OSA (obstructive sleep apnea)    severe OSA with an AHI of 51/hr and nocturnal hypoxemia with O2 sats at low as 69%.   Paroxysmal atrial fibrillation ([facility])    1 isolated episode in the setting of acute stress with no further episodes.    Pseudogout     Objective:  Physical Exam: Vascular: DP/PT pulses 2/4 bilateral. CFT <3 seconds. Normal hair growth on digits. No edema.  Skin. No lacerations or abrasions bilateral feet. Hyperkeratotic lesion noted to lateral third digit around nail plate and some incurvation of right third digit nail. Left hallux with superficial granular ulcer. Measures about 0.1 x 0.1 x 0.1 cm. No erythema edema or purulence noted.  Musculoskeletal: MMT 5/5 bilateral lower extremities in DF, PF, Inversion and Eversion. Deceased ROM in DF of ankle joint.  Neurological: Sensation intact to light touch.   Assessment:   1. Skin ulcer of left great toe, limited to breakdown of skin ([place])   2. Neuropathy       Plan:  Patient was evaluated and treated  and all questions answered. Ulcer left hallux wound limited to breakdown of skin  -Debridement as below. -Dressed with betadine, DSD. -Off-loading with toe cap. Patient will not wear any surgical shoe.  -Discussed avoiding any pedicures and staying off of this foot as much as possible.  -Discussed glucose control and proper protein-rich diet.  -Discussed if any worsening redness, pain, fever or chills to call or may need to report to the emergency room. Patient expressed understanding.   Procedure: Excisional Debridement of Wound Rationale: Removal of non-viable soft tissue from the wound to promote healing.  Anesthesia: none Pre-Debridement Wound Measurements: overlying callus  Post-Debridement Wound Measurements: 0.1 cm x 0.1 cm x 0.1 cm  Type of Debridement: Sharp Excisional Tissue Removed: Non-viable soft tissue Depth of Debridement: subcutaneous tissue. Technique: Sharp excisional debridement to bleeding, viable wound base.  Dressing: Dry, sterile, compression dressing. Disposition: Patient tolerated procedure well. Patient to return in 2 week for follow-up.  Return in about 2 weeks (around 04/22/2022) for wound check.   Lorenda Peck, DPM

## 2022-04-21 ENCOUNTER — Other Ambulatory Visit: Payer: Self-pay | Admitting: *Deleted

## 2022-04-21 DIAGNOSIS — I10 Essential (primary) hypertension: Secondary | ICD-10-CM

## 2022-04-21 DIAGNOSIS — I480 Paroxysmal atrial fibrillation: Secondary | ICD-10-CM

## 2022-04-21 DIAGNOSIS — E782 Mixed hyperlipidemia: Secondary | ICD-10-CM

## 2022-04-21 MED ORDER — HYDROCHLOROTHIAZIDE 25 MG PO TABS: 25.0000 mg | ORAL_TABLET | Freq: Every day | ORAL | 3 refills | Status: DC

## 2022-04-22 ENCOUNTER — Encounter: Payer: Self-pay | Admitting: Podiatry

## 2022-04-22 ENCOUNTER — Ambulatory Visit (INDEPENDENT_AMBULATORY_CARE_PROVIDER_SITE_OTHER): Payer: Medicare Other | Admitting: Podiatry

## 2022-04-22 DIAGNOSIS — L97521 Non-pressure chronic ulcer of other part of left foot limited to breakdown of skin: Secondary | ICD-10-CM | POA: Diagnosis not present

## 2022-04-22 DIAGNOSIS — G629 Polyneuropathy, unspecified: Secondary | ICD-10-CM

## 2022-04-22 NOTE — Progress Notes (Signed)
  Subjective:  Patient ID: Elizabeth Wang, female    DOB: 06/21/1946,   MRN: [id number]  No chief complaint on file.   76 y.o. female presents for follow-up of left hallux wound. Relates it is doing better. Relates some pain. Relates the right third toe is doing much better. Has not been wearing surgical shoe or offloading toe caps. Went to the beach this past week and relates she did not always have it dressed but did not go in the water. Relates may have taken a step back with the wound.  Has been dressing as instructed.  Denies any other pedal complaints. Denies n/v/f/c.   Past Medical History:  Diagnosis Date   Acute pulmonary embolism ([place]) 06/2013   Arthritis    Asthma    years ago    Clotting disorder ([facility])    DVT/PE   Diverticulosis    minor    Dyslipidemia    Esophageal reflux    Hypertension    Hypothyroidism    Numbness    TOES   Obesity    OSA (obstructive sleep apnea)    severe OSA with an AHI of 51/hr and nocturnal hypoxemia with O2 sats at low as 69%.   Paroxysmal atrial fibrillation ([facility])    1 isolated episode in the setting of acute stress with no further episodes.    Pseudogout     Objective:  Physical Exam: Vascular: DP/PT pulses 2/4 bilateral. CFT <3 seconds. Normal hair growth on digits. No edema.  Skin. No lacerations or abrasions bilateral feet. Hyperkeratotic lesion noted to lateral third digit around nail plate and some incurvation of right third digit nail. Left hallux with superficial granular ulcer. Measures about 0.4 x 0.4 x 0.1 cm. No erythema edema or purulence noted.  Musculoskeletal: MMT 5/5 bilateral lower extremities in DF, PF, Inversion and Eversion. Deceased ROM in DF of ankle joint.  Neurological: Sensation intact to light touch.   Assessment:   1. Skin ulcer of left great toe, limited to breakdown of skin ([place])   2. Neuropathy       Plan:  Patient was evaluated and treated and all questions answered. Ulcer left hallux wound limited to  breakdown of skin  -Debridement as below. -Dressed with betadine, DSD. -Off-loading with toe cap. Patient will not wear any surgical shoe.  -Discussed avoiding any pedicures and staying off of this foot as much as possible. [place] this [place] and advised to keep dressed and stay out of the water.  -Discussed glucose control and proper protein-rich diet.  -Discussed if any worsening redness, pain, fever or chills to call or may need to report to the emergency room. Patient expressed understanding.   Procedure: Excisional Debridement of Wound Rationale: Removal of non-viable soft tissue from the wound to promote healing.  Anesthesia: none Pre-Debridement Wound Measurements: overlying callus  Post-Debridement Wound Measurements: 0.4 cm x 0.4 cm x 0.1 cm  Type of Debridement: Sharp Excisional Tissue Removed: Non-viable soft tissue Depth of Debridement: subcutaneous tissue. Technique: Sharp excisional debridement to bleeding, viable wound base.  Dressing: Dry, sterile, compression dressing. Disposition: Patient tolerated procedure well. Patient to return in 2 week for follow-up.  No follow-ups on file.   Lorenda Peck, DPM

## 2022-05-03 NOTE — Progress Notes (Unsigned)
Virtual Visit via Video Note   This visit type was conducted due to national recommendations for restrictions regarding the COVID-19 Pandemic (e.g. social distancing) in an effort to limit this patient's exposure and mitigate transmission in our community.  Due to her co-morbid illnesses, this patient is at least at moderate risk for complications without adequate follow up.  This format is felt to be most appropriate for this patient at this time.  All issues noted in this document were discussed and addressed.  A limited physical exam was performed with this format.  Please refer to the patient's chart for her consent to telehealth for [facility].   Evaluation Performed:  Follow-up visit   Date:  05/03/2022   ID:  Elizabeth Wang, DOB 09-22-1946, MRN [id number]  Patient Location:  Home  Provider location:   [place]  PCP:  Ginger Organ., MD  Cardiologist:  Gwyndolyn Kaufman, MD  Sleep Medicine:  Fransico Him, MD Electrophysiologist:  Vickie Epley, MD   Chief Complaint:  OSA  History of Present Illness:    Elizabeth Wang is a 76 y.o. female who presents via audio/video conferencing for a telehealth visit today.    This is a 76yo female with a hx of PAF, HTN and DVT/PE. She was referred for a split night sleep study which showed severe OSA with an AHI of 51.2/hr, no central apneas and severe nocturnal hypoxemia with O2 sats as low as 69%.  She was unable to be titrated on CPAP due to ongoing respiratory events and underwent BIPAP and is now on auto BiPAP.    SHe is doing well with her CPAP device and thinks that she has gotten used to it.  she tolerates the mask and feels the pressure is adequate.  Since going on CPAP she feels rested in the am and has no significant daytime sleepiness.  She denies any significant mouth or nasal dryness or nasal congestion.  She does not think that he snores.    Prior CV studies:   The following studies were reviewed today:  Split night  sleep study, BiPAP titration and PAP compliance download  Past Medical History:  Diagnosis Date   Acute pulmonary embolism ([place]) 06/2013   Arthritis    Asthma    years ago    Clotting disorder (HCC)    DVT/PE   Diverticulosis    minor    Dyslipidemia    Esophageal reflux    Hypertension    Hypothyroidism    Numbness    TOES   Obesity    OSA (obstructive sleep apnea)    severe OSA with an AHI of 51/hr and nocturnal hypoxemia with O2 sats at low as 69%.   Paroxysmal atrial fibrillation (HCC)    1 isolated episode in the setting of acute stress with no further episodes.    Pseudogout    Past Surgical History:  Procedure Laterality Date   BREAST RECONSTRUCTION     CARDIOVERSION N/A 06/27/2020   Procedure: CARDIOVERSION;  Surgeon: [name], MD;  Location: [facility];  Service: Cardiovascular;  Laterality: N/A;   CESAREAN SECTION     COLONOSCOPY  2004   repeat in ten years   KNEE ARTHROSCOPY Left    TONSILLECTOMY     TOTAL KNEE ARTHROPLASTY Right 01/01/2014   Procedure: TOTAL RIGHT KNEE ARTHROPLASTY;  Surgeon: [name], MD;  Location: [facility];  Service: Orthopedics;  Laterality: Right;   TOTAL KNEE ARTHROPLASTY Left 07/23/2014   Procedure:  LEFT TOTAL KNEE ARTHROPLASTY;  Surgeon: [name], MD;  Location: [facility];  Service: Orthopedics;  Laterality: Left;   tummy tuck  1987   WISDOM TOOTH EXTRACTION       No outpatient medications have been marked as taking for the 05/05/22 encounter (Appointment) with [name], MD.     Allergies:   Nsaids, Bee venom, Other, and Codeine   Social History   Tobacco Use   Smoking status: Former    Packs/day: 0.25    Years: 12.00    Total pack years: 3.00    Types: Cigarettes    Quit date: 12/28/1975    Years since quitting: 46.3   Smokeless tobacco: Never  Substance Use Topics   Alcohol use: Yes    Alcohol/week: 4.0 standard drinks of alcohol    Types: 4 Glasses of wine per week    Comment: socially   Drug use:  No     Family Hx: The patient's family history includes CVA in her mother; Coronary artery disease in her father; Heart disease in her father; Hypertension in her father.  ROS:   Please see the history of present illness.     All other systems reviewed and are negative.   Labs/Other Tests and Data Reviewed:    Recent Labs: 02/19/2022: BUN 20; Creatinine, Ser 0.98; Hemoglobin 13.1; Platelets 190; Potassium 4.0; Sodium 137   Recent Lipid Panel Lab Results  Component Value Date/Time   CHOL 138 12/01/2019 03:29 PM   TRIG 168 (H) 12/01/2019 03:29 PM   HDL 46 12/01/2019 03:29 PM   CHOLHDL 3.0 12/01/2019 03:29 PM   LDLCALC 64 12/01/2019 03:29 PM    Wt Readings from Last 3 Encounters:  03/02/22 228 lb 12.8 oz (103.8 kg)  02/19/22 231 lb (104.8 kg)  05/23/21 218 lb 12.8 oz (99.2 kg)     Objective:    Vital Signs:  LMP  (LMP Unknown)    Well nourished, well developed female in no acute distress. Well appearing, alert and conversant, regular work of breathing,  good skin color  Eyes- anicteric mouth- oral mucosa is pink  neuro- grossly intact skin- no apparent rash or lesions or cyanosis  ASSESSMENT & PLAN:    1.  OSA - The patient is tolerating PAP therapy well without any problems. The PAP download performed by his DME was personally reviewed and interpreted by me today and showed an AHI of ***/hr on *** cm H2O with ***% compliance in using more than 4 hours nightly.  The patient has been using and benefiting from PAP use and will continue to benefit from therapy.     2.  HTN -BP controlled at home -continue prescription drug management with HCTZ '25mg'$  daily, Cardizem CD '240mg'$  daily and Telmisartan '80mg'$  daily with PRN refills   Time:   Today, I have spent 20 minutes on telemedicine discussing medical problems including OSA, HTN and morbid obesity and reviewing patient's chart including Split night sleep study, BiPAP titration and PAP compliance download.  Medication  Adjustments/Labs and Tests Ordered: Current medicines are reviewed at length with the patient today.  Concerns regarding medicines are outlined above.  Tests Ordered: No orders of the defined types were placed in this encounter.  Medication Changes: No orders of the defined types were placed in this encounter.   Disposition:  Follow up in 1 year(s)  Signed, Fransico Him, MD  05/03/2022 9:29 PM    [place]

## 2022-05-05 ENCOUNTER — Telehealth (INDEPENDENT_AMBULATORY_CARE_PROVIDER_SITE_OTHER): Payer: Medicare Other | Admitting: Cardiology

## 2022-05-05 VITALS — Ht 63.0 in

## 2022-05-05 DIAGNOSIS — G4733 Obstructive sleep apnea (adult) (pediatric): Secondary | ICD-10-CM

## 2022-05-05 DIAGNOSIS — I10 Essential (primary) hypertension: Secondary | ICD-10-CM

## 2022-05-05 NOTE — Patient Instructions (Signed)
Medication Instructions:  Your physician recommends that you continue on your current medications as directed. Please refer to the Current Medication list given to you today.  *If you need a refill on your cardiac medications before your next appointment, please call your pharmacy*   Follow-Up: At [facility], you and your health needs are our priority.  As part of our continuing mission to provide you with exceptional heart care, we have created designated Provider Care Teams.  These Care Teams include your primary Cardiologist (physician) and Advanced Practice Providers (APPs -  Physician Assistants and Nurse Practitioners) who all work together to provide you with the care you need, when you need it.   Your next appointment:   1 year(s)  The format for your next appointment:   In Person  Provider:   Dr. Fransico Him  If primary card or EP is not listed click here to update    :1}    Important Information About Sugar

## 2022-05-06 ENCOUNTER — Ambulatory Visit (INDEPENDENT_AMBULATORY_CARE_PROVIDER_SITE_OTHER): Payer: Medicare Other | Admitting: Podiatry

## 2022-05-06 DIAGNOSIS — L97521 Non-pressure chronic ulcer of other part of left foot limited to breakdown of skin: Secondary | ICD-10-CM | POA: Diagnosis not present

## 2022-05-06 DIAGNOSIS — G629 Polyneuropathy, unspecified: Secondary | ICD-10-CM

## 2022-05-06 NOTE — Progress Notes (Signed)
  Subjective:  Patient ID: Elizabeth Wang, female    DOB: 01-Aug-1946,   MRN: [id number]  Chief Complaint  Patient presents with   Wound Check    3 wk wound check    76 y.o. female presents for follow-up of left hallux wound. Relates it is doing better.  Has not been wearing surgical shoe or offloading toe caps[place] Relates wound is doing better.   Has been dressing as instructed.  Denies any other pedal complaints. Denies n/v/f/c.   Past Medical History:  Diagnosis Date   Acute pulmonary embolism ([place]) 06/2013   Arthritis    Asthma    years ago    Clotting disorder (HCC)    DVT/PE   Diverticulosis    minor    Dyslipidemia    Esophageal reflux    Hypertension    Hypothyroidism    Numbness    TOES   Obesity    OSA (obstructive sleep apnea)    severe OSA with an AHI of 51/hr and nocturnal hypoxemia with O2 sats at low as 69%.   Paroxysmal atrial fibrillation (HCC)    1 isolated episode in the setting of acute stress with no further episodes.    Pseudogout     Objective:  Physical Exam: Vascular: DP/PT pulses 2/4 bilateral. CFT <3 seconds. Normal hair growth on digits. No edema.  Skin. No lacerations or abrasions bilateral feet. Hyperkeratotic lesion noted to lateral third digit around nail plate and some incurvation of right third digit nail. Left hallux with superficial granular ulcer. Measures about 0.3 x 0.3 x 0.1 cm. No erythema edema or purulence noted.  Musculoskeletal: MMT 5/5 bilateral lower extremities in DF, PF, Inversion and Eversion. Deceased ROM in DF of ankle joint.  Neurological: Sensation intact to light touch.   Assessment:   1. Skin ulcer of left great toe, limited to breakdown of skin ([place])   2. Neuropathy       Plan:  Patient was evaluated and treated and all questions answered. Ulcer left hallux wound limited to breakdown of skin  -Debridement as below. -Dressed with betadine, DSD. -Off-loading with toe cap.  Patient will not wear any surgical shoe.  -Discussed avoiding any pedicures and staying off of this foot as much as possible. [place] and advised to keep dressed and stay out of the water.  -Discussed glucose control and proper protein-rich diet.  -Discussed if any worsening redness, pain, fever or chills to call or may need to report to the emergency room. Patient expressed understanding.   Procedure: Excisional Debridement of Wound Rationale: Removal of non-viable soft tissue from the wound to promote healing.  Anesthesia: none Pre-Debridement Wound Measurements: overlying callus  Post-Debridement Wound Measurements: 0.3 cm x 0.3 cm x 0.1 cm  Type of Debridement: Sharp Excisional Tissue Removed: Non-viable soft tissue Depth of Debridement: subcutaneous tissue. Technique: Sharp excisional debridement to bleeding, viable wound base.  Dressing: Dry, sterile, compression dressing. Disposition: Patient tolerated procedure well. Patient to return in 2 week for follow-up.  Return in about 2 weeks (around 05/20/2022) for wound check.   Lorenda Peck, DPM

## 2022-05-20 ENCOUNTER — Encounter: Payer: Self-pay | Admitting: Podiatry

## 2022-05-20 ENCOUNTER — Ambulatory Visit (INDEPENDENT_AMBULATORY_CARE_PROVIDER_SITE_OTHER): Payer: Medicare Other | Admitting: Podiatry

## 2022-05-20 DIAGNOSIS — I10 Essential (primary) hypertension: Secondary | ICD-10-CM | POA: Diagnosis not present

## 2022-05-20 DIAGNOSIS — E785 Hyperlipidemia, unspecified: Secondary | ICD-10-CM | POA: Diagnosis not present

## 2022-05-20 DIAGNOSIS — L97521 Non-pressure chronic ulcer of other part of left foot limited to breakdown of skin: Secondary | ICD-10-CM | POA: Diagnosis not present

## 2022-05-20 DIAGNOSIS — G629 Polyneuropathy, unspecified: Secondary | ICD-10-CM | POA: Diagnosis not present

## 2022-05-20 DIAGNOSIS — R7301 Impaired fasting glucose: Secondary | ICD-10-CM | POA: Diagnosis not present

## 2022-05-20 DIAGNOSIS — E039 Hypothyroidism, unspecified: Secondary | ICD-10-CM | POA: Diagnosis not present

## 2022-05-20 DIAGNOSIS — R7989 Other specified abnormal findings of blood chemistry: Secondary | ICD-10-CM | POA: Diagnosis not present

## 2022-05-20 NOTE — Progress Notes (Signed)
  Subjective:  Patient ID: Elizabeth Wang, female    DOB: 14-May-1946,   MRN: [id number]  No chief complaint on file.   76 y.o. female presents for follow-up of left hallux wound. Relates it is doing maybe a little worse. Has been on it more and relates more drainage.   Has been dressing as instructed her husband helps her with the dresings.  Denies any other pedal complaints. Denies n/v/f/c.   Past Medical History:  Diagnosis Date   Acute pulmonary embolism ([place]) 06/2013   Arthritis    Asthma    years ago    Clotting disorder (HCC)    DVT/PE   Diverticulosis    minor    Dyslipidemia    Esophageal reflux    Hypertension    Hypothyroidism    Numbness    TOES   Obesity    OSA (obstructive sleep apnea)    severe OSA with an AHI of 51/hr and nocturnal hypoxemia with O2 sats at low as 69%.   Paroxysmal atrial fibrillation (HCC)    1 isolated episode in the setting of acute stress with no further episodes.    Pseudogout     Objective:  Physical Exam: Vascular: DP/PT pulses 2/4 bilateral. CFT <3 seconds. Normal hair growth on digits. No edema.  Skin. No lacerations or abrasions bilateral feet. Hyperkeratotic lesion noted to lateral third digit around nail plate and some incurvation of right third digit nail. Left hallux with superficial granular ulcer. Measures about 0.2 x 0.3 x 0.1 cm. No erythema edema or purulence noted.  Musculoskeletal: MMT 5/5 bilateral lower extremities in DF, PF, Inversion and Eversion. Deceased ROM in DF of ankle joint.  Neurological: Sensation intact to light touch.   Assessment:   1. Skin ulcer of left great toe, limited to breakdown of skin ([place])   2. Neuropathy       Plan:  Patient was evaluated and treated and all questions answered. Ulcer left hallux wound limited to breakdown of skin  -Debridement as below. -Dressed with betadine, DSD. -Off-loading with aperture pads.  Patient will not wear any surgical shoe.  -Discussed avoiding any pedicures  and staying off of this foot as much as possible.  -Discussed glucose control and proper protein-rich diet.  -Discussed if any worsening redness, pain, fever or chills to call or may need to report to the emergency room. Patient expressed understanding.   Procedure: Excisional Debridement of Wound Rationale: Removal of non-viable soft tissue from the wound to promote healing.  Anesthesia: none Pre-Debridement Wound Measurements: overlying callus  Post-Debridement Wound Measurements: 0.2 cm x 0.3 cm x 0.1 cm  Type of Debridement: Sharp Excisional Tissue Removed: Non-viable soft tissue Depth of Debridement: subcutaneous tissue. Technique: Sharp excisional debridement to bleeding, viable wound base.  Dressing: Dry, sterile, compression dressing. Disposition: Patient tolerated procedure well. Patient to return in 2 week for follow-up.  Return in about 2 weeks (around 06/03/2022) for wound check.   Lorenda Peck, DPM

## 2022-05-27 DIAGNOSIS — I2510 Atherosclerotic heart disease of native coronary artery without angina pectoris: Secondary | ICD-10-CM | POA: Diagnosis not present

## 2022-05-27 DIAGNOSIS — Z1331 Encounter for screening for depression: Secondary | ICD-10-CM | POA: Diagnosis not present

## 2022-05-27 DIAGNOSIS — R82998 Other abnormal findings in urine: Secondary | ICD-10-CM | POA: Diagnosis not present

## 2022-05-27 DIAGNOSIS — D6869 Other thrombophilia: Secondary | ICD-10-CM | POA: Diagnosis not present

## 2022-05-27 DIAGNOSIS — I10 Essential (primary) hypertension: Secondary | ICD-10-CM | POA: Diagnosis not present

## 2022-05-27 DIAGNOSIS — N1831 Chronic kidney disease, stage 3a: Secondary | ICD-10-CM | POA: Diagnosis not present

## 2022-05-27 DIAGNOSIS — E785 Hyperlipidemia, unspecified: Secondary | ICD-10-CM | POA: Diagnosis not present

## 2022-05-27 DIAGNOSIS — Z0000 Encounter for general adult medical examination without abnormal findings: Secondary | ICD-10-CM | POA: Diagnosis not present

## 2022-05-27 DIAGNOSIS — I872 Venous insufficiency (chronic) (peripheral): Secondary | ICD-10-CM | POA: Diagnosis not present

## 2022-05-27 DIAGNOSIS — I129 Hypertensive chronic kidney disease with stage 1 through stage 4 chronic kidney disease, or unspecified chronic kidney disease: Secondary | ICD-10-CM | POA: Diagnosis not present

## 2022-05-27 DIAGNOSIS — I480 Paroxysmal atrial fibrillation: Secondary | ICD-10-CM | POA: Diagnosis not present

## 2022-05-27 DIAGNOSIS — Z7901 Long term (current) use of anticoagulants: Secondary | ICD-10-CM | POA: Diagnosis not present

## 2022-05-27 DIAGNOSIS — Z1339 Encounter for screening examination for other mental health and behavioral disorders: Secondary | ICD-10-CM | POA: Diagnosis not present

## 2022-05-27 DIAGNOSIS — R7301 Impaired fasting glucose: Secondary | ICD-10-CM | POA: Diagnosis not present

## 2022-06-01 ENCOUNTER — Other Ambulatory Visit: Payer: Self-pay | Admitting: Cardiology

## 2022-06-01 DIAGNOSIS — I4819 Other persistent atrial fibrillation: Secondary | ICD-10-CM

## 2022-06-01 NOTE — Telephone Encounter (Signed)
Prescription refill request for Xarelto received.  Indication: Atrial Fib Last office visit: 03/02/22  Shary Key MD Weight: 103.8kg Age: 76 Scr: 0.98 on 02/19/22 CrCl: 80.03  Based on above findings Xarelto '20mg'$  daily is the appropriate dose.  Refill approved,

## 2022-06-03 ENCOUNTER — Encounter: Payer: Self-pay | Admitting: Podiatry

## 2022-06-03 ENCOUNTER — Ambulatory Visit (INDEPENDENT_AMBULATORY_CARE_PROVIDER_SITE_OTHER): Payer: Medicare Other | Admitting: Podiatry

## 2022-06-03 DIAGNOSIS — L97521 Non-pressure chronic ulcer of other part of left foot limited to breakdown of skin: Secondary | ICD-10-CM | POA: Diagnosis not present

## 2022-06-03 DIAGNOSIS — G629 Polyneuropathy, unspecified: Secondary | ICD-10-CM

## 2022-06-03 NOTE — Progress Notes (Signed)
  Subjective:  Patient ID: Elizabeth Wang, female    DOB: 02-06-1946,   MRN: [id number]  Chief Complaint  Patient presents with   Wound Check    Patient states husband does not think wound is doing this week , states callus build up , patient states husband brought callus /corn pads    76 y.o. female presents for follow-up of left hallux wound. Relates it is doing better. Has been using padding on it. [place].    Has been dressing as instructed her husband helps her with the dresings.  Denies any other pedal complaints. Denies n/v/f/c.   Past Medical History:  Diagnosis Date   Acute pulmonary embolism ([place]) 06/2013   Arthritis    Asthma    years ago    Clotting disorder (HCC)    DVT/PE   Diverticulosis    minor    Dyslipidemia    Esophageal reflux    Hypertension    Hypothyroidism    Numbness    TOES   Obesity    OSA (obstructive sleep apnea)    severe OSA with an AHI of 51/hr and nocturnal hypoxemia with O2 sats at low as 69%.   Paroxysmal atrial fibrillation (HCC)    1 isolated episode in the setting of acute stress with no further episodes.    Pseudogout     Objective:  Physical Exam: Vascular: DP/PT pulses 2/4 bilateral. CFT <3 seconds. Normal hair growth on digits. No edema.  Skin. No lacerations or abrasions bilateral feet. Hyperkeratotic lesion noted to lateral third digit around nail plate and some incurvation of right third digit nail. Left hallux with superficial granular ulcer. Measures about 0.2 x 0.1 x 0.1 cm. No erythema edema or purulence noted.  Musculoskeletal: MMT 5/5 bilateral lower extremities in DF, PF, Inversion and Eversion. Deceased ROM in DF of ankle joint.  Neurological: Sensation intact to light touch.   Assessment:   1. Skin ulcer of left great toe, limited to breakdown of skin ([employer])   2. Neuropathy        Plan:  Patient was evaluated and treated and all questions answered. Ulcer left hallux wound  limited to breakdown of skin  -Debridement as below. -Dressed with betadine, DSD. -Off-loading with aperture pads.  Patient will not wear any surgical shoe.  -Discussed avoiding any pedicures and staying off of this foot as much as possible.  -Discussed glucose control and proper protein-rich diet.  -Discussed if any worsening redness, pain, fever or chills to call or may need to report to the emergency room. Patient expressed understanding.   Procedure: Excisional Debridement of Wound Rationale: Removal of non-viable soft tissue from the wound to promote healing.  Anesthesia: none Pre-Debridement Wound Measurements: overlying callus  Post-Debridement Wound Measurements: 0.2 cm x 0.1 cm x 0.1 cm  Type of Debridement: Sharp Excisional Tissue Removed: Non-viable soft tissue Depth of Debridement: subcutaneous tissue. Technique: Sharp excisional debridement to bleeding, viable wound base.  Dressing: Dry, sterile, compression dressing. Disposition: Patient tolerated procedure well. Patient to return in 2 week for follow-up.  Return in about 2 weeks (around 06/17/2022) for wound check.   Lorenda Peck, DPM

## 2022-06-17 ENCOUNTER — Ambulatory Visit (INDEPENDENT_AMBULATORY_CARE_PROVIDER_SITE_OTHER): Payer: Medicare Other | Admitting: Podiatry

## 2022-06-17 ENCOUNTER — Encounter: Payer: Self-pay | Admitting: Podiatry

## 2022-06-17 DIAGNOSIS — G629 Polyneuropathy, unspecified: Secondary | ICD-10-CM | POA: Diagnosis not present

## 2022-06-17 DIAGNOSIS — L97521 Non-pressure chronic ulcer of other part of left foot limited to breakdown of skin: Secondary | ICD-10-CM | POA: Diagnosis not present

## 2022-06-17 NOTE — Progress Notes (Signed)
  Subjective:  Patient ID: Elizabeth Wang, female    DOB: Mar 25, 1946,   MRN: [id number]  No chief complaint on file.   76 y.o. female presents for follow-up of left hallux wound. Relates it is doing better. Has been using padding on it.    Has been dressing as instructed her husband helps her with the dresings.  Denies any other pedal complaints. Denies n/v/f/c.   Past Medical History:  Diagnosis Date   Acute pulmonary embolism ([place]) 06/2013   Arthritis    Asthma    years ago    Clotting disorder (HCC)    DVT/PE   Diverticulosis    minor    Dyslipidemia    Esophageal reflux    Hypertension    Hypothyroidism    Numbness    TOES   Obesity    OSA (obstructive sleep apnea)    severe OSA with an AHI of 51/hr and nocturnal hypoxemia with O2 sats at low as 69%.   Paroxysmal atrial fibrillation (HCC)    1 isolated episode in the setting of acute stress with no further episodes.    Pseudogout     Objective:  Physical Exam: Vascular: DP/PT pulses 2/4 bilateral. CFT <3 seconds. Normal hair growth on digits. No edema.  Skin. No lacerations or abrasions bilateral feet. Hyperkeratotic lesion noted to lateral third digit around nail plate and some incurvation of right third digit nail. Left hallux with superficial granular ulcer. Measures about 0.2 x 0.1 x 0.1 cm. No erythema edema or purulence noted.  Musculoskeletal: MMT 5/5 bilateral lower extremities in DF, PF, Inversion and Eversion. Deceased ROM in DF of ankle joint.  Neurological: Sensation intact to light touch.   Assessment:   1. Skin ulcer of left great toe, limited to breakdown of skin ([place])   2. Neuropathy        Plan:  Patient was evaluated and treated and all questions answered. Ulcer left hallux wound limited to breakdown of skin  -Debridement as below. -Dressed with betadine, DSD. -Off-loading with aperture pads.  Patient will not wear any surgical shoe.  -Discussed avoiding any pedicures and staying off of this foot  as much as possible.  -Discussed glucose control and proper protein-rich diet.  -Discussed if any worsening redness, pain, fever or chills to call or may need to report to the emergency room. Patient expressed understanding.  -Discussed possible surgery to straighten hallux hammertoe to prevent future ulceration. Patient will consider after we get full improvement of this wound.   Procedure: Excisional Debridement of Wound Rationale: Removal of non-viable soft tissue from the wound to promote healing.  Anesthesia: none Pre-Debridement Wound Measurements: overlying callus  Post-Debridement Wound Measurements: 0.2 cm x 0.1 cm x 0.1 cm  Type of Debridement: Sharp Excisional Tissue Removed: Non-viable soft tissue Depth of Debridement: subcutaneous tissue. Technique: Sharp excisional debridement to bleeding, viable wound base.  Dressing: Dry, sterile, compression dressing. Disposition: Patient tolerated procedure well. Patient to return in 2 week for follow-up.  Return in about 3 weeks (around 07/08/2022) for wound check.   Lorenda Peck, DPM

## 2022-06-30 ENCOUNTER — Encounter: Payer: Self-pay | Admitting: Podiatry

## 2022-06-30 ENCOUNTER — Ambulatory Visit (INDEPENDENT_AMBULATORY_CARE_PROVIDER_SITE_OTHER): Payer: Medicare Other | Admitting: Podiatry

## 2022-06-30 DIAGNOSIS — L97521 Non-pressure chronic ulcer of other part of left foot limited to breakdown of skin: Secondary | ICD-10-CM | POA: Diagnosis not present

## 2022-06-30 DIAGNOSIS — G629 Polyneuropathy, unspecified: Secondary | ICD-10-CM | POA: Diagnosis not present

## 2022-06-30 NOTE — Progress Notes (Signed)
  Subjective:  Patient ID: Elizabeth Wang, female    DOB: 25-Feb-1946,   MRN: [id number]  Chief Complaint  Patient presents with   Wound Check     Patient states she feels that her wound was better 2 weeks ago. 5 or 6 pain level out of 10. Feels like callus is pressing into her toenail .     76 y.o. female presents for follow-up of left hallux wound. Relates it was doing better but was on it more this week and maybe worse.  Has been using padding on it.    Has been dressing as instructed her husband helps her with the dresings.  Denies any other pedal complaints. Denies n/v/f/c.   Past Medical History:  Diagnosis Date   Acute pulmonary embolism ([place]) 06/2013   Arthritis    Asthma    years ago    Clotting disorder (HCC)    DVT/PE   Diverticulosis    minor    Dyslipidemia    Esophageal reflux    Hypertension    Hypothyroidism    Numbness    TOES   Obesity    OSA (obstructive sleep apnea)    severe OSA with an AHI of 51/hr and nocturnal hypoxemia with O2 sats at low as 69%.   Paroxysmal atrial fibrillation (HCC)    1 isolated episode in the setting of acute stress with no further episodes.    Pseudogout     Objective:  Physical Exam: Vascular: DP/PT pulses 2/4 bilateral. CFT <3 seconds. Normal hair growth on digits. No edema.  Skin. No lacerations or abrasions bilateral feet. Hyperkeratotic lesion noted to lateral third digit around nail plate and some incurvation of right third digit nail. Left hallux with superficial granular ulcer. Measures about 0.2 x 0.2 x 0.1 cm. No erythema edema or purulence noted.  Musculoskeletal: MMT 5/5 bilateral lower extremities in DF, PF, Inversion and Eversion. Deceased ROM in DF of ankle joint.  Neurological: Sensation intact to light touch.   Assessment:   1. Skin ulcer of left great toe, limited to breakdown of skin ([place])   2. Neuropathy        Plan:  Patient was evaluated and treated and all questions answered. Ulcer left hallux wound  limited to breakdown of skin  -Debridement as below. -Dressed with betadine, DSD. -Off-loading with aperture pads.  Patient will not wear any surgical shoe.  -Discussed avoiding any pedicures and staying off of this foot as much as possible.  -Discussed glucose control and proper protein-rich diet.  -Discussed if any worsening redness, pain, fever or chills to call or may need to report to the emergency room. Patient expressed understanding.  -Discussed possible surgery to straighten hallux hammertoe to prevent future ulceration. Patient will consider after we get full improvement of this wound.   Procedure: Excisional Debridement of Wound Rationale: Removal of non-viable soft tissue from the wound to promote healing.  Anesthesia: none Pre-Debridement Wound Measurements: overlying callus  Post-Debridement Wound Measurements: 0.2 cm x 0.2 cm x 0.1 cm  Type of Debridement: Sharp Excisional Tissue Removed: Non-viable soft tissue Depth of Debridement: subcutaneous tissue. Technique: Sharp excisional debridement to bleeding, viable wound base.  Dressing: Dry, sterile, compression dressing. Disposition: Patient tolerated procedure well. Patient to return in 2 week for follow-up.  No follow-ups on file.   Lorenda Peck, DPM

## 2022-07-08 DIAGNOSIS — M1120 Other chondrocalcinosis, unspecified site: Secondary | ICD-10-CM | POA: Diagnosis not present

## 2022-07-08 DIAGNOSIS — Z6841 Body Mass Index (BMI) 40.0 and over, adult: Secondary | ICD-10-CM | POA: Diagnosis not present

## 2022-07-08 DIAGNOSIS — M1991 Primary osteoarthritis, unspecified site: Secondary | ICD-10-CM | POA: Diagnosis not present

## 2022-07-08 DIAGNOSIS — Z79899 Other long term (current) drug therapy: Secondary | ICD-10-CM | POA: Diagnosis not present

## 2022-07-13 ENCOUNTER — Encounter: Payer: Self-pay | Admitting: Podiatry

## 2022-07-13 ENCOUNTER — Ambulatory Visit (INDEPENDENT_AMBULATORY_CARE_PROVIDER_SITE_OTHER): Payer: Medicare Other | Admitting: Podiatry

## 2022-07-13 DIAGNOSIS — G629 Polyneuropathy, unspecified: Secondary | ICD-10-CM

## 2022-07-13 DIAGNOSIS — L97521 Non-pressure chronic ulcer of other part of left foot limited to breakdown of skin: Secondary | ICD-10-CM | POA: Diagnosis not present

## 2022-07-13 DIAGNOSIS — L84 Corns and callosities: Secondary | ICD-10-CM

## 2022-07-13 NOTE — Progress Notes (Signed)
  Subjective:  Patient ID: Elizabeth Wang, female    DOB: 1946/04/25,   MRN: [id number]  Chief Complaint  Patient presents with   Wound Check    2 weeks for wound check Left foot and callouse onright foot     76 y.o. female presents for follow-up of left hallux wound. Relates it was doing better.  Has been using padding on it.    Has been dressing as instructed her husband helps her with the dresings.  Denies any other pedal complaints. Denies n/v/f/c.   Past Medical History:  Diagnosis Date   Acute pulmonary embolism ([place]) 06/2013   Arthritis    Asthma    years ago    Clotting disorder (HCC)    DVT/PE   Diverticulosis    minor    Dyslipidemia    Esophageal reflux    Hypertension    Hypothyroidism    Numbness    TOES   Obesity    OSA (obstructive sleep apnea)    severe OSA with an AHI of 51/hr and nocturnal hypoxemia with O2 sats at low as 69%.   Paroxysmal atrial fibrillation (HCC)    1 isolated episode in the setting of acute stress with no further episodes.    Pseudogout     Objective:  Physical Exam: Vascular: DP/PT pulses 2/4 bilateral. CFT <3 seconds. Normal hair growth on digits. No edema.  Skin. No lacerations or abrasions bilateral feet. Hyperkeratotic lesion noted to lateral third digit around nail plate and some incurvation of right third digit nail. Left hallux ulcer healed.  No erythema edema or purulence noted.  Musculoskeletal: MMT 5/5 bilateral lower extremities in DF, PF, Inversion and Eversion. Deceased ROM in DF of ankle joint.  Neurological: Sensation intact to light touch.   Assessment:   1. Pre-ulcerative calluses   2. Neuropathy        Plan:  Patient was evaluated and treated and all questions answered. Ulcer left hallux wound- healed.  -Hyperkeratotic tissue debrided without inicdent and underlying wound healed.  -Dressed with offloading pads.  -Off-loading with aperture pads.  Discussed continue offloading as the skin continue to strengthen  in this area.  -Discussed avoiding any pedicures and staying off of this foot as much as possible.  -Discussed glucose control and proper protein-rich diet.  -Discussed if any worsening redness, pain, fever or chills to call or may need to report to the emergency room. Patient expressed understanding.  -Discussed possible surgery to straighten hallux hammertoe to prevent future ulceration.   Follow-up in 4 weeks with Dr. Amalia Hailey to recheck wound healed.     Return in about 4 weeks (around 08/10/2022) for wound check.   Lorenda Peck, DPM

## 2022-07-15 DIAGNOSIS — Z1231 Encounter for screening mammogram for malignant neoplasm of breast: Secondary | ICD-10-CM | POA: Diagnosis not present

## 2022-08-01 DIAGNOSIS — Z23 Encounter for immunization: Secondary | ICD-10-CM | POA: Diagnosis not present

## 2022-08-05 ENCOUNTER — Encounter: Payer: Self-pay | Admitting: Cardiology

## 2022-08-12 ENCOUNTER — Ambulatory Visit (INDEPENDENT_AMBULATORY_CARE_PROVIDER_SITE_OTHER): Payer: Medicare Other | Admitting: Podiatry

## 2022-08-12 DIAGNOSIS — L97522 Non-pressure chronic ulcer of other part of left foot with fat layer exposed: Secondary | ICD-10-CM

## 2022-08-12 NOTE — Progress Notes (Signed)
Chief Complaint  Patient presents with   wound care    Patient is here for follow-up left foot great toe ulcer.   Callouses    Patient is here for left great toe ulcer follow-up, patient also has a callous on great right toe.    HPI: 76 y.o. female presenting today for recurrence of an ulcer to the plantar aspect of the left great toe.  Patient states that she has had recurrent ulcerations to this area over the last several years.  Last visit here in the office with Dr. Blenda Mounts the wound had healed however it immediately returned and she noticed drainage and came in today for follow-up.  Currently she is applying offloading callus pads and applying antibiotic ointment and a Band-Aid.  Past Medical History:  Diagnosis Date   Acute pulmonary embolism ([place]) 06/2013   Arthritis    Asthma    years ago    Clotting disorder (HCC)    DVT/PE   Diverticulosis    minor    Dyslipidemia    Esophageal reflux    Hypertension    Hypothyroidism    Numbness    TOES   Obesity    OSA (obstructive sleep apnea)    severe OSA with an AHI of 51/hr and nocturnal hypoxemia with O2 sats at low as 69%.   Paroxysmal atrial fibrillation (HCC)    1 isolated episode in the setting of acute stress with no further episodes.    Pseudogout     Past Surgical History:  Procedure Laterality Date   BREAST RECONSTRUCTION     CARDIOVERSION N/A 06/27/2020   Procedure: CARDIOVERSION;  Surgeon: [name], MD;  Location: [facility];  Service: Cardiovascular;  Laterality: N/A;   CESAREAN SECTION     COLONOSCOPY  2004   repeat in ten years   KNEE ARTHROSCOPY Left    TONSILLECTOMY     TOTAL KNEE ARTHROPLASTY Right 01/01/2014   Procedure: TOTAL RIGHT KNEE ARTHROPLASTY;  Surgeon: [name], MD;  Location: [facility];  Service: Orthopedics;  Laterality: Right;   TOTAL KNEE ARTHROPLASTY Left 07/23/2014   Procedure: LEFT TOTAL KNEE ARTHROPLASTY;  Surgeon: [name], MD;  Location: [facility];  Service: Orthopedics;   Laterality: Left;   tummy tuck  1987   WISDOM TOOTH EXTRACTION      Allergies  Allergen Reactions   Nsaids Other (See Comments)    significantly raises BP   Bee Venom Swelling   Other     Artificial sweeteners cause dizziness and altered mental state   Codeine Nausea Only     Physical Exam: General: The patient is alert and oriented x3 in no acute distress.  Dermatology: Ulcer noted to the distal tip of the left great toe measuring approximately 0.3x0.3x0.1 cm.  To the noted ulceration there is no eschar.  There is a moderate amount of slough fibrin and necrotic tissue noted.  Granulation tissue and wound base is red.  Scant serous drainage noted.  Periwound is callused.  Vascular: Palpable pedal pulses bilaterally. Capillary refill within normal limits.  Negative for any significant edema or erythema  Neurological: Light touch and protective threshold grossly intact  Musculoskeletal Exam: Mallet toe deformity noted contributory to the ulcer development as well as hammertoes to the lesser digits  Assessment: 1.  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 Patient evaluated.  2.  Medically  necessary excisional debridement including subcutaneous tissue was performed using a tissue nipper.  Excisional debridement of the necrotic nonviable tissue down to healthier bleeding viable tissue was performed with postdebridement measurement same as pre- 3.  Continue antibiotic ointment and a light dressing daily 4.  Offloading felt dancers pads were applied to the insoles of the patient's shoes to offload pressure from the great toe 5.  Return to clinic 3 weeks     Edrick Kins, DPM [facility]  Dr. Edrick Kins, DPM    [address], [place] [address]                Office [phone]  Fax [phone]

## 2022-08-26 DIAGNOSIS — I350 Nonrheumatic aortic (valve) stenosis: Secondary | ICD-10-CM

## 2022-08-26 HISTORY — DX: Nonrheumatic aortic (valve) stenosis: I35.0

## 2022-08-31 NOTE — Progress Notes (Unsigned)
Cardiology Office Note:    Date:  08/31/2022   ID:  Elizabeth Wang, DOB Apr 22, 1946, MRN [id number]  PCP:  Ginger Organ., MD   [facility] Providers Cardiologist:  [name], MD Electrophysiologist:  Vickie Epley, MD  Sleep Medicine:  Fransico Him, MD  {  Referring MD: Ginger Organ., MD    History of Present Illness:    Elizabeth Wang is a 76 y.o. female with a hx of paroxysmal Wang, Elizabeth Wang, Elizabeth leg DVT and PE 06/2013 who was previously followed by Dr. Meda Coffee who now presents to clinic for follow-up.  Per review of the record, patient has history of isolated Wang in 06/2013 but recurred after receiving COVID vaccine and she was placed on xarelto. Has been followed by Dr. [name] and currently pursuing a rate control strategy at this time. Dilt recently increased. If fails rate control and has persistent symptoms, may proceed with ablation. Recent TTE 06/2020 with normal LVEF 60%, moderate RA and LA dilation.   Was seen in clinic by Dr. [name] on 04/2021 where she was doing well with rate control strategy. Was also recommended to ensure her BiPAP device was working appropriately. Had follow-up with Oda Kilts on 01/2022 where she was doing well.   Was last seen in clinic on 03/02/22 where she was stable form a CV standpoint. Was trying to lose weight. Tolerating xarelto without issues.  Today, ***   Past Medical History:  Diagnosis Date   Acute pulmonary embolism ([place]) 06/2013   Arthritis    Asthma    years ago    Clotting disorder (HCC)    DVT/PE   Diverticulosis    minor    Dyslipidemia    Esophageal reflux    Hypertension    Hypothyroidism    Numbness    TOES   Obesity    OSA (obstructive sleep apnea)    severe OSA with an AHI of 51/hr and nocturnal hypoxemia with O2 sats at low as 69%.   Paroxysmal atrial fibrillation (HCC)    1 isolated episode in the setting of acute stress with no further episodes.    Pseudogout     Past Surgical History:   Procedure Laterality Date   BREAST RECONSTRUCTION     CARDIOVERSION N/A 06/27/2020   Procedure: CARDIOVERSION;  Surgeon: [name], MD;  Location: [facility];  Service: Cardiovascular;  Laterality: N/A;   CESAREAN SECTION     COLONOSCOPY  2004   repeat in ten years   KNEE ARTHROSCOPY Left    TONSILLECTOMY     TOTAL KNEE ARTHROPLASTY Elizabeth 01/01/2014   Procedure: TOTAL Elizabeth KNEE ARTHROPLASTY;  Surgeon: [name], MD;  Location: [facility];  Service: Orthopedics;  Laterality: Elizabeth;   TOTAL KNEE ARTHROPLASTY Left 07/23/2014   Procedure: LEFT TOTAL KNEE ARTHROPLASTY;  Surgeon: [name], MD;  Location: [facility];  Service: Orthopedics;  Laterality: Left;   tummy tuck  1987   WISDOM TOOTH EXTRACTION      Current Medications: No outpatient medications have been marked as taking for the 09/02/22 encounter (Appointment) with Freada Bergeron, MD.     Allergies:   Nsaids, Bee venom, Other, and Codeine   Social History   Socioeconomic History   Marital status: Married    Spouse name: Not on file   Number of children: Not on file   Years of education: Not on file   Highest education level: Not on file  Occupational History  Occupation: Music therapist  Tobacco Use   Smoking status: Former    Packs/day: 0.25    Years: 12.00    Total pack years: 3.00    Types: Cigarettes    Quit date: 12/28/1975    Years since quitting: 46.7   Smokeless tobacco: Never  Substance and Sexual Activity   Alcohol use: Yes    Alcohol/week: 4.0 standard drinks of alcohol    Types: 4 Glasses of wine per week    Comment: socially   Drug use: No   Sexual activity: Not on file  Other Topics Concern   Not on file  Social History Narrative   Lives in [place] with her husband. Exercises at the gym 4 times a week.   Social Determinants of Health   Financial Resource Strain: Not on file  Food Insecurity: Not on file  Transportation Needs: Not on file  Physical Activity: Not on file   Stress: Not on file  Social Connections: Not on file     Family History: The patient's family history includes CVA in her mother; Coronary artery disease in her father; Heart disease in her father; Hypertension in her father.  ROS:   Review of Systems  Constitutional:  Positive for malaise/fatigue. Negative for fever.  HENT:  Negative for congestion, nosebleeds and sore throat.   Eyes:  Negative for pain and discharge.  Respiratory:  Negative for cough, sputum production and wheezing.   Cardiovascular:  Positive for leg swelling. Negative for chest pain, palpitations, orthopnea, claudication and PND.  Gastrointestinal:  Negative for blood in stool and diarrhea.  Genitourinary:  Negative for hematuria and urgency.  Musculoskeletal:  Positive for joint pain and myalgias. Negative for falls.  Neurological:  Negative for tremors and headaches.  Endo/Heme/Allergies:  Negative for environmental allergies.  Psychiatric/Behavioral:  Negative for depression and memory loss.      EKGs/Labs/Other Studies Reviewed:    The following studies were reviewed today:  TTE: 07/19/2020  1. Left ventricular ejection fraction, by estimation, is 60 to 65%. The  left ventricle has normal function. The left ventricle has no regional  wall motion abnormalities. There is mild left ventricular hypertrophy.  Left ventricular diastolic parameters  are indeterminate.   2. Elizabeth ventricular systolic function is normal. The Elizabeth ventricular  size is normal.   3. Left atrial size was moderately dilated.   4. Elizabeth atrial size was moderately dilated.   5. The mitral valve is normal in structure. No evidence of mitral valve  regurgitation. No evidence of mitral stenosis.   6. The aortic valve is tricuspid. Aortic valve regurgitation is not  visualized. Mild aortic valve stenosis. Aortic valve mean gradient  measures 13.0 mmHg.   7. Aortic dilatation noted. There is mild dilatation of the ascending  aorta,  measuring 37 mm.   8. The inferior vena cava is normal in size with greater than 50%  respiratory variability, suggesting Elizabeth atrial pressure of 3 mmHg.   9. The patient is in atrial fibrillation.   EKG:  EKG is personally reviewed. 03/02/2022: EKG was not ordered. 02/19/2022 Barrington Ellison, PA-C): Atrial fibrillation at 67 bpm. 05/06/2021: EKG was not ordered.   Recent Labs: 02/19/2022: BUN 20; Creatinine, Ser 0.98; Hemoglobin 13.1; Platelets 190; Potassium 4.0; Sodium 137   Recent Lipid Panel    Component Value Date/Time   CHOL 138 12/01/2019 1529   TRIG 168 (H) 12/01/2019 1529   HDL 46 12/01/2019 1529   CHOLHDL 3.0 12/01/2019 1529   LDLCALC 64  12/01/2019 1529     Risk Assessment/Calculations:    CHA2DS2-VASc Score =    This indicates a  % annual risk of stroke. The patient's score is based upon:           Physical Exam:    VS:  LMP  (LMP Unknown)     Wt Readings from Last 3 Encounters:  03/02/22 228 lb 12.8 oz (103.8 kg)  02/19/22 231 lb (104.8 kg)  05/23/21 218 lb 12.8 oz (99.2 kg)     GEN: Well nourished, well developed in no acute distress HEENT: Normal NECK: No JVD; No carotid bruits CARDIAC: Irregularly irregular, 2/6 systolic murmur. No rubs, gallops RESPIRATORY:  Clear to auscultation without rales, wheezing or rhonchi  ABDOMEN: Soft, non-tender, non-distended MUSCULOSKELETAL:  1+ left ankle edema; No deformity  SKIN: Warm and dry NEUROLOGIC:  Alert and oriented x 3 PSYCHIATRIC:  Normal affect   ASSESSMENT:    No diagnosis found.  PLAN:    In order of problems listed above:  #Persistent Wang: #Secondary Hypercoaguable Disorder: CHADs-vasc 4. On xarelto for [facility]. Followed by Dr. [name]. Plan for rate control strategy for now with consideration of ablation if symptoms worsen. TTE with normal LVEF, moderately dilated RA/LA.  -Continue dilt '360mg'$  daily -Continue xarelto '20mg'$  daily -Follow-up with Dr. [name] as scheduled  #Elizabeth Wang: Blood  pressure well controlled <120/80s. No orthostatic symptoms.  -Continue telmisartan '80mg'$  daily -Continue HCTZ '25mg'$  daily -Continue dilt '360mg'$  daily  #HLD: -Continue crestor '10mg'$  daily -Lipids at goal with goal LDL 63  -Lipids followed by PCP  #OSA: -Continue BiPAP  #Obesity: BMI 38 Working hard on weight loss. Currently on Noom diet plan. Hoping to resume swimming and water aerobics once her sciatica improves -Continue Noom diet -Resume exercise when able with goal >146mn per week  Exercise recommendations: Goal of exercising for at least 30 minutes a day, at least 5 times per week.  Please exercise to a moderate exertion.  This means that while exercising it is difficult to speak in full sentences, however you are not so short of breath that you feel you must stop, and not so comfortable that you can carry on a full conversation.  Exertion level should be approximately a 5/10, if 10 is the most exertion you can perform.  Diet recommendations: Recommend a heart healthy diet such as the Mediterranean diet.  This diet consists of plant based foods, healthy fats, lean meats, olive oil.  It suggests limiting the intake of simple carbohydrates such as white breads, pastries, and pastas.  It also limits the amount of red meat, wine, and dairy products such as cheese that one should consume on a daily basis.       Follow-up in 6 months.  Medication Adjustments/Labs and Tests Ordered: Current medicines are reviewed at length with the patient today.  Concerns regarding medicines are outlined above.   No orders of the defined types were placed in this encounter.  No orders of the defined types were placed in this encounter.  There are no Patient Instructions on file for this visit.  I,Mathew Stumpf,acting as a sEducation administratorfor HFreada Bergeron MD.,have documented all relevant documentation on the behalf of HFreada Bergeron MD,as directed by  HFreada Bergeron MD while in the presence  of HFreada Bergeron MD.  I, HFreada Bergeron MD, have reviewed all documentation for this visit. The documentation on 08/31/22 for the exam, diagnosis, procedures, and orders are all accurate and complete.   Signed,  Freada Bergeron, MD  08/31/2022 8:33 PM    [place]

## 2022-09-01 DIAGNOSIS — S92514A Nondisplaced fracture of proximal phalanx of right lesser toe(s), initial encounter for closed fracture: Secondary | ICD-10-CM | POA: Diagnosis not present

## 2022-09-01 DIAGNOSIS — S92355A Nondisplaced fracture of fifth metatarsal bone, left foot, initial encounter for closed fracture: Secondary | ICD-10-CM | POA: Diagnosis not present

## 2022-09-01 DIAGNOSIS — S92511A Displaced fracture of proximal phalanx of right lesser toe(s), initial encounter for closed fracture: Secondary | ICD-10-CM | POA: Insufficient documentation

## 2022-09-01 DIAGNOSIS — M19072 Primary osteoarthritis, left ankle and foot: Secondary | ICD-10-CM | POA: Diagnosis not present

## 2022-09-01 DIAGNOSIS — M19079 Primary osteoarthritis, unspecified ankle and foot: Secondary | ICD-10-CM | POA: Insufficient documentation

## 2022-09-02 ENCOUNTER — Ambulatory Visit (INDEPENDENT_AMBULATORY_CARE_PROVIDER_SITE_OTHER): Payer: Medicare Other | Admitting: Podiatry

## 2022-09-02 ENCOUNTER — Encounter: Payer: Self-pay | Admitting: Cardiology

## 2022-09-02 ENCOUNTER — Ambulatory Visit: Payer: Medicare Other | Attending: Cardiology | Admitting: Cardiology

## 2022-09-02 VITALS — BP 124/84 | HR 90 | Ht 63.5 in | Wt 230.8 lb

## 2022-09-02 DIAGNOSIS — E782 Mixed hyperlipidemia: Secondary | ICD-10-CM | POA: Insufficient documentation

## 2022-09-02 DIAGNOSIS — I350 Nonrheumatic aortic (valve) stenosis: Secondary | ICD-10-CM | POA: Diagnosis not present

## 2022-09-02 DIAGNOSIS — I4819 Other persistent atrial fibrillation: Secondary | ICD-10-CM | POA: Diagnosis not present

## 2022-09-02 DIAGNOSIS — L97522 Non-pressure chronic ulcer of other part of left foot with fat layer exposed: Secondary | ICD-10-CM

## 2022-09-02 DIAGNOSIS — G4733 Obstructive sleep apnea (adult) (pediatric): Secondary | ICD-10-CM | POA: Diagnosis not present

## 2022-09-02 DIAGNOSIS — D6869 Other thrombophilia: Secondary | ICD-10-CM | POA: Insufficient documentation

## 2022-09-02 DIAGNOSIS — I10 Essential (primary) hypertension: Secondary | ICD-10-CM | POA: Insufficient documentation

## 2022-09-02 NOTE — Progress Notes (Signed)
Cardiology Office Note:    Date:  09/02/2022   ID:  Elizabeth Wang, DOB August 04, 1946, MRN [id number]  PCP:  Ginger Organ., MD   [facility] Providers Cardiologist:  [name], MD Electrophysiologist:  Vickie Epley, MD  Sleep Medicine:  Fransico Him, MD  { Referring MD: Ginger Organ., MD    History of Present Illness:    Elizabeth Wang is a 76 y.o. female with a hx of paroxysmal Afib, HTN, right leg DVT and PE 06/2013 who was previously followed by Dr. Meda Coffee who now presents to clinic for follow-up.  Per review of the record, patient has history of isolated Afib in 06/2013 but recurred after receiving COVID vaccine and she was placed on xarelto. Has been followed by Dr. [name] and currently pursuing a rate control strategy at this time. Dilt recently increased. If fails rate control and has persistent symptoms, may proceed with ablation. Recent TTE 06/2020 with normal LVEF 60%, moderate RA and LA dilation.   Was seen in clinic by Dr. [name] on 04/2021 where she was doing well with rate control strategy. Was also recommended to ensure her BiPAP device was working appropriately. Had follow-up with Oda Kilts on 01/2022 where she was doing well.   Was last seen in clinic on 03/02/22 where she was stable form a CV standpoint. Was trying to lose weight. Tolerating xarelto without issues.  Today, the patient states that she is doing well from a cardiovascular perspective. She remains in Afib today per her EKG, but she is asymptomatic. No bleeding issues on Xarelto.  Her main complaints today concern ongoing issues with her toe and ankle. She has a recurring ulceration of her left great toe, and she endorses suffering a fracture of her left fifth metatarsal. She continues to follow up with her podiatrist.  She confirms that she is able to walk. However, due to her above issues and ankle concerns she notes that it looks like she is in pain due to gait instability, but she is  not feeling pain. Usually she works with PT for 30 minutes twice a week. Soon she will see another physical therapist who performs dry needling.  For a time she was not exercising at the gym. This week she has started returning to her exercise routines. No anginal symptoms.   She denies any palpitations, chest pain, or shortness of breath. No lightheadedness, headaches, syncope, orthopnea, or PND.   Past Medical History:  Diagnosis Date   Acute pulmonary embolism ([place]) 06/2013   Arthritis    Asthma    years ago    Clotting disorder (HCC)    DVT/PE   Diverticulosis    minor    Dyslipidemia    Esophageal reflux    Hypertension    Hypothyroidism    Numbness    TOES   Obesity    OSA (obstructive sleep apnea)    severe OSA with an AHI of 51/hr and nocturnal hypoxemia with O2 sats at low as 69%.   Paroxysmal atrial fibrillation (HCC)    1 isolated episode in the setting of acute stress with no further episodes.    Pseudogout     Past Surgical History:  Procedure Laterality Date   BREAST RECONSTRUCTION     CARDIOVERSION N/A 06/27/2020   Procedure: CARDIOVERSION;  Surgeon: [name], MD;  Location: [facility] ENDOSCOPY;  Service: Cardiovascular;  Laterality: N/A;   CESAREAN SECTION     COLONOSCOPY  2004   repeat in  ten years   KNEE ARTHROSCOPY Left    TONSILLECTOMY     TOTAL KNEE ARTHROPLASTY Right 01/01/2014   Procedure: TOTAL RIGHT KNEE ARTHROPLASTY;  Surgeon: [name], MD;  Location: [facility] ORS;  Service: Orthopedics;  Laterality: Right;   TOTAL KNEE ARTHROPLASTY Left 07/23/2014   Procedure: LEFT TOTAL KNEE ARTHROPLASTY;  Surgeon: [name], MD;  Location: [facility] ORS;  Service: Orthopedics;  Laterality: Left;   tummy tuck  1987   WISDOM TOOTH EXTRACTION      Current Medications: Current Meds  Medication Sig   acetaminophen (TYLENOL) 500 MG tablet Take 1,000 mg by mouth every 8 (eight) hours as needed for moderate pain or headache.   ASPERCREME LIDOCAINE EX Apply 1  application topically daily as needed (arthritis pain).   cetirizine (ZYRTEC) 10 MG tablet Take 10 mg by mouth daily as needed for allergies.    colchicine 0.6 MG tablet Take 0.6 mg by mouth at bedtime. For gout   COVID-19 mRNA bivalent vaccine, Pfizer, (PFIZER COVID-19 VAC BIVALENT) injection Inject into the muscle.   COVID-19 mRNA bivalent vaccine, Pfizer, (PFIZER COVID-19 VAC BIVALENT) injection Inject into the muscle.   COVID-19 mRNA vaccine, Moderna, 100 MCG/0.5ML injection Inject into the muscle.   diltiazem (CARDIZEM CD) 360 MG 24 hr capsule Take 1 capsule (360 mg total) by mouth daily.   EPINEPHrine 0.3 mg/0.3 mL IJ SOAJ injection Inject 0.3 mg into the muscle as needed for anaphylaxis (Allergic Reaction).    fluticasone (FLONASE) 50 MCG/ACT nasal spray Place 1-2 sprays into both nostrils daily as needed for allergies.    gabapentin (NEURONTIN) 300 MG capsule Take 300 mg by mouth in the morning, at noon, and at bedtime.   hydrochlorothiazide (HYDRODIURIL) 25 MG tablet Take 1 tablet (25 mg total) by mouth daily.   levofloxacin (LEVAQUIN) 500 MG tablet    levothyroxine (SYNTHROID, LEVOTHROID) 125 MCG tablet Take 125 mcg by mouth daily before breakfast. For thyroid therapy   ondansetron (ZOFRAN-ODT) 4 MG disintegrating tablet Take 4 mg by mouth 3 (three) times daily as needed (nausea/vomiting). For N&V   rosuvastatin (CRESTOR) 10 MG tablet Take 1 tablet (10 mg total) by mouth daily.   telmisartan (MICARDIS) 80 MG tablet Take 80 mg by mouth daily.   valACYclovir (VALTREX) 1000 MG tablet Take 1,000 mg by mouth daily as needed (outbreaks).    XARELTO 20 MG TABS tablet TAKE ONE TABLET DAILY WITH SUPPER   Zoster Vaccine Adjuvanted [facility]) injection Inject into the muscle.     Allergies:   Nsaids, Bee venom, Other, and Codeine   Social History   Socioeconomic History   Marital status: Married    Spouse name: Not on file   Number of children: Not on file   Years of education: Not on  file   Highest education level: Not on file  Occupational History   Occupation: Music therapist  Tobacco Use   Smoking status: Former    Packs/day: 0.25    Years: 12.00    Total pack years: 3.00    Types: Cigarettes    Quit date: 12/28/1975    Years since quitting: 46.7   Smokeless tobacco: Never  Substance and Sexual Activity   Alcohol use: Yes    Alcohol/week: 4.0 standard drinks of alcohol    Types: 4 Glasses of wine per week    Comment: socially   Drug use: No   Sexual activity: Not on file  Other Topics Concern   Not on file  Social  History Narrative   Lives in [place] with her husband. Exercises at the gym 4 times a week.   Social Determinants of Health   Financial Resource Strain: Not on file  Food Insecurity: Not on file  Transportation Needs: Not on file  Physical Activity: Not on file  Stress: Not on file  Social Connections: Not on file     Family History: The patient's family history includes CVA in her mother; Coronary artery disease in her father; Heart disease in her father; Hypertension in her father.  ROS:   Review of Systems  Constitutional:  Positive for malaise/fatigue. Negative for fever.  HENT:  Negative for congestion, nosebleeds and sore throat.   Eyes:  Negative for pain and discharge.  Respiratory:  Negative for cough, sputum production and wheezing.   Cardiovascular:  Positive for leg swelling. Negative for chest pain, palpitations, orthopnea, claudication and PND.  Gastrointestinal:  Negative for blood in stool and diarrhea.  Genitourinary:  Negative for hematuria and urgency.  Musculoskeletal:  Positive for joint pain and myalgias. Negative for falls.  Neurological:  Negative for tremors and headaches.  Endo/Heme/Allergies:  Negative for environmental allergies.  Psychiatric/Behavioral:  Negative for depression and memory loss.      EKGs/Labs/Other Studies Reviewed:    The following studies were reviewed today:  TTE:  07/19/2020  1. Left ventricular ejection fraction, by estimation, is 60 to 65%. The  left ventricle has normal function. The left ventricle has no regional  wall motion abnormalities. There is mild left ventricular hypertrophy.  Left ventricular diastolic parameters  are indeterminate.   2. Right ventricular systolic function is normal. The right ventricular  size is normal.   3. Left atrial size was moderately dilated.   4. Right atrial size was moderately dilated.   5. The mitral valve is normal in structure. No evidence of mitral valve  regurgitation. No evidence of mitral stenosis.   6. The aortic valve is tricuspid. Aortic valve regurgitation is not  visualized. Mild aortic valve stenosis. Aortic valve mean gradient  measures 13.0 mmHg.   7. Aortic dilatation noted. There is mild dilatation of the ascending  aorta, measuring 37 mm.   8. The inferior vena cava is normal in size with greater than 50%  respiratory variability, suggesting right atrial pressure of 3 mmHg.   9. The patient is in atrial fibrillation.   EKG:  EKG is personally reviewed. 09/02/2022:  Atrial fibrillation. LAD. Rate 90 bpm. 03/02/2022: EKG was not ordered. 02/19/2022 Barrington Ellison, PA-C): Atrial fibrillation at 67 bpm. 05/06/2021: EKG was not ordered.   Recent Labs: 02/19/2022: BUN 20; Creatinine, Ser 0.98; Hemoglobin 13.1; Platelets 190; Potassium 4.0; Sodium 137   Recent Lipid Panel    Component Value Date/Time   CHOL 138 12/01/2019 1529   TRIG 168 (H) 12/01/2019 1529   HDL 46 12/01/2019 1529   CHOLHDL 3.0 12/01/2019 1529   LDLCALC 64 12/01/2019 1529     Risk Assessment/Calculations:    CHA2DS2-VASc Score =    This indicates a  % annual risk of stroke. The patient's score is based upon:           Physical Exam:    VS:  BP 124/84   Pulse 90   Ht 5' 3.5" (1.613 m)   Wt 230 lb 12.8 oz (104.7 kg)   LMP  (LMP Unknown)   SpO2 97%   BMI 40.24 kg/m     Wt Readings from Last 3  Encounters:  09/02/22 230 lb  12.8 oz (104.7 kg)  03/02/22 228 lb 12.8 oz (103.8 kg)  02/19/22 231 lb (104.8 kg)     GEN: Well nourished, well developed in no acute distress HEENT: Normal NECK: No JVD; No carotid bruits CARDIAC: Irregularly irregular, 2/6 systolic murmur. No rubs, gallops RESPIRATORY:  Clear to auscultation without rales, wheezing or rhonchi  ABDOMEN: Soft, non-tender, non-distended MUSCULOSKELETAL:  1+ left ankle edema, trace right ankle edema; No deformity  SKIN: Warm and dry NEUROLOGIC:  Alert and oriented x 3 PSYCHIATRIC:  Normal affect   ASSESSMENT:    1. Persistent atrial fibrillation ([place])   2. Mild aortic stenosis   3. Primary hypertension   4. Secondary hypercoagulability disorder ([place])   5. Mixed hyperlipidemia   6. OSA (obstructive sleep apnea)   7. Morbid obesity ([place])     PLAN:    In order of problems listed above:  #Persistent Afib: #Secondary Hypercoaguable Disorder: CHADs-vasc 4. On xarelto for [facility]. Followed by Dr. [name]. Plan for rate control strategy for now with consideration of ablation if symptoms worsen. TTE with normal LVEF, moderately dilated RA/LA.  -Continue dilt '360mg'$  daily -Continue xarelto '20mg'$  daily -Follow-up with Dr. [name] as scheduled  #HTN: Blood pressure well controlled <130/90s. No orthostatic symptoms.  -Continue telmisartan '80mg'$  daily -Continue HCTZ '25mg'$  daily -Continue dilt '360mg'$  daily  #Mild Aortic Stenosis: Noted on TTE in 06/2020 with mean gradient 42mHg.  -Check TTE for monitoring  #HLD: #Coronary Artery Ca on CT chest: -Continue crestor '10mg'$  daily -Lipids followed by PCP -Goal LDL<70 due to coronary Ca noted on CT chest in 2014  #OSA: -Continue BiPAP  #Obesity: BMI 40 Working hard on weight loss. Currently on Noom diet plan. Hoping to resume swimming and water aerobics once her sciatica improves -Continue Noom diet -Resume exercise when able with goal >1541m per week  Exercise  recommendations: Goal of exercising for at least 30 minutes a day, at least 5 times per week.  Please exercise to a moderate exertion.  This means that while exercising it is difficult to speak in full sentences, however you are not so short of breath that you feel you must stop, and not so comfortable that you can carry on a full conversation.  Exertion level should be approximately a 5/10, if 10 is the most exertion you can perform.  Diet recommendations: Recommend a heart healthy diet such as the Mediterranean diet.  This diet consists of plant based foods, healthy fats, lean meats, olive oil.  It suggests limiting the intake of simple carbohydrates such as white breads, pastries, and pastas.  It also limits the amount of red meat, wine, and dairy products such as cheese that one should consume on a daily basis.       Follow-up in 6 months.  Medication Adjustments/Labs and Tests Ordered: Current medicines are reviewed at length with the patient today.  Concerns regarding medicines are outlined above.   Orders Placed This Encounter  Procedures   EKG 12-Lead   ECHOCARDIOGRAM COMPLETE   No orders of the defined types were placed in this encounter.  Patient Instructions  Medication Instructions:   Your physician recommends that you continue on your current medications as directed. Please refer to the Current Medication list given to you today.  *If you need a refill on your cardiac medications before your next appointment, please call your pharmacy*    Testing/Procedures:  Your physician has requested that you have an echocardiogram. Echocardiography is a painless test that uses sound waves to  create images of your heart. It provides your doctor with information about the size and shape of your heart and how well your heart's chambers and valves are working. This procedure takes approximately one hour. There are no restrictions for this procedure. Please do NOT wear cologne, perfume,  aftershave, or lotions (deodorant is allowed). Please arrive 15 minutes prior to your appointment time.    Follow-Up: At [facility], you and your health needs are our priority.  As part of our continuing mission to provide you with exceptional heart care, we have created designated Provider Care Teams.  These Care Teams include your primary Cardiologist (physician) and Advanced Practice Providers (APPs -  Physician Assistants and Nurse Practitioners) who all work together to provide you with the care you need, when you need it.  We recommend signing up for the patient portal called "MyChart".  Sign up information is provided on this After Visit Summary.  MyChart is used to connect with patients for Virtual Visits (Telemedicine).  Patients are able to view lab/test results, encounter notes, upcoming appointments, etc.  Non-urgent messages can be sent to your provider as well.   To learn more about what you can do with MyChart, go to [email or web address].    Your next appointment:   6 month(s)  The format for your next appointment:   In Person  Provider:   Dr. Johney Frame   Important Information About Sugar        I,Mathew Stumpf,acting as a scribe for Freada Bergeron, MD.,have documented all relevant documentation on the behalf of Freada Bergeron, MD,as directed by  Freada Bergeron, MD while in the presence of Freada Bergeron, MD.  I, Freada Bergeron, MD, have reviewed all documentation for this visit. The documentation on 09/02/22 for the exam, diagnosis, procedures, and orders are all accurate and complete.   Signed, Freada Bergeron, MD  09/02/2022 9:44 AM    [place]

## 2022-09-02 NOTE — Patient Instructions (Signed)
Medication Instructions:   Your physician recommends that you continue on your current medications as directed. Please refer to the Current Medication list given to you today.  *If you need a refill on your cardiac medications before your next appointment, please call your pharmacy*    Testing/Procedures:  Your physician has requested that you have an echocardiogram. Echocardiography is a painless test that uses sound waves to create images of your heart. It provides your doctor with information about the size and shape of your heart and how well your heart's chambers and valves are working. This procedure takes approximately one hour. There are no restrictions for this procedure. Please do NOT wear cologne, perfume, aftershave, or lotions (deodorant is allowed). Please arrive 15 minutes prior to your appointment time.    Follow-Up: At [facility], you and your health needs are our priority.  As part of our continuing mission to provide you with exceptional heart care, we have created designated Provider Care Teams.  These Care Teams include your primary Cardiologist (physician) and Advanced Practice Providers (APPs -  Physician Assistants and Nurse Practitioners) who all work together to provide you with the care you need, when you need it.  We recommend signing up for the patient portal called "MyChart".  Sign up information is provided on this After Visit Summary.  MyChart is used to connect with patients for Virtual Visits (Telemedicine).  Patients are able to view lab/test results, encounter notes, upcoming appointments, etc.  Non-urgent messages can be sent to your provider as well.   To learn more about what you can do with MyChart, go to [email or web address].    Your next appointment:   6 month(s)  The format for your next appointment:   In Person  Provider:   Dr. Johney Frame   Important Information About Sugar

## 2022-09-02 NOTE — Progress Notes (Signed)
No chief complaint on file.   HPI: 76 y.o. female presenting today for recurrence of an ulcer to the plantar aspect of the left great toe.  Patient states that overall there is significant improvement.  She says that her PCP referred her to [facility] for ankle and foot reconstruction.  She is waiting for that referral.  Overall she says the toe is doing much better.  No new complaints at this time  Past Medical History:  Diagnosis Date   Acute pulmonary embolism ([place]) 06/2013   Arthritis    Asthma    years ago    Clotting disorder (HCC)    DVT/PE   Diverticulosis    minor    Dyslipidemia    Esophageal reflux    Hypertension    Hypothyroidism    Numbness    TOES   Obesity    OSA (obstructive sleep apnea)    severe OSA with an AHI of 51/hr and nocturnal hypoxemia with O2 sats at low as 69%.   Paroxysmal atrial fibrillation (HCC)    1 isolated episode in the setting of acute stress with no further episodes.    Pseudogout     Past Surgical History:  Procedure Laterality Date   BREAST RECONSTRUCTION     CARDIOVERSION N/A 06/27/2020   Procedure: CARDIOVERSION;  Surgeon: [name], MD;  Location: [facility] ENDOSCOPY;  Service: Cardiovascular;  Laterality: N/A;   CESAREAN SECTION     COLONOSCOPY  2004   repeat in ten years   KNEE ARTHROSCOPY Left    TONSILLECTOMY     TOTAL KNEE ARTHROPLASTY Right 01/01/2014   Procedure: TOTAL RIGHT KNEE ARTHROPLASTY;  Surgeon: [name], MD;  Location: [facility] ORS;  Service: Orthopedics;  Laterality: Right;   TOTAL KNEE ARTHROPLASTY Left 07/23/2014   Procedure: LEFT TOTAL KNEE ARTHROPLASTY;  Surgeon: [name], MD;  Location: [facility] ORS;  Service: Orthopedics;  Laterality: Left;   tummy tuck  1987   WISDOM TOOTH EXTRACTION      Allergies  Allergen Reactions   Nsaids Other (See Comments)    significantly raises BP   Bee Venom Swelling   Other     Artificial sweeteners cause dizziness and altered mental state   Codeine Nausea Only      Physical Exam: General: The patient is alert and oriented x3 in no acute distress.  Dermatology: The ulcer to the distal tip of the left great toe has improved significantly.  It appears very dry and stable.  No erythema or edema.  Measures approximately 0.3 x 0.3 x 0.1 cm.  No drainage noted.  Vascular: Palpable pedal pulses bilaterally. Capillary refill within normal limits.  Negative for any significant edema or erythema  Neurological: Light touch and protective threshold grossly intact  Musculoskeletal Exam: Mallet toe deformity noted contributory to the ulcer development as well as hammertoes to the lesser digits  Assessment: 1.  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 Patient evaluated.  Patient states that she is being referred to [facility] for ankle and foot reconstruction.  Referral is pending by her PCP. 2.  Medically necessary excisional debridement including subcutaneous tissue was performed using a tissue nipper.  Excisional debridement of the necrotic nonviable tissue down to healthier bleeding viable tissue was performed with postdebridement measurement same as pre- 3.  Continue antibiotic ointment and a light dressing daily 4.  Continue  offloading felt dancers pads that were applied to the insoles of the patient's shoes to offload pressure from the great toe 5.  Return to clinic 3 weeks     Edrick Kins, DPM [facility]  Dr. Edrick Kins, DPM    [address], [place] [address]                Office [phone]  Fax [phone]

## 2022-09-11 ENCOUNTER — Other Ambulatory Visit (HOSPITAL_BASED_OUTPATIENT_CLINIC_OR_DEPARTMENT_OTHER): Payer: Self-pay

## 2022-09-11 DIAGNOSIS — Z23 Encounter for immunization: Secondary | ICD-10-CM | POA: Diagnosis not present

## 2022-09-11 MED ORDER — COMIRNATY 30 MCG/0.3ML IM SUSY
PREFILLED_SYRINGE | INTRAMUSCULAR | 0 refills | Status: AC
  Filled 2022-09-11: qty 0.3, 1d supply, fill #0

## 2022-09-14 ENCOUNTER — Ambulatory Visit (INDEPENDENT_AMBULATORY_CARE_PROVIDER_SITE_OTHER): Payer: Medicare Other | Admitting: Podiatry

## 2022-09-14 DIAGNOSIS — L97522 Non-pressure chronic ulcer of other part of left foot with fat layer exposed: Secondary | ICD-10-CM | POA: Diagnosis not present

## 2022-09-14 NOTE — Progress Notes (Signed)
  Subjective:  Patient ID: Elizabeth Wang, female    DOB: 1946-06-17,  MRN: [id number]  Chief Complaint  Patient presents with   Wound Check    Left great toe ulcer     76 y.o. female presents with the above complaint. History confirmed with patient.  She presents today for ongoing wound care of her left hallux she is known to Dr. Blenda Mounts and Dr. Amalia Hailey  Objective:  Physical Exam: warm, good capillary refill, no trophic changes or ulcerative lesions, normal DP and PT pulses, normal sensory exam, and full-thickness ulceration left hallux measuring 5 mm x 2 mm x 1 mm.  No drainage or signs of infection overlying hyperkeratosis     Assessment:   1. Ulcer of great toe, left, with fat layer exposed ([place])      Plan:  Patient was evaluated and treated and all questions answered.  Ulcer left hallux -We discussed the etiology and factors that are a part of the wound healing process.  We also discussed the risk of infection both soft tissue and osteomyelitis from open ulceration.  Discussed the risk of limb loss if this happens or worsens. -Debridement as below. -Dressed with Silvadene, DSD. -Continue home dressing changes daily with bandage and antibiotic ointment   Procedure: Excisional Debridement of Wound Rationale: Removal of non-viable soft tissue from the wound to promote healing.  Anesthesia: none Post-Debridement Wound Measurements: 0.5 cm x 0.2 cm x 0.1 cm  Type of Debridement: Sharp Excisional Tissue Removed: Non-viable soft tissue Depth of Debridement: subcutaneous tissue. Technique: Sharp excisional debridement to bleeding, viable wound base.  Dressing: Dry, sterile, compression dressing. Disposition: Patient tolerated procedure well.    Return in about 2 weeks (around 09/28/2022) for wound care.

## 2022-09-23 ENCOUNTER — Ambulatory Visit (HOSPITAL_COMMUNITY): Payer: Medicare Other | Attending: Cardiovascular Disease

## 2022-09-23 DIAGNOSIS — I350 Nonrheumatic aortic (valve) stenosis: Secondary | ICD-10-CM | POA: Diagnosis not present

## 2022-09-23 LAB — ECHOCARDIOGRAM COMPLETE
AR max vel: 2.15 cm2
AV Area VTI: 2.03 cm2
AV Area mean vel: 2.12 cm2
AV Mean grad: 11.2 mmHg
AV Peak grad: 20.2 mmHg
Ao pk vel: 2.25 m/s
Area-P 1/2: 3.89 cm2
MV M vel: 4.72 m/s
MV Peak grad: 89.1 mmHg
S' Lateral: 3.3 cm

## 2022-09-24 ENCOUNTER — Ambulatory Visit: Payer: Medicare Other | Admitting: Physical Therapy

## 2022-09-24 ENCOUNTER — Telehealth: Payer: Self-pay | Admitting: Cardiology

## 2022-09-24 NOTE — Telephone Encounter (Signed)
Patient is returning call to discuss echo results. °

## 2022-09-24 NOTE — Telephone Encounter (Signed)
Elizabeth Bergeron, MD  P Cv Div Ch St Triage Her echo shows normal pumping function. There is mild leakiness of the mitral valve and moderate leakiness of the tricuspid valve. There continues to be mild aortic stenosis which is stable. We will continue with monitoring with echoes every 2 years.  The patient has been notified of the result and verbalized understanding.  All questions (if any) were answered. Michaelyn Barter, RN 09/24/2022 5:08 PM

## 2022-09-28 ENCOUNTER — Ambulatory Visit (INDEPENDENT_AMBULATORY_CARE_PROVIDER_SITE_OTHER): Payer: Medicare Other | Admitting: Podiatry

## 2022-09-28 VITALS — BP 128/84

## 2022-09-28 DIAGNOSIS — L97522 Non-pressure chronic ulcer of other part of left foot with fat layer exposed: Secondary | ICD-10-CM | POA: Diagnosis not present

## 2022-09-28 NOTE — Progress Notes (Signed)
Chief Complaint  Patient presents with   Foot Ulcer    HPI: 76 y.o. female presenting today for follow-up evaluation of an ulcer to the distal tip of the left great toe.  Patient states that overall there is improvement.  She presents for further treatment and evaluation.  No new complaints at this time  Past Medical History:  Diagnosis Date   Acute pulmonary embolism ([place]) 06/2013   Arthritis    Asthma    years ago    Clotting disorder (HCC)    DVT/PE   Diverticulosis    minor    Dyslipidemia    Esophageal reflux    Hypertension    Hypothyroidism    Numbness    TOES   Obesity    OSA (obstructive sleep apnea)    severe OSA with an AHI of 51/hr and nocturnal hypoxemia with O2 sats at low as 69%.   Paroxysmal atrial fibrillation (HCC)    1 isolated episode in the setting of acute stress with no further episodes.    Pseudogout     Past Surgical History:  Procedure Laterality Date   BREAST RECONSTRUCTION     CARDIOVERSION N/A 06/27/2020   Procedure: CARDIOVERSION;  Surgeon: [name], MD;  Location: [facility];  Service: Cardiovascular;  Laterality: N/A;   CESAREAN SECTION     COLONOSCOPY  2004   repeat in ten years   KNEE ARTHROSCOPY Left    TONSILLECTOMY     TOTAL KNEE ARTHROPLASTY Right 01/01/2014   Procedure: TOTAL RIGHT KNEE ARTHROPLASTY;  Surgeon: [name], MD;  Location: [facility];  Service: Orthopedics;  Laterality: Right;   TOTAL KNEE ARTHROPLASTY Left 07/23/2014   Procedure: LEFT TOTAL KNEE ARTHROPLASTY;  Surgeon: [name], MD;  Location: [facility];  Service: Orthopedics;  Laterality: Left;   tummy tuck  1987   WISDOM TOOTH EXTRACTION      Allergies  Allergen Reactions   Nsaids Other (See Comments)    significantly raises BP   Bee Venom Swelling   Other     Artificial sweeteners cause dizziness and altered mental state   Codeine Nausea Only    Physical Exam: General: The patient is alert and oriented x3 in no acute  distress.  Dermatology: The ulcer to the distal tip of the left great toe has improved significantly.  It appears very dry and stable.  No erythema or edema.  Measures approximately 0.2 x 0.2 x 0.1 cm.  No drainage noted.  Vascular: Palpable pedal pulses bilaterally. Capillary refill within normal limits.  Negative for any significant edema or erythema  Neurological: Light touch and protective threshold grossly intact  Musculoskeletal Exam: Mallet toe deformity noted contributory to the ulcer development as well as hammertoes to the lesser digits  Assessment: 1.  Ulcer distal tip of the left great toe 2.  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 Patient evaluated.   2.  Medically necessary excisional debridement including subcutaneous tissue was performed using a tissue nipper.  Excisional debridement of the necrotic nonviable tissue down to healthier bleeding viable tissue was performed with postdebridement measurement same as pre- 3.  Continue antibiotic ointment and a light dressing daily 4.  Continue offloading felt dancers pads that were applied to the insoles of the patient's shoes to offload pressure from the great toe 5.  Return to clinic 3 weeks     Edrick Kins, [place]  Dr. Edrick Kins, DPM    [address], [place] [address]                Office [phone]  Fax [phone]

## 2022-09-30 NOTE — Therapy (Signed)
OUTPATIENT PHYSICAL THERAPY LOWER EXTREMITY EVALUATION   Patient Name: Elizabeth Wang MRN: [id number] DOB:03-30-1946, 76 y.o., female Today's Date: 10/01/2022  END OF SESSION:  PT End of Session - 10/01/22 1122     Visit Number 1    Number of Visits 16    Date for PT Re-Evaluation 11/26/22    Authorization Type MCR/ BCBS  6th and 10th FOTO  10th note progress note    Progress Note Due on Visit 10    PT Start Time 0932    PT Stop Time 1015    PT Time Calculation (min) 43 min    Activity Tolerance Patient tolerated treatment well    Behavior During Therapy [facility] for tasks assessed/performed             Past Medical History:  Diagnosis Date   Acute pulmonary embolism ([place]) 06/2013   Arthritis    Asthma    years ago    Clotting disorder ([place])    DVT/PE   Diverticulosis    minor    Dyslipidemia    Esophageal reflux    Hypertension    Hypothyroidism    Numbness    TOES   Obesity    OSA (obstructive sleep apnea)    severe OSA with an AHI of 51/hr and nocturnal hypoxemia with O2 sats at low as 69%.   Paroxysmal atrial fibrillation (HCC)    1 isolated episode in the setting of acute stress with no further episodes.    Pseudogout    Past Surgical History:  Procedure Laterality Date   BREAST RECONSTRUCTION     CARDIOVERSION N/A 06/27/2020   Procedure: CARDIOVERSION;  Surgeon: [name], MD;  Location: [facility];  Service: Cardiovascular;  Laterality: N/A;   CESAREAN SECTION     COLONOSCOPY  2004   repeat in ten years   KNEE ARTHROSCOPY Left    TONSILLECTOMY     TOTAL KNEE ARTHROPLASTY Right 01/01/2014   Procedure: TOTAL RIGHT KNEE ARTHROPLASTY;  Surgeon: [name], MD;  Location: [facility];  Service: Orthopedics;  Laterality: Right;   TOTAL KNEE ARTHROPLASTY Left 07/23/2014   Procedure: LEFT TOTAL KNEE ARTHROPLASTY;  Surgeon: [name], MD;  Location: [facility];  Service: Orthopedics;  Laterality: Left;   tummy tuck  1987   WISDOM TOOTH EXTRACTION     Patient  Active Problem List   Diagnosis Date Noted   Gout 01/12/2014   Constipation 01/12/2014   S/P total knee arthroplasty 01/11/2014   Hypokalemia 01/03/2014   Hyponatremia 01/03/2014   Postoperative anemia due to acute blood loss 01/02/2014   Hypothyroidism 01/02/2014   Esophageal reflux 01/02/2014   Other and unspecified hyperlipidemia 01/02/2014   OA (osteoarthritis) of knee 01/01/2014   Essential hypertension, benign 11/28/2013   Pulmonary embolism ([place]) 11/28/2013   DVT (deep venous thrombosis) ([place]) 08/09/2013   Other pulmonary embolism and infarction 08/09/2013   Persistent atrial fibrillation ([place]) 07/14/2013   Knee pain, bilateral 07/14/2013    PCP: Ginger Organ. MD  REFERRING PROVIDER: Armond Hang, MD   REFERRING DIAG: [id number] (ICD-10-CM) - Pain in left ankle and joints of left foot   THERAPY DIAG:  Stiffness of left ankle, not elsewhere classified - Plan: PT plan of care cert/re-cert  Difficulty in walking, not elsewhere classified - Plan: PT plan of care cert/re-cert  Pain of left great toe - Plan: PT plan of care cert/re-cert  Rationale for Evaluation and Treatment: Rehabilitation  ONSET DATE: 1977 a sprain with  no good care   SUBJECTIVE:   SUBJECTIVE STATEMENT: I have had a problem with my LT ankle since 1977 sprain. I had a PE 10 years ago and I worked with [employer] [facility] to began walking.   I work with [name] personal trainer and works with me on my gait. I have a stiff ankle on my LT .  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  DR. Kathaleen Bury suggested a lift for the great toe but the pad I use seems to do better.  I would like to walk better.  I want to try dry needling and mobilization before I try to go to [facility] for possible talar joint replacement.   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 Yes: NPRS scale: 9/10 Pain location: great toe LT,  unable to descend 1st ray Pain description: dull ache Aggravating factors: walking on it when I walk > 7000 steps,  just living,  sometimes just throbbing, steps Relieving factors: wearing a toe pad, salonpas topical No pain in my left ankle, uncomfortable because it does not bend PRECAUTIONS: None  WEIGHT BEARING RESTRICTIONS: No  FALLS:  Has patient fallen in last 6 months? Yes. Number of falls 1 when she tripped over a curb.   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  I have pain in my great toe.  I want to be able to move and go up stairs better  NEXT MD VISIT:   OBJECTIVE:   DIAGNOSTIC FINDINGS: x rays recently taken but not available at [place]:  FOTO 51%  predicted 64%  COGNITION: Overall cognitive status: Within functional limits for tasks assessed     SENSATION: WFL  EDEMA:  Figure 8: RT 54 cm, LT 61 cm  MUSCLE LENGTH: Hamstrings: Right 53 deg; Left 50 deg   POSTURE: rounded shoulders, forward head, flexed trunk , and obesity  PALPATION: Not pain but increased stiffness / blocked talar glide  LOWER EXTREMITY ROM:  Active ROM Right eval Left eval  Hip flexion    Hip extension    Hip abduction    Hip adduction    Hip internal rotation    Hip external rotation    Knee flexion    Knee extension    Ankle dorsiflexion 5 +9 from neutral  Ankle plantarflexion 65 51  Ankle inversion 27 14  Ankle eversion 20 14  Great toe flexion 50 25  Great toe extension 45 10   (Blank rows = not tested)  LOWER EXTREMITY MMT:  MMT Right eval Left eval  Hip flexion  4+ 4  Hip extension 4 4-  Hip abduction 4- 4-  Hip adduction    Hip internal rotation    Hip external rotation    Knee flexion    Knee extension 5 4  Ankle dorsiflexion 5 4  Ankle plantarflexion    Ankle inversion 4+ 4-  Ankle eversion 4+ 4-   (Blank rows = not tested)    FUNCTIONAL TESTS:  5 times sit to stand:  11.15 2 minute walk test: TBD  Sustained bridge  32 seconds   SLS RT 23 sec  LT 10sec but pain  [facility] Signs Eval (12-7- 2023)          5 x  STS 11.73 sec but wt bears to RT            Max pain LT great toe 9/10            6MWT               sustained bridge  32 sec              SLS RT 23/LT 10 sec               GAIT: Distance walked: 150 Assistive device utilized: None Level of assistance: Complete Independence Comments: antalgic gait left, with trunk lean to RT Pt with hammer toes and ulcer on LT great toe healing , with an extension pad.  Pt is unable to depress 1st ray with gait in stance/ push off phase, Pt tends to toe grip with shoes and is wearing more sneakers rather than sandals presently.   TODAY'S TREATMENT:                                                                                                                              DATE: 10-01-22 eval    PATIENT EDUCATION:  Education details: POC FOTO report, explanation of finds.  Course of care Person educated: Patient Education method: Explanation Education comprehension: verbalized understanding and needs further education  HOME EXERCISE PROGRAM: TBD  ASSESSMENT:  CLINICAL IMPRESSION: Patient is a 76 y.o. female who was seen today for physical therapy evaluation and treatment for LT ankle/toe pain and stiffness. Pt has had hx of PE and long standing injury from ankle sprain since 1977.  Pt LT ankle with evidence of long standing chronic swelling and hammer toes.  Pt with decreased motion in LT great toe with decreased ability to flex/ext causing most of pain in foot. Pt LT great toe with  healing ulcer as well. Ms Maes is active in community and church and would like to maximize function as much as possible before investigating more invasive options.  OBJECTIVE IMPAIRMENTS: Abnormal gait, decreased balance, difficulty walking, decreased ROM, decreased strength, hypomobility, obesity, and pain.   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   REHAB POTENTIAL: Good  CLINICAL DECISION MAKING: Evolving/moderate complexity  EVALUATION COMPLEXITY: Moderate   GOALS: Goals reviewed with patient? No  SHORT TERM GOALS: Target date: 10-29-21 Pt will be independent with intial HEP Baseline:no knowledge Goal status: INITIAL  2.  Pt will be able perform STS with even wt distribution  in bil LE Baseline: Pt wt bears to RT in STS Goal status: INITIAL  3.  Pt pain with functional activities in Great toe will be reduced to 5/10  Baseline: 9/10 Goal status: INITIAL   LONG TERM GOALS: Target date: 11-26-22  Pt will be independent with advanced HEP.  Baseline:  Goal status: INITIAL  2.  FOTO will improve from  51%  to   64%  indicating improved functional mobility.  Baseline: eval 51% Goal status: INITIAL  3.  Pain will decrease to 3/10 with all functional activities from 9/10 Baseline: Pt max pain after functional activities. 9/10 Goal status: INITIAL  4.  Pt will be able to perform 6MWT within normal range for age Baseline: TBD Goal status: INITIAL  5.  Pt will be able to perform balance with SLS for at least 30 sec on RT and LT to show improved balance/strength in SL activities Baseline: RT 23 sec  LT 10 sec tries to use  UE Goal status: INITIAL  6.  Pt will be able to perform floor to stand transfer to show  increased strength and safe fall preparedness  Baseline: unable to perform Goal status: INITIAL   7. Demonstrate 4 + /5 LT LE strength to improve stability, safety and endurance of community level function  Baseline: 4-/5   Goal status: INTIAL    PLAN:  PT FREQUENCY: 1-2x/week  PT DURATION: 8 weeks  PLANNED INTERVENTIONS: Therapeutic exercises, Therapeutic activity, Neuromuscular re-education, Balance training, Gait training, Patient/Family education, Self Care, Joint mobilization, Stair training, Dry Needling, Electrical stimulation, Cryotherapy, Moist heat, Taping, Ionotophoresis '4mg'$ /ml Dexamethasone, Manual therapy, and Re-evaluation  PLAN FOR NEXT SESSION: TPDN, Manual,  issue HEP test PF,  DGI?   [name], PT, [place] Certified Exercise Expert for the Aging Adult  10/01/22 12:52 PM Phone: [phone] Fax: [phone]

## 2022-10-01 ENCOUNTER — Other Ambulatory Visit: Payer: Self-pay

## 2022-10-01 ENCOUNTER — Ambulatory Visit: Payer: Medicare Other | Attending: Internal Medicine | Admitting: Physical Therapy

## 2022-10-01 DIAGNOSIS — M25672 Stiffness of left ankle, not elsewhere classified: Secondary | ICD-10-CM | POA: Insufficient documentation

## 2022-10-01 DIAGNOSIS — M79675 Pain in left toe(s): Secondary | ICD-10-CM | POA: Insufficient documentation

## 2022-10-01 DIAGNOSIS — R262 Difficulty in walking, not elsewhere classified: Secondary | ICD-10-CM | POA: Insufficient documentation

## 2022-10-02 ENCOUNTER — Other Ambulatory Visit (HOSPITAL_BASED_OUTPATIENT_CLINIC_OR_DEPARTMENT_OTHER): Payer: Self-pay

## 2022-10-02 MED ORDER — AREXVY 120 MCG/0.5ML IM SUSR
INTRAMUSCULAR | 0 refills | Status: AC
  Filled 2022-10-02: qty 0.5, 1d supply, fill #0

## 2022-10-05 ENCOUNTER — Ambulatory Visit: Payer: Medicare Other | Admitting: Podiatry

## 2022-10-06 ENCOUNTER — Other Ambulatory Visit (HOSPITAL_BASED_OUTPATIENT_CLINIC_OR_DEPARTMENT_OTHER): Payer: Self-pay

## 2022-10-06 NOTE — Therapy (Incomplete)
OUTPATIENT PHYSICAL THERAPY TREATMENT NOTE   Patient Name: Elizabeth Wang MRN: [id number] DOB:29-Jan-1946, 76 y.o., female Today's Date: 10/06/2022  PCP: Ginger Organ. MD  REFERRING PROVIDER:  Armond Hang, MD    END OF SESSION:    Past Medical History:  Diagnosis Date   Acute pulmonary embolism ([place]) 06/2013   Arthritis    Asthma    years ago    Clotting disorder (HCC)    DVT/PE   Diverticulosis    minor    Dyslipidemia    Esophageal reflux    Hypertension    Hypothyroidism    Numbness    TOES   Obesity    OSA (obstructive sleep apnea)    severe OSA with an AHI of 51/hr and nocturnal hypoxemia with O2 sats at low as 69%.   Paroxysmal atrial fibrillation (HCC)    1 isolated episode in the setting of acute stress with no further episodes.    Pseudogout    Past Surgical History:  Procedure Laterality Date   BREAST RECONSTRUCTION     CARDIOVERSION N/A 06/27/2020   Procedure: CARDIOVERSION;  Surgeon: [name], MD;  Location: [facility];  Service: Cardiovascular;  Laterality: N/A;   CESAREAN SECTION     COLONOSCOPY  2004   repeat in ten years   KNEE ARTHROSCOPY Left    TONSILLECTOMY     TOTAL KNEE ARTHROPLASTY Right 01/01/2014   Procedure: TOTAL RIGHT KNEE ARTHROPLASTY;  Surgeon: [name], MD;  Location: [facility];  Service: Orthopedics;  Laterality: Right;   TOTAL KNEE ARTHROPLASTY Left 07/23/2014   Procedure: LEFT TOTAL KNEE ARTHROPLASTY;  Surgeon: [name], MD;  Location: [facility];  Service: Orthopedics;  Laterality: Left;   tummy tuck  1987   WISDOM TOOTH EXTRACTION     Patient Active Problem List   Diagnosis Date Noted   Gout 01/12/2014   Constipation 01/12/2014   S/P total knee arthroplasty 01/11/2014   Hypokalemia 01/03/2014   Hyponatremia 01/03/2014   Postoperative anemia due to acute blood loss 01/02/2014   Hypothyroidism 01/02/2014   Esophageal reflux 01/02/2014   Other and unspecified hyperlipidemia 01/02/2014   OA  (osteoarthritis) of knee 01/01/2014   Essential hypertension, benign 11/28/2013   Pulmonary embolism ([place]) 11/28/2013   DVT (deep venous thrombosis) ([place]) 08/09/2013   Other pulmonary embolism and infarction 08/09/2013   Persistent atrial fibrillation ([place]) 07/14/2013   Knee pain, bilateral 07/14/2013    REFERRING DIAG: M25.572 (ICD-10-CM) - Pain in left ankle and joints of left foot    THERAPY DIAG:  No diagnosis found.  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 I had a PE 10 years ago and I worked with [employer] [facility] to began walking.   I work with [name] personal trainer and works with me on my gait. I have a stiff ankle on my LT .  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  DR. Kathaleen Bury suggested a lift for the great toe but the pad I use seems to do better.  I would like to walk better.  I want to try dry needling and mobilization before I try to go to [facility] for possible talar joint replacement.   PAIN:   Are you having pain? Yes: NPRS scale: 9/10 Pain location: great toe LT,  unable to descend 1st ray Pain description: dull ache Aggravating factors: walking on it when I walk > 7000 steps,  just living,  sometimes just throbbing, steps, No pain in my left ankle, uncomfortable because it does not bend Relieving factors: wearing a toe pad, salonpas topical  OBJECTIVE: (objective measures completed at initial evaluation unless otherwise dated)   DIAGNOSTIC FINDINGS: x rays recently  taken but not available at [place]:  FOTO 51%  predicted 64%   COGNITION: Overall cognitive status: Within functional limits for tasks assessed                         SENSATION: WFL   EDEMA:  Figure 8: RT 54 cm, LT 61 cm   MUSCLE LENGTH: Hamstrings: Right 53 deg; Left 50 deg     POSTURE: rounded shoulders, forward head, flexed trunk , and obesity   PALPATION: Not pain but increased stiffness / blocked talar glide   LOWER EXTREMITY ROM:   Active ROM Right eval Left eval  Hip flexion      Hip extension      Hip abduction      Hip adduction      Hip internal rotation      Hip external rotation      Knee flexion      Knee extension      Ankle dorsiflexion 5 +9 from neutral  Ankle plantarflexion 65 51  Ankle inversion 27 14  Ankle eversion 20 14  Great toe flexion 50 25  Great toe extension 45 10   (Blank rows = not tested)   LOWER EXTREMITY MMT:   MMT Right eval Left eval  Hip flexion 4+ 4  Hip extension 4 4-  Hip abduction 4- 4-  Hip adduction      Hip internal rotation      Hip external rotation      Knee flexion      Knee extension 5 4  Ankle dorsiflexion 5 4  Ankle plantarflexion      Ankle inversion 4+ 4-  Ankle eversion 4+ 4-   (Blank rows = not tested)       FUNCTIONAL TESTS:  5 times sit to stand: 11.15 2 minute walk test: TBD            Sustained bridge  32 seconds             SLS RT 23 sec  LT 10sec but pain   Asterisk Signs Eval (12-7- 2023)            5 x  STS 11.73 sec but wt bears to RT  Max pain LT great toe 9/10             6MWT               sustained bridge  32 sec              SLS RT 23/LT 10 sec                 GAIT: Distance walked: 150 Assistive device utilized: None Level of assistance: Complete Independence Comments: antalgic gait left, with trunk lean to RT Pt with hammer toes and ulcer on LT great toe healing , with an extension pad.  Pt is unable to depress 1st ray with gait in  stance/ push off phase, Pt tends to toe grip with shoes and is wearing more sneakers rather than sandals presently.    TODAY'S TREATMENT:                                                                                                                              DATE: 10-01-22 eval      PATIENT EDUCATION:  Education details: POC FOTO report, explanation of finds.  Course of care Person educated: Patient Education method: Explanation Education comprehension: verbalized understanding and needs further education   HOME EXERCISE PROGRAM:  Access Code: [email or web address] URL: [email or web address] Date: 10/06/2022 Prepared by: [name]  Exercises - Long Sitting Calf Stretch with Strap  - 2 x daily - 7 x weekly - 2 sets - 30 seconds hold - Seated Arch Lifts  - 1 x daily - 7 x weekly - 3 sets - 10 reps - 5 hold - Seated Plantar Fascia Stretch  - 2 x daily - 7 x weekly - 2 sets - 30 hold - Plantar Fascia Stretch on Step  - 2 x daily - 7 x weekly - 2 sets - 30 seconds hold - Gastroc Stretch on Wall  - 2 x daily - 7 x weekly - 2 sets - 30 seconds hold - Soleus Stretch on Wall  - 2 x daily - 7 x weekly - 2 sets - 30 seconds hold - Tibialis Posterior Stretch at Wall  - 2 x daily - 7 x weekly - 2 sets - 30 seconds hold - Single Leg Stance  - 1 x daily - 7 x weekly - 30 to 60 seconds hold - Clamshell with Resistance  - 1-15 x daily - 7 x weekly - 2 sets - 10 reps - Goblet Squat with Kettlebell  - 1 x daily - 7 x weekly - 3 sets - 10-12 reps - Alternating Single Leg Bridge  - 1 x daily - 7 x weekly - 3 sets - 10 reps - Standing Bilateral Heel Raise on Step  - 1 x daily - 7 x weekly - 3-4 sets - 10 reps - Seated Heel Raise  - 1 x daily - 7 x weekly - 1-2 sets - 10 reps - Long  Sitting Ankle Inversion with Resistance  - 1 x daily - 7 x weekly - 3 sets - 10 reps - 5 hold - Long Sitting Ankle Dorsiflexion with Anchored Resistance  - 1 x daily - 7 x weekly - 3 sets - 10 reps - 5 hold    ASSESSMENT:   CLINICAL IMPRESSION: Patient is a 76 y.o. female who was seen today for physical therapy evaluation and treatment for LT ankle/toe pain and stiffness. Pt has had hx of PE and long standing injury from ankle sprain since 1977.  Pt LT ankle with evidence of long standing chronic swelling and hammer toes.  Pt with decreased motion in LT great toe with decreased ability to flex/ext causing most of pain in foot. Pt LT great toe with healing ulcer as well. Ms Gundlach is active in community and church and would like to maximize function as much as possible before investigating more invasive options.   OBJECTIVE IMPAIRMENTS: Abnormal gait, decreased balance, difficulty walking, decreased ROM, decreased strength, hypomobility, obesity, and pain.    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    REHAB POTENTIAL: Good   CLINICAL DECISION MAKING: Evolving/moderate complexity   EVALUATION COMPLEXITY: Moderate     GOALS: Goals reviewed with patient? No   SHORT TERM GOALS: Target date: 10-29-21 Pt will be independent with intial HEP Baseline:no knowledge Goal status: INITIAL   2.  Pt will be able perform STS with even wt distribution in bil LE Baseline: Pt wt bears to RT in STS Goal status: INITIAL   3.  Pt pain with functional activities in Great toe will be reduced to 5/10  Baseline: 9/10 Goal status: INITIAL     LONG TERM GOALS: Target date: 11-26-22   Pt will be independent with advanced HEP.  Baseline:  Goal status: INITIAL   2.  FOTO will improve from  51%  to   64%  indicating improved functional mobility.  Baseline:  eval 51% Goal status: INITIAL   3.  Pain will decrease to 3/10 with all functional activities from 9/10 Baseline: Pt max pain after functional activities. 9/10 Goal status: INITIAL   4.  Pt will be able to perform 6MWT within normal range for age Baseline: TBD Goal status: INITIAL   5.  Pt will be able to perform balance with SLS for at least 30 sec on RT and LT to show improved balance/strength in SL activities Baseline: RT 23 sec  LT 10 sec tries to use UE Goal status: INITIAL   6.  Pt will be able to perform floor to stand transfer to show  increased strength and safe fall preparedness  Baseline: unable to perform Goal status: INITIAL    7. Demonstrate 4 + /5 LT LE strength to improve stability, safety and endurance of community level function            Baseline: 4-/5             Goal status: INTIAL       PLAN:   PT FREQUENCY: 1-2x/week   PT DURATION: 8 weeks   PLANNED INTERVENTIONS: Therapeutic exercises, Therapeutic activity, Neuromuscular re-education, Balance training, Gait training, Patient/Family education, Self Care, Joint mobilization, Stair training, Dry Needling, Electrical stimulation,  Cryotherapy, Moist heat, Taping, Ionotophoresis '4mg'$ /ml Dexamethasone, Manual therapy, and Re-evaluation   PLAN FOR NEXT SESSION: TPDN, Manual,  issue HEP test PF,  DGI?     ***

## 2022-10-07 ENCOUNTER — Ambulatory Visit: Payer: Medicare Other | Admitting: Physical Therapy

## 2022-10-07 ENCOUNTER — Encounter: Payer: Self-pay | Admitting: Physical Therapy

## 2022-10-07 DIAGNOSIS — R262 Difficulty in walking, not elsewhere classified: Secondary | ICD-10-CM | POA: Diagnosis not present

## 2022-10-07 DIAGNOSIS — M25672 Stiffness of left ankle, not elsewhere classified: Secondary | ICD-10-CM

## 2022-10-07 DIAGNOSIS — M79675 Pain in left toe(s): Secondary | ICD-10-CM | POA: Diagnosis not present

## 2022-10-07 NOTE — Therapy (Signed)
OUTPATIENT PHYSICAL THERAPY TREATMENT NOTE   Patient Name: Elizabeth Wang MRN: [id number] DOB:1946/03/02, 76 y.o., female Today's Date: 10/07/2022  PCP: Ginger Organ. MD  REFERRING PROVIDER: Armond Hang, MD    END OF SESSION:   PT End of Session - 10/07/22 1419     Visit Number 2    Number of Visits 16    Date for PT Re-Evaluation 11/26/22    Authorization Type MCR/ BCBS  6th and 10th FOTO  10th note progress note    Progress Note Due on Visit 10    PT Start Time 1419    Activity Tolerance Patient tolerated treatment well    Behavior During Therapy [facility] for tasks assessed/performed             Past Medical History:  Diagnosis Date   Acute pulmonary embolism ([place]) 06/2013   Arthritis    Asthma    years ago    Clotting disorder ([place])    DVT/PE   Diverticulosis    minor    Dyslipidemia    Esophageal reflux    Hypertension    Hypothyroidism    Numbness    TOES   Obesity    OSA (obstructive sleep apnea)    severe OSA with an AHI of 51/hr and nocturnal hypoxemia with O2 sats at low as 69%.   Paroxysmal atrial fibrillation (HCC)    1 isolated episode in the setting of acute stress with no further episodes.    Pseudogout    Past Surgical History:  Procedure Laterality Date   BREAST RECONSTRUCTION     CARDIOVERSION N/A 06/27/2020   Procedure: CARDIOVERSION;  Surgeon: [name], MD;  Location: [facility];  Service: Cardiovascular;  Laterality: N/A;   CESAREAN SECTION     COLONOSCOPY  2004   repeat in ten years   KNEE ARTHROSCOPY Left    TONSILLECTOMY     TOTAL KNEE ARTHROPLASTY Right 01/01/2014   Procedure: TOTAL RIGHT KNEE ARTHROPLASTY;  Surgeon: [name], MD;  Location: [facility];  Service: Orthopedics;  Laterality: Right;   TOTAL KNEE ARTHROPLASTY Left 07/23/2014   Procedure: LEFT TOTAL KNEE ARTHROPLASTY;  Surgeon: [name], MD;  Location: [facility];  Service: Orthopedics;  Laterality: Left;   tummy tuck  1987   WISDOM TOOTH EXTRACTION      Patient Active Problem List   Diagnosis Date Noted   Gout 01/12/2014   Constipation 01/12/2014   S/P total knee arthroplasty 01/11/2014   Hypokalemia 01/03/2014   Hyponatremia 01/03/2014   Postoperative anemia due to acute blood loss 01/02/2014   Hypothyroidism 01/02/2014   Esophageal reflux 01/02/2014   Other and unspecified hyperlipidemia 01/02/2014   OA (osteoarthritis) of knee 01/01/2014   Essential hypertension, benign 11/28/2013   Pulmonary embolism ([place]) 11/28/2013   DVT (deep venous thrombosis) ([place]) 08/09/2013   Other pulmonary embolism and infarction 08/09/2013   Persistent atrial fibrillation ([place]) 07/14/2013   Knee pain, bilateral 07/14/2013    REFERRING DIAG: M25.572 (ICD-10-CM) - Pain in left ankle and joints of left foot    THERAPY DIAG:  Stiffness of left ankle, not elsewhere classified  Difficulty in walking, not elsewhere classified  Rationale for Evaluation and Treatment Rehabilitation  PERTINENT HISTORY: 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 "   PAIN:  Are you having pain? Yes: NPRS scale: 0/10 Pain location: great toe LT,  unable to descend 1st ray Pain description: dull ache Aggravating factors: walking on it when I walk > 7000 steps,  just living,  sometimes just throbbing, steps Relieving factors: wearing a toe pad, salonpas topical No pain in my left ankle, uncomfortable because it does not bend   OBJECTIVE: (objective measures completed at initial evaluation unless otherwise  dated)   DIAGNOSTIC FINDINGS: x rays recently taken but not available at [place]:  FOTO 51%  predicted 64%   COGNITION: Overall cognitive status: Within functional limits for tasks assessed                         SENSATION: WFL   EDEMA:  Figure 8: RT 54 cm, LT 61 cm   MUSCLE LENGTH: Hamstrings: Right 53 deg; Left 50 deg     POSTURE: rounded shoulders, forward head, flexed trunk , and obesity   PALPATION: Not pain but increased stiffness / blocked talar glide   LOWER EXTREMITY ROM:   Active ROM Right eval Left eval  Hip flexion      Hip extension      Hip abduction      Hip adduction      Hip internal rotation      Hip external rotation      Knee flexion      Knee extension      Ankle dorsiflexion 5 +9 from neutral  Ankle plantarflexion 65 51  Ankle inversion 27 14  Ankle eversion 20 14  Great toe flexion 50 25  Great toe extension 45 10   (Blank rows = not tested)   LOWER EXTREMITY MMT:   MMT Right eval Left eval  Hip flexion 4+ 4  Hip extension 4 4-  Hip abduction 4- 4-  Hip adduction      Hip internal rotation      Hip external rotation      Knee flexion      Knee extension 5 4  Ankle dorsiflexion 5 4  Ankle plantarflexion      Ankle inversion 4+ 4-  Ankle eversion 4+ 4-   (Blank rows = not tested)       FUNCTIONAL TESTS:  5 times sit to stand: 11.15 2 minute walk test: TBD            Sustained bridge  32 seconds             SLS RT 23 sec  LT 10sec but pain   [facility] Signs Eval (12-7- 2023)            5 x  STS 11.73 sec but wt bears to RT            Max pain LT great toe 9/10             6MWT               sustained bridge  32 sec              SLS RT 23/LT 10 sec                 GAIT: Distance walked: 150 Assistive device utilized: None Level of assistance: Complete Independence Comments: antalgic gait  left, with trunk lean to RT Pt with hammer toes and ulcer on LT great toe healing , with an extension pad.  Pt  is unable to depress 1st ray with gait in stance/ push off phase, Pt tends to toe grip with shoes and is wearing more sneakers rather than sandals presently.    TODAY'S TREATMENT:  [facility] Adult PT Treatment:                                                DATE: 10/07/2022 Therapeutic Exercise: Seated ankle PF/ DF with iFitter 1 x 15 LLE only 1 x 20 using bil LE Seated ankle supination/ pronation with iFitter 1 x 20 LLE only  Towel Inversion/ eversion 1 x 10 ea direction Seated heel slides bil LE squeezing towel between 1st MCP's bil to reduce eversion.  Manual Therapy: Prone ankle edema reduction massage with knee flexion to maixmize gravity assist. Talocrural distraction with mob belts grade III-IV Subtalar mobs grade III for both for supination/ pronation MTPR along gastroc/ soleus.                                                                                                                   DATE: 10-01-22 eval      PATIENT EDUCATION:  Education details: POC FOTO report, explanation of finds.  Course of care Person educated: Patient Education method: Explanation Education comprehension: verbalized understanding and needs further education   HOME EXERCISE PROGRAM: Access Code: [email or web address] URL: [email or web address] Date: 10/07/2022 Prepared by: Starr Lake  Exercises - Seated Toe Raise  - 1 x daily - 7 x weekly - 2 sets - 10 reps - Ankle Inversion Eversion Towel Slide  - 1 x daily - 7 x weekly - 2 sets - 10 reps - Seated Heel Slide  - 1 x daily - 7 x weekly - 2 sets - 10 reps - Seated Calf Stretch with Strap  - 1 x daily - 7 x weekly - 2 sets - 3 reps - 30 hold   ASSESSMENT:   CLINICAL IMPRESSION: Mrs solano arrives to PT reporting no pain in the ankle but continues to report pain at the 1st toe as a result of a pressure sore secondary to her hammer toe. Worked on edema reduction in prone followed with talocrural and subtalar mobs. She did well with AROM to  promote ROM in sitting. Visual she demonstrated increase in DF and reported improvement in ankle motion.    OBJECTIVE IMPAIRMENTS: Abnormal gait, decreased balance, difficulty walking, decreased ROM, decreased strength, hypomobility, obesity, and pain.    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    REHAB POTENTIAL: Good   CLINICAL DECISION MAKING: Evolving/moderate complexity   EVALUATION COMPLEXITY: Moderate     GOALS: Goals reviewed with patient? No   SHORT TERM GOALS: Target date: 10-29-21 Pt will be independent with intial HEP Baseline:no knowledge Goal status: INITIAL   2.  Pt will be able perform STS with even wt distribution in bil LE Baseline: Pt wt bears to RT in STS Goal status: INITIAL   3.  Pt pain with functional activities in Great toe will be reduced to 5/10  Baseline: 9/10 Goal status: INITIAL     LONG TERM GOALS: Target date: 11-26-22   Pt will be independent with advanced HEP.  Baseline:  Goal status: INITIAL   2.  FOTO will improve from  51%  to   64%  indicating improved functional mobility.  Baseline: eval 51% Goal status: INITIAL   3.  Pain will decrease to 3/10 with all functional activities from 9/10 Baseline: Pt max pain after functional activities. 9/10 Goal status: INITIAL   4.  Pt will be able to perform 6MWT within normal range for age Baseline: TBD Goal status: INITIAL   5.  Pt will be able to perform balance with SLS for at least 30 sec on RT and LT to show improved balance/strength in SL activities Baseline: RT 23 sec  LT 10 sec tries to use UE Goal status: INITIAL   6.  Pt will  be able to perform floor to stand transfer to show  increased strength and safe fall preparedness  Baseline: unable to perform Goal status: INITIAL    7. Demonstrate 4 + /5 LT LE strength to improve stability, safety and endurance of community level function            Baseline: 4-/5             Goal status: INTIAL       PLAN:   PT FREQUENCY: 1-2x/week   PT DURATION: 8 weeks   PLANNED INTERVENTIONS: Therapeutic exercises, Therapeutic activity, Neuromuscular re-education, Balance training, Gait training, Patient/Family education, Self Care, Joint mobilization, Stair training, Dry Needling, Electrical stimulation, Cryotherapy, Moist heat, Taping, Ionotophoresis '4mg'$ /ml Dexamethasone, Manual therapy, and Re-evaluation   PLAN FOR NEXT SESSION: TPDN, Manual, DGI? Response to edema reduction.    Corbitt Cloke PT, DPT, LAT, ATC  10/07/22  3:34 PM

## 2022-10-08 ENCOUNTER — Ambulatory Visit: Payer: Medicare Other | Admitting: Physical Therapy

## 2022-10-08 DIAGNOSIS — M79675 Pain in left toe(s): Secondary | ICD-10-CM | POA: Diagnosis not present

## 2022-10-08 DIAGNOSIS — M25672 Stiffness of left ankle, not elsewhere classified: Secondary | ICD-10-CM | POA: Diagnosis not present

## 2022-10-08 DIAGNOSIS — R262 Difficulty in walking, not elsewhere classified: Secondary | ICD-10-CM | POA: Diagnosis not present

## 2022-10-08 NOTE — Therapy (Signed)
OUTPATIENT PHYSICAL THERAPY TREATMENT NOTE   Patient Name: Elizabeth Wang MRN: [id number] DOB:Oct 20, 1946, 76 y.o., female Today's Date: 10/08/2022  PCP: Ginger Organ. MD  REFERRING PROVIDER: Armond Hang, MD    END OF SESSION:   PT End of Session - 10/08/22 0848     Visit Number 3    Number of Visits 16    Date for PT Re-Evaluation 11/26/22    Authorization Type MCR/ BCBS  6th and 10th FOTO  10th note progress note    Progress Note Due on Visit 10    PT Start Time [phone]    PT Stop Time 0930    PT Time Calculation (min) 43 min    Activity Tolerance Patient tolerated treatment well    Behavior During Therapy [facility] for tasks assessed/performed              Past Medical History:  Diagnosis Date   Acute pulmonary embolism ([place]) 06/2013   Arthritis    Asthma    years ago    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   Paroxysmal atrial fibrillation ([facility])    1 isolated episode in the setting of acute stress with no further episodes.    Pseudogout    Past Surgical History:  Procedure Laterality Date   BREAST RECONSTRUCTION     CARDIOVERSION N/A 06/27/2020   Procedure: CARDIOVERSION;  Surgeon: [name], MD;  Location: [facility];  Service: Cardiovascular;  Laterality: N/A;   CESAREAN SECTION     COLONOSCOPY  2004   repeat in ten years   KNEE ARTHROSCOPY Left    TONSILLECTOMY     TOTAL KNEE ARTHROPLASTY Right 01/01/2014   Procedure: TOTAL RIGHT KNEE ARTHROPLASTY;  Surgeon: [name], MD;  Location: [facility];  Service: Orthopedics;  Laterality: Right;   TOTAL KNEE ARTHROPLASTY Left 07/23/2014   Procedure: LEFT TOTAL KNEE ARTHROPLASTY;  Surgeon: [name], MD;  Location: [facility];  Service: Orthopedics;   Laterality: Left;   tummy tuck  1987   WISDOM TOOTH EXTRACTION     Patient Active Problem List   Diagnosis Date Noted   Gout 01/12/2014   Constipation 01/12/2014   S/P total knee arthroplasty 01/11/2014   Hypokalemia 01/03/2014   Hyponatremia 01/03/2014   Postoperative anemia due to acute blood loss 01/02/2014   Hypothyroidism 01/02/2014   Esophageal reflux 01/02/2014   Other and unspecified hyperlipidemia 01/02/2014   OA (osteoarthritis) of knee 01/01/2014   Essential hypertension, benign 11/28/2013   Pulmonary embolism ([place]) 11/28/2013   DVT (deep venous thrombosis) ([place]) 08/09/2013   Other pulmonary embolism and infarction 08/09/2013   Persistent atrial fibrillation ([place]) 07/14/2013   Knee pain, bilateral 07/14/2013    REFERRING DIAG: M25.572 (ICD-10-CM) - Pain in left ankle and joints of left foot    THERAPY DIAG:  Stiffness of left ankle, not elsewhere classified  Difficulty in walking, not elsewhere classified  Pain of left great toe  Rationale for Evaluation and Treatment Rehabilitation  PERTINENT HISTORY: 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 I am not sure how to use the app. On my phone   PAIN: 10-08-22  0/10 pain Are you having pain? Yes: NPRS scale: 0/10 Pain location: great toe LT,  unable to descend 1st ray Pain description: dull ache Aggravating factors: walking on it when I walk > 7000 steps,  just living,  sometimes just throbbing, steps Relieving factors: wearing a toe pad, salonpas topical No pain in my left ankle, uncomfortable because it does not  bend   OBJECTIVE: (objective measures completed at initial evaluation unless otherwise dated)   DIAGNOSTIC FINDINGS: x rays recently taken but not available at [place]:  FOTO 51%  predicted 64%   COGNITION: Overall cognitive status: Within functional limits for tasks assessed                         SENSATION: WFL   EDEMA:  Figure 8: RT 54 cm, LT 61 cm   MUSCLE LENGTH: Hamstrings: Right 53 deg; Left 50 deg     POSTURE: rounded shoulders, forward head, flexed trunk , and obesity   PALPATION: Not pain but increased stiffness / blocked talar glide   LOWER EXTREMITY ROM:   Active ROM Right eval Left eval  Hip flexion      Hip extension      Hip abduction      Hip adduction      Hip internal rotation      Hip external rotation      Knee flexion      Knee extension      Ankle dorsiflexion 5 +9 from neutral  Ankle plantarflexion 65 51  Ankle inversion 27 14  Ankle eversion 20 14  Great toe flexion 50 25  Great toe extension 45 10   (Blank rows = not tested)   LOWER EXTREMITY MMT:   MMT Right eval Left eval  Hip flexion 4+ 4  Hip extension 4 4-  Hip abduction 4- 4-  Hip adduction      Hip internal rotation      Hip external rotation      Knee flexion      Knee extension 5 4  Ankle dorsiflexion 5 4  Ankle plantarflexion      Ankle inversion 4+ 4-  Ankle eversion 4+ 4-   (  Blank rows = not tested)       FUNCTIONAL TESTS:  5 times sit to stand: 11.15 2 minute walk test: TBD            Sustained bridge  32 seconds             SLS RT 23 sec  LT 10sec but pain   [facility] Signs Eval (12-7- 2023)            5 x  STS 11.73 sec but wt bears to RT            Max pain LT great toe 9/10             6MWT               sustained bridge  32 sec              SLS RT 23/LT 10 sec             DF LT            GAIT: Distance walked: 150 Assistive device utilized: None Level of assistance: Complete Independence Comments: antalgic gait left,  with trunk lean to RT Pt with hammer toes and ulcer on LT great toe healing , with an extension pad.  Pt is unable to depress 1st ray with gait in stance/ push off phase, Pt tends to toe grip with shoes and is wearing more sneakers rather than sandals presently.    TODAY'S TREATMENT:  [facility] Adult PT Treatment:                                                DATE: 10-08-22 Therapeutic Exercise: Seated ankle  heel raise with 10 lb 2 x 10 Towel Inversion/ eversion 2 x 10 ea direction Seated heel slides bil LE squeezing towel between 1st MCP's bil to reduce eversion. Power band around ankle to increase DF with LT foot on footstool with 30 sec holds and brief rest for 5 min Manual Therapy: Prone ankle edema reduction massage with knee flexion to maixmize gravity assist. Talocrural distraction with mob belts grade III-IV Subtalar mobs grade III for both for supination/ pronation MTPR along gastroc/ soleus.   [facility] Adult PT Treatment:                                                DATE: 10/07/2022 Therapeutic Exercise: Seated ankle PF/ DF with iFitter 1 x 15 LLE only 1 x 20 using bil LE Seated ankle supination/ pronation with iFitter 1 x 20 LLE only  Towel Inversion/ eversion 1 x 10 ea direction Seated heel slides bil LE squeezing towel between 1st MCP's bil to reduce eversion.   Manual Therapy: Prone ankle edema reduction massage with knee flexion to maixmize gravity assist. Talocrural distraction with mob belts grade III-IV Subtalar mobs grade III for both for supination/ pronation MTPR along gastroc/ soleus.  DATE: 10-01-22 eval      PATIENT EDUCATION:  Education details: POC FOTO report, explanation of finds.  Course of care Person educated: Patient Education method: Explanation Education comprehension: verbalized understanding and needs further education   HOME EXERCISE  PROGRAM: Access Code: [email or web address] URL: [email or web address] Date: 10/08/2022 Prepared by: [name]  Program Notes Toe Yoga as shown in clinicDorsiflexion stretch on footstool with power band as shown in clinic.  Buy on [employer] with various strengths  Exercises - Seated Toe Raise  - 1 x daily - 7 x weekly - 2 sets - 10 reps - Ankle Inversion Eversion Towel Slide  - 1 x daily - 7 x weekly - 2 sets - 10 reps - Seated Heel Slide  - 1 x daily - 7 x weekly - 2 sets - 10 reps - Seated Calf Stretch with Strap  - 1 x daily - 7 x weekly - 2 sets - 3 reps - 30 hold   ASSESSMENT:   CLINICAL IMPRESSION: Mrs kuehne arrives to PT reporting no pain.Modified HEP to include self mob at home. Worked on edema reduction in prone followed with talocrural and subtalar mobs. She did well with AROM to promote ROM in sitting. Visual she demonstrated increase in DF and reported improvement in ankle motion.  Pt with visible signs of edema reduction, able to wrinkle at DF break in ankle.   OBJECTIVE IMPAIRMENTS: Abnormal gait, decreased balance, difficulty walking, decreased ROM, decreased strength, hypomobility, obesity, and pain.    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    REHAB POTENTIAL: Good   CLINICAL DECISION MAKING: Evolving/moderate complexity   EVALUATION COMPLEXITY: Moderate     GOALS: Goals reviewed with patient? No   SHORT TERM GOALS: Target date: 10-29-21 Pt will be independent with intial HEP Baseline:no knowledge,  10-07-22 initial HEP given  Goal status: ONGOING   2.  Pt will be able perform STS with  even wt distribution in bil LE Baseline: Pt wt bears to RT in STS Goal status: INITIAL   3.  Pt pain with functional activities in Great toe will be reduced to 5/10  Baseline: 9/10 Goal status: ONGOING     LONG TERM GOALS: Target date: 11-26-22   Pt will be independent with advanced HEP.  Baseline:  Goal status: INITIAL   2.  FOTO will improve from  51%  to   64%  indicating improved functional mobility.  Baseline: eval 51% Goal status: INITIAL   3.  Pain will decrease to 3/10 with all functional activities from 9/10 Baseline: Pt max pain after functional activities. 9/10 Goal status: INITIAL   4.  Pt will be able to perform 6MWT within normal range for age Baseline: TBD Goal status: INITIAL   5.  Pt will be able to perform balance with SLS for at least 30 sec on RT and LT to show improved balance/strength in SL activities Baseline: RT 23 sec  LT 10 sec tries to use UE Goal status: INITIAL   6.  Pt will be able to perform floor to stand transfer to show  increased strength and safe fall preparedness  Baseline: unable to perform Goal status: INITIAL    7. Demonstrate 4 + /5 LT  LE strength to improve stability, safety and endurance of community level function            Baseline: 4-/5             Goal status: INTIAL       PLAN:   PT FREQUENCY: 1-2x/week   PT DURATION: 8 weeks   PLANNED INTERVENTIONS: Therapeutic exercises, Therapeutic activity, Neuromuscular re-education, Balance training, Gait training, Patient/Family education, Self Care, Joint mobilization, Stair training, Dry Needling, Electrical stimulation, Cryotherapy, Moist heat, Taping, Ionotophoresis '4mg'$ /ml Dexamethasone, Manual therapy, and Re-evaluation   PLAN FOR NEXT SESSION: TPDN, Manual, DGI? Response to edema reduction.      [name], PT, [place] Certified Exercise Expert for the Aging Adult  10/08/22 9:48 AM Phone: [phone] Fax: [phone]

## 2022-10-08 NOTE — Therapy (Signed)
OUTPATIENT PHYSICAL THERAPY TREATMENT NOTE   Patient Name: Elizabeth Wang MRN: [id number] DOB:Feb 14, 1946, 76 y.o., female Today's Date: 10/13/2022  PCP: Ginger Organ. MD  REFERRING PROVIDER: Armond Hang, MD    END OF SESSION:   PT End of Session - 10/13/22 1551     Visit Number 4    Number of Visits 16    Date for PT Re-Evaluation 11/26/22    Authorization Type MCR/ BCBS  6th and 10th FOTO  10th note progress note    Progress Note Due on Visit 10    PT Start Time 1550    PT Stop Time 1630    PT Time Calculation (min) 40 min    Activity Tolerance Patient tolerated treatment well    Behavior During Therapy [facility] for tasks assessed/performed               Past Medical History:  Diagnosis Date   Acute pulmonary embolism ([place]) 06/2013   Arthritis    Asthma    years ago    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   Paroxysmal atrial fibrillation ([facility])    1 isolated episode in the setting of acute stress with no further episodes.    Pseudogout    Past Surgical History:  Procedure Laterality Date   BREAST RECONSTRUCTION     CARDIOVERSION N/A 06/27/2020   Procedure: CARDIOVERSION;  Surgeon: [name], MD;  Location: [facility];  Service: Cardiovascular;  Laterality: N/A;   CESAREAN SECTION     COLONOSCOPY  2004   repeat in ten years   KNEE ARTHROSCOPY Left    TONSILLECTOMY     TOTAL KNEE ARTHROPLASTY Right 01/01/2014   Procedure: TOTAL RIGHT KNEE ARTHROPLASTY;  Surgeon: [name], MD;  Location: [facility];  Service: Orthopedics;  Laterality: Right;   TOTAL KNEE ARTHROPLASTY Left 07/23/2014   Procedure: LEFT TOTAL KNEE ARTHROPLASTY;  Surgeon: [name], MD;  Location: [facility];  Service: Orthopedics;   Laterality: Left;   tummy tuck  1987   WISDOM TOOTH EXTRACTION     Patient Active Problem List   Diagnosis Date Noted   Gout 01/12/2014   Constipation 01/12/2014   S/P total knee arthroplasty 01/11/2014   Hypokalemia 01/03/2014   Hyponatremia 01/03/2014   Postoperative anemia due to acute blood loss 01/02/2014   Hypothyroidism 01/02/2014   Esophageal reflux 01/02/2014   Other and unspecified hyperlipidemia 01/02/2014   OA (osteoarthritis) of knee 01/01/2014   Essential hypertension, benign 11/28/2013   Pulmonary embolism ([place]) 11/28/2013   DVT (deep venous thrombosis) ([place]) 08/09/2013   Other pulmonary embolism and infarction 08/09/2013   Persistent atrial fibrillation ([place]) 07/14/2013   Knee pain, bilateral 07/14/2013    REFERRING DIAG: M25.572 (ICD-10-CM) - Pain in left ankle and joints of left foot    THERAPY DIAG:  Stiffness of left ankle, not elsewhere classified  Difficulty in walking, not elsewhere classified  Pain of left great toe  Rationale for Evaluation and Treatment Rehabilitation  PERTINENT HISTORY: 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 I am not sure how to use the app. On my phone   PAIN: 10-08-22  0/10 pain Are you having pain? Yes: NPRS scale: 0/10 Pain location: great toe LT,  unable to descend 1st ray Pain description: dull ache Aggravating factors: walking on it when I walk > 7000 steps,  just living,  sometimes just throbbing, steps Relieving factors: wearing a toe pad, salonpas topical No pain in my left ankle, uncomfortable because it does not  bend   OBJECTIVE: (objective measures completed at initial evaluation unless otherwise dated)   DIAGNOSTIC FINDINGS: x rays recently taken but not available at [place]:  FOTO 51%  predicted 64%   COGNITION: Overall cognitive status: Within functional limits for tasks assessed                         SENSATION: WFL   EDEMA:  Figure 8: RT 54 cm, LT 61 cm   MUSCLE LENGTH: Hamstrings: Right 53 deg; Left 50 deg     POSTURE: rounded shoulders, forward head, flexed trunk , and obesity   PALPATION: Not pain but increased stiffness / blocked talar glide   LOWER EXTREMITY ROM:   Active ROM Right eval Left eval  Hip flexion      Hip extension      Hip abduction      Hip adduction      Hip internal rotation      Hip external rotation      Knee flexion      Knee extension      Ankle dorsiflexion 5 +9 from neutral  Ankle plantarflexion 65 51  Ankle inversion 27 14  Ankle eversion 20 14  Great toe flexion 50 25  Great toe extension 45 10   (Blank rows = not tested)   LOWER EXTREMITY MMT:   MMT Right eval Left eval  Hip flexion 4+ 4  Hip extension 4 4-  Hip abduction 4- 4-  Hip adduction      Hip internal rotation      Hip external rotation      Knee flexion      Knee extension 5 4  Ankle dorsiflexion 5 4  Ankle plantarflexion      Ankle inversion 4+ 4-  Ankle eversion 4+ 4-   (  Blank rows = not tested)       FUNCTIONAL TESTS:  5 times sit to stand: 11.15 2 minute walk test: TBD            Sustained bridge  32 seconds             SLS RT 23 sec  LT 10sec but pain   [facility] Signs Eval (12-7- 2023)            5 x  STS 11.73 sec but wt bears to RT            Max pain LT great toe 9/10             6MWT               sustained bridge  32 sec              SLS RT 23/LT 10 sec             DF LT            GAIT: Distance walked: 150 Assistive device utilized: None Level of assistance: Complete Independence Comments: antalgic gait left,  with trunk lean to RT Pt with hammer toes and ulcer on LT great toe healing , with an extension pad.  Pt is unable to depress 1st ray with gait in stance/ push off phase, Pt tends to toe grip with shoes and is wearing more sneakers rather than sandals presently.    TODAY'S TREATMENT:  [facility] Adult PT Treatment:                                                DATE: 10-13-22 Therapeutic Exercise: STS with GTB around thighs 3 x 10 with 10 # DB  Inversion of LT foot with GTB 3 x 10  Seated Heel raises BIL 3 x 10 with 10# DB Seated calf stretch Toe yoga Manual Therapy: Prone ankle edema reduction massage with knee flexion to maixmize gravity assist. Talocrural distraction grade III-IV with pt LT LE on mat table and then foot stool for wt bearing with body weight and PT providing mobilization Subtalar mobs grade III for both for supination/ pronation MTPR along gastroc/ soleus.  Trigger Point Dry Needling Treatment: Pre-treatment instruction: Patient instructed on dry needling rationale, procedures, and possible side effects including pain during treatment (achy,cramping feeling), bruising, drop of blood, lightheadedness, nausea, sweating. Patient Consent Given: Yes Education handout provided: Yes Muscles treated: LT Gastroc lateral and medial  Needle size and number: .30x67m x 2  along lateral gastroc and medial lines Electrical stimulation performed: No Parameters: N/A Treatment response/outcome: Twitch response elicited and Palpable decrease in muscle tension Post-treatment instructions: Patient instructed to expect possible mild to moderate muscle soreness later today and/or tomorrow. Patient instructed in methods to reduce muscle soreness and to continue prescribed HEP. If patient was dry needled over the lung field, patient was instructed on signs and symptoms of pneumothorax and, however unlikely, to see immediate medical attention should they occur. Patient was also educated on signs and  symptoms of infection and to seek medical attention should they occur. Patient verbalized understanding of these instructions and education.   [facility]  [facility]: [facility]22 Therapeutic Exercise: Seated ankle  heel raise with 10 lb 2 x 10 Towel Inversion/ eversion 2 x 10 ea direction Seated heel slides bil LE squeezing towel between 1st MCP's bil to reduce eversion. Power band around ankle to increase DF with LT foot on footstool with 30 sec holds and brief rest for 5 min Manual Therapy: Prone ankle edema reduction massage with knee flexion to maixmize gravity assist. Talocrural distraction with mob belts grade III-IV Subtalar mobs grade III for both for supination/ pronation MTPR along gastroc/ soleus.   [facility] Adult PT Treatment:                                                DATE: 10/07/2022 Therapeutic Exercise: Seated ankle PF/ DF with iFitter 1 x 15 LLE only 1 x 20 using bil LE Seated ankle supination/ pronation with iFitter 1 x 20 LLE only  Towel Inversion/ eversion 1 x 10 ea direction Seated heel slides bil LE squeezing towel between 1st MCP's bil to reduce eversion.   Manual Therapy: Prone ankle edema reduction massage with knee flexion to maixmize gravity assist. Talocrural distraction with mob belts grade III-IV Subtalar mobs grade III for both for supination/ pronation MTPR along gastroc/ soleus.                                                                                                                   DATE: 10-01-22 eval      PATIENT EDUCATION:  Education details: POC FOTO report, explanation of finds.  Course of care Person educated: Patient Education method: Explanation Education comprehension: verbalized understanding and needs further education   HOME EXERCISE PROGRAM: Access Code: [email or web address] URL: [email or web address] Date: 10/08/2022 Prepared by: [name]  Program Notes Toe  Yoga as shown in clinicDorsiflexion stretch on footstool with power band as shown in clinic.  Buy on [employer] with various strengths  Exercises - Seated Toe Raise  - 1 x daily - 7 x weekly - 2 sets - 10 reps - Ankle Inversion Eversion Towel Slide  - 1 x daily - 7 x weekly - 2 sets - 10 reps - Seated Heel Slide  - 1 x daily - 7 x weekly - 2 sets - 10 reps - Seated Calf Stretch with Strap  - 1 x daily - 7 x weekly - 2 sets - 3 reps - 30 hold   ASSESSMENT:   CLINICAL IMPRESSION: Mrs bullen arrives to PT reporting no pain.  Pt consented to TPDN and  was closely monitored throughout session with increased tissue extensibility.  Worked on edema reduction in prone followed with talocrural and subtalar mobs. She did well with AROM to promote ROM in sitting. Visual she demonstrated increase in DF and reported improvement in ankle motion.  Pt with visible signs of edema reduction.  Pt with crepitus with DF this week. And  visible DF improvement approximately 0 DF   OBJECTIVE IMPAIRMENTS: Abnormal gait, decreased balance, difficulty walking, decreased ROM, decreased strength, hypomobility, obesity, and pain.    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    REHAB POTENTIAL: Good   CLINICAL DECISION MAKING: Evolving/moderate complexity   EVALUATION COMPLEXITY: Moderate     GOALS: Goals reviewed with patient? No   SHORT TERM GOALS: Target date: 10-29-21 Pt will be independent with intial HEP Baseline:no knowledge,  10-07-22 initial HEP given  Goal status: ONGOING   2.  Pt will be able perform STS with even wt distribution in bil  LE Baseline: Pt wt bears to RT in STS Goal status: ONGOING   3.  Pt pain with functional activities in Great toe will be reduced to 5/10  Baseline: 9/10 Goal status: ONGOING     LONG TERM GOALS: Target date: 11-26-22   Pt will be independent with advanced HEP.  Baseline:  Goal status: INITIAL   2.  FOTO will improve from  51%  to   64%  indicating improved functional mobility.  Baseline: eval 51% Goal status: INITIAL   3.  Pain will decrease to 3/10 with all functional activities from 9/10 Baseline: Pt max pain after functional activities. 9/10 Goal status: INITIAL   4.  Pt will be able to perform 6MWT within normal range for age Baseline: TBD Goal status: INITIAL   5.  Pt will be able to perform balance with SLS for at least 30 sec on RT and LT to show improved balance/strength in SL activities Baseline: RT 23 sec  LT 10 sec tries to use UE Goal status: INITIAL   6.  Pt will be able to perform floor to stand transfer to show  increased strength and safe fall preparedness  Baseline: unable to perform Goal status: INITIAL    7. Demonstrate 4 + /5 LT LE strength to improve stability, safety and endurance of community level function            Baseline: 4-/5             Goal status: INTIAL       PLAN:   PT FREQUENCY: 1-2x/week   PT DURATION: 8 weeks   PLANNED INTERVENTIONS: Therapeutic exercises, Therapeutic activity, Neuromuscular re-education, Balance training, Gait training, Patient/Family education, Self Care, Joint mobilization, Stair training, Dry Needling, Electrical stimulation, Cryotherapy, Moist heat, Taping, Ionotophoresis '4mg'$ /ml Dexamethasone, Manual therapy, and Re-evaluation   PLAN FOR NEXT SESSION: TPDN, Manual, DGI? Response to edema reduction.   Goals, LLD measure  PPT tennis ball exericise, AROM measure next visit    [name], PT, [facility] Certified Exercise Expert for the Aging Adult  10/13/22 4:47 PM Phone: [phone] Fax: [phone]

## 2022-10-12 ENCOUNTER — Ambulatory Visit: Payer: Medicare Other | Admitting: Podiatry

## 2022-10-13 ENCOUNTER — Ambulatory Visit: Payer: Medicare Other | Admitting: Physical Therapy

## 2022-10-13 DIAGNOSIS — R262 Difficulty in walking, not elsewhere classified: Secondary | ICD-10-CM | POA: Diagnosis not present

## 2022-10-13 DIAGNOSIS — M79675 Pain in left toe(s): Secondary | ICD-10-CM | POA: Diagnosis not present

## 2022-10-13 DIAGNOSIS — M25672 Stiffness of left ankle, not elsewhere classified: Secondary | ICD-10-CM

## 2022-10-13 DIAGNOSIS — S92514A Nondisplaced fracture of proximal phalanx of right lesser toe(s), initial encounter for closed fracture: Secondary | ICD-10-CM | POA: Diagnosis not present

## 2022-10-13 NOTE — Patient Instructions (Signed)

## 2022-10-14 NOTE — Therapy (Signed)
OUTPATIENT PHYSICAL THERAPY TREATMENT NOTE   Patient Name: Elizabeth Wang MRN: [id number] DOB:1946/08/27, 76 y.o., female Today's Date: 10/15/2022  PCP: Elizabeth Wang. Wang  REFERRING PROVIDER: Armond Hang, Wang    END OF SESSION:   PT End of Session - 10/15/22 1021     Visit Number 5    Number of Visits 16    Date for PT Re-Evaluation 11/26/22    Authorization Type MCR/ BCBS  6th and 10th FOTO  10th note progress note    PT Start Time 1022    PT Stop Time 1100    PT Time Calculation (min) 38 min                Past Medical History:  Diagnosis Date   Acute pulmonary embolism ([place]) 06/2013   Arthritis    Asthma    years ago    Clotting disorder ([place])    DVT/PE   Diverticulosis    minor    Dyslipidemia    Esophageal reflux    Hypertension    Hypothyroidism    Numbness    TOES   Obesity    OSA (obstructive sleep apnea)    severe OSA with an AHI of 51/hr and nocturnal hypoxemia with O2 sats at low as 69%.   Paroxysmal atrial fibrillation ([facility])    1 isolated episode in the setting of acute stress with no further episodes.    Pseudogout    Past Surgical History:  Procedure Laterality Date   BREAST RECONSTRUCTION     CARDIOVERSION N/A 06/27/2020   Procedure: CARDIOVERSION;  Surgeon: Elizabeth Wang;  Location: [facility];  Service: Cardiovascular;  Laterality: N/A;   CESAREAN SECTION     COLONOSCOPY  2004   repeat in ten years   KNEE ARTHROSCOPY Left    TONSILLECTOMY     TOTAL KNEE ARTHROPLASTY Right 01/01/2014   Procedure: TOTAL RIGHT KNEE ARTHROPLASTY;  Surgeon: Elizabeth Wang;  Location: [facility];  Service: Orthopedics;  Laterality: Right;   TOTAL KNEE ARTHROPLASTY Left 07/23/2014   Procedure: LEFT TOTAL KNEE ARTHROPLASTY;  Surgeon: Elizabeth Wang;  Location: [facility];  Service: Orthopedics;  Laterality: Left;   tummy tuck  1987   WISDOM TOOTH EXTRACTION     Patient Active Problem List   Diagnosis Date Noted   Gout 01/12/2014    Constipation 01/12/2014   S/P total knee arthroplasty 01/11/2014   Hypokalemia 01/03/2014   Hyponatremia 01/03/2014   Postoperative anemia due to acute blood loss 01/02/2014   Hypothyroidism 01/02/2014   Esophageal reflux 01/02/2014   Other and unspecified hyperlipidemia 01/02/2014   OA (osteoarthritis) of knee 01/01/2014   Essential hypertension, benign 11/28/2013   Pulmonary embolism ([place]) 11/28/2013   DVT (deep venous thrombosis) ([place]) 08/09/2013   Other pulmonary embolism and infarction 08/09/2013   Persistent atrial fibrillation ([place]) 07/14/2013   Knee pain, bilateral 07/14/2013    REFERRING DIAG: M25.572 (ICD-10-CM) - Pain in left ankle and joints of left foot    THERAPY DIAG:  Stiffness of left ankle, not elsewhere classified  Difficulty in walking, not elsewhere classified  Rationale for Evaluation and Treatment Rehabilitation  PERTINENT HISTORY: 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   PAIN:   10-15-22 Great toe LT 3/10, 4-5/10 ankle LT 10-08-22  0/10 pain Are you having pain? Yes: NPRS scale: 0/10 Pain location: great toe LT,  unable to descend 1st ray Pain description: dull ache Aggravating factors: walking on it when I walk > 7000 steps,  just living,  sometimes just throbbing, steps Relieving factors: wearing a toe pad, salonpas topical No pain in my left ankle, uncomfortable because it does not bend   OBJECTIVE: (objective measures completed at initial  evaluation unless otherwise dated)   DIAGNOSTIC FINDINGS: x rays recently taken but not available at [facility]:  FOTO 51%  predicted 64%   COGNITION: Overall cognitive status: Within functional limits for tasks assessed                         SENSATION: WFL   EDEMA:  Figure 8: RT 54 cm, LT 61 cm 10-15-22 10:30 AM  LT 59.5 cm   MUSCLE LENGTH: Hamstrings: Right 53 deg; Left 50 deg  10-15-22 LLD 31 in ASIS to malleolus LT  RT 31.75 inch     POSTURE: rounded shoulders, forward head, flexed trunk , and obesity   PALPATION: Not pain but increased stiffness / blocked talar glide   LOWER EXTREMITY ROM:   Active ROM Right eval Left eval LT 10-15-22  Hip flexion       Hip extension       Hip abduction       Hip adduction       Hip internal rotation       Hip external rotation       Knee flexion       Knee extension       Ankle dorsiflexion 5 +9 from neutral 0  Ankle plantarflexion 65 51   Ankle inversion 27 14   Ankle eversion 20 14   Great toe flexion 50 25   Great toe extension 45 10    (Blank rows = not tested)   LOWER EXTREMITY MMT:   MMT Right eval Left eval  Hip flexion 4+ 4  Hip extension 4 4-  Hip abduction 4- 4-  Hip adduction      Hip internal rotation      Hip external rotation      Knee flexion      Knee extension 5 4  Ankle dorsiflexion 5 4  Ankle plantarflexion      Ankle inversion 4+ 4-  Ankle eversion 4+ 4-   (Blank rows = not tested)       FUNCTIONAL TESTS:  5 times sit to stand: 11.15 2 minute walk test: TBD            Sustained bridge  32 seconds             SLS RT 23 sec  LT 10sec but pain   [facility] Signs Eval (12-7- 2023) 10-15-22           5 x  STS 11.73 sec but wt bears to RT 7.66 ft wt bears to RT           Max pain LT great toe 9/10 3/10            6MWT   824 ft  1290-1755 ft           sustained bridge  32 sec              SLS RT 23/LT 10 sec  RT 23 LT 7 sec           DF LT  DF 0 LT           GAIT: Distance walked: 150 Assistive device utilized: None Level of assistance: Complete Independence Comments: antalgic gait left, with trunk lean to RT Pt with hammer toes and ulcer on LT great toe healing , with an extension pad.  Pt is unable to depress 1st ray with gait in stance/ push off phase, Pt tends to toe grip with shoes and is wearing more sneakers rather than sandals presently.    TODAY'S TREATMENT:  [facility] Adult PT Treatment:                                                DATE: 10-15-22  Perfomance testing - 6MWT 824 ft (1290 to 1755 ft norm) 7.65 5 x sts Goals Therapeutic Exercise: STS with 10 lb DB  3 x 10 Standing 3-Way Leg Reach with GTB Counter Support   3 x 10 PT assist with Runner's stretch on stool for increased DF   Manual Therapy: Prone ankle edema reduction massage with knee flexion to maixmize gravity assist. Talocrural distraction grade III-IV with pt LT LE on mat table and then foot stool for wt bearing with body weight and PT providing mobilization Subtalar mobs grade III for both for supination/ pronation MTPR along gastroc/ soleus.  Trigger Point Dry Needling Treatment: Pre-treatment instruction: Patient instructed on dry needling rationale, procedures, and possible side effects including pain during treatment (achy,cramping feeling), bruising, drop of blood, lightheadedness, nausea, sweating. Patient Consent Given: Yes Education handout provided: Yes Muscles treated: LT Gastroc lateral and medial  Needle size and number: .30x43m x 2  along lateral gastroc and medial lines Electrical stimulation performed: No Parameters: N/A Treatment response/outcome: Twitch response elicited and Palpable decrease in muscle tension Post-treatment instructions: Patient instructed to expect possible mild to moderate muscle soreness later today and/or tomorrow. Patient instructed in methods to reduce muscle soreness and to continue prescribed HEP. Patient was also  educated on signs and symptoms of infection and to seek medical attention should they occur. Patient verbalized understanding of these instructions and education.   [facility]                                                [facility]: 10-13-22 Therapeutic Exercise: STS with GTB around thighs 3 x 10 with 10 # DB  Inversion of LT foot with GTB 3 x 10  Seated Heel raises BIL 3 x 10 with 10# DB Seated calf stretch Toe yoga Manual Therapy: Prone ankle edema reduction massage with knee flexion to maixmize gravity assist. Talocrural distraction grade III-IV with pt LT LE on mat table and then foot stool for wt bearing with body weight and PT providing mobilization Subtalar mobs grade III for both for supination/ pronation MTPR along gastroc/ soleus.  Trigger Point Dry Needling Treatment: Pre-treatment instruction: Patient instructed on dry needling rationale, procedures, and possible side  effects including pain during treatment (achy,cramping feeling), bruising, drop of blood, lightheadedness, nausea, sweating. Patient Consent Given: Yes Education handout provided: Yes Muscles treated: LT Gastroc lateral and medial  Needle size and number: .30x48m x 2  along lateral gastroc and medial lines Electrical stimulation performed: No Parameters: N/A Treatment response/outcome: Twitch response elicited and Palpable decrease in muscle tension Post-treatment instructions: Patient instructed to expect possible mild to moderate muscle soreness later today and/or tomorrow. Patient instructed in methods to reduce muscle soreness and to continue prescribed HEP. If patient was dry needled over the lung field, patient was instructed on signs and symptoms of pneumothorax and, however unlikely, to see immediate medical attention should they occur. Patient was also educated on signs and symptoms of infection and to seek medical attention should they occur. Patient verbalized understanding of these instructions  and education.   [facility]                                                DATE: 10-08-22 Therapeutic Exercise: Seated ankle  heel raise with 10 lb 2 x 10 Towel Inversion/ eversion 2 x 10 ea direction Seated heel slides bil LE squeezing towel between 1st MCP's bil to reduce eversion. Power band around ankle to increase DF with LT foot on footstool with 30 sec holds and brief rest for 5 min Manual Therapy: Prone ankle edema reduction massage with knee flexion to maixmize gravity assist. Talocrural distraction with mob belts grade III-IV Subtalar mobs grade III for both for supination/ pronation MTPR along gastroc/ soleus.   [facility]                                                DATE: 10/07/2022 Therapeutic Exercise: Seated ankle PF/ DF with iFitter 1 x 15 LLE only 1 x 20 using bil LE Seated ankle supination/ pronation with iFitter 1 x 20 LLE only  Towel Inversion/ eversion 1 x 10 ea direction Seated heel slides bil LE squeezing towel between 1st MCP's bil to reduce eversion.   Manual Therapy: Prone ankle edema reduction massage with knee flexion to maixmize gravity assist. Talocrural distraction with mob belts grade III-IV Subtalar mobs grade III for both for supination/ pronation MTPR along gastroc/ soleus.                                                                                                                   DATE: 10-01-22 eval      PATIENT EDUCATION:  Education details: POC FOTO report, explanation of finds.  Course of care Person educated: Patient Education method: Explanation Education comprehension: verbalized understanding and needs further education   HOME EXERCISE PROGRAM:   Access Code: [email or web address] URL: [email or web address] Date:  10/15/2022 Prepared by: [name]  Program Notes Toe Yoga as shown in clinicDorsiflexion stretch on footstool with power band as shown in clinic.  Buy on [employer] with  various strengths  Exercises - Seated Toe Raise  - 1 x daily - 7 x weekly - 2 sets - 10 reps - Ankle Inversion Eversion Towel Slide  - 1 x daily - 7 x weekly - 2 sets - 10 reps - Seated Heel Slide  - 1 x daily - 7 x weekly - 2 sets - 10 reps - Seated Calf Stretch with Strap  - 1 x daily - 7 x weekly - 2 sets - 3 reps - 30 hold - Standing 3-Way Leg Reach with Resistance at Ankles and Counter Support  - 1 x daily - 7 x weekly - 3 sets - 10 reps - Sit to stand with sink support Movement snack  - 1 x daily - 7 x weekly - 3 sets - 10 reps ASSESSMENT:   CLINICAL IMPRESSION: Elizabeth Wang arrives to PT reporting reduced pain in RT great toe 3/10 and overal pain in ankle 4-5/10.  Pt LLD with LT LE shorter functionally than RT and given heel lift.  Attempted to use in shoe but increased toe gripping with slip on shoes.  Pt will try at home in better accommodating shoe.  Pt consented to TPDN and  was closely monitored throughout session with increased tissue extensibility.  Worked on edema reduction in prone followed with talocrural and subtalar mobs. All STG met today.  6 MWT 824 ft, 5 x sts 7.66 sec with RT wt bearing.   Pt with crepitus with DF this week. And visible DF improvement approximately 0 DF   OBJECTIVE IMPAIRMENTS: Abnormal gait, decreased balance, difficulty walking, decreased ROM, decreased strength, hypomobility, obesity, and pain.    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    REHAB POTENTIAL: Good   CLINICAL DECISION MAKING: Evolving/moderate complexity   EVALUATION COMPLEXITY: Moderate     GOALS: Goals reviewed with patient?  No   SHORT TERM GOALS: Target date: 10-29-21 Pt will be independent with intial HEP Baseline:no knowledge,  10-07-22 initial HEP given  Goal status: MET   2.  Pt will be able perform STS with even wt distribution in bil LE Baseline: Pt wt bears to RT in STS Goal status: MET   3.  Pt pain with functional activities in Great toe will be reduced to 5/10  Baseline: 9/10 10-15-22  3/10 Goal status: MET     LONG TERM GOALS: Target date: 11-26-22   Pt will be independent with advanced HEP.  Baseline:  Goal status: INITIAL   2.  FOTO will improve from  51%  to   64%  indicating improved functional mobility.  Baseline: eval 51% Goal status: INITIAL   3.  Pain will decrease to 3/10 with all functional activities from 9/10 Baseline: Pt max pain after functional activities. 9/10 Goal status: INITIAL   4.  Pt will be able to perform 6MWT within normal range for age Baseline: TBD Goal status: INITIAL   5.  Pt will be able to perform balance with SLS for at least 30 sec on RT and LT to show improved  balance/strength in SL activities Baseline: RT 23 sec  LT 10 sec tries to use UE Goal status: INITIAL   6.  Pt will be able to perform floor to stand transfer to show  increased strength and safe fall preparedness  Baseline: unable to perform Goal status: INITIAL    7. Demonstrate 4 + /5 LT LE strength to improve stability, safety and endurance of community level function            Baseline: 4-/5             Goal status: INITIAL       PLAN:   PT FREQUENCY: 1-2x/week   PT DURATION: 8 weeks   PLANNED INTERVENTIONS: Therapeutic exercises, Therapeutic activity, Neuromuscular re-education, Balance training, Gait training, Patient/Family education, Self Care, Joint mobilization, Stair training, Dry Needling, Electrical stimulation, Cryotherapy, Moist heat, Taping, Ionotophoresis 69m/ml Dexamethasone, Manual therapy, and Re-evaluation   PLAN FOR NEXT SESSION: TPDN, Manual, DGI? Response to  edema reduction.   Goals, LLD measure  PPT tennis ball exericise, AROM measure next visit    LVoncille Lo PT, ABrownwood Regional Medical CenterCertified Exercise Expert for the Aging Adult  10/15/22 11:46 AM Phone: [phone]: [phone]

## 2022-10-15 ENCOUNTER — Ambulatory Visit: Payer: Medicare Other | Admitting: Physical Therapy

## 2022-10-15 ENCOUNTER — Encounter: Payer: Self-pay | Admitting: Physical Therapy

## 2022-10-15 DIAGNOSIS — M79675 Pain in left toe(s): Secondary | ICD-10-CM | POA: Diagnosis not present

## 2022-10-15 DIAGNOSIS — M25672 Stiffness of left ankle, not elsewhere classified: Secondary | ICD-10-CM | POA: Diagnosis not present

## 2022-10-15 DIAGNOSIS — R262 Difficulty in walking, not elsewhere classified: Secondary | ICD-10-CM | POA: Diagnosis not present

## 2022-10-15 NOTE — Therapy (Signed)
OUTPATIENT PHYSICAL THERAPY TREATMENT NOTE   Patient Name: Elizabeth Wang MRN: [id number] DOB:Oct 06, 1946, 76 y.o., female Today's Date: 10/20/2022  PCP: Ginger Organ. MD  REFERRING PROVIDER: Armond Hang, MD    END OF SESSION:   PT End of Session - 10/20/22 1148     Visit Number 6    Number of Visits 16    Date for PT Re-Evaluation 11/26/22    Authorization Type MCR/ BCBS  6th and 10th FOTO  10th note progress note    PT Start Time 1145    PT Stop Time 1230    PT Time Calculation (min) 45 min    Activity Tolerance Patient tolerated treatment well    Behavior During Therapy WFL for tasks assessed/performed                 Past Medical History:  Diagnosis Date   Acute pulmonary embolism ([place]) 06/2013   Arthritis    Asthma    years ago    Clotting disorder ([place])    DVT/PE   Diverticulosis    minor    Dyslipidemia    Esophageal reflux    Hypertension    Hypothyroidism    Numbness    TOES   Obesity    OSA (obstructive sleep apnea)    severe OSA with an AHI of 51/hr and nocturnal hypoxemia with O2 sats at low as 69%.   Paroxysmal atrial fibrillation ([facility])    1 isolated episode in the setting of acute stress with no further episodes.    Pseudogout    Past Surgical History:  Procedure Laterality Date   BREAST RECONSTRUCTION     CARDIOVERSION N/A 06/27/2020   Procedure: CARDIOVERSION;  Surgeon: [name], MD;  Location: [facility];  Service: Cardiovascular;  Laterality: N/A;   CESAREAN SECTION     COLONOSCOPY  2004   repeat in ten years   KNEE ARTHROSCOPY Left    TONSILLECTOMY     TOTAL KNEE ARTHROPLASTY Right 01/01/2014   Procedure: TOTAL RIGHT KNEE ARTHROPLASTY;  Surgeon: [name], MD;  Location: [facility];  Service: Orthopedics;  Laterality: Right;   TOTAL KNEE ARTHROPLASTY Left 07/23/2014   Procedure: LEFT TOTAL KNEE ARTHROPLASTY;  Surgeon: [name], MD;  Location: [facility];  Service: Orthopedics;  Laterality: Left;   tummy tuck   1987   WISDOM TOOTH EXTRACTION     Patient Active Problem List   Diagnosis Date Noted   Gout 01/12/2014   Constipation 01/12/2014   S/P total knee arthroplasty 01/11/2014   Hypokalemia 01/03/2014   Hyponatremia 01/03/2014   Postoperative anemia due to acute blood loss 01/02/2014   Hypothyroidism 01/02/2014   Esophageal reflux 01/02/2014   Other and unspecified hyperlipidemia 01/02/2014   OA (osteoarthritis) of knee 01/01/2014   Essential hypertension, benign 11/28/2013   Pulmonary embolism ([place]) 11/28/2013   DVT (deep venous thrombosis) ([place]) 08/09/2013   Other pulmonary embolism and infarction 08/09/2013   Persistent atrial fibrillation ([place]) 07/14/2013   Knee pain, bilateral 07/14/2013    REFERRING DIAG: M25.572 (ICD-10-CM) - Pain in left ankle and joints of left foot    THERAPY DIAG:  Stiffness of left ankle, not elsewhere classified  Difficulty in walking, not elsewhere classified  Pain of left great toe  Rationale for Evaluation and Treatment Rehabilitation  PERTINENT HISTORY: 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 Haven't had a chance to use the heel lift due to Christmas.   PAIN:  10-20-22 Ankle 2/10,  Toe 9/10 pain. 10-15-22 Great toe LT 3/10, 4-5/10 ankle LT 10-08-22  0/10 pain Are you having pain? Yes: NPRS scale: 0/10 Pain location: great toe LT,  unable to descend 1st ray Pain description: dull ache Aggravating factors: walking on it when I walk > 7000 steps,  just living,  sometimes just throbbing, steps Relieving factors: wearing a toe pad, salonpas topical No pain in my  left ankle, uncomfortable because it does not bend   OBJECTIVE: (objective measures completed at initial evaluation unless otherwise dated)   DIAGNOSTIC FINDINGS: x rays recently taken but not available at [facility]:  FOTO 51%  predicted 64% 10-20-22 73%   COGNITION: Overall cognitive status: Within functional limits for tasks assessed                         SENSATION: WFL   EDEMA:  Figure 8: RT 54 cm, LT 61 cm 10-15-22 10:30 AM  LT 59.5 cm   MUSCLE LENGTH: Hamstrings: Right 53 deg; Left 50 deg  10-15-22 LLD 31 in ASIS to malleolus LT  RT 31.75 inch     POSTURE: rounded shoulders, forward head, flexed trunk , and obesity   PALPATION: Not pain but increased stiffness / blocked talar glide   LOWER EXTREMITY ROM:   Active ROM Right eval Left eval LT 10-15-22 10-20-22 LT  Hip flexion        Hip extension        Hip abduction        Hip adduction        Hip internal rotation        Hip external rotation        Knee flexion        Knee extension        Ankle dorsiflexion 5 +9 from neutral 0 0  Ankle plantarflexion 65 51  55  Ankle inversion _0 Ankle eversion _1 Great toe distal flexion 50 25  39  Great toe  extension 45 10  10   (Blank rows = not tested)   LOWER EXTREMITY MMT:   MMT Right eval Left eval RT/LT 10-20-22  Hip flexion 4+ 4 4+/4  Hip extension 4 4- 4/4  Hip abduction 4- 4- 4-/4-  Hip adduction       Hip internal rotation       Hip external rotation       Knee flexion       Knee extension 5 4 5/4+  Ankle dorsiflexion 5 4 5/4-  Ankle plantarflexion       Ankle inversion 4+ 4- 4+/4  Ankle eversion 4+ 4- 4+/4   (Blank rows = not tested)       FUNCTIONAL TESTS:  5 times sit to stand: 11.15 6 MWT 12-21-23824 ft 1290-1755 ft            Sustained bridge  32 seconds             SLS RT 23 sec  LT 10sec but pain   [facility] Signs Eval (12-7- 2023) 10-15-22  10-20-22         5 x  STS 11.73 sec but wt bears to  RT 7.66 sec wt bears to RT   9.66 sec with  equal wt bear        Max pain LT great toe 9/10 3/10  3/10 most of the time but occassionally 9/10 as in today         6MWT   824 ft 1290-1755 ft NT          sustained bridge  32 sec    Cramping in hamstring NT          SLS RT 23/LT 10 sec  RT 23 LT 7 sec  RT < 3  LT < 3 checked with and without shoes        DF LT  DF 0 LT DF- 0 LT         GAIT: Distance walked: 150 Assistive device utilized: None Level of assistance: Complete Independence Comments: antalgic gait left, with trunk lean to RT Pt with hammer toes and ulcer on LT great toe healing , with an extension pad.  Pt is unable to depress 1st ray with gait in stance/ push off phase, Pt tends to toe grip with shoes and is wearing more sneakers rather than sandals presently.    TODAY'S TREATMENT:  [facility] Adult PT Treatment:                                                DATE: 10-20-22 Therapeutic Exercise: STS with 10 lb x 2 (total 20) DB  3 x 10 Sitting inversion with GTB 3 x 10 Heel raises BIL 20 x Seated toe raise and heel raise Prone quad stretch with strap 30-60 sec holds on RT and then LT Neuromuscular re-ed: Single limb with UE support for 5 sec or less Tandem stance Manual Therapy: Talocrural distraction grade III-IV with pt LT LE with pt in prone Subtalar mobs grade III for both for supination/ pronation in prone MTPR along gastroc/ soleus.  Prone ankle edema reduction massage with knee flexion to maixmize gravity assist. Talocrural distraction grade III-IV with pt LT LE on mat table and then foot stool for wt bearing with body weight and PT providing mobilization Subtalar mobs grade III for both for supination/ pronation MTPR along gastroc/ soleus.  Trigger Point Dry Needling Treatment: Pre-treatment instruction: Patient instructed on dry needling rationale, procedures, and possible side effects including pain during treatment (achy,cramping feeling), bruising, drop of blood,  lightheadedness, nausea, sweating. Patient Consent Given: Yes Education handout provided: Yes Muscles treated: LT Gastroc lateral and medial  Needle size and number: .30x49m x 2  along lateral gastroc and medial lines Electrical stimulation performed: No Parameters: N/A Treatment response/outcome: Twitch response elicited and Palpable decrease in muscle tension Post-treatment instructions: Patient instructed to expect possible mild to moderate muscle soreness later today and/or tomorrow. Patient instructed in methods to reduce muscle soreness and to continue prescribed HEP. Patient was also educated on signs and symptoms of infection and to seek medical attention should they occur. Patient verbalized understanding of these instructions and education.   [facility]                                                [facility]: [facility]  Perfomance testing - 6MWT 824 ft (1290 to 1755 ft norm) 7.65 5 x sts Goals Therapeutic Exercise:  STS with 10 lb DB  3 x 10 Standing 3-Way Leg Reach with GTB Counter Support   3 x 10 PT assist with Runner's stretch on stool for increased DF   Manual Therapy: Prone ankle edema reduction massage with knee flexion to maixmize gravity assist. Talocrural distraction grade III-IV with pt LT LE on mat table and then foot stool for wt bearing with body weight and PT providing mobilization Subtalar mobs grade III for both for supination/ pronation MTPR along gastroc/ soleus.  Trigger Point Dry Needling Treatment: Pre-treatment instruction: Patient instructed on dry needling rationale, procedures, and possible side effects including pain during treatment (achy,cramping feeling), bruising, drop of blood, lightheadedness, nausea, sweating. Patient Consent Given: Yes Education handout provided: Yes Muscles treated: LT Gastroc lateral and medial  Needle size and number: .30x71m x 2  along lateral gastroc and medial lines Electrical stimulation performed:  No Parameters: N/A Treatment response/outcome: Twitch response elicited and Palpable decrease in muscle tension Post-treatment instructions: Patient instructed to expect possible mild to moderate muscle soreness later today and/or tomorrow. Patient instructed in methods to reduce muscle soreness and to continue prescribed HEP. Patient was also educated on signs and symptoms of infection and to seek medical attention should they occur. Patient verbalized understanding of these instructions and education.   [facility]                                                [facility]: 10[facility]22 Therapeutic Exercise: STS with GTB around thighs 3 x 10 with 10 # DB  Inversion of LT foot with GTB 3 x 10  Seated Heel raises BIL 3 x 10 with 10# DB Seated calf stretch Toe yoga Manual Therapy: Prone ankle edema reduction massage with knee flexion to maixmize gravity assist. Talocrural distraction grade III-IV with pt LT LE on mat table and then foot stool for wt bearing with body weight and PT providing mobilization Subtalar mobs grade III for both for supination/ pronation MTPR along gastroc/ soleus.  Trigger Point Dry Needling Treatment: Pre-treatment instruction: Patient instructed on dry needling rationale, procedures, and possible side effects including pain during treatment (achy,cramping feeling), bruising, drop of blood, lightheadedness, nausea, sweating. Patient Consent Given: Yes Education handout provided: Yes Muscles treated: LT Gastroc lateral and medial  Needle size and number: .30x579mx 2  along lateral gastroc and medial lines Electrical stimulation performed: No Parameters: N/A Treatment response/outcome: Twitch response elicited and Palpable decrease in muscle tension Post-treatment instructions: Patient instructed to expect possible mild to moderate muscle soreness later today and/or tomorrow. Patient instructed in methods to reduce muscle soreness and to continue prescribed HEP. If  patient was dry needled over the lung field, patient was instructed on signs and symptoms of pneumothorax and, however unlikely, to see immediate medical attention should they occur. Patient was also educated on signs and symptoms of infection and to seek medical attention should they occur. Patient verbalized understanding of these instructions and education.   [facility]                                                DATE: 10-[facility]-22 Therapeutic Exercise: Seated ankle  heel raise with 10 lb 2 x 10  Towel Inversion/ eversion 2 x 10 ea direction Seated heel slides bil LE squeezing towel between 1st MCP's bil to reduce eversion. Power band around ankle to increase DF with LT foot on footstool with 30 sec holds and brief rest for 5 min Manual Therapy: Prone ankle edema reduction massage with knee flexion to maixmize gravity assist. Talocrural distraction with mob belts grade III-IV Subtalar mobs grade III for both for supination/ pronation MTPR along gastroc/ soleus.   [facility] Adult PT Treatment:                                                DATE: 10/07/2022 Therapeutic Exercise: Seated ankle PF/ DF with iFitter 1 x 15 LLE only 1 x 20 using bil LE Seated ankle supination/ pronation with iFitter 1 x 20 LLE only  Towel Inversion/ eversion 1 x 10 ea direction Seated heel slides bil LE squeezing towel between 1st MCP's bil to reduce eversion.   Manual Therapy: Prone ankle edema reduction massage with knee flexion to maixmize gravity assist. Talocrural distraction with mob belts grade III-IV Subtalar mobs grade III for both for supination/ pronation MTPR along gastroc/ soleus.                                                                                                                   DATE: 10-01-22 eval      PATIENT EDUCATION:  Education details: POC FOTO report, explanation of finds.  Course of care Person educated: Patient Education method: Explanation Education  comprehension: verbalized understanding and needs further education   HOME EXERCISE PROGRAM:  Access Code: [email or web address] URL: [email or web address] Date: 10/20/2022 Prepared by: [name]  Program Notes Toe Yoga as shown in clinicDorsiflexion stretch on footstool with power band as shown in clinic.  Buy on [employer] with various strengths  Exercises - Seated Toe Raise  - 1 x daily - 7 x weekly - 2 sets - 10 reps - Seated Calf Stretch with Strap  - 1 x daily - 7 x weekly - 2 sets - 3 reps - 30 hold - Standing 3-Way Leg Reach with Resistance at Ankles and Counter Support  - 1 x daily - 7 x weekly - 3 sets - 10 reps - Sit to stand with sink support Movement snack  - 1 x daily - 7 x weekly - 3 sets - 10 reps - Single Leg Stance with Support  - 2 x daily - 7 x weekly - 1 sets - 3-5 reps - hold 15 sec hold - Heel raise with counter support and towel under toes  - 1 x daily - 7 x weekly - 3 sets - 10 reps - Seated Figure 4 Ankle Inversion with Resistance  - 1 x daily - 7 x weekly - 3 sets - 10 reps - Seated Ankle Eversion with Resistance  - 1 x daily -  7 x weekly - 3 sets - 10 reps ASSESSMENT:   CLINICAL IMPRESSION: Mrs rochon arrives to PT reporting increased pain in RT great toe 9/10 and overall pain in ankle 2/10.  Pt LLD with LT LE shorter functionally than RT and given heel lift last visit but has not tried at home due to Christmas activities. Pt with decreased balance in and out of shoes today.  Pt with increased ankle motion in left since eval and DF to 0/10 with hard end feel.  Pt consented to TPDN and  was closely monitored throughout session with increased tissue extensibility.  Worked on edema reduction in prone followed with talocrural and subtalar mobs. Working on LTG now since all STG met. 5 x sts 9.66 sec  with equal wt bearing.    Pt with crepitus with DF this week. Pt mentioned needing to make appt to be assessed by [facility] doctor. Will continue toward completion  of  goals. LTG Foto goal met at 73%   OBJECTIVE IMPAIRMENTS: Abnormal gait, decreased balance, difficulty walking, decreased ROM, decreased strength, hypomobility, obesity, and pain.    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    REHAB POTENTIAL: Good   CLINICAL DECISION MAKING: Evolving/moderate complexity   EVALUATION COMPLEXITY: Moderate     GOALS: Goals reviewed with patient? No   SHORT TERM GOALS: Target date: 10-29-21 Pt will be independent with intial HEP Baseline:no knowledge,  10-07-22 initial HEP given  Goal status: MET   2.  Pt will be able perform STS with even wt distribution in bil LE Baseline: Pt wt bears to RT in STS Goal status: MET   3.  Pt pain with functional activities in Great toe will be reduced to 5/10  Baseline: 9/10 10-15-22  3/10 Goal status: MET     LONG TERM GOALS: Target date: 11-26-22   Pt will be independent with advanced HEP.  Baseline:  Goal status: ONGOING   2.  FOTO will improve from  51%  to   64%  indicating improved functional mobility.  Baseline: eval 51% 10-20-22 73% Goal status: MET   3.  Pain will decrease to 3/10 with all functional activities from 9/10 Baseline: Pt max pain after functional activities. 9/10 Goal status: ONGOING   4.  Pt will be able to perform 6MWT within normal range for age Baseline: 824 ft 1290-1755 ft Goal status:ONGOING   5.  Pt will be able to perform balance with SLS for at least 30 sec on RT and LT to show improved balance/strength in SL activities Baseline: RT 23 sec  LT 10 sec tries to use UE 10-20-22 < 3 sec BIL today Goal status: ONGOING    6.  Pt will be able to perform floor to stand transfer to show  increased strength and safe fall preparedness  Baseline: unable to perform Goal status: INITIAL    7. Demonstrate 4 + /5 LT LE strength to improve stability, safety and endurance of community level function            Baseline: 4-/5 10-20-22  See flow chart            Goal status: IONGOING       PLAN:   PT FREQUENCY:  1-2x/week   PT DURATION: 8 weeks   PLANNED INTERVENTIONS: Therapeutic exercises, Therapeutic activity, Neuromuscular re-education, Balance training, Gait training, Patient/Family education, Self Care, Joint mobilization, Stair training, Dry Needling, Electrical stimulation, Cryotherapy, Moist heat, Taping, Ionotophoresis 52m/ml Dexamethasone, Manual therapy, and Re-evaluation   PLAN FOR NEXT SESSION: TPDN, Manual, DGI? Response to edema reduction.   Goals, LLD measure  PPT tennis ball exericise, AROM measure next visit    LVoncille Lo PT, ANea [facility] Memorial HealthCertified Exercise Expert for the Aging Adult  10/20/22 1:19 PM Phone: [phone]: [phone]

## 2022-10-20 ENCOUNTER — Encounter: Payer: Self-pay | Admitting: Physical Therapy

## 2022-10-20 ENCOUNTER — Ambulatory Visit: Payer: Medicare Other | Admitting: Physical Therapy

## 2022-10-20 DIAGNOSIS — M79675 Pain in left toe(s): Secondary | ICD-10-CM | POA: Diagnosis not present

## 2022-10-20 DIAGNOSIS — M25672 Stiffness of left ankle, not elsewhere classified: Secondary | ICD-10-CM

## 2022-10-20 DIAGNOSIS — R262 Difficulty in walking, not elsewhere classified: Secondary | ICD-10-CM | POA: Diagnosis not present

## 2022-10-20 NOTE — Therapy (Signed)
OUTPATIENT PHYSICAL THERAPY TREATMENT NOTE   Patient Name: Elizabeth Wang MRN: [id number] DOB:Jun 16, 1946, 76 y.o., female Today's Date: 10/22/2022  PCP: Ginger Organ. MD  REFERRING PROVIDER: Armond Hang, MD    END OF SESSION:   PT End of Session - 10/22/22 1024     Visit Number 7    Number of Visits 16    Date for PT Re-Evaluation 11/26/22    Authorization Type MCR/ BCBS  6th and 10th FOTO  10th note progress note    PT Start Time 1020    PT Stop Time 1100    PT Time Calculation (min) 40 min    Activity Tolerance Patient tolerated treatment well    Behavior During Therapy WFL for tasks assessed/performed                 Past Medical History:  Diagnosis Date   Acute pulmonary embolism ([place]) 06/2013   Arthritis    Asthma    years ago    Clotting disorder ([place])    DVT/PE   Diverticulosis    minor    Dyslipidemia    Esophageal reflux    Hypertension    Hypothyroidism    Numbness    TOES   Obesity    OSA (obstructive sleep apnea)    severe OSA with an AHI of 51/hr and nocturnal hypoxemia with O2 sats at low as 69%.   Paroxysmal atrial fibrillation ([facility])    1 isolated episode in the setting of acute stress with no further episodes.    Pseudogout    Past Surgical History:  Procedure Laterality Date   BREAST RECONSTRUCTION     CARDIOVERSION N/A 06/27/2020   Procedure: CARDIOVERSION;  Surgeon: [name], MD;  Location: [name];  Service: Cardiovascular;  Laterality: N/A;   CESAREAN SECTION     COLONOSCOPY  2004   repeat in ten years   KNEE ARTHROSCOPY Left    TONSILLECTOMY     TOTAL KNEE ARTHROPLASTY Right 01/01/2014   Procedure: TOTAL RIGHT KNEE ARTHROPLASTY;  Surgeon: [name], MD;  Location: [facility];  Service: Orthopedics;  Laterality: Right;   TOTAL KNEE ARTHROPLASTY Left 07/23/2014   Procedure: LEFT TOTAL KNEE ARTHROPLASTY;  Surgeon: [name], MD;  Location: [facility];  Service: Orthopedics;  Laterality: Left;   tummy tuck   1987   WISDOM TOOTH EXTRACTION     Patient Active Problem List   Diagnosis Date Noted   Gout 01/12/2014   Constipation 01/12/2014   S/P total knee arthroplasty 01/11/2014   Hypokalemia 01/03/2014   Hyponatremia 01/03/2014   Postoperative anemia due to acute blood loss 01/02/2014   Hypothyroidism 01/02/2014   Esophageal reflux 01/02/2014   Other and unspecified hyperlipidemia 01/02/2014   OA (osteoarthritis) of knee 01/01/2014   Essential hypertension, benign 11/28/2013   Pulmonary embolism ([place]) 11/28/2013   DVT (deep venous thrombosis) ([place]) 08/09/2013   Other pulmonary embolism and infarction 08/09/2013   Persistent atrial fibrillation ([place]) 07/14/2013   Knee pain, bilateral 07/14/2013    REFERRING DIAG: M25.572 (ICD-10-CM) - Pain in left ankle and joints of left foot    THERAPY DIAG:  Stiffness of left ankle, not elsewhere classified  Difficulty in walking, not elsewhere classified  Pain of left great toe  Rationale for Evaluation and Treatment Rehabilitation  PERTINENT HISTORY: 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 I just put in my heel lift in. I took off two layers to fit into my shoe  PAIN:  10-22-22  LT Ankle 0-1/10,  LT great Toe 2/10 pain. 10-20-22 Ankle 2/10,  Toe 9/10 pain. 10-15-22 Great toe LT 3/10, 4-5/10 ankle LT 10-08-22  0/10 pain Are you having pain? Yes: NPRS scale: 0/10 Pain location: great toe LT,  unable to descend 1st ray Pain description: dull ache Aggravating factors: walking on it when I walk > 7000 steps,  just living,  sometimes just throbbing,  steps Relieving factors: wearing a toe pad, salonpas topical No pain in my left ankle, uncomfortable because it does not bend   OBJECTIVE: (objective measures completed at initial evaluation unless otherwise dated)   DIAGNOSTIC FINDINGS: x rays recently taken but not available at [place]:  FOTO 51%  predicted 64% 10-20-22 73%   COGNITION: Overall cognitive status: Within functional limits for tasks assessed                         SENSATION: WFL   EDEMA:  Figure 8: RT 54 cm, LT 61 cm 10-15-22 10:30 AM  LT 59.5 cm   MUSCLE LENGTH: Hamstrings: Right 53 deg; Left 50 deg  10-15-22 LLD 31 in ASIS to malleolus LT  RT 31.75 inch     POSTURE: rounded shoulders, forward head, flexed trunk , and obesity   PALPATION: Not pain but increased stiffness / blocked talar glide   LOWER EXTREMITY ROM:   Active ROM Right eval Left eval LT 10-15-22 10-20-22 LT  Hip flexion        Hip extension        Hip abduction        Hip adduction        Hip internal rotation        Hip external rotation        Knee flexion        Knee extension        Ankle dorsiflexion 5 +9 from neutral 0 0  Ankle plantarflexion 65 51  55  Ankle inversion _0 Ankle eversion _1 Great toe distal flexion 50 25  39  Great toe  extension 45 10  10   (Blank rows = not tested)   LOWER EXTREMITY MMT:   MMT Right eval Left eval RT/LT 10-20-22  Hip flexion 4+ 4 4+/4  Hip extension 4 4- 4/4  Hip abduction 4- 4- 4-/4-  Hip adduction       Hip internal rotation       Hip external rotation       Knee flexion       Knee extension 5 4 5/4+  Ankle dorsiflexion 5 4 5/4-  Ankle plantarflexion       Ankle inversion 4+ 4- 4+/4  Ankle eversion 4+ 4- 4+/4   (Blank rows = not tested)       FUNCTIONAL TESTS:  5 times sit to stand: 11.15 6 MWT 12-21-23824 ft 1290-1755 ft            Sustained bridge  32 seconds             SLS RT 23 sec  LT 10sec but pain   Asterisk Signs Eval  (12-7- 2023) 10-15-22  10-20-22         5 x  STS 11.73 sec  but wt bears to RT 7.66 sec wt bears to RT   9.66 sec with equal wt bear        Max pain LT great toe 9/10 3/10  3/10 most of the time but occassionally 9/10 as in today         6MWT   824 ft 1290-1755 ft NT          sustained bridge  32 sec    Cramping in hamstring NT          SLS RT 23/LT 10 sec  RT 23 LT 7 sec  RT < 3  LT < 3 checked with and without shoes        DF LT  DF 0 LT DF- 0 LT         GAIT: Distance walked: 150 Assistive device utilized: None Level of assistance: Complete Independence Comments: antalgic gait left, with trunk lean to RT Pt with hammer toes and ulcer on LT great toe healing , with an extension pad.  Pt is unable to depress 1st ray with gait in stance/ push off phase, Pt tends to toe grip with shoes and is wearing more sneakers rather than sandals presently.    TODAY'S TREATMENT:  [facility] Adult PT Treatment:                                                DATE: 10-22-22 Therapeutic Exercise: Reviewed HEP for questions on LT ankle inversion Sitting inversion with GTB 3 x 10 Heel raises BIL 20 x Toe Yoga GTB Eversion LT and RT ankle Sitting inversion with GTB 3 x 10 Seated toe raise and heel raise Neuromuscular re-ed: Single limb with UE support for 5 sec or less Tandem stance  Manual Therapy: Talocrural distraction grade III-IV with pt LT LE with pt in prone Subtalar mobs grade III for both for supination/ pronation in prone MTPR along gastroc/ soleus. Talocrural distraction grade III-IV with pt LT LE on mat table and then foot stool for wt bearing with body weight and PT providing mobilization Trigger Point Dry Needling Treatment: Pre-treatment instruction: Patient instructed on dry needling rationale, procedures, and possible side effects including pain during treatment (achy,cramping feeling), bruising, drop of blood, lightheadedness, nausea, sweating. Patient Consent Given: Yes Education  handout provided: Yes Muscles treated: LT Gastroc lateral and medial  Needle size and number: .30x2m x 4  along lateral gastroc and medial lines Electrical stimulation performed: No Parameters: N/A Treatment response/outcome: Twitch response elicited and Palpable decrease in muscle tension Post-treatment instructions: Patient instructed to expect possible mild to moderate muscle soreness later today and/or tomorrow. Patient instructed in methods to reduce muscle soreness and to continue prescribed HEP. Patient was also educated on signs and symptoms of infection and to seek medical attention should they occur. Patient verbalized understanding of these instructions and education.    [facility]                                                DATE: 10-20-22 Therapeutic Exercise: STS with 10 lb x 2 (total 20) DB  3 x 10 Sitting inversion with GTB 3 x 10 Heel raises BIL 20 x Seated toe raise and  heel raise Prone quad stretch with strap 30-60 sec holds on RT and then LT Standing 3-Way Leg with red t band On stool , runner's stretch using power bad for tibial AP self mob glide and return demo Sit to stand with 10 lb weight. Neuromuscular re-ed: Single limb with UE support for 5 sec or less Tandem stance Manual Therapy: Talocrural distraction grade III-IV with pt LT LE with pt in prone Subtalar mobs grade III for both for supination/ pronation in prone MTPR along gastroc/ soleus.  Prone ankle edema reduction massage with knee flexion to maixmize gravity assist. Talocrural distraction grade III-IV with pt LT LE on mat table and then foot stool for wt bearing with body weight and PT providing mobilization Subtalar mobs grade III for both for supination/ pronation MTPR along gastroc/ soleus.  Trigger Point Dry Needling Treatment: Pre-treatment instruction: Patient instructed on dry needling rationale, procedures, and possible side effects including pain during treatment  (achy,cramping feeling), bruising, drop of blood, lightheadedness, nausea, sweating. Patient Consent Given: Yes Education handout provided: Yes Muscles treated: LT Gastroc lateral and medial  Needle size and number: .30x50m x 2  along lateral gastroc and medial lines Electrical stimulation performed: No Parameters: N/A Treatment response/outcome: Twitch response elicited and Palpable decrease in muscle tension Post-treatment instructions: Patient instructed to expect possible mild to moderate muscle soreness later today and/or tomorrow. Patient instructed in methods to reduce muscle soreness and to continue prescribed HEP. Patient was also educated on signs and symptoms of infection and to seek medical attention should they occur. Patient verbalized understanding of these instructions and education.   [facility]                                                [facility]: 10[facility]22  Perfomance testing - 6MWT 824 ft (1290 to 1755 ft norm) 7.65 5 x sts Goals Therapeutic Exercise: STS with 10 lb DB  3 x 10 Standing 3-Way Leg Reach with GTB Counter Support   3 x 10 PT assist with Runner's stretch on stool for increased DF   Manual Therapy: Prone ankle edema reduction massage with knee flexion to maixmize gravity assist. Talocrural distraction grade III-IV with pt LT LE on mat table and then foot stool for wt bearing with body weight and PT providing mobilization Subtalar mobs grade III for both for supination/ pronation MTPR along gastroc/ soleus.  Trigger Point Dry Needling Treatment: Pre-treatment instruction: Patient instructed on dry needling rationale, procedures, and possible side effects including pain during treatment (achy,cramping feeling), bruising, drop of blood, lightheadedness, nausea, sweating. Patient Consent Given: Yes Education handout provided: Yes Muscles treated: LT Gastroc lateral and medial  Needle size and number: .30x559mx 2  along lateral gastroc and medial  lines Electrical stimulation performed: No Parameters: N/A Treatment response/outcome: Twitch response elicited and Palpable decrease in muscle tension Post-treatment instructions: Patient instructed to expect possible mild to moderate muscle soreness later today and/or tomorrow. Patient instructed in methods to reduce muscle soreness and to continue prescribed HEP. Patient was also educated on signs and symptoms of infection and to seek medical attention should they occur. Patient verbalized understanding of these instructions and education.  DATE: 10-01-22 eval      PATIENT EDUCATION:  Education details: POC FOTO report, explanation of finds.  Course of care Person educated: Patient Education method: Explanation Education comprehension: verbalized understanding and needs further education   HOME EXERCISE PROGRAM:  Access Code: [email or web address] URL: [email or web address] Date: 10/22/2022 Prepared by: [name]  Program Notes Toe Yoga as shown in clinicDorsiflexion stretch on footstool with power band as shown in clinic.  Buy on [employer] with various strengths  Exercises - Seated Toe Raise  - 1 x daily - 7 x weekly - 2 sets - 10 reps - Seated Calf Stretch with Strap  - 1 x daily - 7 x weekly - 2 sets - 3 reps - 30 hold - Standing 3-Way Leg Reach with Resistance at Ankles and Counter Support  - 1 x daily - 7 x weekly - 3 sets - 10 reps - Sit to stand with sink support Movement snack  - 1 x daily - 7 x weekly - 3 sets - 10 reps - Single Leg Stance with Support  - 2 x daily - 7 x weekly - 1 sets - 3-5 reps - hold 15 sec hold - Heel raise with counter support and towel under toes  - 1 x daily - 7 x weekly - 3 sets - 10 reps - Seated Figure 4 Ankle Inversion with Resistance  - 1 x daily - 7 x weekly - 3 sets - 10 reps - Seated Ankle Eversion with  Resistance  - 1 x daily - 7 x weekly - 3 sets - 10 reps ASSESSMENT:   CLINICAL IMPRESSION: Mrs mcclaran arrives to PT reporting LT Ankle 0-1/10,  LT great Toe 2/10 pain improved from evaluation. Pt LLD with LT LE shorter functionally than RT and given heel lift  previous visit but only tried today as she was sitting in foyer before appt.  Pt with increased ankle motion in left since eval and DF to 0/10 with hard end feel. Pt had question regarding HEP and specific questions addressed for proper execution with return demo and new HEP printed and written for more clear carry over for Ms Samet.  Pt consented to TPDN and  was closely monitored throughout session with increased tissue extensibility.  Worked on edema reduction in prone followed with talocrural and subtalar mobs. Working on LTG now since all STG met.    Pt mentioned needing to make appt to be assessed by [facility] doctor. Will continue toward completion of  goals.    OBJECTIVE IMPAIRMENTS: Abnormal gait, decreased balance, difficulty walking, decreased ROM, decreased strength, hypomobility, obesity, and pain.    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    REHAB POTENTIAL: Good   CLINICAL DECISION MAKING: Evolving/moderate complexity   EVALUATION COMPLEXITY: Moderate     GOALS: Goals reviewed with patient? No   SHORT TERM GOALS: Target date: 10-29-21 Pt will be independent with intial HEP Baseline:no knowledge,  10-07-22 initial HEP given  Goal status: MET   2.  Pt will be able perform STS with even wt distribution in bil LE Baseline: Pt wt bears to RT in STS Goal status: MET    3.  Pt pain with functional activities in Great toe will  be reduced to 5/10  Baseline: 9/10 10-15-22  3/10 Goal status: MET     LONG TERM GOALS: Target date: 11-26-22   Pt will be independent with advanced HEP.  Baseline:  Goal status: ONGOING   2.  FOTO will improve from  51%  to   64%  indicating improved functional mobility.  Baseline: eval 51% 10-20-22 73% Goal status: MET   3.  Pain will decrease to 3/10 with all functional activities from 9/10 Baseline: Pt max pain after functional activities. 9/10 Goal status: ONGOING   4.  Pt will be able to perform 6MWT within normal range for age Baseline: 824 ft 1290-1755 ft Goal status:ONGOING   5.  Pt will be able to perform balance with SLS for at least 30 sec on RT and LT to show improved balance/strength in SL activities Baseline: RT 23 sec  LT 10 sec tries to use UE 10-20-22 < 3 sec BIL today Goal status: ONGOING   6.  Pt will be able to perform floor to stand transfer to show  increased strength and safe fall preparedness  Baseline: unable to perform Goal status: INITIAL    7. Demonstrate 4 + /5 LT LE strength to improve stability, safety and endurance of community level function            Baseline: 4-/5 10-20-22  See flow chart            Goal status: IONGOING       PLAN:   PT FREQUENCY: 1-2x/week   PT DURATION: 8 weeks   PLANNED INTERVENTIONS: Therapeutic exercises, Therapeutic activity, Neuromuscular re-education, Balance training, Gait training, Patient/Family education, Self Care, Joint mobilization, Stair training, Dry Needling, Electrical stimulation, Cryotherapy, Moist heat, Taping, Ionotophoresis 75m/ml Dexamethasone, Manual therapy, and Re-evaluation   PLAN FOR NEXT SESSION: TPDN, Manual, DGI? Response to edema reduction.   May add prone quad stretch to HJordan PT, AVirt[facility] Jersey Hospital - BerlinCertified Exercise Expert for the Aging Adult  10/22/22 12:59 PM Phone: [phone]: [phone]

## 2022-10-22 ENCOUNTER — Ambulatory Visit: Payer: Medicare Other | Admitting: Physical Therapy

## 2022-10-22 ENCOUNTER — Encounter: Payer: Self-pay | Admitting: Physical Therapy

## 2022-10-22 ENCOUNTER — Encounter: Payer: Self-pay | Admitting: Podiatry

## 2022-10-22 ENCOUNTER — Ambulatory Visit (INDEPENDENT_AMBULATORY_CARE_PROVIDER_SITE_OTHER): Payer: Medicare Other | Admitting: Podiatry

## 2022-10-22 DIAGNOSIS — L97522 Non-pressure chronic ulcer of other part of left foot with fat layer exposed: Secondary | ICD-10-CM | POA: Diagnosis not present

## 2022-10-22 DIAGNOSIS — R262 Difficulty in walking, not elsewhere classified: Secondary | ICD-10-CM | POA: Diagnosis not present

## 2022-10-22 DIAGNOSIS — M25672 Stiffness of left ankle, not elsewhere classified: Secondary | ICD-10-CM

## 2022-10-22 DIAGNOSIS — M79675 Pain in left toe(s): Secondary | ICD-10-CM | POA: Diagnosis not present

## 2022-10-25 NOTE — Progress Notes (Signed)
  Subjective:  Patient ID: Elizabeth Wang, female    DOB: May 11, 1946,  MRN: [id number]  Chief Complaint  Patient presents with   Foot Ulcer    3 week follow up left toe    76 y.o. female presents with the above complaint. History confirmed with patient.  She presents today for ongoing wound care   Objective:  Physical Exam: warm, good capillary refill, no trophic changes or ulcerative lesions, normal DP and PT pulses, normal sensory exam, and full-thickness ulceration left hallux measuring 3 mm x 2 mm x 1 mm.  No drainage or signs of infection overlying hyperkeratosis     Assessment:   1. Ulcer of great toe, left, with fat layer exposed ([place])       Plan:  Patient was evaluated and treated and all questions answered.  Ulcer left hallux -We discussed the etiology and factors that are a part of the wound healing process.  We also discussed the risk of infection both soft tissue and osteomyelitis from open ulceration.  Discussed the risk of limb loss if this happens or worsens. -Debridement as below. -Dressed with Silvadene, DSD. -Continue home dressing changes daily with bandage and antibiotic ointment -Silicone toe pad was dispensed today to offload the lesion to reduce pressure secondary to hallux malleus deformity   Procedure: Excisional Debridement of Wound Rationale: Removal of non-viable soft tissue from the wound to promote healing.  Anesthesia: none Post-Debridement Wound Measurements: 0.3 cm x 0.2 cm x 0.1 cm  Type of Debridement: Sharp Excisional Tissue Removed: Non-viable soft tissue Depth of Debridement: subcutaneous tissue. Technique: Sharp excisional debridement to bleeding, viable wound base.  Dressing: Dry, sterile, compression dressing. Disposition: Patient tolerated procedure well.    Return in about 4 weeks (around 11/19/2022) for wound care.

## 2022-10-27 ENCOUNTER — Ambulatory Visit: Payer: Medicare Other | Attending: Internal Medicine | Admitting: Physical Therapy

## 2022-10-27 ENCOUNTER — Encounter: Payer: Self-pay | Admitting: Physical Therapy

## 2022-10-27 DIAGNOSIS — M25672 Stiffness of left ankle, not elsewhere classified: Secondary | ICD-10-CM | POA: Insufficient documentation

## 2022-10-27 DIAGNOSIS — R262 Difficulty in walking, not elsewhere classified: Secondary | ICD-10-CM | POA: Insufficient documentation

## 2022-10-27 DIAGNOSIS — M79675 Pain in left toe(s): Secondary | ICD-10-CM | POA: Insufficient documentation

## 2022-10-27 NOTE — Therapy (Signed)
OUTPATIENT PHYSICAL THERAPY TREATMENT NOTE   Patient Name: Elizabeth Wang MRN: [id number] DOB:Mar 16, 1946, 77 y.o., female Today's Date: 10/27/2022  PCP: Ginger Organ. MD  REFERRING PROVIDER: Armond Hang, MD    END OF SESSION:   PT End of Session - 10/27/22 1502     Visit Number 8    Number of Visits 16    Date for PT Re-Evaluation 11/26/22    Authorization Type MCR/ BCBS  6th and 10th FOTO  10th note progress note    Progress Note Due on Visit 10    PT Start Time 1504    PT Stop Time 1556    PT Time Calculation (min) 52 min    Activity Tolerance Patient tolerated treatment well    Behavior During Therapy WFL for tasks assessed/performed                  Past Medical History:  Diagnosis Date   Acute pulmonary embolism ([place]) 06/2013   Arthritis    Asthma    years ago    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   Paroxysmal atrial fibrillation ([facility])    1 isolated episode in the setting of acute stress with no further episodes.    Pseudogout    Past Surgical History:  Procedure Laterality Date   BREAST RECONSTRUCTION     CARDIOVERSION N/A 06/27/2020   Procedure: CARDIOVERSION;  Surgeon: [name], MD;  Location: [facility];  Service: Cardiovascular;  Laterality: N/A;   CESAREAN SECTION     COLONOSCOPY  2004   repeat in ten years   KNEE ARTHROSCOPY Left    TONSILLECTOMY     TOTAL KNEE ARTHROPLASTY Right 01/01/2014   Procedure: TOTAL RIGHT KNEE ARTHROPLASTY;  Surgeon: [name], MD;  Location: [facility];  Service: Orthopedics;  Laterality: Right;   TOTAL KNEE ARTHROPLASTY Left 07/23/2014   Procedure: LEFT TOTAL KNEE ARTHROPLASTY;  Surgeon: [name], MD;  Location: [facility];  Service: Orthopedics;   Laterality: Left;   tummy tuck  1987   WISDOM TOOTH EXTRACTION     Patient Active Problem List   Diagnosis Date Noted   Closed fracture of proximal phalanx of lesser toe of right foot 09/01/2022   Osteoarthritis of talonavicular joint 09/01/2022   Closed fracture of fifth metatarsal bone 09/05/2021   Contusion of left knee 04/05/2019   Pain in left knee 04/03/2019   Osteoarthritis of right glenohumeral joint 11/24/2017   Pain of left great toe 12/17/2016   Skin ulcer of left great toe, limited to breakdown of skin ([place]) 12/17/2016   Gout 01/12/2014   S/P total knee arthroplasty 01/11/2014   Hypokalemia 01/03/2014   Hyponatremia 01/03/2014   Postoperative anemia due to acute blood loss 01/02/2014   Hypothyroidism 01/02/2014   Other and unspecified hyperlipidemia 01/02/2014   OA (osteoarthritis) of knee 01/01/2014   Essential hypertension, benign 11/28/2013   Pulmonary embolism ([place]) 11/28/2013   DVT (deep venous thrombosis) ([place]) 08/09/2013   Other pulmonary embolism and infarction 08/09/2013   Persistent atrial fibrillation ([place]) 07/14/2013    REFERRING DIAG: M25.572 (ICD-10-CM) - Pain in left  ankle and joints of left foot    THERAPY DIAG:  Difficulty in walking, not elsewhere classified  Pain of left great toe  Stiffness of left ankle, not elsewhere classified  Rationale for Evaluation and Treatment Rehabilitation  PERTINENT HISTORY: 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 I have been on it more but not more than what I have been doing  previously."  PAIN:  10-22-22  LT Ankle 0-1/10,  LT great Toe 2/10 pain. 10-20-22 Ankle 2/10,  Toe 9/10 pain. 10-15-22 Great toe LT 3/10, 4-5/10 ankle LT 10-08-22  0/10 pain Are you having pain? Yes: NPRS scale: 0/10 Pain location: great toe LT,  unable to descend 1st ray Pain description: dull ache Aggravating factors: walking on it when I walk > 7000 steps,  just living,  sometimes just throbbing, steps Relieving factors: wearing a toe pad, salonpas topical No pain in my left ankle, uncomfortable because it does not bend   OBJECTIVE: (objective measures completed at initial evaluation unless otherwise dated)   DIAGNOSTIC FINDINGS: x rays recently taken but not available at [place]:  FOTO 51%  predicted 64% 10-20-22 73%   COGNITION: Overall cognitive status: Within functional limits for tasks assessed                         SENSATION: WFL   EDEMA:  Figure 8: RT 54 cm, LT 61 cm 10-15-22 10:30 AM  LT 59.5 cm   MUSCLE LENGTH: Hamstrings: Right 53 deg; Left 50 deg  10-15-22 LLD 31 in ASIS to malleolus LT  RT 31.75 inch     POSTURE: rounded shoulders, forward head, flexed trunk , and obesity   PALPATION: Not pain but increased stiffness / blocked talar glide   LOWER EXTREMITY ROM:   Active ROM Right eval Left eval LT 10-15-22 10-20-22 LT  Hip flexion        Hip extension        Hip abduction        Hip adduction        Hip internal rotation        Hip external rotation        Knee flexion  Knee extension        Ankle dorsiflexion 5 +9 from neutral 0 0  Ankle plantarflexion 65 51  55  Ankle inversion _0 Ankle eversion _1 Great toe distal flexion 50 25  39  Great toe  extension 45 10  10   (Blank rows = not tested)   LOWER EXTREMITY MMT:   MMT Right eval Left eval RT/LT 10-20-22  Hip flexion 4+ 4 4+/4  Hip extension 4 4- 4/4  Hip abduction 4- 4- 4-/4-  Hip adduction       Hip internal rotation       Hip  external rotation       Knee flexion       Knee extension 5 4 5/4+  Ankle dorsiflexion 5 4 5/4-  Ankle plantarflexion       Ankle inversion 4+ 4- 4+/4  Ankle eversion 4+ 4- 4+/4   (Blank rows = not tested)       FUNCTIONAL TESTS:  5 times sit to stand: 11.15 6 MWT [id number] ft 1290-1755 ft            Sustained bridge  32 seconds             SLS RT 23 sec  LT 10sec but pain   [facility] Signs Eval (12-7- 2023) 10-15-22  10-20-22         5 x  STS 11.73 sec but wt bears to RT 7.66 sec wt bears to RT   9.66 sec with equal wt bear        Max pain LT great toe 9/10 3/10  3/10 most of the time but occassionally 9/10 as in today         6MWT   824 ft 1290-1755 ft NT          sustained bridge  32 sec    Cramping in hamstring NT          SLS RT 23/LT 10 sec  RT 23 LT 7 sec  RT < 3  LT < 3 checked with and without shoes        DF LT  DF 0 LT DF- 0 LT         GAIT: Distance walked: 150 Assistive device utilized: None Level of assistance: Complete Independence Comments: antalgic gait left, with trunk lean to RT Pt with hammer toes and ulcer on LT great toe healing , with an extension pad.  Pt is unable to depress 1st ray with gait in stance/ push off phase, Pt tends to toe grip with shoes and is wearing more sneakers rather than sandals presently.    TODAY'S TREATMENT:  [facility] Adult PT Treatment:                                                DATE: 10/27/2022 Therapeutic Exercise: Standing hip hiking 3 x 10 standing on RLE LLE sidelying hip abduction 2 x 10 with CW/CCW circles Prone calf stretch 2 x 30 for both gastroc/ soleus.  Updated HEP for standing hip hikes and sidelying hip abduction Manual Therapy: IASTM along the gastroc/ soleus and peroneals Sub-talar mobs grade III in prone for supination/ pronation Edema reduction massage in prone  Trigger Point Dry-Needling  Treatment instructions: Expect mild to moderate muscle soreness. S/S of pneumothorax if  dry needled over a lung  field, and to seek immediate medical attention should they occur. Patient verbalized understanding of these instructions and education.  Patient Consent Given: Yes Education handout provided: Previously provided Muscles treated: peroneal longus/ brevis, gastro Electrical stimulation performed: No Parameters: N/A Treatment response/outcome: twitch response, and muscle lengthening.   Neuromuscular re-ed: Gait training rocking forward with PF/ backward with lead leg DF breaking up gait pattern rocking 10-15 times before advancing leg and peforming again  x 2 sets Gait training using mirror for visual feedback to reduce trendelenberg pattern with RLE stance    [facility] Adult PT Treatment:                                                DATE: 10-22-22 Therapeutic Exercise: Reviewed HEP for questions on LT ankle inversion Sitting inversion with GTB 3 x 10 Heel raises BIL 20 x Toe Yoga GTB Eversion LT and RT ankle Sitting inversion with GTB 3 x 10 Seated toe raise and heel raise Neuromuscular re-ed: Single limb with UE support for 5 sec or less Tandem stance  Manual Therapy: Talocrural distraction grade III-IV with pt LT LE with pt in prone Subtalar mobs grade III for both for supination/ pronation in prone MTPR along gastroc/ soleus. Talocrural distraction grade III-IV with pt LT LE on mat table and then foot stool for wt bearing with body weight and PT providing mobilization Trigger Point Dry Needling Treatment: Pre-treatment instruction: Patient instructed on dry needling rationale, procedures, and possible side effects including pain during treatment (achy,cramping feeling), bruising, drop of blood, lightheadedness, nausea, sweating. Patient Consent Given: Yes Education handout provided: Yes Muscles treated: LT Gastroc lateral and medial  Needle size and number: .30x59m x 4  along lateral gastroc and medial lines Electrical stimulation performed: No Parameters: N/A Treatment  response/outcome: Twitch response elicited and Palpable decrease in muscle tension Post-treatment instructions: Patient instructed to expect possible mild to moderate muscle soreness later today and/or tomorrow. Patient instructed in methods to reduce muscle soreness and to continue prescribed HEP. Patient was also educated on signs and symptoms of infection and to seek medical attention should they occur. Patient verbalized understanding of these instructions and education.    [facility]                                                [facility]: [facility]22 Therapeutic Exercise: STS with 10 lb x 2 (total 20) DB  3 x 10 Sitting inversion with GTB 3 x 10 Heel raises BIL 20 x Seated toe raise and heel raise Prone quad stretch with strap 30-60 sec holds on RT and then LT Standing 3-Way Leg with red t band On stool , runner's stretch using power bad for tibial AP self mob glide and return demo Sit to stand with 10 lb weight. Neuromuscular re-ed: Single limb with UE support for 5 sec or less Tandem stance Manual Therapy: Talocrural distraction grade III-IV with pt LT LE with pt in prone Subtalar mobs grade III for both for supination/ pronation in prone MTPR along gastroc/ soleus.  Prone ankle edema reduction massage with knee flexion to maixmize gravity assist. Talocrural distraction grade III-IV with pt LT LE on mat  table and then foot stool for wt bearing with body weight and PT providing mobilization Subtalar mobs grade III for both for supination/ pronation MTPR along gastroc/ soleus.  Trigger Point Dry Needling Treatment: Pre-treatment instruction: Patient instructed on dry needling rationale, procedures, and possible side effects including pain during treatment (achy,cramping feeling), bruising, drop of blood, lightheadedness, nausea, sweating. Patient Consent Given: Yes Education handout provided: Yes Muscles treated: LT Gastroc lateral and medial  Needle size and number:  .30x27m x 2  along lateral gastroc and medial lines Electrical stimulation performed: No Parameters: N/A Treatment response/outcome: Twitch response elicited and Palpable decrease in muscle tension Post-treatment instructions: Patient instructed to expect possible mild to moderate muscle soreness later today and/or tomorrow. Patient instructed in methods to reduce muscle soreness and to continue prescribed HEP. Patient was also educated on signs and symptoms of infection and to seek medical attention should they occur. Patient verbalized understanding of these instructions and education.        PATIENT EDUCATION:  Education details: POC FOTO report, explanation of finds.  Course of care Person educated: Patient Education method: Explanation Education comprehension: verbalized understanding and needs further education   HOME EXERCISE PROGRAM:  Access Code: [email or web address] URL: [email or web address] Date: 10/27/2022 Prepared by: [place] Program Notes Toe Yoga as shown in clinicDorsiflexion stretch on footstool with power band as shown in clinic.  Buy on [employer] various strengths  Exercises - Seated Toe Raise  - 1 x daily - 7 x weekly - 2 sets - 10 reps - Seated Calf Stretch with Strap  - 1 x daily - 7 x weekly - 2 sets - 3 reps - 30 hold - Standing 3-Way Leg Reach with Resistance at Ankles and Counter Support  - 1 x daily - 7 x weekly - 3 sets - 10 reps - Sit to stand with sink support Movement snack  - 1 x daily - 7 x weekly - 3 sets - 10 reps - Single Leg Stance with Support  - 2 x daily - 7 x weekly - 1 sets - 3-5 reps - hold 15 sec hold - Heel raise with counter support and towel under toes  - 1 x daily - 7 x weekly - 3 sets - 10 reps - Seated Figure 4 Ankle Inversion with Resistance  - 1 x daily - 7 x weekly - 3 sets - 10 reps - Seated Ankle Eversion with Resistance  - 1 x daily - 7 x weekly - 3 sets - 10 reps - Hip Hiking on Step  - 1 x daily -  7 x weekly - 3 sets - 10 reps - Sidelying Hip Abduction  - 1 x daily - 7 x weekly - 3 sets - 12 reps  ASSESSMENT:   CLINICAL IMPRESSION: Pt arrives to PT today reporting increased swelling in the L ankle which she is unsure of. Worked on edema reduction which visually she exhibited hemosiderin staining on the posteromedial aspect of the R calf. Performed IASTM following TPDN for gastroc / soleus and peroneals on the R. Worked on ankle mobility and hip strengthening. Practiced breaking gait cycle up into rocking which she demonstrates a trendelenberg motion with RLE stance. Worked on strengtheing both hip abductions with focus on RLE in standing using hip hikes. Practiced gait training in // using mirror for visual feedback. Overall she noted reduction in stiffness in the ankle and was able to sense when she demonstrated a trendelenberg pattern and was able to  correct with cues.    OBJECTIVE IMPAIRMENTS: Abnormal gait, decreased balance, difficulty walking, decreased ROM, decreased strength, hypomobility, obesity, and pain.    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    REHAB POTENTIAL: Good   CLINICAL DECISION MAKING: Evolving/moderate complexity   EVALUATION COMPLEXITY: Moderate     GOALS: Goals reviewed with patient? No   SHORT TERM GOALS: Target date: 10-29-21 Pt will be independent with intial HEP Baseline:no knowledge,  10-07-22 initial HEP given  Goal status: MET   2.  Pt will be able perform STS with even wt distribution in bil LE Baseline: Pt wt bears to RT in STS Goal status: MET   3.  Pt pain with functional activities  in Great toe will be reduced to 5/10  Baseline: 9/10 10-15-22  3/10 Goal status: MET     LONG TERM GOALS: Target date: 11-26-22   Pt will be independent with advanced HEP.  Baseline:  Goal status: ONGOING   2.  FOTO will improve from  51%  to   64%  indicating improved functional mobility.  Baseline: eval 51% 10-20-22 73% Goal status: MET   3.  Pain will decrease to 3/10 with all functional activities from 9/10 Baseline: Pt max pain after functional activities. 9/10 Goal status: ONGOING   4.  Pt will be able to perform 6MWT within normal range for age Baseline: 824 ft 1290-1755 ft Goal status:ONGOING   5.  Pt will be able to perform balance with SLS for at least 30 sec on RT and LT to show improved balance/strength in SL activities Baseline: RT 23 sec  LT 10 sec tries to use UE 10-20-22 < 3 sec BIL today Goal status: ONGOING   6.  Pt will be able to perform floor to stand transfer to show  increased strength and safe fall preparedness  Baseline: unable to perform Goal status: INITIAL    7. Demonstrate 4 + /5 LT LE strength to improve stability, safety and endurance of community level function            Baseline: 4-/5 10-20-22  See flow chart            Goal status: IONGOING       PLAN:   PT FREQUENCY: 1-2x/week   PT DURATION: 8 weeks   PLANNED INTERVENTIONS: Therapeutic exercises, Therapeutic activity, Neuromuscular re-education, Balance training, Gait training, Patient/Family education, Self Care, Joint mobilization, Stair training, Dry Needling, Electrical stimulation, Cryotherapy, Moist heat, Taping, Ionotophoresis 51m/ml Dexamethasone, Manual therapy, and Re-evaluation   PLAN FOR NEXT SESSION: TPDN, Manual, DGI? Response to edema reduction.   May add prone quad stretch to [facility] CenterPT, DPT, LAT, ATC  10/27/22  4:11 PM

## 2022-10-29 ENCOUNTER — Encounter: Payer: Self-pay | Admitting: Physical Therapy

## 2022-10-29 ENCOUNTER — Ambulatory Visit: Payer: Medicare Other | Admitting: Physical Therapy

## 2022-10-29 DIAGNOSIS — M25672 Stiffness of left ankle, not elsewhere classified: Secondary | ICD-10-CM

## 2022-10-29 DIAGNOSIS — R262 Difficulty in walking, not elsewhere classified: Secondary | ICD-10-CM

## 2022-10-29 DIAGNOSIS — M79675 Pain in left toe(s): Secondary | ICD-10-CM | POA: Diagnosis not present

## 2022-10-29 NOTE — Therapy (Signed)
OUTPATIENT PHYSICAL THERAPY TREATMENT NOTE   Patient Name: Elizabeth Wang MRN: [id number] DOB:05-07-1946, 77 y.o., female Today's Date: 10/29/2022  PCP: Ginger Organ. MD  REFERRING PROVIDER: Armond Hang, MD    END OF SESSION:   PT End of Session - 10/29/22 1507     Visit Number 9    Number of Visits 16    Date for PT Re-Evaluation 11/26/22    Authorization Type MCR/ BCBS  6th and 10th FOTO  10th note progress note    Progress Note Due on Visit 10    PT Start Time 1504    PT Stop Time 1550    PT Time Calculation (min) 46 min    Activity Tolerance Patient tolerated treatment well    Behavior During Therapy [facility] for tasks assessed/performed                   Past Medical History:  Diagnosis Date   Acute pulmonary embolism ([place]) 06/2013   Arthritis    Asthma    years ago    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   Paroxysmal atrial fibrillation (HCC)    1 isolated episode in the setting of acute stress with no further episodes.    Pseudogout    Past Surgical History:  Procedure Laterality Date   BREAST RECONSTRUCTION     CARDIOVERSION N/A 06/27/2020   Procedure: CARDIOVERSION;  Surgeon: [name], MD;  Location: [facility];  Service: Cardiovascular;  Laterality: N/A;   CESAREAN SECTION     COLONOSCOPY  2004   repeat in ten years   KNEE ARTHROSCOPY Left    TONSILLECTOMY     TOTAL KNEE ARTHROPLASTY Right 01/01/2014   Procedure: TOTAL RIGHT KNEE ARTHROPLASTY;  Surgeon: [name], MD;  Location: [facility];  Service: Orthopedics;  Laterality: Right;   TOTAL KNEE ARTHROPLASTY Left 07/23/2014   Procedure: LEFT TOTAL KNEE ARTHROPLASTY;  Surgeon: [name], MD;  Location: [facility];  Service: Orthopedics;   Laterality: Left;   tummy tuck  1987   WISDOM TOOTH EXTRACTION     Patient Active Problem List   Diagnosis Date Noted   Closed fracture of proximal phalanx of lesser toe of right foot 09/01/2022   Osteoarthritis of talonavicular joint 09/01/2022   Closed fracture of fifth metatarsal bone 09/05/2021   Contusion of left knee 04/05/2019   Pain in left knee 04/03/2019   Osteoarthritis of right glenohumeral joint 11/24/2017   Pain of left great toe 12/17/2016   Skin ulcer of left great toe, limited to breakdown of skin ([place]) 12/17/2016   Gout 01/12/2014   S/P total knee arthroplasty 01/11/2014   Hypokalemia 01/03/2014   Hyponatremia 01/03/2014   Postoperative anemia due to acute blood loss 01/02/2014   Hypothyroidism 01/02/2014   Other and unspecified hyperlipidemia 01/02/2014   OA (osteoarthritis) of knee 01/01/2014   Essential hypertension, benign 11/28/2013   Pulmonary embolism ([place]) 11/28/2013   DVT (deep venous thrombosis) ([place]) 08/09/2013   Other pulmonary embolism and infarction 08/09/2013   Persistent atrial fibrillation ([place]) 07/14/2013    REFERRING DIAG: M25.572 (ICD-10-CM) - Pain in  left ankle and joints of left foot    THERAPY DIAG:  Difficulty in walking, not elsewhere classified  Stiffness of left ankle, not elsewhere classified  Rationale for Evaluation and Treatment Rehabilitation  PERTINENT HISTORY: 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 I saw Cecille Rubin yesterday and she referenced wanted to meet  up. I did all my exercises this morning."  PAIN:  10-22-22  LT Ankle 0-1/10,  LT great Toe 2/10 pain. 10-20-22 Ankle 2/10,  Toe 9/10 pain. 10-15-22 Great toe LT 3/10, 4-5/10 ankle LT 10-08-22  0/10 pain Are you having pain? Yes: NPRS scale: 0/10 Pain location: great toe LT,  unable to descend 1st ray Pain description: dull ache Aggravating factors: walking on it when I walk > 7000 steps,  just living,  sometimes just throbbing, steps Relieving factors: wearing a toe pad, salonpas topical No pain in my left ankle, uncomfortable because it does not bend   OBJECTIVE: (objective measures completed at initial evaluation unless otherwise dated)   DIAGNOSTIC FINDINGS: x rays recently taken but not available at [place]:  FOTO 51%  predicted 64% 10-20-22 73%   COGNITION: Overall cognitive status: Within functional limits for tasks assessed                         SENSATION: WFL   EDEMA:  Figure 8: RT 54 cm, LT 61 cm 10-15-22 10:30 AM  LT 59.5 cm   MUSCLE LENGTH: Hamstrings: Right 53 deg; Left 50 deg  10-15-22 LLD 31 in ASIS to malleolus LT  RT 31.75 inch     POSTURE: rounded shoulders, forward head, flexed trunk , and obesity   PALPATION: Not pain but increased stiffness / blocked talar glide   LOWER EXTREMITY ROM:   Active ROM Right eval Left eval LT 10-15-22 10-20-22 LT  Hip flexion        Hip extension        Hip abduction        Hip adduction        Hip internal rotation        Hip external rotation  Knee flexion        Knee extension        Ankle dorsiflexion 5 +9 from neutral 0 0  Ankle plantarflexion 65 51  55  Ankle inversion _0 Ankle eversion _1 Great toe distal flexion 50 25  39  Great toe  extension 45 10  10   (Blank rows = not tested)   LOWER EXTREMITY MMT:   MMT Right eval Left eval RT/LT 10-20-22  Hip flexion 4+ 4 4+/4  Hip extension 4 4- 4/4  Hip abduction 4- 4- 4-/4-  Hip adduction       Hip  internal rotation       Hip external rotation       Knee flexion       Knee extension 5 4 5/4+  Ankle dorsiflexion 5 4 5/4-  Ankle plantarflexion       Ankle inversion 4+ 4- 4+/4  Ankle eversion 4+ 4- 4+/4   (Blank rows = not tested)       FUNCTIONAL TESTS:  5 times sit to stand: 11.15 6 MWT [id number] ft 1290-1755 ft            Sustained bridge  32 seconds             SLS RT 23 sec  LT 10sec but pain   [facility] Signs Eval (12-7- 2023) 10-15-22  10-20-22         5 x  STS 11.73 sec but wt bears to RT 7.66 sec wt bears to RT   9.66 sec with equal wt bear        Max pain LT great toe 9/10 3/10  3/10 most of the time but occassionally 9/10 as in today         6MWT   824 ft 1290-1755 ft NT          sustained bridge  32 sec    Cramping in hamstring NT          SLS RT 23/LT 10 sec  RT 23 LT 7 sec  RT < 3  LT < 3 checked with and without shoes        DF LT  DF 0 LT DF- 0 LT         GAIT: Distance walked: 150 Assistive device utilized: None Level of assistance: Complete Independence Comments: antalgic gait left, with trunk lean to RT Pt with hammer toes and ulcer on LT great toe healing , with an extension pad.  Pt is unable to depress 1st ray with gait in stance/ push off phase, Pt tends to toe grip with shoes and is wearing more sneakers rather than sandals presently.    TODAY'S TREATMENT:  [place] Adult PT Treatment:                                                DATE: 10/29/2021 Therapeutic Exercise: Seated hip ER with RTB 3 x 10 Ankle posterior tib strengthening 3 x10  Manual Therapy: IASTM along the gastroc/ soleus and peroneals Sub-talar mobs grade III in prone for supination/ pronation, talocural LAD grad III Edema reduction massage in prone  Trigger Point Dry-Needling  Treatment instructions: Expect mild to moderate muscle soreness. S/S of pneumothorax if dry needled over a lung field, and to seek immediate medical attention  should they occur. Patient verbalized  understanding of these instructions and education.  Patient Consent Given: Yes Education handout provided: Previously provided Muscles treated: peroneal longus/ brevis, gastro Electrical stimulation performed: No Parameters: N/A Treatment response/outcome: twitch response, and muscle lengthening.   Neuromuscular re-ed: Gait training with trekking poles 2 x 180 ft, 2nd set focused on redirection of pt's attention which eased her apprehension of falling.     [facility] [place] Adult PT Treatment:                                                DATE: 10/27/2022 Therapeutic Exercise: Standing hip hiking 3 x 10 standing on RLE LLE sidelying hip abduction 2 x 10 with CW/CCW circles Prone calf stretch 2 x 30 for both gastroc/ soleus.  Updated HEP for standing hip hikes and sidelying hip abduction Manual Therapy: IASTM along the gastroc/ soleus and peroneals Sub-talar mobs grade III in prone for supination/ pronation Edema reduction massage in prone  Trigger Point Dry-Needling  Treatment instructions: Expect mild to moderate muscle soreness. S/S of pneumothorax if dry needled over a lung field, and to seek immediate medical attention should they occur. Patient verbalized understanding of these instructions and education.  Patient Consent Given: Yes Education handout provided: Previously provided Muscles treated: peroneal longus/ brevis, gastro Electrical stimulation performed: No Parameters: N/A Treatment response/outcome: twitch response, and muscle lengthening.   Neuromuscular re-ed: Gait training rocking forward with PF/ backward with lead leg DF breaking up gait pattern rocking 10-15 times before advancing leg and peforming again  x 2 sets Gait training using mirror for visual feedback to reduce trendelenberg pattern with RLE stance    [facility] Adult PT Treatment:                                                DATE: 10-22-22 Therapeutic Exercise: Reviewed HEP for questions on LT ankle  inversion Sitting inversion with GTB 3 x 10 Heel raises BIL 20 x Toe Yoga GTB Eversion LT and RT ankle Sitting inversion with GTB 3 x 10 Seated toe raise and heel raise Neuromuscular re-ed: Single limb with UE support for 5 sec or less Tandem stance  Manual Therapy: Talocrural distraction grade III-IV with pt LT LE with pt in prone Subtalar mobs grade III for both for supination/ pronation in prone MTPR along gastroc/ soleus. Talocrural distraction grade III-IV with pt LT LE on mat table and then foot stool for wt bearing with body weight and PT providing mobilization Trigger Point Dry Needling Treatment: Pre-treatment instruction: Patient instructed on dry needling rationale, procedures, and possible side effects including pain during treatment (achy,cramping feeling), bruising, drop of blood, lightheadedness, nausea, sweating. Patient Consent Given: Yes Education handout provided: Yes Muscles treated: LT Gastroc lateral and medial  Needle size and number: .30x80m x 4  along lateral gastroc and medial lines Electrical stimulation performed: No Parameters: N/A Treatment response/outcome: Twitch response elicited and Palpable decrease in muscle tension Post-treatment instructions: Patient instructed to expect possible mild to moderate muscle soreness later today and/or tomorrow. Patient instructed in methods to reduce muscle soreness and to continue prescribed HEP. Patient was also educated on signs and symptoms of infection and to seek medical attention should they occur. Patient  verbalized understanding of these instructions and education.      PATIENT EDUCATION: updated 10/29/2022 Education details: for Edema reduction her husband could use lotion and perform the technique on her ankle while she is in prone. Discussed compression stocking to get something taut but comfortable(otherwise too tight may be counter productive) and could trial using ace wraps to see how she does with it before  purchasing compression stockings.  Person educated: Patient Education method: Explanation Education comprehension: verbalized understanding and needs further education   HOME EXERCISE PROGRAM:  Access Code: [email or web address] URL: [email or web address] Date: 10/27/2022 Prepared by: Starr Lake  Program Notes Toe Yoga as shown in clinicDorsiflexion stretch on footstool with power band as shown in clinic.  Buy on [employer] with various strengths  Exercises - Seated Toe Raise  - 1 x daily - 7 x weekly - 2 sets - 10 reps - Seated Calf Stretch with Strap  - 1 x daily - 7 x weekly - 2 sets - 3 reps - 30 hold - Standing 3-Way Leg Reach with Resistance at Ankles and Counter Support  - 1 x daily - 7 x weekly - 3 sets - 10 reps - Sit to stand with sink support Movement snack  - 1 x daily - 7 x weekly - 3 sets - 10 reps - Single Leg Stance with Support  - 2 x daily - 7 x weekly - 1 sets - 3-5 reps - hold 15 sec hold - Heel raise with counter support and towel under toes  - 1 x daily - 7 x weekly - 3 sets - 10 reps - Seated Figure 4 Ankle Inversion with Resistance  - 1 x daily - 7 x weekly - 3 sets - 10 reps - Seated Ankle Eversion with Resistance  - 1 x daily - 7 x weekly - 3 sets - 10 reps - Hip Hiking on Step  - 1 x daily - 7 x weekly - 3 sets - 10 reps - Sidelying Hip Abduction  - 1 x daily - 7 x weekly - 3 sets - 12 reps  ASSESSMENT:   CLINICAL IMPRESSION: Pt arrives to session with continued swelling in the L ankle. Continued TPDN focusing on gastroc/ soleus and proximal peroneals followed with IASTM and talorural/ subtalar mobs. Continued working on LLE both proximal and distal strengthening which she did well with. Practiced gait training using trekking poles which she exhibited apprehension of falling, utilized redirection of focus which she visually relaxed throughout remainder of gait training. End of session she continues to note feeling the edema to be reduced.     OBJECTIVE IMPAIRMENTS: Abnormal gait, decreased balance, difficulty walking, decreased ROM, decreased strength, hypomobility, obesity, and pain.    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    REHAB POTENTIAL: Good   CLINICAL DECISION MAKING: Evolving/moderate complexity   EVALUATION COMPLEXITY: Moderate     GOALS: Goals reviewed with patient? No   SHORT TERM GOALS: Target date: 10-29-21 Pt will be independent with intial HEP Baseline:no knowledge,  10-07-22 initial HEP given  Goal status: MET   2.  Pt will be able perform STS with even wt distribution in bil LE Baseline: Pt wt bears to RT in STS Goal  status: MET   3.  Pt pain with functional activities in Great toe will be reduced to 5/10  Baseline: 9/10 10-15-22  3/10 Goal status: MET     LONG TERM GOALS: Target date: 11-26-22   Pt will be independent with advanced HEP.  Baseline:  Goal status: ONGOING   2.  FOTO will improve from  51%  to   64%  indicating improved functional mobility.  Baseline: eval 51% 10-20-22 73% Goal status: MET   3.  Pain will decrease to 3/10 with all functional activities from 9/10 Baseline: Pt max pain after functional activities. 9/10 Goal status: ONGOING   4.  Pt will be able to perform 6MWT within normal range for age Baseline: 824 ft 1290-1755 ft Goal status:ONGOING   5.  Pt will be able to perform balance with SLS for at least 30 sec on RT and LT to show improved balance/strength in SL activities Baseline: RT 23 sec  LT 10 sec tries to use UE 10-20-22 < 3 sec BIL today Goal status: ONGOING   6.  Pt will be able to perform  floor to stand transfer to show  increased strength and safe fall preparedness  Baseline: unable to perform Goal status: INITIAL    7. Demonstrate 4 + /5 LT LE strength to improve stability, safety and endurance of community level function            Baseline: 4-/5 10-20-22  See flow chart            Goal status: ONGOING       PLAN:   PT FREQUENCY: 1-2x/week   PT DURATION: 8 weeks   PLANNED INTERVENTIONS: Therapeutic exercises, Therapeutic activity, Neuromuscular re-education, Balance training, Gait training, Patient/Family education, Self Care, Joint mobilization, Stair training, Dry Needling, Electrical stimulation, Cryotherapy, Moist heat, Taping, Ionotophoresis 44m/ml Dexamethasone, Manual therapy, and Re-evaluation   PLAN FOR NEXT SESSION: TPDN, Manual, Response to edema reduction.   May add prone quad stretch to HBayview Medical Center IncPT, DPT, LAT, ATC  10/29/22  4:41 PM

## 2022-11-02 ENCOUNTER — Ambulatory Visit: Payer: Medicare Other | Admitting: Physical Therapy

## 2022-11-02 DIAGNOSIS — M25672 Stiffness of left ankle, not elsewhere classified: Secondary | ICD-10-CM | POA: Diagnosis not present

## 2022-11-02 DIAGNOSIS — M79675 Pain in left toe(s): Secondary | ICD-10-CM | POA: Diagnosis not present

## 2022-11-02 DIAGNOSIS — R262 Difficulty in walking, not elsewhere classified: Secondary | ICD-10-CM | POA: Diagnosis not present

## 2022-11-02 NOTE — Therapy (Signed)
OUTPATIENT PHYSICAL THERAPY TREATMENT NOTE Progress Note Reporting Period 10/02/2023 to 11/02/2022  See note below for Objective Data and Assessment of Progress/Goals.       Patient Name: Elizabeth Wang MRN: [id number] DOB:1946-10-10, 77 y.o., female Today's Date: 11/02/2022  PCP: Ginger Organ. MD  REFERRING PROVIDER: Armond Hang, MD    END OF SESSION:   PT End of Session - 11/02/22 1103     Visit Number 10    Number of Visits 16    Date for PT Re-Evaluation 11/26/22    Authorization Type MCR/ BCBS  6th and 10th FOTO  10th note progress note    Progress Note Due on Visit 20    PT Start Time 1103    PT Stop Time 1150    PT Time Calculation (min) 47 min    Activity Tolerance Patient tolerated treatment well    Behavior During Therapy WFL for tasks assessed/performed                    Past Medical History:  Diagnosis Date   Acute pulmonary embolism ([place]) 06/2013   Arthritis    Asthma    years ago    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   Paroxysmal atrial fibrillation (HCC)    1 isolated episode in the setting of acute stress with no further episodes.    Pseudogout    Past Surgical History:  Procedure Laterality Date   BREAST RECONSTRUCTION     CARDIOVERSION N/A 06/27/2020   Procedure: CARDIOVERSION;  Surgeon: [name], MD;  Location: [facility];  Service: Cardiovascular;  Laterality: N/A;   CESAREAN SECTION     COLONOSCOPY  2004   repeat in ten years   KNEE ARTHROSCOPY Left    TONSILLECTOMY     TOTAL KNEE ARTHROPLASTY Right 01/01/2014   Procedure: TOTAL RIGHT KNEE ARTHROPLASTY;  Surgeon: [name], MD;  Location: [facility];  Service: Orthopedics;  Laterality: Right;   TOTAL KNEE  ARTHROPLASTY Left 07/23/2014   Procedure: LEFT TOTAL KNEE ARTHROPLASTY;  Surgeon: [name], MD;  Location: [facility];  Service: Orthopedics;  Laterality: Left;   tummy tuck  1987   WISDOM TOOTH EXTRACTION     Patient Active Problem List   Diagnosis Date Noted   Closed fracture of proximal phalanx of lesser toe of right foot 09/01/2022   Osteoarthritis of talonavicular joint 09/01/2022   Closed fracture of fifth metatarsal bone 09/05/2021   Contusion of left knee 04/05/2019   Pain in left knee 04/03/2019   Osteoarthritis of right glenohumeral joint 11/24/2017   Pain of left great toe 12/17/2016   Skin ulcer of left great toe, limited to breakdown of skin ([place]) 12/17/2016   Gout 01/12/2014   S/P total knee arthroplasty 01/11/2014   Hypokalemia 01/03/2014   Hyponatremia 01/03/2014   Postoperative anemia due to acute blood loss 01/02/2014   Hypothyroidism 01/02/2014   Other and unspecified hyperlipidemia 01/02/2014   OA (osteoarthritis) of knee 01/01/2014   Essential hypertension, benign 11/28/2013   Pulmonary embolism ([place]) 11/28/2013   DVT (deep venous thrombosis) ([place]) 08/09/2013  Other pulmonary embolism and infarction 08/09/2013   Persistent atrial fibrillation ([place]) 07/14/2013    REFERRING DIAG: M25.572 (ICD-10-CM) - Pain in left ankle and joints of left foot    THERAPY DIAG:  Difficulty in walking, not elsewhere classified  Stiffness of left ankle, not elsewhere classified  Rationale for Evaluation and Treatment Rehabilitation  PERTINENT HISTORY: 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 wh  PAIN:  10-22-22  LT Ankle 0-1/10,  LT great Toe 2/10 pain. 10-20-22 Ankle 2/10,  Toe 9/10 pain. 10-15-22 Great toe LT 3/10, 4-5/10 ankle LT 10-08-22  0/10 pain Are you having pain? Yes: NPRS scale: 0/10 Pain location: great toe LT,  unable to descend 1st ray Pain description: dull ache Aggravating factors: walking on it when I walk > 7000 steps,  just living,  sometimes just throbbing, steps Relieving factors: wearing a toe pad, salonpas topical No pain in my left ankle, uncomfortable because it does not bend   OBJECTIVE: (objective measures completed at initial evaluation unless otherwise dated)   DIAGNOSTIC FINDINGS: x rays recently taken but not available at [place]:  FOTO 51%  predicted 64% 10-20-22 73% 11/02/2022 49%   COGNITION: Overall cognitive status: Within functional limits for tasks assessed                         SENSATION: WFL   EDEMA:  Figure 8: RT 54 cm, LT 61 cm 10-15-22 10:30 AM  LT 59.5 cm   MUSCLE LENGTH: Hamstrings: Right 53 deg; Left 50 deg  10-15-22 LLD 31 in ASIS to malleolus LT  RT 31.75 inch     POSTURE: rounded shoulders, forward head, flexed trunk , and obesity   PALPATION: Not pain but increased stiffness / blocked talar glide   LOWER EXTREMITY ROM:   Active ROM Right eval Left eval LT 10-15-22 10-20-22 LT  Hip flexion        Hip extension        Hip abduction        Hip adduction        Hip internal rotation        Hip  external rotation        Knee flexion        Knee extension        Ankle dorsiflexion 5 +9 from neutral 0 0  Ankle plantarflexion 65 51  55  Ankle inversion '27 14  25  '$ Ankle eversion '20 14  15  '$ Great toe distal flexion 50 25  39  Great toe  extension 45 10  10   (Blank rows = not tested)   LOWER EXTREMITY MMT:   MMT Right eval Left eval RT/LT 10-20-22 RT/ LT 11/02/2022  Hip flexion 4+ 4 4+/4 4+/4+  Hip  extension 4 4- 4/4 4/4  Hip abduction 4- 4- 4-/4- 4-/4-  Hip adduction        Hip internal rotation        Hip external rotation        Knee flexion        Knee extension 5 4 5/4+   Ankle dorsiflexion 5 4 5/4- 5/4+  Ankle plantarflexion        Ankle inversion 4+ 4- 4+/4 5/ 4+  Ankle eversion 4+ 4- 4+/4 5/4+   (Blank rows = not tested)       FUNCTIONAL TESTS:  5 times sit to stand: 11.15 6 MWT 12-21-23824 ft 1290-1755 ft            Sustained bridge  32 seconds             SLS RT 23 sec  LT 10sec but pain   [facility] Signs Eval (12-7- 2023) 10-15-22  10-20-22         5 x  STS 11.73 sec but wt bears to RT 7.66 sec wt bears to RT   9.66 sec with equal wt bear        Max pain LT great toe 9/10 3/10  3/10 most of the time but occassionally 9/10 as in today         6MWT   824 ft 1290-1755 ft NT          sustained bridge  32 sec    Cramping in hamstring NT          SLS RT 23/LT 10 sec  RT 23 LT 7 sec  RT < 3  LT < 3 checked with and without shoes        DF LT  DF 0 LT DF- 0 LT         GAIT: Distance walked: 150 Assistive device utilized: None Level of assistance: Complete Independence Comments: antalgic gait left, with trunk lean to RT Pt with hammer toes and ulcer on LT great toe healing , with an extension pad.  Pt is unable to depress 1st ray with gait in stance/ push off phase, Pt tends to toe grip with shoes and is wearing more sneakers rather than sandals presently.    TODAY'S TREATMENT:  [facility] Adult PT Treatment:                                                DATE: 11/02/2021 Therapeutic Exercise: Revers clam shell 2 x 10 Clamshell 2 x 15 Manual Therapy: MTRP along the R glute med  Tack and stretch of the R glute med LAD RLE grade IV with oscillations LLE talocrural LAD grade III Therapeutic Activity: Reviewed MMT results and impact on  ADLs and benefit of strengthening. Additionally utilizing 1 rest day a week to allow for muscle recovery and continue to further  strength.   [facility] Adult PT Treatment:                                                DATE: 10/29/2021 Therapeutic Exercise: Seated hip ER with RTB 3 x 10 Ankle posterior tib strengthening 3 x10  Manual Therapy: IASTM along the gastroc/ soleus and peroneals Sub-talar mobs grade III in prone for supination/ pronation, talocural LAD grad III Edema reduction massage in prone  Trigger Point Dry-Needling  Treatment instructions: Expect mild to moderate muscle soreness. S/S of pneumothorax if dry needled over a lung field, and to seek immediate medical attention should they occur. Patient verbalized understanding of these instructions and education.  Patient Consent Given: Yes Education handout provided: Previously provided Muscles treated: peroneal longus/ brevis, gastro Electrical stimulation performed: No Parameters: N/A Treatment response/outcome: twitch response, and muscle lengthening.   Neuromuscular re-ed: Gait training with trekking poles 2 x 180 ft, 2nd set focused on redirection of pt's attention which eased her apprehension of falling.     [facility] Adult PT Treatment:                                                DATE: 10/27/2022 Therapeutic Exercise: Standing hip hiking 3 x 10 standing on RLE LLE sidelying hip abduction 2 x 10 with CW/CCW circles Prone calf stretch 2 x 30 for both gastroc/ soleus.  Updated HEP for standing hip hikes and sidelying hip abduction Manual Therapy: IASTM along the gastroc/ soleus and peroneals Sub-talar mobs grade III in prone for supination/ pronation Edema reduction massage in prone  Trigger Point Dry-Needling  Treatment instructions: Expect mild to moderate muscle soreness. S/S of pneumothorax if dry needled over a lung field, and to seek immediate medical attention should they occur. Patient verbalized understanding of these instructions and education.  Patient Consent Given: Yes Education handout provided: Previously provided Muscles  treated: peroneal longus/ brevis, gastro Electrical stimulation performed: No Parameters: N/A Treatment response/outcome: twitch response, and muscle lengthening.   Neuromuscular re-ed: Gait training rocking forward with PF/ backward with lead leg DF breaking up gait pattern rocking 10-15 times before advancing leg and peforming again  x 2 sets Gait training using mirror for visual feedback to reduce trendelenberg pattern with RLE stance       PATIENT EDUCATION: updated 10/29/2022 Education details: for Edema reduction her husband could use lotion and perform the technique on her ankle while she is in prone. Discussed compression stocking to get something taut but comfortable(otherwise too tight may be counter productive) and could trial using ace wraps to see how she does with it before purchasing compression stockings.  Person educated: Patient Education method: Explanation Education comprehension: verbalized understanding and needs further education   HOME EXERCISE PROGRAM:  Access Code: [email or web address] URL: [email or web address] Date: 10/27/2022 Prepared by: Starr Lake  Program Notes Toe Yoga as shown in clinicDorsiflexion stretch on footstool with power band as shown in clinic.  Buy on [employer] with various strengths  Exercises - Seated Toe Raise  - 1 x daily - 7 x weekly - 2 sets - 10 reps -  Seated Calf Stretch with Strap  - 1 x daily - 7 x weekly - 2 sets - 3 reps - 30 hold - Standing 3-Way Leg Reach with Resistance at Ankles and Counter Support  - 1 x daily - 7 x weekly - 3 sets - 10 reps - Sit to stand with sink support Movement snack  - 1 x daily - 7 x weekly - 3 sets - 10 reps - Single Leg Stance with Support  - 2 x daily - 7 x weekly - 1 sets - 3-5 reps - hold 15 sec hold - Heel raise with counter support and towel under toes  - 1 x daily - 7 x weekly - 3 sets - 10 reps - Seated Figure 4 Ankle Inversion with Resistance  - 1 x daily - 7 x weekly - 3  sets - 10 reps - Seated Ankle Eversion with Resistance  - 1 x daily - 7 x weekly - 3 sets - 10 reps - Hip Hiking on Step  - 1 x daily - 7 x weekly - 3 sets - 10 reps - Sidelying Hip Abduction  - 1 x daily - 7 x weekly - 3 sets - 12 reps  ASSESSMENT:   CLINICAL IMPRESSION: Mrs Spitzley arrives to PT today noting over the weekend she had a reoccurrence of R sided sciatica and additionally reported increased pain located at the L great toe. She is making progress toward her goals however based on the pain plan to reassess 6 min walk and single leg balance next session. She noted difficulty with standing 3-way hip strengthening with limited ability to stand on the RLE safely. Reviewed HEP which she visually demonstrated apprehension with the exercise. Worked on STW / MTPR along the R glute med which she did have multiple trigger points and demonstrated improvement with the exercise when reassessing following treatment. She continues to make progress with physical therapy and would benefit from continued physical therapy to decrease L foot/ ankle pain, improve gait biomechanics.    OBJECTIVE IMPAIRMENTS: Abnormal gait, decreased balance, difficulty walking, decreased ROM, decreased strength, hypomobility, obesity, and pain.    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    REHAB POTENTIAL: Good   CLINICAL DECISION MAKING: Evolving/moderate complexity   EVALUATION COMPLEXITY: Moderate     GOALS: Goals reviewed with patient? No   SHORT TERM GOALS: Target date: 10-29-21 Pt will be independent with intial HEP Baseline:no knowledge,  10-07-22  initial HEP given  Goal status: MET   2.  Pt will be able perform STS with even wt distribution in bil LE Baseline: Pt wt bears to RT in STS Goal status: MET   3.  Pt pain with functional activities in Great toe will be reduced to 5/10  Baseline: 9/10 10-15-22  3/10 Goal status: MET     LONG TERM GOALS: Target date: 11-26-22   Pt will be independent with advanced HEP.  Baseline:  Goal status: ONGOING 11/02/2022   2.  FOTO will improve from  51%  to   64%  indicating improved functional mobility.  Baseline: eval 51% 10-20-22 73% Goal status: MET   3.  Pain will decrease to 3/10 with all functional activities from 9/10 Baseline:  Pt max pain after functional activities. 9/10 Status: continues to have pain Goal status: ONGOING 11/02/2022   4.  Pt will be able to perform 6MWT within normal range for age Baseline: 824 ft 1290-1755 ft Goal status:ONGOING 11/02/2022   5.  Pt will be able to perform balance with SLS for at least 30 sec on RT and LT to show improved balance/strength in SL activities Baseline: RT 23 sec  LT 10 sec tries to use UE 10-20-22 < 3 sec BIL today Status:  Goal status: ONGOING 11/02/2022   6.  Pt will be able to perform floor to stand transfer to show  increased strength and safe fall preparedness  Baseline: unable to perform Status: unable to perform  Goal status: ONGOING 11/02/2022    7. Demonstrate 4 + /5 LT LE strength to improve stability, safety and endurance of community level function            Baseline: 4-/5 10-20-22  See flow chart            Goal status: ONGOING 11/02/2022       PLAN:   PT FREQUENCY: 1-2x/week   PT DURATION: 8 weeks   PLANNED INTERVENTIONS: Therapeutic exercises, Therapeutic activity, Neuromuscular re-education, Balance training, Gait training, Patient/Family education, Self Care, Joint mobilization, Stair training, Dry Needling, Electrical stimulation, Cryotherapy, Moist heat, Taping, Ionotophoresis '4mg'$ /ml Dexamethasone, Manual  therapy, and Re-evaluation   PLAN FOR NEXT SESSION: TPDN, Manual, Response to edema reduction.   May add prone quad stretch to HEP  Assess 6 min walk and single leg balance next session.     Dusti Tetro PT, DPT, LAT, ATC  11/02/22  12:18 PM

## 2022-11-03 ENCOUNTER — Encounter: Payer: Medicare Other | Admitting: Physical Therapy

## 2022-11-05 ENCOUNTER — Encounter: Payer: Self-pay | Admitting: Physical Therapy

## 2022-11-05 ENCOUNTER — Ambulatory Visit: Payer: Medicare Other | Admitting: Physical Therapy

## 2022-11-05 DIAGNOSIS — R262 Difficulty in walking, not elsewhere classified: Secondary | ICD-10-CM

## 2022-11-05 DIAGNOSIS — M25672 Stiffness of left ankle, not elsewhere classified: Secondary | ICD-10-CM | POA: Diagnosis not present

## 2022-11-05 DIAGNOSIS — M79675 Pain in left toe(s): Secondary | ICD-10-CM | POA: Diagnosis not present

## 2022-11-05 NOTE — Therapy (Signed)
OUTPATIENT PHYSICAL THERAPY TREATMENT NOTE    Patient Name: Elizabeth Wang MRN: [id number] DOB:06-17-1946, 77 y.o., female Today's Date: 11/05/2022  PCP: Ginger Organ. 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   Paroxysmal atrial fibrillation (HCC)    1 isolated episode in the setting of acute stress with no further episodes.    Pseudogout    Past Surgical History:  Procedure Laterality Date   BREAST RECONSTRUCTION     CARDIOVERSION N/A 06/27/2020   Procedure: CARDIOVERSION;  Surgeon: [name], MD;  Location: [facility];  Service: Cardiovascular;  Laterality: N/A;   CESAREAN SECTION     COLONOSCOPY  2004   repeat in ten years   KNEE ARTHROSCOPY Left    TONSILLECTOMY     TOTAL KNEE ARTHROPLASTY Right 01/01/2014   Procedure: TOTAL RIGHT KNEE ARTHROPLASTY;  Surgeon: [name], MD;  Location: [facility];  Service: Orthopedics;  Laterality: Right;   TOTAL KNEE ARTHROPLASTY Left 07/23/2014   Procedure: LEFT TOTAL KNEE ARTHROPLASTY;  Surgeon: [name], MD;  Location: [facility];  Service: Orthopedics;  Laterality: Left;   tummy tuck  1987   WISDOM TOOTH EXTRACTION      Patient Active Problem List   Diagnosis Date Noted   Closed fracture of proximal phalanx of lesser toe of right foot 09/01/2022   Osteoarthritis of talonavicular joint 09/01/2022   Closed fracture of fifth metatarsal bone 09/05/2021   Contusion of left knee 04/05/2019   Pain in left knee 04/03/2019   Osteoarthritis of right glenohumeral joint 11/24/2017   Pain of left great toe 12/17/2016   Skin ulcer of left great toe, limited to breakdown of skin ([place]) 12/17/2016   Gout 01/12/2014   S/P total knee arthroplasty 01/11/2014   Hypokalemia 01/03/2014   Hyponatremia 01/03/2014   Postoperative anemia due to acute blood loss 01/02/2014   Hypothyroidism 01/02/2014   Other and unspecified hyperlipidemia 01/02/2014   OA (osteoarthritis) of knee 01/01/2014   Essential hypertension, benign 11/28/2013   Pulmonary embolism ([place]) 11/28/2013   DVT (deep venous thrombosis) ([place]) 08/09/2013   Other pulmonary embolism and infarction 08/09/2013   Persistent atrial fibrillation ([place]) 07/14/2013    REFERRING DIAG: M25.572 (ICD-10-CM) - Pain in left ankle and joints of left foot    THERAPY DIAG:  No diagnosis found.  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 I was told to let my PT's know to avoid trying to address it to prevent issues especially with my husbands upcoming surgery."  PAIN:   Are you having pain? Yes: NPRS scale: 0/10 Pain location: great toe LT,  unable to descend 1st ray Pain description: dull  ache Aggravating factors: walking on it when I walk > 7000 steps,  just living,  sometimes just throbbing, steps Relieving factors: wearing a toe pad, salonpas topical No pain in my left ankle, uncomfortable because it does not bend   OBJECTIVE: (objective measures completed at initial evaluation unless otherwise dated)   DIAGNOSTIC FINDINGS: x rays recently taken but not available at [place]:  FOTO 51%  predicted 64% 10-20-22 73% 11/02/2022 49%   COGNITION: Overall cognitive status: Within functional limits for tasks assessed                         SENSATION: WFL   EDEMA:  Figure 8: RT 54 cm, LT 61 cm 10-15-22 10:30 AM  LT 59.5 cm   MUSCLE LENGTH: Hamstrings: Right 53 deg; Left 50 deg  10-15-22 LLD 31 in ASIS to malleolus LT  RT 31.75 inch     POSTURE: rounded shoulders, forward head, flexed trunk , and obesity   PALPATION: Not pain but increased stiffness / blocked talar glide   LOWER EXTREMITY ROM:   Active ROM Right eval Left eval LT 10-15-22 10-20-22 LT  Hip flexion        Hip extension        Hip abduction        Hip adduction        Hip internal rotation        Hip external rotation        Knee flexion        Knee extension        Ankle dorsiflexion 5 +9 from neutral 0 0  Ankle plantarflexion 65 51  55  Ankle inversion '27 14  25  '$ Ankle eversion '20 14  15  '$ Great toe distal flexion 50 25  39  Great toe  extension 45  10  10   (Blank rows = not tested)   LOWER EXTREMITY MMT:   MMT Right eval Left eval RT/LT 10-20-22 RT/ LT 11/02/2022  Hip flexion 4+ 4 4+/4 4+/4+  Hip extension 4 4- 4/4 4/4  Hip abduction 4- 4- 4-/4- 4-/4-  Hip adduction        Hip internal rotation        Hip external rotation        Knee flexion        Knee extension 5 4 5/4+   Ankle dorsiflexion 5 4 5/4- 5/4+  Ankle plantarflexion        Ankle inversion 4+ 4- 4+/4 5/ 4+  Ankle eversion 4+ 4- 4+/4 5/4+   (Blank rows = not tested)       FUNCTIONAL  TESTS:  5 times sit to stand: 11.15 6 MWT [id number] ft 1290-1755 ft            Sustained bridge  32 seconds             SLS RT 23 sec  LT 10sec but pain   [facility] Signs Eval (12-7- 2023) 10-15-22  10-20-22  11/05/2022       5 x  STS 11.73 sec but wt bears to RT 7.66 sec wt bears to RT   9.66 sec with equal wt bear  9 secs      Max pain LT great toe 9/10 3/10  3/10 most of the time but occassionally 9/10 as in today         6MWT   824 ft 1290-1755 ft NT   966 ft       sustained bridge  32 sec    Cramping in hamstring NT          SLS RT 23/LT 10 sec  RT 23 LT 7 sec  RT < 3  LT < 3 checked with and without shoes        DF LT  DF 0 LT DF- 0 LT         GAIT: Distance walked: 150 Assistive device utilized: None Level of assistance: Complete Independence Comments: antalgic gait left, with trunk lean to RT Pt with hammer toes and ulcer on LT great toe healing , with an extension pad.  Pt is unable to depress 1st ray with gait in stance/ push off phase, Pt tends to toe grip with shoes and is wearing more sneakers rather than sandals presently.    TODAY'S TREATMENT:   [facility] [facility] Adult PT Treatment:                                                DATE: 11/05/22 Therapeutic Exercise: Standing arch building exercise in //  Neuromuscular re-ed: Gait training in // with arch tape and verbal and visual cues for proper form/ technique performing multiple times to promote intrinsic feedback and practice walking outside of the //.  Manual Therapy: Arch taping on the L to promote ankle support  Therapeutic Activity: 6 min walk increased to 966 ft 5 x STS 9 seconds   [facility] Adult PT Treatment:  DATE: 11/02/2021 Therapeutic Exercise: Revers clam shell 2 x 10 Clamshell 2 x 15 Manual Therapy: MTRP along the R glute med  Tack and stretch of the R glute med LAD RLE grade IV with oscillations LLE talocrural LAD grade III Therapeutic Activity: Reviewed MMT  results and impact on ADLs and benefit of strengthening. Additionally utilizing 1 rest day a week to allow for muscle recovery and continue to further strength.   [facility] Adult PT Treatment:                                                DATE: 10/29/2021 Therapeutic Exercise: Seated hip ER with RTB 3 x 10 Ankle posterior tib strengthening 3 x10  Manual Therapy: IASTM along the gastroc/ soleus and peroneals Sub-talar mobs grade III in prone for supination/ pronation, talocural LAD grad III Edema reduction massage in prone  Trigger Point Dry-Needling  Treatment instructions: Expect mild to moderate muscle soreness. S/S of pneumothorax if dry needled over a lung field, and to seek immediate medical attention should they occur. Patient verbalized understanding of these instructions and education.  Patient Consent Given: Yes Education handout provided: Previously provided Muscles treated: peroneal longus/ brevis, gastro Electrical stimulation performed: No Parameters: N/A Treatment response/outcome: twitch response, and muscle lengthening.   Neuromuscular re-ed: Gait training with trekking poles 2 x 180 ft, 2nd set focused on redirection of pt's attention which eased her apprehension of falling.     PATIENT EDUCATION: updated 10/29/2022 Education details: for Edema reduction her husband could use lotion and perform the technique on her ankle while she is in prone. Discussed compression stocking to get something taut but comfortable(otherwise too tight may be counter productive) and could trial using ace wraps to see how she does with it before purchasing compression stockings.  Person educated: Patient Education method: Explanation Education comprehension: verbalized understanding and needs further education   HOME EXERCISE PROGRAM:  Access Code: [email or web address] URL: [email or web address] Date: 10/27/2022 Prepared by: Starr Lake  Program Notes Toe Yoga as shown in  clinicDorsiflexion stretch on footstool with power band as shown in clinic.  Buy on [employer] with various strengths  Exercises - Seated Toe Raise  - 1 x daily - 7 x weekly - 2 sets - 10 reps - Seated Calf Stretch with Strap  - 1 x daily - 7 x weekly - 2 sets - 3 reps - 30 hold - Standing 3-Way Leg Reach with Resistance at Ankles and Counter Support  - 1 x daily - 7 x weekly - 3 sets - 10 reps - Sit to stand with sink support Movement snack  - 1 x daily - 7 x weekly - 3 sets - 10 reps - Single Leg Stance with Support  - 2 x daily - 7 x weekly - 1 sets - 3-5 reps - hold 15 sec hold - Heel raise with counter support and towel under toes  - 1 x daily - 7 x weekly - 3 sets - 10 reps - Seated Figure 4 Ankle Inversion with Resistance  - 1 x daily - 7 x weekly - 3 sets - 10 reps - Seated Ankle Eversion with Resistance  - 1 x daily - 7 x weekly - 3 sets - 10 reps - Hip Hiking on Step  - 1 x daily - 7 x weekly - 3 sets - 10  reps - Sidelying Hip Abduction  - 1 x daily - 7 x weekly - 3 sets - 12 reps  ASSESSMENT:   CLINICAL IMPRESSION: Pt arrives to session noting she had an increase in RLE sciatica and reported she had seen her other PT provider and requested to avoid doing too much to reduce potentially causing a flare of her R sciatica. She increased her 5 x STS to 9 sec and her 6 min walk to 966 ft today. She does demonstrate increased trendelenberg gait pattern as a result of likely fatigue. Focused on ankle support with arch taping, gait training and arch building activities. She reported reduced tension in the R hip with the L arch support and noted improvement in pushing off on her great toe. Discussed if the tape is helpful then potentially getting orthotic for arch support may be beneficial.    OBJECTIVE IMPAIRMENTS: Abnormal gait, decreased balance, difficulty walking, decreased ROM, decreased strength, hypomobility, obesity, and pain.    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    REHAB POTENTIAL: Good   CLINICAL DECISION MAKING: Evolving/moderate complexity   EVALUATION COMPLEXITY: Moderate     GOALS: Goals reviewed with patient? No   SHORT TERM GOALS: Target date: 10-29-21 Pt will be independent with intial HEP Baseline:no knowledge,  10-07-22 initial HEP given  Goal status: MET   2.  Pt will be able perform STS with even wt distribution in bil LE Baseline: Pt wt bears to RT in STS Goal status: MET   3.  Pt pain with functional activities in Great toe will be reduced to 5/10  Baseline: 9/10 10-15-22  3/10 Goal status: MET     LONG TERM GOALS: Target date: 11-26-22   Pt will be independent with advanced HEP.  Baseline:  Goal status: ONGOING 11/02/2022   2.  FOTO will improve from  51%  to   64%  indicating improved functional mobility.  Baseline: eval 51% 10-20-22 73% Goal status: MET   3.  Pain will decrease to 3/10 with all functional activities from 9/10 Baseline: Pt max pain after functional activities. 9/10 Status: continues to have pain Goal status: ONGOING 11/02/2022   4.  Pt will be able to perform 6MWT within normal range for age Baseline: 824 ft 1290-1755 ft Goal status:ONGOING 11/02/2022   5.  Pt will be able to perform balance with SLS for at least 30 sec on RT and LT to show improved balance/strength in SL activities Baseline: RT 23 sec  LT 10 sec tries to use UE 10-20-22 < 3 sec BIL today Status:  Goal status: ONGOING 11/02/2022   6.  Pt will be able to perform floor to stand transfer to show  increased strength and safe fall preparedness  Baseline: unable to perform Status:  unable to perform  Goal status: ONGOING 11/02/2022    7. Demonstrate 4 + /5 LT LE strength to improve stability, safety and endurance of community level function            Baseline: 4-/5 10-20-22  See flow chart            Goal status: ONGOING 11/02/2022       PLAN:   PT FREQUENCY: 1-2x/week  PT DURATION: 8 weeks   PLANNED INTERVENTIONS: Therapeutic exercises, Therapeutic activity, Neuromuscular re-education, Balance training, Gait training, Patient/Family education, Self Care, Joint mobilization, Stair training, Dry Needling, Electrical stimulation, Cryotherapy, Moist heat, Taping, Ionotophoresis '4mg'$ /ml Dexamethasone, Manual therapy, and Re-evaluation   PLAN FOR NEXT SESSION: TPDN, Manual, Response to edema reduction.  Gait training, how was arch taping. R glute med strengthening.   May add prone quad stretch to [facility] PT, DPT, LAT, ATC  11/05/22  11:20 AM

## 2022-11-10 ENCOUNTER — Ambulatory Visit: Payer: Medicare Other | Admitting: Physical Therapy

## 2022-11-11 NOTE — Therapy (Signed)
OUTPATIENT PHYSICAL THERAPY TREATMENT NOTE    Patient Name: Elizabeth Wang MRN: [id number] DOB:06/26/1946, 77 y.o., female Today's Date: 11/12/2022  PCP: Ginger Organ. MD  REFERRING PROVIDER: Armond Hang, MD    END OF SESSION:   PT End of Session - 11/12/22 0902     Visit Number 12    Number of Visits 16    Date for PT Re-Evaluation 11/26/22    Authorization Type MCR/ BCBS    PT Start Time [phone]    PT Stop Time 0930    PT Time Calculation (min) 35 min    Activity Tolerance Patient tolerated treatment well                      Past Medical History:  Diagnosis Date   Acute pulmonary embolism ([place]) 06/2013   Arthritis    Asthma    years ago    Clotting disorder (HCC)    DVT/PE   Diverticulosis    minor    Dyslipidemia    Esophageal reflux    Hypertension    Hypothyroidism    Numbness    TOES   Obesity    OSA (obstructive sleep apnea)    severe OSA with an AHI of 51/hr and nocturnal hypoxemia with O2 sats at low as 69%.   Paroxysmal atrial fibrillation (HCC)    1 isolated episode in the setting of acute stress with no further episodes.    Pseudogout    Past Surgical History:  Procedure Laterality Date   BREAST RECONSTRUCTION     CARDIOVERSION N/A 06/27/2020   Procedure: CARDIOVERSION;  Surgeon: [name], MD;  Location: [facility];  Service: Cardiovascular;  Laterality: N/A;   CESAREAN SECTION     COLONOSCOPY  2004   repeat in ten years   KNEE ARTHROSCOPY Left    TONSILLECTOMY     TOTAL KNEE ARTHROPLASTY Right 01/01/2014   Procedure: TOTAL RIGHT KNEE ARTHROPLASTY;  Surgeon: [name], MD;  Location: [facility];  Service: Orthopedics;  Laterality: Right;   TOTAL KNEE ARTHROPLASTY Left 07/23/2014   Procedure: LEFT TOTAL KNEE ARTHROPLASTY;  Surgeon: [name], MD;  Location: [facility];  Service: Orthopedics;  Laterality: Left;   tummy tuck  1987   WISDOM TOOTH EXTRACTION     Patient Active Problem List   Diagnosis Date Noted    Closed fracture of proximal phalanx of lesser toe of right foot 09/01/2022   Osteoarthritis of talonavicular joint 09/01/2022   Closed fracture of fifth metatarsal bone 09/05/2021   Contusion of left knee 04/05/2019   Pain in left knee 04/03/2019   Osteoarthritis of right glenohumeral joint 11/24/2017   Pain of left great toe 12/17/2016   Skin ulcer of left great toe, limited to breakdown of skin ([place]) 12/17/2016   Gout 01/12/2014   S/P total knee arthroplasty 01/11/2014   Hypokalemia 01/03/2014   Hyponatremia 01/03/2014   Postoperative anemia due to acute blood loss 01/02/2014   Hypothyroidism 01/02/2014   Other and unspecified hyperlipidemia 01/02/2014   OA (osteoarthritis) of knee 01/01/2014   Essential hypertension, benign 11/28/2013   Pulmonary embolism ([place]) 11/28/2013   DVT (deep venous thrombosis) ([place]) 08/09/2013   Other pulmonary embolism and infarction 08/09/2013   Persistent atrial fibrillation ([place]) 07/14/2013    REFERRING DIAG: M25.572 (ICD-10-CM) - Pain in left ankle and joints of left foot    THERAPY DIAG:  Difficulty in walking, not elsewhere classified  Stiffness of left ankle, not  elsewhere classified  Pain of left great toe  Rationale for Evaluation and Treatment Rehabilitation  PERTINENT HISTORY: 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 I was seeing the podiatrist every 2 weeks but she went on maternity leave.  I have been seeing other people but I am getting worse with toe and it effects my walking.    PAIN:   11-12-22  0/10 but woke up with 9/10 today.  Are you having pain? Yes: NPRS scale: 0/10 Pain location: great toe LT,  unable to descend 1st ray Pain description: dull ache Aggravating factors: walking on it when I walk > 7000 steps,  just living,  sometimes just throbbing, steps Relieving factors: wearing a toe pad, salonpas topical No pain in my left ankle, uncomfortable because it does not bend   OBJECTIVE: (objective measures completed at initial evaluation unless otherwise dated)   DIAGNOSTIC FINDINGS: x rays recently taken but not available at [facility]:  FOTO 51%  predicted 64% 10-20-22 73% 11/02/2022 49%   COGNITION: Overall cognitive status: Within functional limits for tasks assessed                         SENSATION: WFL   EDEMA:  Figure 8: RT 54 cm, LT 61 cm 10-15-22 10:30 AM  LT 59.5 cm   MUSCLE LENGTH: Hamstrings: Right 53 deg; Left 50 deg  10-15-22 LLD 31 in ASIS to malleolus LT  RT 31.75 inch     POSTURE: rounded shoulders, forward head, flexed trunk , and obesity   PALPATION: Not pain but increased stiffness / blocked talar glide   LOWER EXTREMITY ROM:   Active ROM Right eval Left eval LT 10-15-22 10-20-22 LT  Hip flexion        Hip extension        Hip abduction        Hip adduction        Hip internal rotation        Hip external rotation        Knee flexion        Knee extension        Ankle dorsiflexion 5 +9  from neutral 0 0  Ankle plantarflexion 65 51  55  Ankle inversion '27 14  25  '$ Ankle eversion '20 14  15  '$ Great toe distal flexion 50 25  39  Great toe  extension 45 10  10   (Blank rows = not tested)   LOWER EXTREMITY MMT:   MMT Right eval Left eval RT/LT 10-20-22 RT/ LT 11/02/2022  Hip flexion 4+ 4 4+/4 4+/4+  Hip extension 4 4- 4/4 4/4  Hip abduction 4- 4- 4-/4- 4-/4-  Hip adduction        Hip internal rotation        Hip external rotation        Knee flexion        Knee extension 5 4 5/4+   Ankle  dorsiflexion 5 4 5/4- 5/4+  Ankle plantarflexion        Ankle inversion 4+ 4- 4+/4 5/ 4+  Ankle eversion 4+ 4- 4+/4 5/4+   (Blank rows = not tested)       FUNCTIONAL TESTS:  5 times sit to stand: 11.15 6 MWT [id number] ft 1290-1755 ft            Sustained bridge  32 seconds             SLS RT 23 sec  LT 10sec but pain   [facility] Signs Eval (12-7- 2023) 10-15-22  10-20-22  11/05/2022       5 x  STS 11.73 sec but wt bears to RT 7.66 sec wt bears to RT   9.66 sec with equal wt bear  9 secs      Max pain LT great toe 9/10 3/10  3/10 most of the time but occassionally 9/10 as in today         6MWT   824 ft 1290-1755 ft NT   966 ft       sustained bridge  32 sec    Cramping in hamstring NT          SLS RT 23/LT 10 sec  RT 23 LT 7 sec  RT < 3  LT < 3 checked with and without shoes        DF LT  DF 0 LT DF- 0 LT         GAIT: Distance walked: 150 Assistive device utilized: None Level of assistance: Complete Independence Comments: antalgic gait left, with trunk lean to RT Pt with hammer toes and ulcer on LT great toe healing , with an extension pad.  Pt is unable to depress 1st ray with gait in stance/ push off phase, Pt tends to toe grip with shoes and is wearing more sneakers rather than sandals presently.    TODAY'S TREATMENT:  [facility] Adult PT Treatment:                                                DATE: 11-12-22 Therapeutic Exercise: Woodpecker anterior  LT LE SL3 x 10 Woodpecker lateral LT LE SL 3 x 10 Woodpecker lateral LT LE SL 3 x 10  Neuromuscular re-ed: Gait training in // with arch tape and verbal and visual cues for proper form/ technique performing multiple times to promote intrinsic feedback and practice walking outside of the //.  In front of mobile mirror Manual Therapy: Arch taping on the L to promote ankle support  [facility] Adult PT Treatment:                                                DATE: 11/05/22 Therapeutic Exercise: Standing arch building exercise in //   Neuromuscular re-ed: Gait training in // with arch tape and verbal and visual cues for proper form/ technique performing multiple times to promote intrinsic feedback and practice walking outside of the //.  Manual Therapy: Arch taping on the L to promote ankle support  Therapeutic Activity: 6 min walk increased to 966 ft 5 x STS 9 seconds   [facility] Adult PT Treatment:                                                DATE: 11/02/2021 Therapeutic Exercise: Revers clam shell 2 x 10 Clamshell 2 x 15 Manual Therapy: MTRP along the R glute med  Tack and stretch of the R glute med LAD RLE grade IV with oscillations LLE talocrural LAD grade III Therapeutic Activity: Reviewed MMT results and impact on ADLs and benefit of strengthening. Additionally utilizing 1 rest day a week to allow for muscle recovery and continue to further strength.   [facility] Adult PT Treatment:                                                DATE: 10/29/2021 Therapeutic Exercise: Seated hip ER with RTB 3 x 10 Ankle posterior tib strengthening 3 x10  Manual Therapy: IASTM along the gastroc/ soleus and peroneals Sub-talar mobs grade III in prone for supination/ pronation, talocural LAD grad III Edema reduction massage in prone  Trigger Point Dry-Needling  Treatment instructions: Expect mild to moderate muscle soreness. S/S of pneumothorax if dry needled over a lung field, and to seek immediate medical attention should they occur. Patient verbalized understanding of these instructions and education.  Patient Consent Given: Yes Education handout provided: Previously provided Muscles treated: peroneal longus/ brevis, gastro Electrical stimulation performed: No Parameters: N/A Treatment response/outcome: twitch response, and muscle lengthening.   Neuromuscular re-ed: Gait training with trekking poles 2 x 180 ft, 2nd set focused on redirection of pt's attention which eased her apprehension of falling.     PATIENT EDUCATION:  updated 10/29/2022 Education details: for Edema reduction her husband could use lotion and perform the technique on her ankle while she is in prone. Discussed compression stocking to get something taut but comfortable(otherwise too tight may be counter productive) and could trial using ace wraps to see how she does with it before purchasing compression stockings.  Person educated: Patient Education method: Explanation Education comprehension: verbalized understanding and needs further education   HOME EXERCISE PROGRAM:  Access Code: [email or web address] URL: [email or web address] Date: 11/12/2022 Prepared by: [name]  Program Notes Toe Yoga as shown in clinicDorsiflexion stretch on footstool with power band as shown in clinic.  Buy on [employer] with various strengths  Exercises - Seated Toe Raise  - 1 x daily - 7 x weekly - 2 sets - 10 reps - Seated Calf Stretch with Strap  - 1 x daily - 7 x weekly -  2 sets - 3 reps - 30 hold - Standing 3-Way Leg Reach with Resistance at Ankles and Counter Support  - 1 x daily - 7 x weekly - 3 sets - 10 reps - Sit to stand with sink support Movement snack  - 1 x daily - 7 x weekly - 3 sets - 10 reps - Single Leg Stance with Support  - 2 x daily - 7 x weekly - 1 sets - 3-5 reps - hold 15 sec hold - Heel raise with counter support and towel under toes  - 1 x daily - 7 x weekly - 3 sets - 10 reps - Seated Figure 4 Ankle Inversion with Resistance  - 1 x daily - 7 x weekly - 3 sets - 10 reps - Seated Ankle Eversion with Resistance  - 1 x daily - 7 x weekly - 3 sets - 10 reps - Hip Hiking on Step  - 1 x daily - 7 x weekly - 3 sets - 10 reps - Sidelying Hip Abduction  - 1 x daily - 7 x weekly - 3 sets - 12 reps - Woodpecker one leg anterior, lateral and medial  - 1 x daily - 7 x weekly - 6 sets - 10 reps ASSESSMENT:   CLINICAL IMPRESSION: Pt arrives to session with husband with TKR this past 11-09-22.  She has not been doing her own exercises.  She does note that the tape has helped her with her gait. Her sciatica has calmed down but PT emphasized that she will need to work on her Gluteal muscles with exercises due to her trendelenberg gait.  Pt attended with husband  appt and husband waiting after his eval during her Rx.  Pt was told she needed to also prioritize her own self care and find respite so she would not worsen her own symptoms.  Exercises added to HEP to help with intrinsic foot muscle strength   OBJECTIVE IMPAIRMENTS: Abnormal gait, decreased balance, difficulty walking, decreased ROM, decreased strength, hypomobility, obesity, and pain.    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    REHAB POTENTIAL: Good   CLINICAL DECISION MAKING: Evolving/moderate complexity   EVALUATION COMPLEXITY: Moderate     GOALS: Goals reviewed with patient? No   SHORT TERM GOALS: Target date: 10-29-21 Pt will be independent with intial HEP Baseline:no knowledge,  10-07-22 initial HEP given  Goal status: MET   2.  Pt will be able perform STS with even wt distribution in bil LE Baseline: Pt wt bears to RT in STS Goal status: MET   3.  Pt pain with functional activities in Great toe will be reduced to 5/10  Baseline: 9/10 10-15-22  3/10 Goal status: MET     LONG TERM GOALS: Target date: 11-26-22   Pt will be independent with advanced HEP.  Baseline:  Goal status: ONGOING 11/02/2022   2.  FOTO will improve from  51%  to   64%  indicating improved functional mobility.  Baseline: eval 51% 10-20-22 73% Goal status: MET   3.  Pain will decrease to 3/10 with all functional activities  from 9/10 Baseline: Pt max pain after functional activities. 9/10 Status: continues to have pain Goal status: ONGOING 11/02/2022  4.  Pt will be able to perform 6MWT within normal range for age Baseline: 824 ft 1290-1755 ft Goal status:ONGOING 11/02/2022   5.  Pt will be able to perform balance with SLS for at least 30 sec on RT and LT to show improved balance/strength in SL activities Baseline: RT 23 sec  LT 10 sec tries to use UE 10-20-22 < 3 sec BIL today Status:  Goal status: ONGOING 11/02/2022   6.  Pt will be able to perform floor to stand transfer to show  increased strength and safe fall preparedness  Baseline: unable to perform Status: unable to perform  Goal status: ONGOING 11/02/2022    7. Demonstrate 4 + /5 LT LE strength to improve stability, safety and endurance of community level function            Baseline: 4-/5 10-20-22  See flow chart            Goal status: ONGOING 11/02/2022       PLAN:   PT FREQUENCY: 1-2x/week   PT DURATION: 8 weeks   PLANNED INTERVENTIONS: Therapeutic exercises, Therapeutic activity, Neuromuscular re-education, Balance training, Gait training, Patient/Family education, Self Care, Joint mobilization, Stair training, Dry Needling, Electrical stimulation, Cryotherapy, Moist heat, Taping, Ionotophoresis '4mg'$ /ml Dexamethasone, Manual therapy, and Re-evaluation   PLAN FOR NEXT SESSION: TPDN, Manual, Response to edema reduction.  Gait training, how was arch taping. R glute med strengthening.   May add prone quad stretch to [place], PT, [facility] Certified Exercise Expert for the Aging Adult  11/12/22 3:38 PM Phone: [phone] Fax: [phone]

## 2022-11-12 ENCOUNTER — Encounter: Payer: Self-pay | Admitting: Physical Therapy

## 2022-11-12 ENCOUNTER — Other Ambulatory Visit: Payer: Self-pay

## 2022-11-12 ENCOUNTER — Ambulatory Visit: Payer: Medicare Other | Admitting: Physical Therapy

## 2022-11-12 DIAGNOSIS — M25672 Stiffness of left ankle, not elsewhere classified: Secondary | ICD-10-CM | POA: Diagnosis not present

## 2022-11-12 DIAGNOSIS — R262 Difficulty in walking, not elsewhere classified: Secondary | ICD-10-CM

## 2022-11-12 DIAGNOSIS — M79675 Pain in left toe(s): Secondary | ICD-10-CM | POA: Diagnosis not present

## 2022-11-16 ENCOUNTER — Encounter: Payer: Self-pay | Admitting: Podiatry

## 2022-11-16 ENCOUNTER — Ambulatory Visit (INDEPENDENT_AMBULATORY_CARE_PROVIDER_SITE_OTHER): Payer: Medicare Other | Admitting: Podiatry

## 2022-11-16 DIAGNOSIS — L97522 Non-pressure chronic ulcer of other part of left foot with fat layer exposed: Secondary | ICD-10-CM

## 2022-11-16 DIAGNOSIS — G629 Polyneuropathy, unspecified: Secondary | ICD-10-CM

## 2022-11-16 NOTE — Progress Notes (Signed)
  Subjective:  Patient ID: Elizabeth Wang, female    DOB: 10-28-1945,   MRN: [id number]  No chief complaint on file.   77 y.o. female presents for follow-up of left hallux wound. Relates it was doing better but then reopened and has been back to dressing and using padding. .    Denies any other pedal complaints. Denies n/v/f/c.   Past Medical History:  Diagnosis Date   Acute pulmonary embolism ([place]) 06/2013   Arthritis    Asthma    years ago    Clotting disorder (HCC)    DVT/PE   Diverticulosis    minor    Dyslipidemia    Esophageal reflux    Hypertension    Hypothyroidism    Numbness    TOES   Obesity    OSA (obstructive sleep apnea)    severe OSA with an AHI of 51/hr and nocturnal hypoxemia with O2 sats at low as 69%.   Paroxysmal atrial fibrillation (HCC)    1 isolated episode in the setting of acute stress with no further episodes.    Pseudogout     Objective:  Physical Exam: Vascular: DP/PT pulses 2/4 bilateral. CFT <3 seconds. Normal hair growth on digits. No edema.  Skin. No lacerations or abrasions bilateral feet. Hyperkeratotic lesion noted to lateral third digit around nail plate and some incurvation of right third digit nail. Left hallux ulcer reopened measures 0.4 cm x 0.4 cm x 0.2 cm   No erythema edema or purulence noted.  Musculoskeletal: MMT 5/5 bilateral lower extremities in DF, PF, Inversion and Eversion. Deceased ROM in DF of ankle joint. Hallux malleus noted.  Neurological: Sensation intact to light touch.   Assessment:   1. Ulcer of great toe, left, with fat layer exposed ([place])   2. Neuropathy         Plan:  Patient was evaluated and treated and all questions answered. Ulcer left hallux wound with fat layer exposed.   -Ulcer debrided procedure below.   -Dressed with offloading pads.  -Off-loading with aperture pads. -Discussed avoiding any pedicures and staying off of this foot as much as possible.  -Discussed glucose control and proper  protein-rich diet.  -Discussed if any worsening redness, pain, fever or chills to call or may need to report to the emergency room. Patient expressed understanding.  -Discussed possible surgery to straighten hallux hammertoe to prevent future ulceration. Did discuss she is in PT now to work on ankle ROM and gait and we will see if this improves the ulceration.   Procedure: Excisional Debridement of Wound Rationale: Removal of non-viable soft tissue from the wound to promote healing.  Anesthesia: none Pre-Debridement Wound Measurements: Overlying callus  Post-Debridement Wound Measurements: 0.4 cm x 0.4 cm x 0.2 cm  Type of Debridement: Sharp Excisional Tissue Removed: Non-viable soft tissue Depth of Debridement: subcutaneous tissue. Technique: Sharp excisional debridement to bleeding, viable wound base.  Dressing: Dry, sterile, compression dressing. Disposition: Patient tolerated procedure well. Patient to return in 2 week for follow-up.  Return in about 2 weeks (around 11/30/2022) for wound check.   Follow-up in 4 weeks with Dr. Amalia Hailey to recheck wound healed.     No follow-ups on file.   Lorenda Peck, DPM

## 2022-11-19 NOTE — Therapy (Signed)
OUTPATIENT PHYSICAL THERAPY TREATMENT NOTE    Patient Name: Elizabeth Wang MRN: [id number] DOB:[birth year], 77 y.o., female Today's Date: 11/24/2022  PCP: Ginger Organ. MD  REFERRING PROVIDER: Armond Hang, MD    END OF SESSION:   PT End of Session - 11/24/22 1025     Visit Number 13    Number of Visits 16    Date for PT Re-Evaluation 11/26/22    Authorization Type MCR/ BCBS    Progress Note Due on Visit 57    PT Start Time 1026    PT Stop Time 1100    PT Time Calculation (min) 34 min    Activity Tolerance Patient tolerated treatment well    Behavior During Therapy WFL for tasks assessed/performed                       Past Medical History:  Diagnosis Date   Acute pulmonary embolism ([place]) 06/2013   Arthritis    Asthma    years ago    Clotting disorder ([place])    DVT/PE   Diverticulosis    minor    Dyslipidemia    Esophageal reflux    Hypertension    Hypothyroidism    Numbness    TOES   Obesity    OSA (obstructive sleep apnea)    severe OSA with an AHI of 51/hr and nocturnal hypoxemia with O2 sats at low as 69%.   Paroxysmal atrial fibrillation (HCC)    1 isolated episode in the setting of acute stress with no further episodes.    Pseudogout    Past Surgical History:  Procedure Laterality Date   BREAST RECONSTRUCTION     CARDIOVERSION N/A 06/27/2020   Procedure: CARDIOVERSION;  Surgeon: [name], MD;  Location: [facility];  Service: Cardiovascular;  Laterality: N/A;   CESAREAN SECTION     COLONOSCOPY  2004   repeat in ten years   KNEE ARTHROSCOPY Left    TONSILLECTOMY     TOTAL KNEE ARTHROPLASTY Right 01/01/2014   Procedure: TOTAL RIGHT KNEE ARTHROPLASTY;  Surgeon: [name], MD;  Location: [facility];  Service: Orthopedics;  Laterality: Right;   TOTAL KNEE ARTHROPLASTY Left 07/23/2014   Procedure: LEFT TOTAL KNEE ARTHROPLASTY;  Surgeon: [name], MD;  Location: [facility];  Service: Orthopedics;  Laterality: Left;   tummy tuck   1987   WISDOM TOOTH EXTRACTION     Patient Active Problem List   Diagnosis Date Noted   Closed fracture of proximal phalanx of lesser toe of right foot 09/01/2022   Osteoarthritis of talonavicular joint 09/01/2022   Closed fracture of fifth metatarsal bone 09/05/2021   Contusion of left knee 04/05/2019   Pain in left knee 04/03/2019   Osteoarthritis of right glenohumeral joint 11/24/2017   Pain of left great toe 12/17/2016   Skin ulcer of left great toe, limited to breakdown of skin ([place]) 12/17/2016   Gout 01/12/2014   S/P total knee arthroplasty 01/11/2014   Hypokalemia 01/03/2014   Hyponatremia 01/03/2014   Postoperative anemia due to acute blood loss 01/02/2014   Hypothyroidism 01/02/2014   Other and unspecified hyperlipidemia 01/02/2014   OA (osteoarthritis) of knee 01/01/2014   Essential hypertension, benign 11/28/2013   Pulmonary embolism ([place]) 11/28/2013   DVT (deep venous thrombosis) ([place]) 08/09/2013   Other pulmonary embolism and infarction 08/09/2013   Persistent atrial fibrillation ([place]) 07/14/2013    REFERRING DIAG: M25.572 (ICD-10-CM) - Pain in left ankle and joints of  left foot    THERAPY DIAG:  Difficulty in walking, not elsewhere classified  Stiffness of left ankle, not elsewhere classified  Pain of left great toe  Rationale for Evaluation and Treatment Rehabilitation  PERTINENT HISTORY: 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  My daughter's mother in law passed away and we are concerned and wonder if we should travel my airplane to attend  funeral.  PAIN:  11-24-22  ankle  1-2/10  toe  1-2/10  11-12-22  0/10 but woke up with 9/10 today.  Are you having pain? Yes: NPRS scale: 0/10 Pain location: great toe LT,  unable to descend 1st ray Pain description: dull ache Aggravating factors: walking on it when I walk > 7000 steps,  just living,  sometimes just throbbing, steps Relieving factors: wearing a toe pad, salonpas topical No pain in my left ankle, uncomfortable because it does not bend   OBJECTIVE: (objective measures completed at initial evaluation unless otherwise dated)   DIAGNOSTIC FINDINGS: x rays recently taken but not available at [facility]:  FOTO 51%  predicted 64% 10-20-22 73% 11/02/2022 49%   COGNITION: Overall cognitive status: Within functional limits for tasks assessed                         SENSATION: WFL   EDEMA:  Figure 8: RT 54 cm, LT 61 cm 10-15-22 10:30 AM  LT 59.5 cm   MUSCLE LENGTH: Hamstrings: Right 53 deg; Left 50 deg  10-15-22 LLD 31 in ASIS to malleolus LT  RT 31.75 inch     POSTURE: rounded shoulders, forward head, flexed trunk , and obesity   PALPATION: Not pain but increased stiffness / blocked talar glide   LOWER EXTREMITY ROM:   Active ROM Right eval Left eval LT 10-15-22 10-20-22 LT  Hip flexion        Hip extension        Hip abduction        Hip adduction        Hip internal rotation        Hip external rotation        Knee flexion        Knee extension  Ankle dorsiflexion 5 +9 from neutral 0 0  Ankle plantarflexion 65 51  55  Ankle inversion '27 14  25  '$ Ankle eversion '20 14  15  '$ Great toe distal flexion 50 25  39  Great toe  extension 45 10  10   (Blank rows = not tested)   LOWER EXTREMITY MMT:   MMT Right eval Left eval RT/LT 10-20-22 RT/ LT 11/02/2022  Hip flexion 4+ 4 4+/4 4+/4+  Hip extension 4 4- 4/4 4/4  Hip abduction 4- 4- 4-/4- 4-/4-  Hip adduction        Hip internal rotation        Hip external rotation        Knee  flexion        Knee extension 5 4 5/4+   Ankle dorsiflexion 5 4 5/4- 5/4+  Ankle plantarflexion        Ankle inversion 4+ 4- 4+/4 5/ 4+  Ankle eversion 4+ 4- 4+/4 5/4+   (Blank rows = not tested)       FUNCTIONAL TESTS:  5 times sit to stand: 11.15 6 MWT [id number] ft 1290-1755 ft            Sustained bridge  32 seconds             SLS RT 23 sec  LT 10sec but pain   [facility] Signs Eval (12-7- 2023) 10-15-22  10-20-22  11/05/2022  11-24-22     5 x  STS 11.73 sec but wt bears to RT 7.66 sec wt bears to RT   9.66 sec with equal wt bear  9 secs      Max pain LT great toe 9/10 3/10  3/10 most of the time but occassionally 9/10 as in today   1-2/10 in ankle and toe      6MWT   824 ft 1290-1755 ft NT   966 ft       sustained bridge  32 sec    Cramping in hamstring NT          SLS RT 23/LT 10 sec  RT 23 LT 7 sec  RT < 3  LT < 3 checked with and without shoes        DF LT  DF 0 LT DF- 0 LT         GAIT: Distance walked: 150 Assistive device utilized: None Level of assistance: Complete Independence Comments: antalgic gait left, with trunk lean to RT Pt with hammer toes and ulcer on LT great toe healing , with an extension pad.  Pt is unable to depress 1st ray with gait in stance/ push off phase, Pt tends to toe grip with shoes and is wearing more sneakers rather than sandals presently.    TODAY'S TREATMENT:  [facility] Adult PT Treatment:                                                DATE: 11-24-22 Therapeutic Exercise: Step over yoga block LT to RT and then RT to LT 3 x 10 Goblet squat to chair with pause 2 x 10  25 # Reverse Clam LT sidelying 1 x 10 PTT with GTB sitting with figure 4 2 x 10 Eversion with GTB  2 x 10  Neuromuscular re-ed: Gait training with mirror with verbal and visual cues for proper form/ technique  performing multiple times to promote intrinsic feedback and practice walking inside on level surface.  In front of mobile mirror  [facility] Adult PT Treatment:                                                 DATE: 11-12-22 Therapeutic Exercise: Woodpecker anterior  LT LE SL3 x 10 Woodpecker lateral LT LE SL 3 x 10 Woodpecker lateral LT LE SL 3 x 10  Neuromuscular re-ed: Gait training in // with arch tape and verbal and visual cues for proper form/ technique performing multiple times to promote intrinsic feedback and practice walking outside of the //.  In front of mobile mirror Manual Therapy: Arch taping on the L to promote ankle support    [facility] Adult PT Treatment:                                                DATE: 11/05/22 Therapeutic Exercise: Standing arch building exercise in //  Neuromuscular re-ed: Gait training in // with arch tape and verbal and visual cues for proper form/ technique performing multiple times to promote intrinsic feedback and practice walking outside of the //.  Manual Therapy: Arch taping on the L to promote ankle support  Therapeutic Activity: 6 min walk increased to 966 ft 5 x STS 9 seconds   [facility] Adult PT Treatment:                                                DATE: 11/02/2021 Therapeutic Exercise: Revers clam shell 2 x 10 Clamshell 2 x 15 Manual Therapy: MTRP along the R glute med  Tack and stretch of the R glute med LAD RLE grade IV with oscillations LLE talocrural LAD grade III Therapeutic Activity: Reviewed MMT results and impact on ADLs and benefit of strengthening. Additionally utilizing 1 rest day a week to allow for muscle recovery and continue to further strength.   [facility] Adult PT Treatment:                                                DATE: 10/29/2021 Therapeutic Exercise: Seated hip ER with RTB 3 x 10 Ankle posterior tib strengthening 3 x10  Manual Therapy: IASTM along the gastroc/ soleus and peroneals Sub-talar mobs grade III in prone for supination/ pronation, talocural LAD grad III Edema reduction massage in prone  Trigger Point Dry-Needling  Treatment instructions: Expect mild to moderate muscle  soreness. S/S of pneumothorax if dry needled over a lung field, and to seek immediate medical attention should they occur. Patient verbalized understanding of these instructions and education.  Patient Consent Given: Yes Education handout provided: Previously provided Muscles treated: peroneal longus/ brevis, gastro Electrical stimulation performed: No Parameters: N/A Treatment response/outcome: twitch response, and muscle lengthening.   Neuromuscular re-ed: Gait training with trekking poles 2 x 180 ft, 2nd set focused on redirection of pt's attention which eased her apprehension of falling.  PATIENT EDUCATION: updated 10/29/2022 Education details: for Edema reduction her husband could use lotion and perform the technique on her ankle while she is in prone. Discussed compression stocking to get something taut but comfortable(otherwise too tight may be counter productive) and could trial using ace wraps to see how she does with it before purchasing compression stockings.  Person educated: Patient Education method: Explanation Education comprehension: verbalized understanding and needs further education   HOME EXERCISE PROGRAM:  Access Code: [email or web address] URL: [email or web address] Date: 11/24/2022 Prepared by: [name]  Program Notes Toe Yoga as shown in clinicDorsiflexion stretch on footstool with power band as shown in clinic.  Buy on [employer] with various strengths  Exercises - Seated Toe Raise  - 1 x daily - 7 x weekly - 2 sets - 10 reps - Seated Calf Stretch with Strap  - 1 x daily - 7 x weekly - 2 sets - 3 reps - 30 hold - Standing 3-Way Leg Reach with Resistance at Ankles and Counter Support  - 1 x daily - 7 x weekly - 3 sets - 10 reps - Sit to stand with sink support Movement snack  - 1 x daily - 7 x weekly - 3 sets - 10 reps - Single Leg Stance with Support  - 2 x daily - 7 x weekly - 1 sets - 3-5 reps - hold 15 sec hold - Heel raise with counter  support and towel under toes  - 1 x daily - 7 x weekly - 3 sets - 10 reps - Seated Figure 4 Ankle Inversion with Resistance  - 1 x daily - 7 x weekly - 3 sets - 10 reps - Seated Ankle Eversion with Resistance  - 1 x daily - 7 x weekly - 3 sets - 10 reps - Hip Hiking on Step  - 1 x daily - 7 x weekly - 3 sets - 10 reps - Sidelying Hip Abduction  - 1 x daily - 7 x weekly - 3 sets - 12 reps - Woodpecker one leg anterior, lateral and medial  - 1 x daily - 7 x weekly - 6 sets - 10 reps - Sidelying Reverse Clamshell with Resistance  - 1 x daily - 7 x weekly - 3 sets - 10 reps - Clamshell with Resistance  - 1 x daily - 7 x weekly - 3 sets - 10 reps - Goblet Squat with Kettlebell  - 1 x daily - 7 x weekly - 3 sets - 10 reps ASSESSMENT:   CLINICAL IMPRESSION: Ms Vanosdol arrives to clinic while husband is received post TKR rehab. Pt working on other side of room to concentrate on her own care.   She is a 1-2/10 pain and she is now walking with decreased Tibial ER on LT.   PT emphasized that she will need to work on her Gluteal muscles with exercises due to her trendelenberg gait. Pt is also going to exercise physiologist.  She may benefit from professional orthotics but may also want to have ankle evaluated by [facility].   Pt was told she needed to also prioritize her own self care and find respite so she would not worsen her own symptoms.  Exercises added to HEP to help with intrinsic foot muscle strength this visit.    OBJECTIVE IMPAIRMENTS: Abnormal gait, decreased balance, difficulty walking, decreased ROM, decreased strength, hypomobility, obesity, and pain.    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    REHAB POTENTIAL: Good   CLINICAL DECISION MAKING: Evolving/moderate complexity   EVALUATION COMPLEXITY: Moderate     GOALS: Goals reviewed with patient? No   SHORT TERM GOALS: Target date: 10-29-21 Pt will be independent with intial HEP Baseline:no knowledge,  10-07-22 initial HEP given  Goal status: MET   2.  Pt will be able perform STS with even wt distribution in bil LE Baseline: Pt wt bears to RT in STS Goal status: MET   3.  Pt pain with functional activities in Great toe will be reduced to 5/10  Baseline: 9/10 10-15-22  3/10 Goal status: MET     LONG TERM GOALS: Target date: 11-26-22   Pt will be independent with advanced HEP.  Baseline:  Goal status: ONGOING 11/02/2022   2.  FOTO will improve from  51%  to   64%  indicating improved functional mobility.  Baseline: eval 51% 10-20-22 73% Goal status: MET   3.  Pain will decrease to 3/10 with all functional activities from 9/10 Baseline: Pt max pain after functional activities. 9/10 Status: continues to have pain Goal status: ONGOING 11/02/2022   4.  Pt will be able to perform 6MWT within normal range for age Baseline: 824 ft 1290-1755 ft Goal status:ONGOING 11/02/2022   5.  Pt will be able to perform balance with SLS for at least 30 sec on RT and LT to show improved balance/strength in SL activities Baseline: RT 23 sec  LT 10 sec tries to use UE 10-20-22 < 3 sec BIL today Status:  Goal status: ONGOING 11/02/2022   6.  Pt will be able to perform floor to stand transfer to show  increased strength and safe fall preparedness  Baseline: unable to perform Status: unable to perform  Goal status: ONGOING 11/02/2022    7. Demonstrate 4 + /5 LT LE strength to improve stability, safety and endurance of community level function            Baseline: 4-/5 10-20-22  See flow chart            Goal status: ONGOING 11/02/2022       PLAN:   PT FREQUENCY: 1-2x/week    PT DURATION: 8 weeks   PLANNED INTERVENTIONS: Therapeutic exercises, Therapeutic activity, Neuromuscular re-education, Balance training, Gait training, Patient/Family education, Self Care, Joint mobilization, Stair training, Dry Needling, Electrical stimulation, Cryotherapy, Moist heat, Taping, Ionotophoresis '4mg'$ /ml Dexamethasone, Manual therapy, and Re-evaluation   PLAN FOR NEXT SESSION: TPDN, Manual, Response to edema reduction.  Gait training, how was arch taping. R glute med strengthening.   May add prone quad stretch to [place], PT, [facility] Certified Exercise Expert for the Aging Adult  11/24/22 12:37 PM Phone: [phone] Fax: [phone]

## 2022-11-24 ENCOUNTER — Encounter: Payer: Self-pay | Admitting: Physical Therapy

## 2022-11-24 ENCOUNTER — Other Ambulatory Visit: Payer: Self-pay

## 2022-11-24 ENCOUNTER — Ambulatory Visit: Payer: Medicare Other | Admitting: Physical Therapy

## 2022-11-24 DIAGNOSIS — R262 Difficulty in walking, not elsewhere classified: Secondary | ICD-10-CM | POA: Diagnosis not present

## 2022-11-24 DIAGNOSIS — I4819 Other persistent atrial fibrillation: Secondary | ICD-10-CM

## 2022-11-24 DIAGNOSIS — M25672 Stiffness of left ankle, not elsewhere classified: Secondary | ICD-10-CM

## 2022-11-24 DIAGNOSIS — M79675 Pain in left toe(s): Secondary | ICD-10-CM

## 2022-11-24 MED ORDER — RIVAROXABAN 20 MG PO TABS: 20.0000 mg | ORAL_TABLET | Freq: Every day | ORAL | 1 refills | Status: DC

## 2022-11-24 NOTE — Telephone Encounter (Signed)
Prescription refill request for Xarelto received.  Indication: Afib  Last office visit: 09/02/22 Johney Frame)  Weight: 104.7kg Age: 77 Scr: 0.98 (02/19/22)  CrCl: 94.53m/min  Appropriate dose. Refill sent.

## 2022-11-30 ENCOUNTER — Ambulatory Visit (INDEPENDENT_AMBULATORY_CARE_PROVIDER_SITE_OTHER): Payer: Medicare Other | Admitting: Podiatry

## 2022-11-30 ENCOUNTER — Encounter: Payer: Self-pay | Admitting: Podiatry

## 2022-11-30 DIAGNOSIS — L97522 Non-pressure chronic ulcer of other part of left foot with fat layer exposed: Secondary | ICD-10-CM

## 2022-11-30 DIAGNOSIS — G629 Polyneuropathy, unspecified: Secondary | ICD-10-CM

## 2022-11-30 NOTE — Progress Notes (Signed)
  Subjective:  Patient ID: Elizabeth Wang, female    DOB: 04-28-1946,   MRN: [id number]  Chief Complaint  Patient presents with   Wound Check    Patient states there has been pain since the last visit , nothing constant. Patient states most of the pain is in the great toe joint itself     77 y.o. female presents for follow-up of left hallux wound. Relates it was doing better has been dressing it and has been in PT to help her gait and prevent rubbing in this area on her toe. .    Denies any other pedal complaints. Denies n/v/f/c.   Past Medical History:  Diagnosis Date   Acute pulmonary embolism ([place]) 06/2013   Arthritis    Asthma    years ago    Clotting disorder (HCC)    DVT/PE   Diverticulosis    minor    Dyslipidemia    Esophageal reflux    Hypertension    Hypothyroidism    Numbness    TOES   Obesity    OSA (obstructive sleep apnea)    severe OSA with an AHI of 51/hr and nocturnal hypoxemia with O2 sats at low as 69%.   Paroxysmal atrial fibrillation (HCC)    1 isolated episode in the setting of acute stress with no further episodes.    Pseudogout     Objective:  Physical Exam: Vascular: DP/PT pulses 2/4 bilateral. CFT <3 seconds. Normal hair growth on digits. No edema.  Skin. No lacerations or abrasions bilateral feet. Hyperkeratotic lesion noted to lateral third digit around nail plate and some incurvation of right third digit nail. Left hallux ulcer reopened measures 0.4 cm x 0.2 cm x 0.2 cm   No erythema edema or purulence noted.  Musculoskeletal: MMT 5/5 bilateral lower extremities in DF, PF, Inversion and Eversion. Deceased ROM in DF of ankle joint. Hallux malleus noted.  Neurological: Sensation intact to light touch.   Assessment:   1. Ulcer of great toe, left, with fat layer exposed ([place])   2. Neuropathy         Plan:  Patient was evaluated and treated and all questions answered. Ulcer left hallux wound with fat layer exposed.   -Ulcer debrided procedure  below.   -Dressed with offloading pads.  -Off-loading with aperture pads. -Discussed avoiding any pedicures and staying off of this foot as much as possible.  -Discussed glucose control and proper protein-rich diet.  -Discussed if any worsening redness, pain, fever or chills to call or may need to report to the emergency room. Patient expressed understanding.  -Discussed possible surgery to straighten hallux hammertoe to prevent future ulceration. Did discuss she is in PT now to work on ankle ROM and gait and we will see if this improves the ulceration.   Procedure: Excisional Debridement of Wound Rationale: Removal of non-viable soft tissue from the wound to promote healing.  Anesthesia: none Pre-Debridement Wound Measurements: Overlying callus  Post-Debridement Wound Measurements: 0.4 cm x 0.2 cm x 0.2 cm  Type of Debridement: Sharp Excisional Tissue Removed: Non-viable soft tissue Depth of Debridement: subcutaneous tissue. Technique: Sharp excisional debridement to bleeding, viable wound base.  Dressing: Dry, sterile, compression dressing. Disposition: Patient tolerated procedure well. Patient to return in 2 week for follow-up.  No follow-ups on file.   Follow-up in 4 weeks with Dr. Amalia Hailey to recheck wound healed.     No follow-ups on file.   Lorenda Peck, DPM

## 2022-12-01 ENCOUNTER — Ambulatory Visit: Payer: Medicare Other | Attending: Internal Medicine | Admitting: Physical Therapy

## 2022-12-01 ENCOUNTER — Encounter: Payer: Self-pay | Admitting: Physical Therapy

## 2022-12-01 DIAGNOSIS — M25672 Stiffness of left ankle, not elsewhere classified: Secondary | ICD-10-CM | POA: Diagnosis not present

## 2022-12-01 DIAGNOSIS — R262 Difficulty in walking, not elsewhere classified: Secondary | ICD-10-CM | POA: Diagnosis not present

## 2022-12-01 DIAGNOSIS — M79675 Pain in left toe(s): Secondary | ICD-10-CM | POA: Diagnosis not present

## 2022-12-01 NOTE — Therapy (Signed)
OUTPATIENT PHYSICAL THERAPY TREATMENT NOTE    Patient Name: Elizabeth Wang MRN: [id number] DOB:[birth year], 77 y.o., female Today's Date: 12/01/2022  PCP: Ginger Organ. 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   Paroxysmal atrial fibrillation ([facility])    1 isolated episode in the setting of acute stress with no further episodes.    Pseudogout    Past Surgical History:  Procedure Laterality Date   BREAST RECONSTRUCTION     CARDIOVERSION N/A 06/27/2020   Procedure: CARDIOVERSION;  Surgeon: [name], MD;  Location: [facility];  Service: Cardiovascular;  Laterality: N/A;   CESAREAN SECTION     COLONOSCOPY  2004   repeat in ten years   KNEE ARTHROSCOPY Left    TONSILLECTOMY     TOTAL KNEE ARTHROPLASTY Right 01/01/2014   Procedure: TOTAL RIGHT KNEE ARTHROPLASTY;  Surgeon: [name], MD;  Location: [facility];  Service: Orthopedics;  Laterality: Right;   TOTAL KNEE ARTHROPLASTY Left 07/23/2014   Procedure: LEFT TOTAL KNEE ARTHROPLASTY;  Surgeon: [name], MD;  Location: [facility];  Service: Orthopedics;  Laterality: Left;   tummy tuck   1987   WISDOM TOOTH EXTRACTION     Patient Active Problem List   Diagnosis Date Noted   Closed fracture of proximal phalanx of lesser toe of right foot 09/01/2022   Osteoarthritis of talonavicular joint 09/01/2022   Closed fracture of fifth metatarsal bone 09/05/2021   Contusion of left knee 04/05/2019   Pain in left knee 04/03/2019   Osteoarthritis of right glenohumeral joint 11/24/2017   Pain of left great toe 12/17/2016   Skin ulcer of left great toe, limited to breakdown of skin ([place]) 12/17/2016   Gout 01/12/2014   S/P total knee arthroplasty 01/11/2014   Hypokalemia 01/03/2014   Hyponatremia 01/03/2014   Postoperative anemia due to acute blood loss 01/02/2014   Hypothyroidism 01/02/2014   Other and unspecified hyperlipidemia 01/02/2014   OA (osteoarthritis) of knee 01/01/2014   Essential hypertension, benign 11/28/2013   Pulmonary embolism ([place]) 11/28/2013   DVT (deep venous thrombosis) ([place]) 08/09/2013   Other pulmonary embolism and infarction 08/09/2013   Persistent atrial fibrillation ([place]) 07/14/2013    REFERRING DIAG: M25.572 (ICD-10-CM) - Pain in left ankle and joints  of left foot    THERAPY DIAG:  Difficulty in walking, not elsewhere classified  Stiffness of left ankle, not elsewhere classified  Pain of left great toe  Rationale for Evaluation and Treatment Rehabilitation  PERTINENT HISTORY: 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 I had to have my husband help me with wound care on my toe because I cannot  reach it. ( Pt helped pt to be able to bend LT knee and LT ankle on RT knee to reach wound  PAIN:  Pain  Are you having pain? Yes: NPRS scale: 0/10 Pain location: great toe LT,  unable to descend 1st ray Pain description: dull ache Aggravating factors: walking on it when I walk > 7000 steps,  just living,  sometimes just throbbing, steps Relieving factors: wearing a toe pad, salonpas topical No pain in my left ankle, uncomfortable because it does not bend   OBJECTIVE: (objective measures completed at initial evaluation unless otherwise dated)   DIAGNOSTIC FINDINGS: x rays recently taken but not available at [place]:  FOTO 51%  predicted 64% 10-20-22 73% 11/02/2022 49%   COGNITION: Overall cognitive status: Within functional limits for tasks assessed                         SENSATION: WFL   EDEMA:  Figure 8: RT 54 cm, LT 61 cm 10-15-22 10:30 AM  LT 59.5 cm   MUSCLE LENGTH: Hamstrings: Right 53 deg; Left 50 deg  10-15-22 LLD 31 in ASIS to malleolus LT  RT 31.75 inch     POSTURE: rounded shoulders, forward head, flexed trunk , and obesity   PALPATION: Not pain but increased stiffness / blocked talar glide   LOWER EXTREMITY ROM:   Active ROM Right eval Left eval LT 10-15-22 10-20-22 LT 12-01-22  Hip flexion         Hip extension         Hip abduction         Hip adduction         Hip internal rotation         Hip external rotation  Knee flexion         Knee extension         Ankle dorsiflexion 5 +9 from neutral 0 0 0  Ankle plantarflexion 65 51  55 60  Ankle inversion '27 14  25 25  '$ Ankle eversion '20 14  15 29  '$ Great toe distal flexion 50 25  39 65  Great toe  extension 45 '10  10 10   '$ (Blank rows = not tested)   LOWER EXTREMITY MMT:   MMT Right eval Left eval RT/LT 10-20-22 RT/ LT 11/02/2022  Hip flexion 4+ 4 4+/4 4+/4+  Hip extension 4 4- 4/4 4/4  Hip abduction 4- 4- 4-/4- 4-/4-  Hip adduction        Hip internal rotation         Hip external rotation        Knee flexion        Knee extension 5 4 5/4+   Ankle dorsiflexion 5 4 5/4- 5/4+  Ankle plantarflexion        Ankle inversion 4+ 4- 4+/4 5/ 4+  Ankle eversion 4+ 4- 4+/4 5/4+   (Blank rows = not tested)       FUNCTIONAL TESTS:  5 times sit to stand: 11.15 6 MWT 12-21-23824 ft 1290-1755 ft            Sustained bridge  32 seconds             SLS RT 23 sec  LT 10sec but pain   [facility]isk Signs Eval (12-7- 2023) 10-15-22  10-20-22  11/05/2022  11-24-22     5 x  STS 11.73 sec but wt bears to RT 7.66 sec wt bears to RT   9.66 sec with equal wt bear  9 secs      Max pain LT great toe 9/10 3/10  3/10 most of the time but occassionally 9/10 as in today   1-2/10 in ankle and toe  65 PIP GREAT toe LT    6MWT   824 ft 1290-1755 ft NT   966 ft       sustained bridge  32 sec    Cramping in hamstring NT          SLS RT 23/LT 10 sec  RT 23 LT 7 sec  RT < 3  LT < 3 checked with and without shoes        DF LT  DF 0 LT DF- 0 LT   DF 62f      GAIT: Distance walked: 150 Assistive device utilized: None Level of assistance: Complete Independence Comments: antalgic gait left, with trunk lean to RT Pt with hammer toes and ulcer on LT great toe healing , with an extension pad.  Pt is unable to depress 1st ray with gait in stance/ push off phase, Pt tends to toe grip with shoes and is wearing more sneakers rather than sandals presently.    TODAY'S TREATMENT:  [facility] Adult PT Treatment:                                                DATE: 12-01-22 ROM measurements Therapeutic Exercise: Step over yoga block LT to RT and then RT to LT 3 x 10 with 2 Lb weight on LT Modified mountain climber with UE on elevated mat 2 x 10 Goblet squat  to chair with pause 2 x 10 30 # Lunge 1/2 way with UE support 10 x on RT and LT each Heel raise BIL SL heel raise 1 x 10 on LT 1/2 range Eversion with GTB  2 x 10 Woodpecker anterior  LT LE SL3 x 10 Manual Therapy: LT great toe distraction and  Toe flex/ext Put heel lift in shoe Therapeutic Activity:  Pt attempted to get to ground but limited by limited flexibility in knees etc.  Began lunges today and will continue to progress as pt is able for fall preparedness.   [facility] Adult PT Treatment:                                                DATE: 11-24-22 Therapeutic Exercise: Step over yoga block LT to RT and then RT to LT 3 x 10 Goblet squat to chair with pause 2 x 10  25 # Reverse Clam LT sidelying 1 x 10 PTT with GTB sitting with figure 4 2 x 10 Eversion with GTB  2 x 10 Neuromuscular re-ed: Gait training with mirror with verbal and visual cues for proper form/ technique performing multiple times to promote intrinsic feedback and practice walking inside on level surface.  In front of mobile mirror  [facility] Adult PT Treatment:                                                DATE: 11-12-22 Therapeutic Exercise: Woodpecker anterior  LT LE SL3 x 10 Woodpecker lateral LT LE SL 3 x 10 Woodpecker lateral LT LE SL 3 x 10  Neuromuscular re-ed: Gait training in // with arch tape and verbal and visual cues for proper form/ technique performing multiple times to promote intrinsic feedback and practice walking outside of the //.  In front of mobile mirror Manual Therapy: Arch taping on the L to promote ankle support    [facility] Adult PT Treatment:                                                DATE: 11/05/22 Therapeutic Exercise: Standing arch building exercise in //  Neuromuscular re-ed: Gait training in // with arch tape and verbal and visual cues for proper form/ technique performing multiple times to promote intrinsic feedback and practice walking outside of the //.  Manual Therapy: Arch taping on the L to promote ankle support  Therapeutic Activity: 6 min walk increased to 966 ft 5 x STS 9 seconds   [facility] Adult PT Treatment:                                                DATE: 11/02/2021 Therapeutic Exercise: Revers clam shell 2 x  10 Clamshell 2 x 15 Manual Therapy: MTRP along the R glute med  Tack and stretch of the R glute med LAD RLE grade IV with oscillations LLE talocrural LAD grade III Therapeutic Activity: Reviewed MMT results and impact on ADLs  and benefit of strengthening. Additionally utilizing 1 rest day a week to allow for muscle recovery and continue to further strength.   [facility] Adult PT Treatment:                                                DATE: 10/29/2021 Therapeutic Exercise: Seated hip ER with RTB 3 x 10 Ankle posterior tib strengthening 3 x10  Manual Therapy: IASTM along the gastroc/ soleus and peroneals Sub-talar mobs grade III in prone for supination/ pronation, talocural LAD grad III Edema reduction massage in prone  Trigger Point Dry-Needling  Treatment instructions: Expect mild to moderate muscle soreness. S/S of pneumothorax if dry needled over a lung field, and to seek immediate medical attention should they occur. Patient verbalized understanding of these instructions and education.  Patient Consent Given: Yes Education handout provided: Previously provided Muscles treated: peroneal longus/ brevis, gastro Electrical stimulation performed: No Parameters: N/A Treatment response/outcome: twitch response, and muscle lengthening.   Neuromuscular re-ed: Gait training with trekking poles 2 x 180 ft, 2nd set focused on redirection of pt's attention which eased her apprehension of falling.     PATIENT EDUCATION: updated 10/29/2022 Education details: for Edema reduction her husband could use lotion and perform the technique on her ankle while she is in prone. Discussed compression stocking to get something taut but comfortable(otherwise too tight may be counter productive) and could trial using ace wraps to see how she does with it before purchasing compression stockings.  Person educated: Patient Education method: Explanation Education comprehension: verbalized understanding and needs  further education   HOME EXERCISE PROGRAM:  Access Code: [email or web address] URL: [email or web address] Date: 12/01/2022 Prepared by: [name]  Program Notes Toe Yoga as shown in clinicDorsiflexion stretch on footstool with power band as shown in clinic.  Buy on [employer] with various strengths  Exercises - Seated Toe Raise  - 1 x daily - 7 x weekly - 2 sets - 10 reps - Seated Calf Stretch with Strap  - 1 x daily - 7 x weekly - 2 sets - 3 reps - 30 hold - Standing 3-Way Leg Reach with Resistance at Ankles and Counter Support  - 1 x daily - 7 x weekly - 3 sets - 10 reps - Sit to stand with sink support Movement snack  - 1 x daily - 7 x weekly - 3 sets - 10 reps - Single Leg Stance with Support  - 2 x daily - 7 x weekly - 1 sets - 3-5 reps - hold 15 sec hold - Heel raise with counter support and towel under toes  - 1 x daily - 7 x weekly - 3 sets - 10 reps - Seated Figure 4 Ankle Inversion with Resistance  - 1 x daily - 7 x weekly - 3 sets - 10 reps - Seated Ankle Eversion with Resistance  - 1 x daily - 7 x weekly - 3 sets - 10 reps - Hip Hiking on Step  - 1 x daily - 7 x weekly - 3 sets - 10 reps - Sidelying Hip Abduction  - 1 x daily - 7 x weekly - 3 sets - 12 reps - Woodpecker one leg anterior, lateral and medial  - 1 x daily - 7 x weekly - 6 sets - 10 reps - Sidelying Reverse Clamshell with Resistance  - 1  x daily - 7 x weekly - 3 sets - 10 reps - Clamshell with Resistance  - 1 x daily - 7 x weekly - 3 sets - 10 reps - Goblet Squat with Kettlebell  - 1 x daily - 7 x weekly - 3 sets - 10 reps - Lunge with Counter Support  - 1 x daily - 7 x weekly - 3 sets - 10 reps - [employer] on Counter  - 1 x daily - 7 x weekly - 3 sets - 10 reps - Single Leg Heel Raise with Counter Support  - 1 x daily - 7 x weekly - 3 sets - 10 reps ASSESSMENT:   CLINICAL IMPRESSION: Ms Lonzo arrives to clinic alone today and husband at home.  Discussed prioritizing her care.  Today  AROM measurements taken for LT ankle with improvement in mobility except Dorsiflexion 0 and with bony block stable but not moving.  Pt reports that podiatrist is pleased with her improved mobility in ankle.  Pt tried to work on floor to stand transfers but is still limited by stiffness and strength deficit.  Will continue to work toward goals. HEP updated.     OBJECTIVE IMPAIRMENTS: Abnormal gait, decreased balance, difficulty walking, decreased ROM, decreased strength, hypomobility, obesity, and pain.    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    REHAB POTENTIAL: Good   CLINICAL DECISION MAKING: Evolving/moderate complexity   EVALUATION COMPLEXITY: Moderate     GOALS: Goals reviewed with patient? No   SHORT TERM GOALS: Target date: 10-29-21 Pt will be independent with intial HEP Baseline:no knowledge,  10-07-22 initial HEP given  Goal status: MET   2.  Pt will be able perform STS with even wt distribution in bil LE Baseline: Pt wt bears to RT in STS Goal status: MET   3.  Pt pain with functional activities in Great toe will be reduced to 5/10  Baseline: 9/10 10-15-22  3/10 Goal status: MET     LONG TERM GOALS: Target date: 11-26-22   Pt will be independent with advanced HEP.  Baseline:  Goal status: ONGOING 11/02/2022   2.  FOTO will improve from  51%  to   64%  indicating improved functional mobility.  Baseline: eval 51% 10-20-22 73% Goal status: MET   3.  Pain will decrease to 3/10 with all functional activities from 9/10 Baseline: Pt max pain after functional activities. 9/10 Status: continues to have pain Goal  status: ONGOING 11/02/2022   4.  Pt will be able to perform 6MWT within normal range for age Baseline: 824 ft 1290-1755 ft Goal status:ONGOING 11/02/2022   5.  Pt will be able to perform balance with SLS for at least 30 sec on RT and LT to show improved balance/strength in SL activities Baseline: RT 23 sec  LT 10 sec tries to use UE 10-20-22 < 3 sec BIL today Status:  Goal status: ONGOING 11/02/2022   6.  Pt will be able to perform floor to stand transfer to show  increased strength and safe fall preparedness  Baseline: unable to perform Status: unable to perform  Goal status: ONGOING 11/02/2022    7. Demonstrate 4 + /5 LT LE strength to improve stability, safety  and endurance of community level function            Baseline: 4-/5 10-20-22  See flow chart            Goal status: ONGOING 11/02/2022       PLAN:   PT FREQUENCY: 1-2x/week   PT DURATION: 8 weeks   PLANNED INTERVENTIONS: Therapeutic exercises, Therapeutic activity, Neuromuscular re-education, Balance training, Gait training, Patient/Family education, Self Care, Joint mobilization, Stair training, Dry Needling, Electrical stimulation, Cryotherapy, Moist heat, Taping, Ionotophoresis '4mg'$ /ml Dexamethasone, Manual therapy, and Re-evaluation   PLAN FOR NEXT SESSION: TPDN, Manual, Response to edema reduction.  Gait training, how was arch taping. R glute med strengthening.   May add prone quad stretch to [place], PT, [facility] Certified Exercise Expert for the Aging Adult  12/01/22 3:40 PM Phone: [phone] Fax: [phone]

## 2022-12-08 ENCOUNTER — Ambulatory Visit: Payer: Medicare Other | Admitting: Physical Therapy

## 2022-12-08 NOTE — Therapy (Incomplete)
OUTPATIENT PHYSICAL THERAPY TREATMENT NOTE    Patient Name: Elizabeth Wang MRN: [id number] DOB:10-Nov-1945, 77 y.o., female Today's Date: 12/08/2022  PCP: Ginger Organ. MD  REFERRING PROVIDER: Armond Hang, MD    END OF SESSION:                Past Medical History:  Diagnosis Date   Acute pulmonary embolism ([place]) 06/2013   Arthritis    Asthma    years ago    Clotting disorder (HCC)    DVT/PE   Diverticulosis    minor    Dyslipidemia    Esophageal reflux    Hypertension    Hypothyroidism    Numbness    TOES   Obesity    OSA (obstructive sleep apnea)    severe OSA with an AHI of 51/hr and nocturnal hypoxemia with O2 sats at low as 69%.   Paroxysmal atrial fibrillation (HCC)    1 isolated episode in the setting of acute stress with no further episodes.    Pseudogout    Past Surgical History:  Procedure Laterality Date   BREAST RECONSTRUCTION     CARDIOVERSION N/A 06/27/2020   Procedure: CARDIOVERSION;  Surgeon: [name], MD;  Location: [facility];  Service: Cardiovascular;  Laterality: N/A;   CESAREAN SECTION     COLONOSCOPY  2004   repeat in ten years   KNEE ARTHROSCOPY Left    TONSILLECTOMY     TOTAL KNEE ARTHROPLASTY Right 01/01/2014   Procedure: TOTAL RIGHT KNEE ARTHROPLASTY;  Surgeon: [name], MD;  Location: [facility];  Service: Orthopedics;  Laterality: Right;   TOTAL KNEE ARTHROPLASTY Left 07/23/2014   Procedure: LEFT TOTAL KNEE ARTHROPLASTY;  Surgeon: [name], MD;  Location: [facility];  Service: Orthopedics;  Laterality: Left;   tummy tuck  1987   WISDOM TOOTH EXTRACTION     Patient Active Problem List   Diagnosis Date Noted   Closed fracture of proximal phalanx of lesser toe of right foot 09/01/2022   Osteoarthritis of talonavicular joint 09/01/2022   Closed fracture of fifth metatarsal bone 09/05/2021   Contusion of left knee 04/05/2019   Pain in left knee 04/03/2019   Osteoarthritis of right glenohumeral joint  11/24/2017   Pain of left great toe 12/17/2016   Skin ulcer of left great toe, limited to breakdown of skin ([place]) 12/17/2016   Gout 01/12/2014   S/P total knee arthroplasty 01/11/2014   Hypokalemia 01/03/2014   Hyponatremia 01/03/2014   Postoperative anemia due to acute blood loss 01/02/2014   Hypothyroidism 01/02/2014   Other and unspecified hyperlipidemia 01/02/2014   OA (osteoarthritis) of knee 01/01/2014   Essential hypertension, benign 11/28/2013   Pulmonary embolism ([place]) 11/28/2013   DVT (deep venous thrombosis) ([place]) 08/09/2013   Other pulmonary embolism and infarction 08/09/2013   Persistent atrial fibrillation ([place]) 07/14/2013    REFERRING DIAG: M25.572 (ICD-10-CM) - Pain in left ankle and joints of left foot    THERAPY DIAG:  No diagnosis found.  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 I had to have my husband help me with wound care on my toe because I cannot reach it. ( Pt helped pt to be able to bend LT knee and LT ankle on RT knee to reach wound  PAIN:  Pain  Are you having pain? Yes: NPRS scale: 0/10 Pain location: great toe LT,  unable to descend 1st ray Pain description: dull ache Aggravating factors: walking on it when I walk > 7000 steps,  just living,  sometimes just throbbing, steps Relieving factors: wearing a toe pad, salonpas topical No pain in my left ankle, uncomfortable because it does not bend   OBJECTIVE: (objective  measures completed at initial evaluation unless otherwise dated)   DIAGNOSTIC FINDINGS: x rays recently taken but not available at [facility]:  FOTO 51%  predicted 64% 10-20-22 73% 11/02/2022 49%   COGNITION: Overall cognitive status: Within functional limits for tasks assessed                         SENSATION: WFL   EDEMA:  Figure 8: RT 54 cm, LT 61 cm 10-15-22 10:30 AM  LT 59.5 cm   MUSCLE LENGTH: Hamstrings: Right 53 deg; Left 50 deg  10-15-22 LLD 31 in ASIS to malleolus LT  RT 31.75 inch     POSTURE: rounded shoulders, forward head, flexed trunk , and obesity   PALPATION: Not pain but increased stiffness / blocked talar glide   LOWER EXTREMITY ROM:   Active ROM Right eval Left eval LT 10-15-22 10-20-22 LT 12-01-22  Hip flexion         Hip extension         Hip abduction         Hip adduction         Hip internal rotation         Hip external rotation         Knee flexion         Knee extension         Ankle dorsiflexion 5 +9 from neutral 0 0 0  Ankle plantarflexion 65 51  55 60  Ankle inversion 27 14  25 25  $ Ankle eversion 20 14  15 29  $ Great toe distal flexion 50 25  39 65  Great toe  extension 45 10  10 10   $ (Blank rows = not tested)   LOWER EXTREMITY MMT:   MMT Right eval Left eval RT/LT 10-20-22 RT/ LT 11/02/2022  Hip flexion 4+ 4 4+/4 4+/4+  Hip extension 4 4- 4/4 4/4  Hip abduction 4- 4- 4-/4- 4-/4-  Hip adduction        Hip internal rotation        Hip external rotation        Knee flexion        Knee extension 5 4 5/4+   Ankle dorsiflexion 5 4 5/4- 5/4+  Ankle plantarflexion        Ankle inversion 4+ 4- 4+/4 5/ 4+  Ankle eversion 4+ 4- 4+/4 5/4+   (Blank rows = not tested)       FUNCTIONAL TESTS:  5 times sit to stand: 11.15 6 MWT 12-21-23824 ft 1290-1755 ft            Sustained bridge  32 seconds  SLS RT 23 sec  LT 10sec but pain   Asterisk Signs Eval (12-7- 2023) 10-15-22  10-20-22  11/05/2022   11-24-22     5 x  STS 11.73 sec but wt bears to RT 7.66 sec wt bears to RT   9.66 sec with equal wt bear  9 secs      Max pain LT great toe 9/10 3/10  3/10 most of the time but occassionally 9/10 as in today   1-2/10 in ankle and toe  65 PIP GREAT toe LT    6MWT   824 ft 1290-1755 ft NT   966 ft       sustained bridge  32 sec    Cramping in hamstring NT          SLS RT 23/LT 10 sec  RT 23 LT 7 sec  RT < 3  LT < 3 checked with and without shoes        DF LT  DF 0 LT DF- 0 LT   DF 8f      GAIT: Distance walked: 150 Assistive device utilized: None Level of assistance: Complete Independence Comments: antalgic gait left, with trunk lean to RT Pt with hammer toes and ulcer on LT great toe healing , with an extension pad.  Pt is unable to depress 1st ray with gait in stance/ push off phase, Pt tends to toe grip with shoes and is wearing more sneakers rather than sandals presently.    TODAY'S TREATMENT:  [facility] Adult PT Treatment:                                                DATE: 12-08-22 Therapeutic Exercise: *** Manual Therapy: *** Neuromuscular re-ed: *** Therapeutic Activity: Floor transfer Modalities: *** Self Care: ***  [facility] Adult PT Treatment:                                                DATE: 12-01-22 ROM measurements Therapeutic Exercise: Step over yoga block LT to RT and then RT to LT 3 x 10 with 2 Lb weight on LT Modified mountain climber with UE on elevated mat 2 x 10 Goblet squat to chair with pause 2 x 10 30 # Lunge 1/2 way with UE support 10 x on RT and LT each Heel raise BIL SL heel raise 1 x 10 on LT 1/2 range Eversion with GTB  2 x 10 Woodpecker anterior  LT LE SL3 x 10 Manual Therapy: LT great toe distraction and Toe flex/ext Put heel lift in shoe Therapeutic Activity:  Pt attempted to get to ground but limited by limited flexibility in knees etc.  Began lunges today and will continue to progress as pt is able for fall preparedness.   [facility]                                                DATE: 11[facility]24-22 Therapeutic Exercise: Step over yoga block LT to RT and then RT to LT 3 x 10 Goblet squat to chair with pause 2 x  10  25 # Reverse Clam LT sidelying 1 x 10 PTT with GTB sitting with figure 4 2 x 10 Eversion with GTB  2 x 10 Neuromuscular re-ed: Gait training with mirror with verbal and visual cues for proper form/ technique performing multiple times to promote intrinsic feedback and practice walking inside on level surface.  In front of mobile mirror  [facility] Adult PT Treatment:                                                DATE: 11-12-22 Therapeutic Exercise: Woodpecker anterior  LT LE SL3 x 10 Woodpecker lateral LT LE SL 3 x 10 Woodpecker lateral LT LE SL 3 x 10  Neuromuscular re-ed: Gait training in // with arch tape and verbal and visual cues for proper form/ technique performing multiple times to promote intrinsic feedback and practice walking outside of the //.  In front of mobile mirror Manual Therapy: Arch taping on the L to promote ankle support    [facility] Adult PT Treatment:                                                DATE: 11/05/22 Therapeutic Exercise: Standing arch building exercise in //  Neuromuscular re-ed: Gait training in // with arch tape and verbal and visual cues for proper form/ technique performing multiple times to promote intrinsic feedback and practice walking outside of the //.  Manual Therapy: Arch taping on the L to promote ankle support  Therapeutic Activity: 6 min walk increased to 966 ft 5 x STS 9 seconds   [facility] Adult PT Treatment:                                                DATE: 11/02/2021 Therapeutic Exercise: Revers clam shell 2 x 10 Clamshell 2 x 15 Manual Therapy: MTRP along the R glute med  Tack and stretch of the R glute med LAD RLE grade IV with oscillations LLE talocrural LAD grade III Therapeutic Activity: Reviewed MMT results and impact on ADLs and benefit of  strengthening. Additionally utilizing 1 rest day a week to allow for muscle recovery and continue to further strength.   [facility] Adult PT Treatment:                                                DATE: 10/29/2021 Therapeutic Exercise: Seated hip ER with RTB 3 x 10 Ankle posterior tib strengthening 3 x10  Manual Therapy: IASTM along the gastroc/ soleus and peroneals Sub-talar mobs grade III in prone for supination/ pronation, talocural LAD grad III Edema reduction massage in prone  Trigger Point Dry-Needling  Treatment instructions: Expect mild to moderate muscle soreness. S/S of pneumothorax if dry needled over a lung field, and to seek immediate medical attention should they occur. Patient verbalized understanding of these instructions and education.  Patient Consent Given: Yes Education handout provided: Previously provided Muscles treated: peroneal longus/ brevis, gastro Electrical stimulation  performed: No Parameters: N/A Treatment response/outcome: twitch response, and muscle lengthening.   Neuromuscular re-ed: Gait training with trekking poles 2 x 180 ft, 2nd set focused on redirection of pt's attention which eased her apprehension of falling.     PATIENT EDUCATION: updated 10/29/2022 Education details: for Edema reduction her husband could use lotion and perform the technique on her ankle while she is in prone. Discussed compression stocking to get something taut but comfortable(otherwise too tight may be counter productive) and could trial using ace wraps to see how she does with it before purchasing compression stockings.  Person educated: Patient Education method: Explanation Education comprehension: verbalized understanding and needs further education   HOME EXERCISE PROGRAM:  Access Code: [email or web address] URL: [email or web address] Date: 12/01/2022 Prepared by: [name]  Program Notes Toe Yoga as shown in clinicDorsiflexion stretch on footstool with power  band as shown in clinic.  Buy on [employer] with various strengths  Exercises - Seated Toe Raise  - 1 x daily - 7 x weekly - 2 sets - 10 reps - Seated Calf Stretch with Strap  - 1 x daily - 7 x weekly - 2 sets - 3 reps - 30 hold - Standing 3-Way Leg Reach with Resistance at Ankles and Counter Support  - 1 x daily - 7 x weekly - 3 sets - 10 reps - Sit to stand with sink support Movement snack  - 1 x daily - 7 x weekly - 3 sets - 10 reps - Single Leg Stance with Support  - 2 x daily - 7 x weekly - 1 sets - 3-5 reps - hold 15 sec hold - Heel raise with counter support and towel under toes  - 1 x daily - 7 x weekly - 3 sets - 10 reps - Seated Figure 4 Ankle Inversion with Resistance  - 1 x daily - 7 x weekly - 3 sets - 10 reps - Seated Ankle Eversion with Resistance  - 1 x daily - 7 x weekly - 3 sets - 10 reps - Hip Hiking on Step  - 1 x daily - 7 x weekly - 3 sets - 10 reps - Sidelying Hip Abduction  - 1 x daily - 7 x weekly - 3 sets - 12 reps - Woodpecker one leg anterior, lateral and medial  - 1 x daily - 7 x weekly - 6 sets - 10 reps - Sidelying Reverse Clamshell with Resistance  - 1 x daily - 7 x weekly - 3 sets - 10 reps - Clamshell with Resistance  - 1 x daily - 7 x weekly - 3 sets - 10 reps - Goblet Squat with Kettlebell  - 1 x daily - 7 x weekly - 3 sets - 10 reps - Lunge with Counter Support  - 1 x daily - 7 x weekly - 3 sets - 10 reps - [employer] on Counter  - 1 x daily - 7 x weekly - 3 sets - 10 reps - Single Leg Heel Raise with Counter Support  - 1 x daily - 7 x weekly - 3 sets - 10 reps ASSESSMENT:   CLINICAL IMPRESSION: Ms Dwight arrives to clinic alone today and husband at home.  Discussed prioritizing her care.  Today AROM measurements taken for LT ankle with improvement in mobility except Dorsiflexion 0 and with bony block stable but not moving.  Pt reports that podiatrist is pleased with her improved mobility in ankle.  Pt tried to work on floor  to stand transfers  but is still limited by stiffness and strength deficit.  Will continue to work toward goals. HEP updated.     OBJECTIVE IMPAIRMENTS: Abnormal gait, decreased balance, difficulty walking, decreased ROM, decreased strength, hypomobility, obesity, and pain.    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    REHAB POTENTIAL: Good   CLINICAL DECISION MAKING: Evolving/moderate complexity   EVALUATION COMPLEXITY: Moderate     GOALS: Goals reviewed with patient? No   SHORT TERM GOALS: Target date: 10-29-21 Pt will be independent with intial HEP Baseline:no knowledge,  10-07-22 initial HEP given  Goal status: MET   2.  Pt will be able perform STS with even wt distribution in bil LE Baseline: Pt wt bears to RT in STS Goal status: MET   3.  Pt pain with functional activities in Great toe will be reduced to 5/10  Baseline: 9/10 10-15-22  3/10 Goal status: MET     LONG TERM GOALS: Target date: 11-26-22   Pt will be independent with advanced HEP.  Baseline:  Goal status: ONGOING 11/02/2022   2.  FOTO will improve from  51%  to   64%  indicating improved functional mobility.  Baseline: eval 51% 10-20-22 73% Goal status: MET   3.  Pain will decrease to 3/10 with all functional activities from 9/10 Baseline: Pt max pain after functional activities. 9/10 Status: continues to have pain Goal status: ONGOING 11/02/2022   4.  Pt will be able to perform 6MWT within normal range for age Baseline: 824 ft 1290-1755 ft Goal status:ONGOING 11/02/2022   5.  Pt will be able to perform balance with SLS for at least 30 sec on RT and LT to show improved  balance/strength in SL activities Baseline: RT 23 sec  LT 10 sec tries to use UE 10-20-22 < 3 sec BIL today Status:  Goal status: ONGOING 11/02/2022   6.  Pt will be able to perform floor to stand transfer to show  increased strength and safe fall preparedness  Baseline: unable to perform Status: unable to perform  Goal status: ONGOING 11/02/2022    7. Demonstrate 4 + /5 LT LE strength to improve stability, safety and endurance of community level function            Baseline: 4-/5 10-20-22  See flow chart            Goal status: ONGOING 11/02/2022       PLAN:   PT FREQUENCY: 1-2x/week   PT DURATION: 8 weeks   PLANNED INTERVENTIONS: Therapeutic exercises, Therapeutic activity, Neuromuscular re-education, Balance training, Gait training, Patient/Family education, Self Care, Joint mobilization, Stair training, Dry Needling, Electrical stimulation, Cryotherapy, Moist heat, Taping, Ionotophoresis 54m/ml Dexamethasone, Manual therapy, and Re-evaluation   PLAN FOR NEXT SESSION: TPDN, Manual, Response to edema reduction.  Gait training, how was arch taping. R glute med strengthening.   May add prone quad stretch to HEP    ***

## 2022-12-14 ENCOUNTER — Encounter: Payer: Self-pay | Admitting: Podiatry

## 2022-12-14 ENCOUNTER — Ambulatory Visit (INDEPENDENT_AMBULATORY_CARE_PROVIDER_SITE_OTHER): Payer: Medicare Other | Admitting: Podiatry

## 2022-12-14 DIAGNOSIS — L97522 Non-pressure chronic ulcer of other part of left foot with fat layer exposed: Secondary | ICD-10-CM | POA: Diagnosis not present

## 2022-12-14 DIAGNOSIS — G629 Polyneuropathy, unspecified: Secondary | ICD-10-CM

## 2022-12-14 NOTE — Progress Notes (Signed)
  Subjective:  Patient ID: Elizabeth Wang, female    DOB: 06-Apr-1946,   MRN: [id number]  Chief Complaint  Patient presents with   Wound Check    Left great toe ulcer , patient states husband states its getting better but there is some bloody discharge     77 y.o. female presents for follow-up of left hallux wound. Relates it was doing better has been dressing it and has been in PT to help her gait and prevent rubbing in this area on her toe. Continue PT.     Denies any other pedal complaints. Denies n/v/f/c.   Past Medical History:  Diagnosis Date   Acute pulmonary embolism ([place]) 06/2013   Arthritis    Asthma    years ago    Clotting disorder (HCC)    DVT/PE   Diverticulosis    minor    Dyslipidemia    Esophageal reflux    Hypertension    Hypothyroidism    Numbness    TOES   Obesity    OSA (obstructive sleep apnea)    severe OSA with an AHI of 51/hr and nocturnal hypoxemia with O2 sats at low as 69%.   Paroxysmal atrial fibrillation (HCC)    1 isolated episode in the setting of acute stress with no further episodes.    Pseudogout     Objective:  Physical Exam: Vascular: DP/PT pulses 2/4 bilateral. CFT <3 seconds. Normal hair growth on digits. No edema.  Skin. No lacerations or abrasions bilateral feet. Hyperkeratotic lesion noted to lateral third digit around nail plate and some incurvation of right third digit nail. Left hallux ulcer reopened measures 0.4 cm x 0.2 cm x 0.2 cm   No erythema edema or purulence noted.  Musculoskeletal: MMT 5/5 bilateral lower extremities in DF, PF, Inversion and Eversion. Deceased ROM in DF of ankle joint. Hallux malleus noted.  Neurological: Sensation intact to light touch.   Assessment:   1. Ulcer of great toe, left, with fat layer exposed ([place])   2. Neuropathy         Plan:  Patient was evaluated and treated and all questions answered. Ulcer left hallux wound with fat layer exposed.   -Ulcer debrided procedure below.   -Dressed with  offloading pads.  -Off-loading with aperture pads. -Discussed avoiding any pedicures and staying off of this foot as much as possible.  -Discussed glucose control and proper protein-rich diet.  -Discussed if any worsening redness, pain, fever or chills to call or may need to report to the emergency room. Patient expressed understanding.  -Discussed possible surgery to straighten hallux hammertoe to prevent future ulceration. Did discuss she is in PT now to work on ankle ROM and gait and we will see if this improves the ulceration.   Procedure: Excisional Debridement of Wound Rationale: Removal of non-viable soft tissue from the wound to promote healing.  Anesthesia: none Pre-Debridement Wound Measurements: Overlying callus  Post-Debridement Wound Measurements: 0.4 cm x 0.2 cm x 0.2 cm  Type of Debridement: Sharp Excisional Tissue Removed: Non-viable soft tissue Depth of Debridement: subcutaneous tissue. Technique: Sharp excisional debridement to bleeding, viable wound base.  Dressing: Dry, sterile, compression dressing. Disposition: Patient tolerated procedure well. Patient to return in 2 week for follow-up.  No follow-ups on file.       No follow-ups on file.   Lorenda Peck, DPM

## 2022-12-15 ENCOUNTER — Ambulatory Visit: Payer: Medicare Other | Admitting: Physical Therapy

## 2022-12-15 ENCOUNTER — Other Ambulatory Visit: Payer: Self-pay

## 2022-12-15 ENCOUNTER — Encounter: Payer: Self-pay | Admitting: Physical Therapy

## 2022-12-15 DIAGNOSIS — R262 Difficulty in walking, not elsewhere classified: Secondary | ICD-10-CM | POA: Diagnosis not present

## 2022-12-15 DIAGNOSIS — M79675 Pain in left toe(s): Secondary | ICD-10-CM | POA: Diagnosis not present

## 2022-12-15 DIAGNOSIS — M25672 Stiffness of left ankle, not elsewhere classified: Secondary | ICD-10-CM

## 2022-12-15 NOTE — Therapy (Signed)
OUTPATIENT PHYSICAL THERAPY TREATMENT NOTE/ERO     Patient Name: Elizabeth Wang MRN: [id number] DOB:07-02-1946, 77 y.o., female Today's Date: 12/15/2022  PCP: Ginger Organ. MD  REFERRING PROVIDER: Armond Hang, MD   END OF SESSION:   PT End of Session - 12/15/22 1113     Visit Number 15    Number of Visits 21    Date for PT Re-Evaluation 01/26/23    Authorization Type MCR/ BCBS    Progress Note Due on Visit 29    PT Start Time 1105    PT Stop Time 1135    PT Time Calculation (min) 30 min    Activity Tolerance Patient tolerated treatment well    Behavior During Therapy [facility] for tasks assessed/performed                         Past Medical History:  Diagnosis Date   Acute pulmonary embolism ([place]) 06/2013   Arthritis    Asthma    years ago    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   Paroxysmal atrial fibrillation (HCC)    1 isolated episode in the setting of acute stress with no further episodes.    Pseudogout    Past Surgical History:  Procedure Laterality Date   BREAST RECONSTRUCTION     CARDIOVERSION N/A 06/27/2020   Procedure: CARDIOVERSION;  Surgeon: [name], MD;  Location: [facility];  Service: Cardiovascular;  Laterality: N/A;   CESAREAN SECTION     COLONOSCOPY  2004   repeat in ten years   KNEE ARTHROSCOPY Left    TONSILLECTOMY     TOTAL KNEE ARTHROPLASTY Right 01/01/2014   Procedure: TOTAL RIGHT KNEE ARTHROPLASTY;  Surgeon: [name], MD;  Location: [facility];  Service: Orthopedics;  Laterality: Right;   TOTAL KNEE ARTHROPLASTY Left 07/23/2014   Procedure: LEFT TOTAL KNEE ARTHROPLASTY;  Surgeon: [name], MD;  Location: [facility];  Service: Orthopedics;  Laterality: Left;    tummy tuck  1987   WISDOM TOOTH EXTRACTION     Patient Active Problem List   Diagnosis Date Noted   Closed fracture of proximal phalanx of lesser toe of right foot 09/01/2022   Osteoarthritis of talonavicular joint 09/01/2022   Closed fracture of fifth metatarsal bone 09/05/2021   Contusion of left knee 04/05/2019   Pain in left knee 04/03/2019   Osteoarthritis of right glenohumeral joint 11/24/2017   Pain of left great toe 12/17/2016   Skin ulcer of left great toe, limited to breakdown of skin ([place]) 12/17/2016   Gout 01/12/2014   S/P total knee arthroplasty 01/11/2014   Hypokalemia 01/03/2014   Hyponatremia 01/03/2014   Postoperative anemia due to acute blood loss 01/02/2014   Hypothyroidism 01/02/2014   Other and unspecified hyperlipidemia 01/02/2014   OA (osteoarthritis) of knee 01/01/2014   Essential hypertension, benign 11/28/2013   Pulmonary embolism ([place]) 11/28/2013   DVT (deep venous thrombosis) ([place]) 08/09/2013   Other pulmonary embolism and infarction 08/09/2013   Persistent atrial fibrillation ([place]) 07/14/2013    REFERRING DIAG: M25.572 (ICD-10-CM) - Pain in left ankle and  joints of left foot    THERAPY DIAG:  Difficulty in walking, not elsewhere classified  Stiffness of left ankle, not elsewhere classified  Pain of left great toe  Rationale for Evaluation and Treatment Rehabilitation  PERTINENT HISTORY: 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  I have not been walking and I have been so busy  taking care of my husband after  his surgery that I have not been doing what I need to do to improve.  I am not sure how to really do my exercises.   PAIN:  12-15-22 4/10 LT ankle  Are you having pain? Yes: NPRS scale: 0/10 Pain location: great toe LT,  unable to descend 1st ray Pain description: dull ache Aggravating factors: walking on it when I walk > 7000 steps,  just living,  sometimes just throbbing, steps Relieving factors: wearing a toe pad, salonpas topical No pain in my left ankle, uncomfortable because it does not bend   OBJECTIVE: (objective measures completed at initial evaluation unless otherwise dated)   DIAGNOSTIC FINDINGS: x rays recently taken but not available at [place]:  FOTO 51%  predicted 64% 10-20-22 73% 11/02/2022 49% 12-15-22 65%   COGNITION: Overall cognitive status: Within functional limits for tasks assessed                         SENSATION: WFL   EDEMA:  Figure 8: RT 54 cm, LT 61 cm 10-15-22 10:30 AM  LT 59.5 cm 12-15-22  RT 54, LT  59 cm   MUSCLE LENGTH: Hamstrings: Right 53 deg; Left 50 deg  10-15-22 LLD 31 in ASIS to malleolus LT  RT 31.75 inch     POSTURE: rounded shoulders, forward head, flexed trunk , and obesity   PALPATION: Not pain but increased stiffness / blocked talar glide   LOWER EXTREMITY ROM:   Active ROM Right eval Left eval LT 10-15-22 10-20-22 LT 12-01-22  Hip flexion         Hip extension         Hip abduction         Hip  adduction         Hip internal rotation         Hip external rotation         Knee flexion         Knee extension         Ankle dorsiflexion 5 +9 from neutral 0 0 0  Ankle plantarflexion 65 51  55 60  Ankle inversion 27 14  25 25  $ Ankle eversion 20 14  15 29  $ Great toe distal flexion 50 25  39 65  Great toe  extension 45 10  10 10   $ (Blank rows = not tested)   LOWER EXTREMITY MMT:   MMT Right eval Left eval RT/LT 10-20-22 RT/ LT 11/02/2022 RT/LT 12-15-22  Hip  flexion 4+ 4 4+/4 4+/4+ 4+/4+  Hip extension 4 4- 4/4 4/4 4/4  Hip abduction 4- 4- 4-/4- 4-/4- 4-/3+  Hip adduction         Hip internal rotation         Hip external rotation         Knee flexion         Knee extension 5 4 5/4+  5/4+  Ankle dorsiflexion 5 4 5/4- 5/4+ 5/4-  Ankle plantarflexion       RT/LT  15/25 and LT 9/25  Ankle inversion 4+ 4- 4+/4 5/ 4+ 5/4+  Ankle eversion 4+ 4- 4+/4 5/4+ 5/4+   (Blank rows = not tested)       FUNCTIONAL TESTS:  5 times sit to stand: 11.15 6 MWT [id number] ft 1290-1755 ft            Sustained bridge  32 seconds             SLS RT 23 sec  LT 10sec but pain   [facility]sterisk Signs Eval (12-7- 2023) 10-15-22  10-20-22  11/05/2022  11-24-22  12-01-22  12-15-22  5 x  STS 11.73 sec but wt bears to RT 7.66 sec wt bears to RT   9.66 sec with equal wt bear  9 secs     8.64 sec  Max pain LT great toe 9/10 3/10  3/10 most of the time but occassionally 9/10 as in today   1-2/10 in ankle and toe  65 PIP GREAT toe LT  65 PIP GREAT toe LT   Max pain 5/10   6MWT   824 ft 1290-1755 ft NT   966 ft     919 ft feeling fatigued  HR 115   sustained bridge  32 sec    Cramping in hamstring NT        85 sec  but decreaseavailable range   SLS RT 23/LT 10 sec  RT 23 LT 7 sec  RT < 3  LT < 3 checked with and without shoes       RT < 6 LT < 3 checked with and without shoes  DF LT  DF 0 LT DF- 0 LT   DF 20f   Floor to stand x fer         Unable to perform  Hip abduction       4-/4- LT/RT      GAIT: Distance walked: 150  Status 12-15-22  919 ft fatigued Assistive device utilized: None Level of assistance: Complete Independence Comments: antalgic gait left, with trunk lean to RT Pt with hammer toes and ulcer on LT great toe healing , with an extension pad.  Pt is unable to  depress 1st ray with gait in stance/ push off phase, Pt tends to toe grip with shoes and is wearing more sneakers rather than sandals presently.    TODAY'S TREATMENT:  [facility] Adult PT Treatment:                                                 DATE: 12-15-22  Therapeutic Activity: Floor transfer education and demonstration with various techniques from getting on floor using mat and chair.  Pt requiring min assist to get on floor and mod assist to rise from ground Practicing components of transfer with lunging and various ranges until comfortable for tall kneeling and 1/2 kneeling.   Self Care: Educated on progressive strengthening and consistent walking program to build endurance.  Pt with 6 MWT decreased due to pt with lapse of consistent participation due to husband needs.   Devised plan of journaling daily activity and exercise and showed how to utilize I phone to count steps  [facility] Adult PT Treatment:                                                DATE: 12-01-22 ROM measurements Therapeutic Exercise: Step over yoga block LT to RT and then RT to LT 3 x 10 with 2 Lb weight on LT Modified mountain climber with UE on elevated mat 2 x 10 Goblet squat to chair with pause 2 x 10 30 # Lunge 1/2 way with UE support 10 x on RT and LT each Heel raise BIL SL heel raise 1 x 10 on LT 1/2 range Eversion with GTB  2 x 10 Woodpecker anterior  LT LE SL3 x 10 Manual Therapy: LT great toe distraction and Toe flex/ext Put heel lift in shoe Therapeutic Activity:  Pt attempted to get to ground but limited by limited flexibility in knees etc.  Began lunges today and will continue to progress as pt is able for fall preparedness.   [facility] Adult PT Treatment:                                                DATE: 11-24-22 Therapeutic Exercise: Step over yoga block LT to RT and then RT to LT 3 x 10 Goblet squat to chair with pause 2 x 10  25 # Reverse Clam LT sidelying 1 x 10 PTT with GTB sitting with figure 4 2 x 10 Eversion with GTB  2 x 10 Neuromuscular re-ed: Gait training with mirror with verbal and visual cues for proper form/ technique performing multiple times to promote intrinsic feedback and practice walking  inside on level surface.  In front of mobile mirror  [facility] Adult PT Treatment:                                                DATE: 11-12-22 Therapeutic Exercise: Woodpecker anterior  LT LE SL3 x 10 Woodpecker lateral LT LE SL 3 x 10 Woodpecker lateral LT LE  SL 3 x 10  Neuromuscular re-ed: Gait training in // with arch tape and verbal and visual cues for proper form/ technique performing multiple times to promote intrinsic feedback and practice walking outside of the //.  In front of mobile mirror Manual Therapy: Arch taping on the L to promote ankle support    [facility] Adult PT Treatment:                                                DATE: 11/05/22 Therapeutic Exercise: Standing arch building exercise in //  Neuromuscular re-ed: Gait training in // with arch tape and verbal and visual cues for proper form/ technique performing multiple times to promote intrinsic feedback and practice walking outside of the //.  Manual Therapy: Arch taping on the L to promote ankle support  Therapeutic Activity: 6 min walk increased to 966 ft 5 x STS 9 seconds   [facility] Adult PT Treatment:                                                DATE: 11/02/2021 Therapeutic Exercise: Revers clam shell 2 x 10 Clamshell 2 x 15 Manual Therapy: MTRP along the R glute med  Tack and stretch of the R glute med LAD RLE grade IV with oscillations LLE talocrural LAD grade III Therapeutic Activity: Reviewed MMT results and impact on ADLs and benefit of strengthening. Additionally utilizing 1 rest day a week to allow for muscle recovery and continue to further strength.   [facility] Adult PT Treatment:                                                DATE: 10/29/2021 Therapeutic Exercise: Seated hip ER with RTB 3 x 10 Ankle posterior tib strengthening 3 x10  Manual Therapy: IASTM along the gastroc/ soleus and peroneals Sub-talar mobs grade III in prone for supination/ pronation, talocural LAD grad III Edema reduction massage in  prone  Trigger Point Dry-Needling  Treatment instructions: Expect mild to moderate muscle soreness. S/S of pneumothorax if dry needled over a lung field, and to seek immediate medical attention should they occur. Patient verbalized understanding of these instructions and education.  Patient Consent Given: Yes Education handout provided: Previously provided Muscles treated: peroneal longus/ brevis, gastro Electrical stimulation performed: No Parameters: N/A Treatment response/outcome: twitch response, and muscle lengthening.   Neuromuscular re-ed: Gait training with trekking poles 2 x 180 ft, 2nd set focused on redirection of pt's attention which eased her apprehension of falling.     PATIENT EDUCATION: updated 10/29/2022 Education details: for Edema reduction her husband could use lotion and perform the technique on her ankle while she is in prone. Discussed compression stocking to get something taut but comfortable(otherwise too tight may be counter productive) and could trial using ace wraps to see how she does with it before purchasing compression stockings.  Person educated: Patient Education method: Explanation Education comprehension: verbalized understanding and needs further education   HOME EXERCISE PROGRAM:  Access Code: [email or web address] URL: [email or web address] Date: 12/01/2022 Prepared by: [name]  Program Notes Toe Yoga as shown  in clinicDorsiflexion stretch on footstool with power band as shown in clinic.  Buy on [employer] with various strengths  Exercises - Seated Toe Raise  - 1 x daily - 7 x weekly - 2 sets - 10 reps - Seated Calf Stretch with Strap  - 1 x daily - 7 x weekly - 2 sets - 3 reps - 30 hold - Standing 3-Way Leg Reach with Resistance at Ankles and Counter Support  - 1 x daily - 7 x weekly - 3 sets - 10 reps - Sit to stand with sink support Movement snack  - 1 x daily - 7 x weekly - 3 sets - 10 reps - Single Leg Stance with Support   - 2 x daily - 7 x weekly - 1 sets - 3-5 reps - hold 15 sec hold - Heel raise with counter support and towel under toes  - 1 x daily - 7 x weekly - 3 sets - 10 reps - Seated Figure 4 Ankle Inversion with Resistance  - 1 x daily - 7 x weekly - 3 sets - 10 reps - Seated Ankle Eversion with Resistance  - 1 x daily - 7 x weekly - 3 sets - 10 reps - Hip Hiking on Step  - 1 x daily - 7 x weekly - 3 sets - 10 reps - Sidelying Hip Abduction  - 1 x daily - 7 x weekly - 3 sets - 12 reps - Woodpecker one leg anterior, lateral and medial  - 1 x daily - 7 x weekly - 6 sets - 10 reps - Sidelying Reverse Clamshell with Resistance  - 1 x daily - 7 x weekly - 3 sets - 10 reps - Clamshell with Resistance  - 1 x daily - 7 x weekly - 3 sets - 10 reps - Goblet Squat with Kettlebell  - 1 x daily - 7 x weekly - 3 sets - 10 reps - Lunge with Counter Support  - 1 x daily - 7 x weekly - 3 sets - 10 reps - [employer] on Counter  - 1 x daily - 7 x weekly - 3 sets - 10 reps - Single Leg Heel Raise with Counter Support  - 1 x daily - 7 x weekly - 3 sets - 10 reps ASSESSMENT:   CLINICAL IMPRESSION: Ms Dazey arrives to clinic and complaining of 4/10 pain in LT ankle Great toe. Pt session concentrated on Self care for importance of consistent endurance and strengthening exercises.  Pt needed to be able to document daily activity for inactivity awareness.   Pt is presently caring for husband post surgery and has not prioritized her own self care.   Discussed prioritizing her care and importance.  Pt has significant functional weakness with bridging activities and significant trendelenberg gait due to glut me weakness.Pt with improved ankle mobility as seen on 12-01-22  except Dorsiflexion 0 and with bony block stable but not moving.  Pt will benefit from PT extension in order to reinforce HEP utilizing supersets and AMRAP/EMOM that she can utilize for dailiy exercise to cover all major muscle groups with proper and beneficial  technique for maximum benefit. Pt also would benefit from being independent with floor transfers for fall preparedness and decrease risk of falls due to balance deficits   OBJECTIVE IMPAIRMENTS: Abnormal gait, decreased balance, difficulty walking, decreased ROM, decreased strength, hypomobility, obesity, and pain.    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    REHAB POTENTIAL: Good   CLINICAL DECISION MAKING: Evolving/moderate complexity   EVALUATION COMPLEXITY: Moderate     GOALS: Goals reviewed with patient? No   SHORT TERM GOALS: Target date: 10-29-21 Pt will be independent with intial HEP Baseline:no knowledge,  10-07-22 initial HEP given  Goal status: MET   2.  Pt will be able perform STS with even wt distribution in bil LE Baseline: Pt wt bears to RT in STS Goal status: MET   3.  Pt pain with functional activities in Great toe will be reduced to 5/10  Baseline: 9/10 10-15-22  3/10 Goal status: MET     LONG TERM GOALS: Target date: 11-26-22 revised to 01-26-23   Pt will be independent with advanced HEP including AMRAP/EMOM supersets for building strength and endurance (POC 01-26-23 Baseline: Pt needs reinforcement of HEP for proper technique and execution Goal status: ONGOING 01-26-23 Revised   2.  FOTO will improve from  51%  to   64%  indicating improved functional mobility.  Baseline: eval 51% 10-20-22 73%  65% 12-15-22 Goal status: MET   3.  Pain will decrease to 3/10 with all functional activities from 9/10 Baseline: Pt max pain after functional activities. 4-5/10 05-28-23 Status: continues to have pain Goal status:  ONGOING 01-26-23   4.  Pt will be able to perform 6MWT within normal range for age Baseline: [place] ft 1290-1755 ft  12-15-22 949f Goal status:ONGOING 12-15-22   5.  Pt will be able to perform balance with SLS for at least 15 sec on RT and LT to show improved balance/strength in SL activities Baseline: RT 23 sec  LT 10 sec tries to use UE 10-20-22 < 3 sec BIL today Status: 12-15-22  LT 3 sec and RT 4 sec 06-15-23 RT 6 sec, LT , 3 sec Goal status: Revised for 01-26-23    6.  Pt will be able to perform floor to stand transfer to show  increased strength and safe fall preparedness  Baseline: unable to perform Status: unable to perform 12-15-22  Pt needs min assist to rise and VC for LE positioning Goal status: ONGOING 01-26-23    7. Demonstrate 4 + /5 LT LE strength to improve stability, safety and endurance of community level function            Baseline: 4-/5 10-20-22  See flow chart See flow chart  hip abd 3+-4-/5            Goal status: ONGOING 01-26-23  8.  Ms SFein will show improvement in walking steps from 2500 steps a day to at least 5000 steps a day or more Baseling 2500 steps a day Goal Statues INITIAL 01-26-23            PLAN:   PT FREQUENCY: 1-2x/week   PT DURATION: 8 weeks   PLANNED INTERVENTIONS: Therapeutic exercises, Therapeutic activity, Neuromuscular re-education, Balance training, Gait training, Patient/Family education, Self Care, Joint mobilization, Stair training, Dry Needling, Electrical stimulation, Cryotherapy, Moist heat, Taping, Ionotophoresis 468mml Dexamethasone, Manual therapy, and Re-evaluation   PLAN FOR NEXT SESSION: TPDN, Manual, Response to edema reduction.  Gait training, how was arch taping. R glute med strengthening.   May add prone quad stretch to HEP  [name], PT, [place] Certified Exercise Expert for the Aging Adult  12/15/22 1:33 PM Phone: [phone] Fax: [phone]

## 2022-12-16 NOTE — Therapy (Signed)
OUTPATIENT PHYSICAL THERAPY TREATMENT NOTE/ERO     Patient Name: Elizabeth Wang MRN: [id number] DOB:[birth year], 77 y.o., female Today's Date: 12/17/2022  PCP: Ginger Organ. MD  REFERRING PROVIDER: Armond Hang, MD   END OF SESSION:   PT End of Session - 12/17/22 0848     Visit Number 16    Number of Visits 21    Date for PT Re-Evaluation 01/26/23    Authorization Type MCR/ BCBS    Progress Note Due on Visit 36    PT Start Time 0845    PT Stop Time 0930    PT Time Calculation (min) 45 min    Activity Tolerance Patient tolerated treatment well    Behavior During Therapy [facility] for tasks assessed/performed                         Past Medical History:  Diagnosis Date   Acute pulmonary embolism ([place]) 06/2013   Arthritis    Asthma    years ago    Clotting disorder (HCC)    DVT/PE   Diverticulosis    minor    Dyslipidemia    Esophageal reflux    Hypertension    Hypothyroidism    Numbness    TOES   Obesity    OSA (obstructive sleep apnea)    severe OSA with an AHI of 51/hr and nocturnal hypoxemia with O2 sats at low as 69%.   Paroxysmal atrial fibrillation (HCC)    1 isolated episode in the setting of acute stress with no further episodes.    Pseudogout    Past Surgical History:  Procedure Laterality Date   BREAST RECONSTRUCTION     CARDIOVERSION N/A 06/27/2020   Procedure: CARDIOVERSION;  Surgeon: [name], MD;  Location: [facility];  Service: Cardiovascular;  Laterality: N/A;   CESAREAN SECTION     COLONOSCOPY  2004   repeat in ten years   KNEE ARTHROSCOPY Left    TONSILLECTOMY     TOTAL KNEE ARTHROPLASTY Right 01/01/2014   Procedure: TOTAL RIGHT KNEE ARTHROPLASTY;  Surgeon: [name], MD;  Location: [facility];  Service: Orthopedics;  Laterality: Right;   TOTAL KNEE ARTHROPLASTY Left 07/23/2014   Procedure: LEFT TOTAL KNEE ARTHROPLASTY;  Surgeon: [name], MD;  Location: [facility];  Service: Orthopedics;  Laterality: Left;    tummy tuck  1987   WISDOM TOOTH EXTRACTION     Patient Active Problem List   Diagnosis Date Noted   Closed fracture of proximal phalanx of lesser toe of right foot 09/01/2022   Osteoarthritis of talonavicular joint 09/01/2022   Closed fracture of fifth metatarsal bone 09/05/2021   Contusion of left knee 04/05/2019   Pain in left knee 04/03/2019   Osteoarthritis of right glenohumeral joint 11/24/2017   Pain of left great toe 12/17/2016   Skin ulcer of left great toe, limited to breakdown of skin ([place]) 12/17/2016   Gout 01/12/2014   S/P total knee arthroplasty 01/11/2014   Hypokalemia 01/03/2014   Hyponatremia 01/03/2014   Postoperative anemia due to acute blood loss 01/02/2014   Hypothyroidism 01/02/2014   Other and unspecified hyperlipidemia 01/02/2014   OA (osteoarthritis) of knee 01/01/2014   Essential hypertension, benign 11/28/2013   Pulmonary embolism ([place]) 11/28/2013   DVT (deep venous thrombosis) ([place]) 08/09/2013   Other pulmonary embolism and infarction 08/09/2013   Persistent atrial fibrillation ([place]) 07/14/2013    REFERRING DIAG: M25.572 (ICD-10-CM) - Pain in left ankle and  joints of left foot    THERAPY DIAG:  Difficulty in walking, not elsewhere classified  Stiffness of left ankle, not elsewhere classified  Pain of left great toe  Rationale for Evaluation and Treatment Rehabilitation  PERTINENT HISTORY: 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 Yesterday, I had plans for walking but it did not work.   PAIN:  2-22-4   0/10 12-15-22 4/10 LT  ankle  Are you having pain? Yes: NPRS scale: 0/10 Pain location: great toe LT,  unable to descend 1st ray Pain description: dull ache Aggravating factors: walking on it when I walk > 7000 steps,  just living,  sometimes just throbbing, steps Relieving factors: wearing a toe pad, salonpas topical No pain in my left ankle, uncomfortable because it does not bend   OBJECTIVE: (objective measures completed at initial evaluation unless otherwise dated)   DIAGNOSTIC FINDINGS: x rays recently taken but not available at [place]:  FOTO 51%  predicted 64% 10-20-22 73% 11/02/2022 49% 12-15-22 65%   COGNITION: Overall cognitive status: Within functional limits for tasks assessed                         SENSATION: WFL   EDEMA:  Figure 8: RT 54 cm, LT 61 cm 10-15-22 10:30 AM  LT 59.5 cm 12-15-22  RT 54, LT  59 cm   MUSCLE LENGTH: Hamstrings: Right 53 deg; Left 50 deg  10-15-22 LLD 31 in ASIS to malleolus LT  RT 31.75 inch     POSTURE: rounded shoulders, forward head, flexed trunk , and obesity   PALPATION: Not pain but increased stiffness / blocked talar glide   LOWER EXTREMITY ROM:   Active ROM Right eval Left eval LT 10-15-22 10-20-22 LT 12-01-22  Hip flexion         Hip extension         Hip abduction         Hip adduction         Hip internal rotation         Hip external rotation         Knee flexion  Knee extension         Ankle dorsiflexion 5 +9 from neutral 0 0 0  Ankle plantarflexion 65 51  55 60  Ankle inversion 27 14  25 25  $ Ankle eversion 20 14  15 29  $ Great toe distal flexion 50 25  39 65  Great toe  extension 45 10  10 10   $ (Blank rows = not tested)   LOWER EXTREMITY MMT:   MMT Right eval Left eval RT/LT 10-20-22 RT/ LT 11/02/2022 RT/LT 12-15-22  Hip flexion 4+ 4 4+/4 4+/4+ 4+/4+  Hip extension 4 4- 4/4 4/4 4/4  Hip abduction 4- 4- 4-/4- 4-/4- 4-/3+  Hip adduction         Hip internal rotation         Hip external rotation          Knee flexion         Knee extension 5 4 5/4+  5/4+  Ankle dorsiflexion 5 4 5/4- 5/4+ 5/4-  Ankle plantarflexion       RT/LT  15/25 and LT 9/25  Ankle inversion 4+ 4- 4+/4 5/ 4+ 5/4+  Ankle eversion 4+ 4- 4+/4 5/4+ 5/4+   (Blank rows = not tested)       FUNCTIONAL TESTS:  5 times sit to stand: 11.15 6 MWT [id number] ft 1290-1755 ft            Sustained bridge  32 seconds             SLS RT 23 sec  LT 10sec but pain   [facility] Signs Eval (12-7- 2023) 10-15-22  10-20-22  11/05/2022  11-24-22  12-01-22  12-15-22  5 x  STS 11.73 sec but wt bears to RT 7.66 sec wt bears to RT   9.66 sec with equal wt bear  9 secs     8.64 sec  Max pain LT great toe 9/10 3/10  3/10 most of the time but occassionally 9/10 as in today   1-2/10 in ankle and toe  65 PIP GREAT toe LT  65 PIP GREAT toe LT   Max pain 5/10   6MWT   824 ft 1290-1755 ft NT   966 ft     919 ft feeling fatigued  HR 115   sustained bridge  32 sec    Cramping in hamstring NT        85 sec  but decreaseavailable range   SLS RT 23/LT 10 sec  RT 23 LT 7 sec  RT < 3  LT < 3 checked with and without shoes       RT < 6 LT < 3 checked with and without shoes  DF LT  DF 0 LT DF- 0 LT   DF 85f   Floor to stand x fer         Unable to perform  Hip abduction       4-/4- LT/RT      GAIT: Distance walked: 150  Status 12-15-22  919 ft fatigued Assistive device utilized: None Level of assistance: Complete Independence Comments: antalgic gait left, with trunk lean to RT Pt with hammer toes and ulcer on LT great toe healing , with an extension pad.  Pt is unable to depress 1st ray with gait in stance/ push off phase, Pt tends to toe grip with shoes and is wearing more sneakers rather than sandals presently.    [facility] TREATMENT[facility]  [facility]  [facility] 12[facility] EMOM and AMRAP Therapeutic Exercise: 3 way soleus sitting with 15 # Worked on lunge technique Step over yoga block LT to RT  and then RT to LT 3 x 10 Goblet squat to chair with pause 2 x 10  25 #  AMRAP  UE/LE 2 min each 3 rounds With VC and TC for correct execution Bench press   [facility] climber Side lying hip abduction  [facility] Adult PT Treatment:                                                DATE: 12-15-22  Therapeutic Activity: Floor transfer education and demonstration with various techniques from getting on floor using mat and chair.  Pt requiring min assist to get on floor and mod assist to rise from ground Practicing components of transfer with lunging and various ranges until comfortable for tall kneeling and 1/2 kneeling.   Self Care: Educated on progressive strengthening and consistent walking program to build endurance.  Pt with 6 MWT decreased due to pt with lapse of consistent participation due to husband needs.   Devised plan of journaling daily activity and exercise and showed how to utilize I phone to count steps  [facility] Adult PT Treatment:                                                DATE: 12-01-22 ROM measurements Therapeutic Exercise: Step over yoga block LT to RT and then RT to LT 3 x 10 with 2 Lb weight on LT Modified mountain climber with UE on elevated mat 2 x 10 Goblet squat to chair with pause 2 x 10 30 # Lunge 1/2 way with UE support 10 x on RT and LT each Heel raise BIL SL heel raise 1 x 10 on LT 1/2 range Eversion with GTB  2 x 10 Woodpecker anterior  LT LE SL3 x 10 Manual Therapy: LT great toe distraction and Toe flex/ext Put heel lift in shoe Therapeutic Activity:  Pt attempted to get to ground but limited by limited flexibility in knees etc.  Began lunges today and will continue to progress as pt is able for fall preparedness.   [facility] Adult PT Treatment:                                                DATE: 11-24-22 Therapeutic Exercise: Step over yoga block LT to RT and then RT to LT 3 x 10 Goblet squat to chair with pause 2 x 10  25 # Reverse Clam LT sidelying 1 x  10 PTT with GTB sitting with figure 4 2 x 10 Eversion with GTB  2 x 10 Neuromuscular re-ed: Gait training with mirror with verbal and visual cues for proper form/ technique performing multiple times to promote intrinsic feedback and practice walking inside on level surface.  In front of mobile mirror  [facility] Adult PT Treatment:  DATE: 11-12-22 Therapeutic Exercise: Woodpecker anterior  LT LE SL3 x 10 Woodpecker lateral LT LE SL 3 x 10 Woodpecker lateral LT LE SL 3 x 10  Neuromuscular re-ed: Gait training in // with arch tape and verbal and visual cues for proper form/ technique performing multiple times to promote intrinsic feedback and practice walking outside of the //.  In front of mobile mirror Manual Therapy: Arch taping on the L to promote ankle support    [facility] Adult PT Treatment:                                                DATE: 11/05/22 Therapeutic Exercise: Standing arch building exercise in //  Neuromuscular re-ed: Gait training in // with arch tape and verbal and visual cues for proper form/ technique performing multiple times to promote intrinsic feedback and practice walking outside of the //.  Manual Therapy: Arch taping on the L to promote ankle support  Therapeutic Activity: 6 min walk increased to 966 ft 5 x STS 9 seconds   [facility] Adult PT Treatment:                                                DATE: 11/02/2021 Therapeutic Exercise: Revers clam shell 2 x 10 Clamshell 2 x 15 Manual Therapy: MTRP along the R glute med  Tack and stretch of the R glute med LAD RLE grade IV with oscillations LLE talocrural LAD grade III Therapeutic Activity: Reviewed MMT results and impact on ADLs and benefit of strengthening. Additionally utilizing 1 rest day a week to allow for muscle recovery and continue to further strength.   [facility] Adult PT Treatment:                                                DATE: 10/29/2021 Therapeutic  Exercise: Seated hip ER with RTB 3 x 10 Ankle posterior tib strengthening 3 x10  Manual Therapy: IASTM along the gastroc/ soleus and peroneals Sub-talar mobs grade III in prone for supination/ pronation, talocural LAD grad III Edema reduction massage in prone  Trigger Point Dry-Needling  Treatment instructions: Expect mild to moderate muscle soreness. S/S of pneumothorax if dry needled over a lung field, and to seek immediate medical attention should they occur. Patient verbalized understanding of these instructions and education.  Patient Consent Given: Yes Education handout provided: Previously provided Muscles treated: peroneal longus/ brevis, gastro Electrical stimulation performed: No Parameters: N/A Treatment response/outcome: twitch response, and muscle lengthening.   Neuromuscular re-ed: Gait training with trekking poles 2 x 180 ft, 2nd set focused on redirection of pt's attention which eased her apprehension of falling.     PATIENT EDUCATION: updated 10/29/2022 Education details: for Edema reduction her husband could use lotion and perform the technique on her ankle while she is in prone. Discussed compression stocking to get something taut but comfortable(otherwise too tight may be counter productive) and could trial using ace wraps to see how she does with it before purchasing compression stockings. Added AMRAP exercise for endurance strength program Person educated: Patient Education method: Explanation Education comprehension:  verbalized understanding and needs further education   HOME EXERCISE PROGRAM:  Access Code: [email or web address] URL: [email or web address] Date: 12/01/2022 Prepared by: [name]  Program Notes Toe Yoga as shown in clinicDorsiflexion stretch on footstool with power band as shown in clinic.  Buy on [employer] with various strengths  Exercises - Seated Toe Raise  - 1 x daily - 7 x weekly - 2 sets - 10 reps - Seated Calf Stretch  with Strap  - 1 x daily - 7 x weekly - 2 sets - 3 reps - 30 hold - Standing 3-Way Leg Reach with Resistance at Ankles and Counter Support  - 1 x daily - 7 x weekly - 3 sets - 10 reps - Sit to stand with sink support Movement snack  - 1 x daily - 7 x weekly - 3 sets - 10 reps - Single Leg Stance with Support  - 2 x daily - 7 x weekly - 1 sets - 3-5 reps - hold 15 sec hold - Heel raise with counter support and towel under toes  - 1 x daily - 7 x weekly - 3 sets - 10 reps - Seated Figure 4 Ankle Inversion with Resistance  - 1 x daily - 7 x weekly - 3 sets - 10 reps - Seated Ankle Eversion with Resistance  - 1 x daily - 7 x weekly - 3 sets - 10 reps - Hip Hiking on Step  - 1 x daily - 7 x weekly - 3 sets - 10 reps - Sidelying Hip Abduction  - 1 x daily - 7 x weekly - 3 sets - 12 reps - Woodpecker one leg anterior, lateral and medial  - 1 x daily - 7 x weekly - 6 sets - 10 reps - Sidelying Reverse Clamshell with Resistance  - 1 x daily - 7 x weekly - 3 sets - 10 reps - Clamshell with Resistance  - 1 x daily - 7 x weekly - 3 sets - 10 reps - Goblet Squat with Kettlebell  - 1 x daily - 7 x weekly - 3 sets - 10 reps - Lunge with Counter Support  - 1 x daily - 7 x weekly - 3 sets - 10 reps - [employer] on Counter  - 1 x daily - 7 x weekly - 3 sets - 10 reps - Single Leg Heel Raise with Counter Support  - 1 x daily - 7 x weekly - 3 sets - 10 reps  Access Code: [email or web address] URL: [email or web address] Date: 12/17/2022 Prepared by: [name]  Program Notes Do this as an AMRAP (as many repetitions as possible)Do for one minute increments.  Hip abduction  30 sec on each side.  Do 3 to 4 rounds   AMRAP Exercises - Bench press for a minute  - 1 x daily - 7 x weekly - 3 sets - 10 reps - Bridge for 1 minute  - 1 x daily - 7 x weekly - 3 sets - 10 reps - sidelying hip abduction for 30 seconds  - 1 x daily - 7 x weekly - 3 sets - 10 reps - [employer] for one minute  - 1 x daily -  7 x weekly - 3 sets - 10 reps ASSESSMENT:   CLINICAL IMPRESSION: Ms Betanzos arrives to clinic with 0/10 pain.  Pt ready to learn new exercises. Pt desiring to have specific routing.  Pt introduced to [facility] and endurance training. Pt with Trendelenburg gait and hip  abductor weakness.  Pt needs reinforcement and repetition in order to be able to carryover care at home for herself.  Will continue to work toward completion of goals.  Pt left clinic with no adverse effects and in no pain but did have fatigue after exercise.  Ms Skehan arrives to clinic and complaining of 4/10 pain in LT ankle Great toe. Pt session concentrated on Self care for importance of consistent endurance and strengthening exercises.  Pt needed to be able to document daily activity for inactivity awareness.   Pt is presently caring for husband post surgery and has not prioritized her own self care.   Discussed prioritizing her care and importance.  Pt has significant functional weakness with bridging activities and significant trendelenberg gait due to glut me weakness.Pt with improved ankle mobility as seen on 12-01-22  except Dorsiflexion 0 and with bony block stable but not moving.  Pt will benefit from PT extension in order to reinforce HEP utilizing supersets and AMRAP/EMOM that she can utilize for dailiy exercise to cover all major muscle groups with proper and beneficial technique for maximum benefit. Pt also would benefit from being independent with floor transfers for fall preparedness and decrease risk of falls due to balance deficits   OBJECTIVE IMPAIRMENTS: Abnormal gait, decreased balance, difficulty walking, decreased ROM, decreased strength, hypomobility, obesity, and pain.    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    REHAB POTENTIAL: Good   CLINICAL DECISION MAKING: Evolving/moderate complexity   EVALUATION COMPLEXITY: Moderate     GOALS: Goals reviewed with patient? No   SHORT TERM GOALS: Target date: 10-29-21 Pt will be independent with intial HEP Baseline:no knowledge,  10-07-22 initial HEP given  Goal status: MET   2.  Pt will be able perform STS with even wt distribution in bil LE Baseline: Pt wt bears to RT in STS Goal status: MET   3.  Pt pain with functional activities in Great toe will be reduced to 5/10  Baseline: 9/10 10-15-22  3/10 Goal status: MET     LONG TERM GOALS: Target date: 11-26-22 revised to 01-26-23   Pt will be independent with advanced HEP including AMRAP/EMOM supersets for building strength and endurance (POC 01-26-23 Baseline: Pt needs reinforcement of HEP for proper technique and execution Goal status: ONGOING 01-26-23 Revised   2.  FOTO will improve from  51%  to   64%  indicating improved functional mobility.  Baseline: eval 51% 10-20-22 73%  65% 12-15-22 Goal status: MET   3.  Pain will decrease to 3/10 with all functional activities from 9/10 Baseline: Pt max pain after functional activities. 4-5/10 05-28-23 Status: continues to have pain Goal status: ONGOING 01-26-23   4.  Pt will be able to perform 6MWT within normal range for age Baseline: [place] ft 1290-1755 ft  12-15-22 910f Goal status:ONGOING 12-15-22   5.  Pt will be able to perform balance with SLS for at least 15 sec on RT and LT to show improved balance/strength in SL activities Baseline: RT 23 sec  LT 10 sec tries to use UE 10-20-22 < 3 sec BIL today Status: 12-15-22  LT 3 sec and RT 4 sec 06-15-23 RT 6 sec, LT , 3 sec  Goal status: Revised for 01-26-23    6.  Pt will be able to perform floor to stand transfer to show  increased strength and safe fall  preparedness  Baseline: unable to perform Status: unable to perform 12-15-22  Pt needs min assist to rise and VC for LE positioning Goal status: ONGOING 01-26-23    7. Demonstrate 4 + /5 LT LE strength to improve stability, safety and endurance of community level function            Baseline: 4-/5 10-20-22  See flow chart See flow chart  hip abd 3+-4-/5            Goal status: ONGOING 01-26-23  8.  Ms Alderfer  will show improvement in walking steps from 2500 steps a day to at least 5000 steps a day or more Baseling 2500 steps a day Goal Statues INITIAL 01-26-23            PLAN:   PT FREQUENCY: 1-2x/week   PT DURATION: 8 weeks   PLANNED INTERVENTIONS: Therapeutic exercises, Therapeutic activity, Neuromuscular re-education, Balance training, Gait training, Patient/Family education, Self Care, Joint mobilization, Stair training, Dry Needling, Electrical stimulation, Cryotherapy, Moist heat, Taping, Ionotophoresis 63m/ml Dexamethasone, Manual therapy, and Re-evaluation   PLAN FOR NEXT SESSION: TPDN, Manual, Response to edema reduction.  Gait training, how was arch taping. R glute med strengthening.   May add prone quad stretch to [facility], [facility]Certified Exercise Expert for the Aging Adult  12/17/22 1:34 PM Phone: [phone]: [phone]

## 2022-12-17 ENCOUNTER — Ambulatory Visit: Payer: Medicare Other | Admitting: Physical Therapy

## 2022-12-17 ENCOUNTER — Encounter: Payer: Self-pay | Admitting: Physical Therapy

## 2022-12-17 DIAGNOSIS — R262 Difficulty in walking, not elsewhere classified: Secondary | ICD-10-CM

## 2022-12-17 DIAGNOSIS — M79675 Pain in left toe(s): Secondary | ICD-10-CM | POA: Diagnosis not present

## 2022-12-17 DIAGNOSIS — M25672 Stiffness of left ankle, not elsewhere classified: Secondary | ICD-10-CM | POA: Diagnosis not present

## 2022-12-28 ENCOUNTER — Ambulatory Visit (INDEPENDENT_AMBULATORY_CARE_PROVIDER_SITE_OTHER): Payer: Medicare Other | Admitting: Podiatry

## 2022-12-28 ENCOUNTER — Encounter: Payer: Self-pay | Admitting: Podiatry

## 2022-12-28 DIAGNOSIS — L97522 Non-pressure chronic ulcer of other part of left foot with fat layer exposed: Secondary | ICD-10-CM

## 2022-12-28 DIAGNOSIS — G629 Polyneuropathy, unspecified: Secondary | ICD-10-CM | POA: Diagnosis not present

## 2022-12-28 NOTE — Progress Notes (Signed)
  Subjective:  Patient ID: Elizabeth Wang, female    DOB: 1946-09-05,   MRN: [id number]  Chief Complaint  Patient presents with   Foot Ulcer    2 week follow up left great toe    77 y.o. female presents for follow-up of left hallux wound. Relates it was doing better has been dressing it and has been in PT to help her gait and prevent rubbing in this area on her toe. Continue PT.     Denies any other pedal complaints. Denies n/v/f/c.   Past Medical History:  Diagnosis Date   Acute pulmonary embolism ([place]) 06/2013   Arthritis    Asthma    years ago    Clotting disorder (HCC)    DVT/PE   Diverticulosis    minor    Dyslipidemia    Esophageal reflux    Hypertension    Hypothyroidism    Numbness    TOES   Obesity    OSA (obstructive sleep apnea)    severe OSA with an AHI of 51/hr and nocturnal hypoxemia with O2 sats at low as 69%.   Paroxysmal atrial fibrillation (HCC)    1 isolated episode in the setting of acute stress with no further episodes.    Pseudogout     Objective:  Physical Exam: Vascular: DP/PT pulses 2/4 bilateral. CFT <3 seconds. Normal hair growth on digits. No edema.  Skin. No lacerations or abrasions bilateral feet. Hyperkeratotic lesion noted to lateral third digit around nail plate and some incurvation of right third digit nail. Left hallux ulcer reopened measures 0.4 cm x 0.2 cm x 0.2 cm   No erythema edema or purulence noted.  Musculoskeletal: MMT 5/5 bilateral lower extremities in DF, PF, Inversion and Eversion. Deceased ROM in DF of ankle joint. Hallux malleus noted.  Neurological: Sensation intact to light touch.   Assessment:   1. Ulcer of great toe, left, with fat layer exposed ([place])   2. Neuropathy         Plan:  Patient was evaluated and treated and all questions answered. Ulcer left hallux wound with fat layer exposed.   -Ulcer debrided procedure below.   -Dressed with offloading pads.  -Off-loading with aperture pads. -Discussed avoiding any  pedicures and staying off of this foot as much as possible.  -Discussed glucose control and proper protein-rich diet.  -Discussed if any worsening redness, pain, fever or chills to call or may need to report to the emergency room. Patient expressed understanding.  -Discussed possible surgery to straighten hallux hammertoe to prevent future ulceration. Did discuss she is in PT now to work on ankle ROM and gait and we will see if this improves the ulceration.   Procedure: Excisional Debridement of Wound Rationale: Removal of non-viable soft tissue from the wound to promote healing.  Anesthesia: none Pre-Debridement Wound Measurements: Overlying callus  Post-Debridement Wound Measurements: 0.4 cm x 0.2 cm x 0.2 cm  Type of Debridement: Sharp Excisional Tissue Removed: Non-viable soft tissue Depth of Debridement: subcutaneous tissue. Technique: Sharp excisional debridement to bleeding, viable wound base.  Dressing: Dry, sterile, compression dressing. Disposition: Patient tolerated procedure well. Patient to return in 2 week for follow-up.  Return in about 2 weeks (around 01/11/2023) for wound check.       Return in about 2 weeks (around 01/11/2023) for wound check.   Lorenda Peck, DPM

## 2022-12-29 NOTE — Therapy (Signed)
OUTPATIENT PHYSICAL THERAPY TREATMENT NOTE/ERO     Patient Name: Elizabeth Wang MRN: [id number] DOB:1946-08-18, 77 y.o., female Today's Date: 12/31/2022  PCP: Cleatis Polka. MD  REFERRING PROVIDER: Netta Cedars, MD   END OF SESSION:   PT End of Session - 12/31/22 1151     Visit Number 17    Number of Visits 21    Date for PT Re-Evaluation 01/26/23    Authorization Type MCR/ BCBS    Progress Note Due on Visit 20    PT Start Time 1151    PT Stop Time 1235    PT Time Calculation (min) 44 min    Activity Tolerance Patient tolerated treatment well    Behavior During Therapy WFL for tasks assessed/performed                          Past Medical History:  Diagnosis Date   Acute pulmonary embolism (HCC) 06/2013   Arthritis    Asthma    years ago    Clotting disorder (HCC)    DVT/PE   Diverticulosis    minor    Dyslipidemia    Esophageal reflux    Hypertension    Hypothyroidism    Numbness    TOES   Obesity    OSA (obstructive sleep apnea)    severe OSA with an AHI of 51/hr and nocturnal hypoxemia with O2 sats at low as 69%.   Paroxysmal atrial fibrillation (HCC)    1 isolated episode in the setting of acute stress with no further episodes.    Pseudogout    Past Surgical History:  Procedure Laterality Date   BREAST RECONSTRUCTION     CARDIOVERSION N/A 06/27/2020   Procedure: CARDIOVERSION;  Surgeon: Jake Bathe, MD;  Location: [facility];  Service: Cardiovascular;  Laterality: N/A;   CESAREAN SECTION     COLONOSCOPY  2004   repeat in ten years   KNEE ARTHROSCOPY Left    TONSILLECTOMY     TOTAL KNEE ARTHROPLASTY Right 01/01/2014   Procedure: TOTAL RIGHT KNEE ARTHROPLASTY;  Surgeon: Loanne Drilling, MD;  Location: [facility];  Service: Orthopedics;  Laterality: Right;   TOTAL KNEE ARTHROPLASTY Left 07/23/2014   Procedure: LEFT TOTAL KNEE ARTHROPLASTY;  Surgeon: Loanne Drilling, MD;  Location: [facility];  Service: Orthopedics;  Laterality: Left;    tummy tuck  1987   WISDOM TOOTH EXTRACTION     Patient Active Problem List   Diagnosis Date Noted   Closed fracture of proximal phalanx of lesser toe of right foot 09/01/2022   Osteoarthritis of talonavicular joint 09/01/2022   Closed fracture of fifth metatarsal bone 09/05/2021   Contusion of left knee 04/05/2019   Pain in left knee 04/03/2019   Osteoarthritis of right glenohumeral joint 11/24/2017   Pain of left great toe 12/17/2016   Skin ulcer of left great toe, limited to breakdown of skin (HCC) 12/17/2016   Gout 01/12/2014   S/P total knee arthroplasty 01/11/2014   Hypokalemia 01/03/2014   Hyponatremia 01/03/2014   Postoperative anemia due to acute blood loss 01/02/2014   Hypothyroidism 01/02/2014   Other and unspecified hyperlipidemia 01/02/2014   OA (osteoarthritis) of knee 01/01/2014   Essential hypertension, benign 11/28/2013   Pulmonary embolism (HCC) 11/28/2013   DVT (deep venous thrombosis) (HCC) 08/09/2013   Other pulmonary embolism and infarction 08/09/2013   Persistent atrial fibrillation (HCC) 07/14/2013    REFERRING DIAG: M25.572 (ICD-10-CM) - Pain in left ankle  and joints of left foot    THERAPY DIAG:  Difficulty in walking, not elsewhere classified  Stiffness of left ankle, not elsewhere classified  Pain of left great toe  Rationale for Evaluation and Treatment Rehabilitation  PERTINENT HISTORY: 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   My pain in the joint of my Toe  1/10. My ankle almost never hurts now.  PAIN:  2-22-4   0/10 12-15-22 4/10  LT ankle  Are you having pain? Yes: NPRS scale: 0/10 Pain location: great toe LT,  unable to descend 1st ray Pain description: dull ache Aggravating factors: walking on it when I walk > 7000 steps,  just living,  sometimes just throbbing, steps Relieving factors: wearing a toe pad, salonpas topical No pain in my left ankle, uncomfortable because it does not bend   OBJECTIVE: (objective measures completed at initial evaluation unless otherwise dated)   DIAGNOSTIC FINDINGS: x rays recently taken but not available at Eval   PATIENT SURVEYS:  FOTO 51%  predicted 64% 10-20-22 73% 11/02/2022 49% 12-15-22 65%   COGNITION: Overall cognitive status: Within functional limits for tasks assessed                         SENSATION: WFL   EDEMA:  Figure 8: RT 54 cm, LT 61 cm 10-15-22 10:30 AM  LT 59.5 cm 12-15-22  RT 54, LT  59 cm   MUSCLE LENGTH: Hamstrings: Right 53 deg; Left 50 deg  10-15-22 LLD 31 in ASIS to malleolus LT  RT 31.75 inch     POSTURE: rounded shoulders, forward head, flexed trunk , and obesity   PALPATION: Not pain but increased stiffness / blocked talar glide   LOWER EXTREMITY ROM:   Active ROM Right eval Left eval LT 10-15-22 10-20-22 LT 12-01-22  Hip flexion         Hip extension         Hip abduction         Hip adduction         Hip internal rotation         Hip external rotation         Knee flexion  Knee extension         Ankle dorsiflexion 5 +9 from neutral 0 0 0  Ankle plantarflexion 65 51  55 60  Ankle inversion 27 14  25 25   Ankle eversion 20 14  15 29   Great toe distal flexion 50 25  39 65  Great toe  extension 45 10  10 10    (Blank rows = not tested)   LOWER EXTREMITY MMT:   MMT Right eval Left eval RT/LT 10-20-22 RT/ LT 11/02/2022 RT/LT 12-15-22  Hip flexion 4+ 4 4+/4 4+/4+ 4+/4+  Hip extension 4 4- 4/4 4/4 4/4  Hip abduction 4- 4- 4-/4- 4-/4- 4-/3+  Hip adduction         Hip internal rotation         Hip external  rotation         Knee flexion         Knee extension 5 4 5/4+  5/4+  Ankle dorsiflexion 5 4 5/4- 5/4+ 5/4-  Ankle plantarflexion       RT/LT  15/25 and LT 9/25  Ankle inversion 4+ 4- 4+/4 5/ 4+ 5/4+  Ankle eversion 4+ 4- 4+/4 5/4+ 5/4+   (Blank rows = not tested)       FUNCTIONAL TESTS:  5 times sit to stand: 11.15 6 MWT [id number] ft 1290-1755 ft            Sustained bridge  32 seconds             SLS RT 23 sec  LT 10sec but pain   [facility] Signs Eval (12-7- 2023) 10-15-22  10-20-22  11/05/2022  11-24-22  12-01-22  12-15-22  5 x  STS 11.73 sec but wt bears to RT 7.66 sec wt bears to RT   9.66 sec with equal wt bear  9 secs     8.64 sec  Max pain LT great toe 9/10 3/10  3/10 most of the time but occassionally 9/10 as in today   1-2/10 in ankle and toe  65 PIP GREAT toe LT  65 PIP GREAT toe LT   Max pain 5/10     824 ft 1290-1755 ft NT   966 ft     919 ft feeling fatigued  HR 115   sustained bridge  32 sec    Cramping in hamstring NT        85 sec  but decreaseavailable range   SLS RT 23/LT 10 sec  RT 23 LT 7 sec  RT < 3  LT < 3 checked with and without shoes       RT < 6 LT < 3 checked with and without shoes  DF LT  DF 0 LT DF- 0 LT   DF 84ft   Floor to stand x fer         Unable to perform  Hip abduction       4-/4- LT/RT      GAIT: Distance walked: 150  Status 12-15-22  919 ft fatigued Assistive device utilized: None Level of assistance: Complete Independence Comments: antalgic gait left, with trunk lean to RT Pt with hammer toes and ulcer on LT great toe healing , with an extension pad.  Pt is unable to depress 1st ray with gait in stance/ push off phase, Pt tends to toe grip with shoes and is wearing more sneakers rather than sandals presently.    TODAY'S TREATMENT:  [facility] Adult PT Treatment:  DATE: 12-31-22 Therapeutic Exercise Educated on  AMRAP process.  Extra time needed to correct form and execution of exercise. AMRAP   UE/LE 1 min each (As Many Repetitions as Possible) 2 rounds   Bridge  60 sec RT Side lying hip adduction  30 sec followed by side lying hip abduction  LT Side lying hip adduction 30 sec followed by side lying hip abduction UE ROW RT 30 sec, LT 30 sec using 15 # wt   AMRAP LE 1 min each   2 rounds Lunge while holding back of chair (modified with cushion and 1/2 range as needed) Sit to stand with weight  15 # KB Lateral step ups  with hip hike 30 sec  on RT and LT Table top position with Both legs and then horizontal abduction with theraband Self Care  Educated on community wellness and you tube modified cardio/strengthening exercises Educated on Pt hip weakness  [facility] Adult PT Treatment:                                                DATE: 12-17-22 EMOM and AMRAP Therapeutic Exercise: 3 way soleus sitting with 15 # Worked on lunge technique Step over yoga block LT to RT and then RT to LT 3 x 10 Goblet squat to chair with pause 2 x 10  25 #  AMRAP  UE/LE 2 min each 3 rounds With VC and TC for correct execution Bench press   [employer] climber Side lying hip abduction  [facility] Adult PT Treatment:                                                DATE: 12-15-22  Therapeutic Activity: Floor transfer education and demonstration with various techniques from getting on floor using mat and chair.  Pt requiring min assist to get on floor and mod assist to rise from ground Practicing components of transfer with lunging and various ranges until comfortable for tall kneeling and 1/2 kneeling.   Self Care: Educated on progressive strengthening and consistent walking program to build endurance.  Pt with 6 MWT decreased due to pt with lapse of consistent participation due to husband needs.   Devised plan of journaling daily activity and exercise and showed how to utilize I phone to count steps  [facility] Adult PT Treatment:                                                DATE: 12-01-22 ROM  measurements Therapeutic Exercise: Step over yoga block LT to RT and then RT to LT 3 x 10 with 2 Lb weight on LT Modified mountain climber with UE on elevated mat 2 x 10 Goblet squat to chair with pause 2 x 10 30 # Lunge 1/2 way with UE support 10 x on RT and LT each Heel raise BIL SL heel raise 1 x 10 on LT 1/2 range Eversion with GTB  2 x 10 Woodpecker anterior  LT LE SL3 x 10 Manual Therapy: LT great toe distraction and Toe flex/ext Put heel lift in shoe Therapeutic Activity:  Pt attempted to get to ground but limited by limited flexibility in knees etc.  Began lunges today and will continue to progress as pt is able for fall preparedness.   [facility] Adult PT Treatment:                                                DATE: 11-24-22 Therapeutic Exercise: Step over yoga block LT to RT and then RT to LT 3 x 10 Goblet squat to chair with pause 2 x 10  25 # Reverse Clam LT sidelying 1 x 10 PTT with GTB sitting with figure 4 2 x 10 Eversion with GTB  2 x 10 Neuromuscular re-ed: Gait training with mirror with verbal and visual cues for proper form/ technique performing multiple times to promote intrinsic feedback and practice walking inside on level surface.  In front of mobile mirror  [facility] Adult PT Treatment:                                                DATE: 11-12-22 Therapeutic Exercise: Woodpecker anterior  LT LE SL3 x 10 Woodpecker lateral LT LE SL 3 x 10 Woodpecker lateral LT LE SL 3 x 10  Neuromuscular re-ed: Gait training in // with arch tape and verbal and visual cues for proper form/ technique performing multiple times to promote intrinsic feedback and practice walking outside of the //.  In front of mobile mirror Manual Therapy: Arch taping on the L to promote ankle support    [facility] Adult PT Treatment:                                                DATE: 11/05/22 Therapeutic Exercise: Standing arch building exercise in //  Neuromuscular re-ed: Gait training in // with arch  tape and verbal and visual cues for proper form/ technique performing multiple times to promote intrinsic feedback and practice walking outside of the //.  Manual Therapy: Arch taping on the L to promote ankle support  Therapeutic Activity: 6 min walk increased to 966 ft 5 x STS 9 seconds   [facility] Adult PT Treatment:                                                DATE: 11/02/2021 Therapeutic Exercise: Revers clam shell 2 x 10 Clamshell 2 x 15 Manual Therapy: MTRP along the R glute med  Tack and stretch of the R glute med LAD RLE grade IV with oscillations LLE talocrural LAD grade III Therapeutic Activity: Reviewed MMT results and impact on ADLs and benefit of strengthening. Additionally utilizing 1 rest day a week to allow for muscle recovery and continue to further strength.   [facility] Adult PT Treatment:  DATE: 10/29/2021 Therapeutic Exercise: Seated hip ER with RTB 3 x 10 Ankle posterior tib strengthening 3 x10  Manual Therapy: IASTM along the gastroc/ soleus and peroneals Sub-talar mobs grade III in prone for supination/ pronation, talocural LAD grad III Edema reduction massage in prone  Trigger Point Dry-Needling  Treatment instructions: Expect mild to moderate muscle soreness. S/S of pneumothorax if dry needled over a lung field, and to seek immediate medical attention should they occur. Patient verbalized understanding of these instructions and education.  Patient Consent Given: Yes Education handout provided: Previously provided Muscles treated: peroneal longus/ brevis, gastro Electrical stimulation performed: No Parameters: N/A Treatment response/outcome: twitch response, and muscle lengthening.   Neuromuscular re-ed: Gait training with trekking poles 2 x 180 ft, 2nd set focused on redirection of pt's attention which eased her apprehension of falling.     PATIENT EDUCATION: updated 10/29/2022 Education details: for Edema reduction  her husband could use lotion and perform the technique on her ankle while she is in prone. Discussed compression stocking to get something taut but comfortable(otherwise too tight may be counter productive) and could trial using ace wraps to see how she does with it before purchasing compression stockings. Added AMRAP exercise for endurance strength program Person educated: Patient Education method: Explanation Education comprehension: verbalized understanding and needs further education   HOME EXERCISE PROGRAM:  Access Code: URL: [email or web address] Date: 12/01/2022 Prepared by: Garen Lah  Program Notes Toe Yoga as shown in clinicDorsiflexion stretch on footstool with power band as shown in clinic.  Buy on [employer] with various strengths  Exercises - Seated Toe Raise  - 1 x daily - 7 x weekly - 2 sets - 10 reps - Seated Calf Stretch with Strap  - 1 x daily - 7 x weekly - 2 sets - 3 reps - 30 hold - Standing 3-Way Leg Reach with Resistance at Ankles and Counter Support  - 1 x daily - 7 x weekly - 3 sets - 10 reps - Sit to stand with sink support Movement snack  - 1 x daily - 7 x weekly - 3 sets - 10 reps - Single Leg Stance with Support  - 2 x daily - 7 x weekly - 1 sets - 3-5 reps - hold 15 sec hold - Heel raise with counter support and towel under toes  - 1 x daily - 7 x weekly - 3 sets - 10 reps - Seated Figure 4 Ankle Inversion with Resistance  - 1 x daily - 7 x weekly - 3 sets - 10 reps - Seated Ankle Eversion with Resistance  - 1 x daily - 7 x weekly - 3 sets - 10 reps - Hip Hiking on Step  - 1 x daily - 7 x weekly - 3 sets - 10 reps - Sidelying Hip Abduction  - 1 x daily - 7 x weekly - 3 sets - 12 reps - Woodpecker one leg anterior, lateral and medial  - 1 x daily - 7 x weekly - 6 sets - 10 reps - Sidelying Reverse Clamshell with Resistance  - 1 x daily - 7 x weekly - 3 sets - 10 reps - Clamshell with Resistance  - 1 x daily - 7 x weekly - 3  sets - 10 reps - Goblet Squat with Kettlebell  - 1 x daily - 7 x weekly - 3 sets - 10 reps - Lunge with Counter Support  - 1 x daily - 7 x weekly - 3  sets - 10 reps - [employer] on Counter  - 1 x daily - 7 x weekly - 3 sets - 10 reps - Single Leg Heel Raise with Counter Support  - 1 x daily - 7 x weekly - 3 sets - 10 reps AMRAP  UE/LE 1 min each (As Many Repetitions as Possible) Can do 2-3 rounds   Bridge  60 sec RT Side lying hip adduction  30 sec followed by side lying hip abduction  LT Side lying hip adduction 30 sec followed by side lying hip abduction UE ROW RT 30 sec, LT 30 sec using 15 # wt   AMRAP LE 1 min each   Can do 2-3 rounds Lunge while holding back of chair (modified with cushion and 1/2 range as needed) Sit to stand with weight  15 # KB Lateral step ups  with hip hike 30 sec  on RT and LT Table top position with Both legs and then horizontal abduction with theraban Access Code: [email or web address] URL: [email or web address] Date: 12/17/2022 Prepared by: Garen Lah  Program Notes Do this as an AMRAP (as many repetitions as possible)Do for one minute increments.  Hip abduction  30 sec on each side.  Do 3 to 4 rounds   AMRAP Exercises - Bench press for a minute  - 1 x daily - 7 x weekly - 3 sets - 10 reps - Bridge for 1 minute  - 1 x daily - 7 x weekly - 3 sets - 10 reps - sidelying hip abduction for 30 seconds  - 1 x daily - 7 x weekly - 3 sets - 10 reps - [employer] for one minute  - 1 x daily - 7 x weekly - 3 sets - 10 reps ASSESSMENT:   CLINICAL IMPRESSION: Ms Amado arrives to clinic with 0/10 pain.   Pt enjoys AMRAP routines and working on posterior chain and proximal hip weakness.  Pt ambulates  with  significant trendelenberg gait due to glut me weakness. Pt requires moderate cuing for correctly performing exercises. Pt with difficulty extending LT great toe for lunging exercise and worked on modifications.   Pt needs reinforcement and  repetition in order to be able to carryover care at home for herself.  Will continue to work toward completion of goals. Pt has trouble with follow through and correctly performing exercises Pt left clinic with no adverse effects and in no pain but did have fatigue after exercise.     OBJECTIVE IMPAIRMENTS: Abnormal gait, decreased balance, difficulty walking, decreased ROM, decreased strength, hypomobility, obesity, and pain.    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    REHAB POTENTIAL: Good   CLINICAL DECISION MAKING: Evolving/moderate complexity   EVALUATION COMPLEXITY: Moderate     GOALS: Goals reviewed with patient? No   SHORT TERM GOALS: Target date: 10-29-21 Pt will be independent with intial HEP Baseline:no knowledge,  10-07-22 initial HEP given  Goal status: MET   2.  Pt will be able perform STS with even wt distribution in bil LE Baseline: Pt wt bears to RT in STS Goal status: MET   3.  Pt pain with functional activities in Great toe will be reduced to 5/10  Baseline: 9/10 10-15-22  3/10 Goal status: MET     LONG TERM GOALS: Target date: 11-26-22 revised to 01-26-23   Pt will be independent with advanced HEP including AMRAP/EMOM supersets for building strength and endurance (POC 01-26-23 Baseline: Pt needs reinforcement of HEP for proper technique and execution Goal status: ONGOING 01-26-23 Revised   2.  FOTO will improve from  51%  to   64%  indicating improved functional mobility.  Baseline: eval 51% 10-20-22 73%  65% 12-15-22 Goal status: MET   3.  Pain will decrease to 3/10 with all functional activities from  9/10 Baseline: Pt max pain after functional activities. 4-5/10 05-28-23 Status: continues to have pain Goal status: ONGOING 01-26-23   4.  Pt will be able to perform within normal range for age Baseline: 824 ft 1290-1755 ft  12-15-22 975ft Goal status:ONGOING 12-15-22   5.  Pt will be able to perform balance with SLS for at least 15 sec on RT and LT to show improved balance/strength in SL activities Baseline: RT 23 sec  LT 10 sec tries to use UE 10-20-22 < 3 sec BIL today Status: 12-15-22  LT 3 sec and RT 4 sec 06-15-23 RT 6 sec, LT , 3 sec Goal status: Revised for 01-26-23    6.  Pt will be able to perform floor to stand transfer to show  increased strength and safe fall preparedness  Baseline: unable to perform Status: unable to perform 12-15-22  Pt needs min assist to rise and VC for LE positioning Goal status: ONGOING 01-26-23    7. Demonstrate 4 + /5 LT LE strength to improve stability, safety and endurance of community level function            Baseline: 4-/5 10-20-22  See flow chart See flow chart  hip abd 3+-4-/5            Goal status: ONGOING 01-26-23  8.  Ms Keegan  will show improvement in walking steps from 2500 steps a day to at least 5000 steps a day or more Baseling 2500 steps a day Goal Statues INITIAL 01-26-23            PLAN:   PT FREQUENCY: 1-2x/week   PT DURATION: 8 weeks   PLANNED INTERVENTIONS: Therapeutic exercises, Therapeutic activity, Neuromuscular re-education, Balance training, Gait training, Patient/Family education, Self Care, Joint mobilization, Stair training, Dry Needling, Electrical stimulation, Cryotherapy, Moist heat, Taping, Ionotophoresis 4mg /ml Dexamethasone, Manual therapy, and Re-evaluation   PLAN FOR NEXT SESSION: TPDN, Manual, Response to edema reduction.  Gait training, how was arch taping. R glute med strengthening.   May add prone quad stretch to HEP   Work on Deadlift form and add posterior chain exercises to [employer] Working on  transitioning to a Systems analyst for continued strengthening.   Garen Lah, PT, [facility] Certified Exercise Expert for the Aging Adult  12/31/22 12:56 PM Phone: [phone] Fax: [phone]

## 2022-12-31 ENCOUNTER — Encounter: Payer: Self-pay | Admitting: Physical Therapy

## 2022-12-31 ENCOUNTER — Ambulatory Visit: Payer: Medicare Other | Attending: Internal Medicine | Admitting: Physical Therapy

## 2022-12-31 DIAGNOSIS — M25672 Stiffness of left ankle, not elsewhere classified: Secondary | ICD-10-CM | POA: Diagnosis not present

## 2022-12-31 DIAGNOSIS — R262 Difficulty in walking, not elsewhere classified: Secondary | ICD-10-CM

## 2022-12-31 DIAGNOSIS — M79675 Pain in left toe(s): Secondary | ICD-10-CM | POA: Diagnosis not present

## 2022-12-31 NOTE — Patient Instructions (Addendum)
AMRAP  UE/LE 1 min each (As Many Repetitions as Possible) Can do 2-3 rounds  Bridge  60 sec RT Side lying hip adduction  30 sec followed by side lying hip abduction  LT Side lying hip adduction 30 sec followed by side lying hip abduction UE ROW RT 30 sec, LT 30 sec using 15 # wt  AMRAP LE 1 min each   Can do 2-3 rounds Lunge while holding back of chair (modified with cushion and 1/2 range as needed) Sit to stand with weight  15 # KB Lateral step ups  with hip hike 30 sec  on RT and LT Table top position with Both legs and then horizontal abduction with theraband   Combination Training/Interval Style 20 min  [email or web address] n, seated options   [name], PT, [facility] Certified Exercise Expert for the Aging Adult  12/31/22 12:04 PM Phone: [phone] Fax: [phone]

## 2023-01-06 DIAGNOSIS — Z79899 Other long term (current) drug therapy: Secondary | ICD-10-CM | POA: Diagnosis not present

## 2023-01-06 DIAGNOSIS — M1991 Primary osteoarthritis, unspecified site: Secondary | ICD-10-CM | POA: Diagnosis not present

## 2023-01-06 DIAGNOSIS — Z6839 Body mass index (BMI) 39.0-39.9, adult: Secondary | ICD-10-CM | POA: Diagnosis not present

## 2023-01-06 DIAGNOSIS — E669 Obesity, unspecified: Secondary | ICD-10-CM | POA: Diagnosis not present

## 2023-01-06 DIAGNOSIS — M1120 Other chondrocalcinosis, unspecified site: Secondary | ICD-10-CM | POA: Diagnosis not present

## 2023-01-06 NOTE — Therapy (Signed)
OUTPATIENT PHYSICAL THERAPY TREATMENT NOTE    Patient Name: Elizabeth Wang MRN: [id number] DOB:Jun 02, 1946, 77 y.o., female Today's Date: 01/07/2023  PCP: Cleatis Polka. MD  REFERRING PROVIDER: Netta Cedars, MD   END OF SESSION:   PT End of Session - 01/07/23 1205     Visit Number 18    Number of Visits 21    Date for PT Re-Evaluation 01/26/23    Authorization Type MCR/ BCBS    Progress Note Due on Visit 20    PT Start Time 1103    PT Stop Time 1158    PT Time Calculation (min) 55 min    Activity Tolerance Patient tolerated treatment well    Behavior During Therapy WFL for tasks assessed/performed                           Past Medical History:  Diagnosis Date   Acute pulmonary embolism (HCC) 06/2013   Arthritis    Asthma    years ago    Clotting disorder (HCC)    DVT/PE   Diverticulosis    minor    Dyslipidemia    Esophageal reflux    Hypertension    Hypothyroidism    Numbness    TOES   Obesity    OSA (obstructive sleep apnea)    severe OSA with an AHI of 51/hr and nocturnal hypoxemia with O2 sats at low as 69%.   Paroxysmal atrial fibrillation (HCC)    1 isolated episode in the setting of acute stress with no further episodes.    Pseudogout    Past Surgical History:  Procedure Laterality Date   BREAST RECONSTRUCTION     CARDIOVERSION N/A 06/27/2020   Procedure: CARDIOVERSION;  Surgeon: Jake Bathe, MD;  Location: [facility];  Service: Cardiovascular;  Laterality: N/A;   CESAREAN SECTION     COLONOSCOPY  2004   repeat in ten years   KNEE ARTHROSCOPY Left    TONSILLECTOMY     TOTAL KNEE ARTHROPLASTY Right 01/01/2014   Procedure: TOTAL RIGHT KNEE ARTHROPLASTY;  Surgeon: Loanne Drilling, MD;  Location: [facility];  Service: Orthopedics;  Laterality: Right;   TOTAL KNEE ARTHROPLASTY Left 07/23/2014   Procedure: LEFT TOTAL KNEE ARTHROPLASTY;  Surgeon: Loanne Drilling, MD;  Location: [facility];  Service: Orthopedics;  Laterality: Left;   tummy  tuck  1987   WISDOM TOOTH EXTRACTION     Patient Active Problem List   Diagnosis Date Noted   Closed fracture of proximal phalanx of lesser toe of right foot 09/01/2022   Osteoarthritis of talonavicular joint 09/01/2022   Closed fracture of fifth metatarsal bone 09/05/2021   Contusion of left knee 04/05/2019   Pain in left knee 04/03/2019   Osteoarthritis of right glenohumeral joint 11/24/2017   Pain of left great toe 12/17/2016   Skin ulcer of left great toe, limited to breakdown of skin (HCC) 12/17/2016   Gout 01/12/2014   S/P total knee arthroplasty 01/11/2014   Hypokalemia 01/03/2014   Hyponatremia 01/03/2014   Postoperative anemia due to acute blood loss 01/02/2014   Hypothyroidism 01/02/2014   Other and unspecified hyperlipidemia 01/02/2014   OA (osteoarthritis) of knee 01/01/2014   Essential hypertension, benign 11/28/2013   Pulmonary embolism (HCC) 11/28/2013   DVT (deep venous thrombosis) (HCC) 08/09/2013   Other pulmonary embolism and infarction 08/09/2013   Persistent atrial fibrillation (HCC) 07/14/2013    REFERRING DIAG: M25.572 (ICD-10-CM) - Pain in left ankle  and joints of left foot    THERAPY DIAG:  Difficulty in walking, not elsewhere classified  Stiffness of left ankle, not elsewhere classified  Pain of left great toe  Rationale for Evaluation and Treatment Rehabilitation  PERTINENT HISTORY: 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   My pain in the joint of my Toe  1/10. My ankle almost never hurts now.  PAIN:  2-22-4   0/10 12-15-22 4/10 LT  ankle 01-07-23  ankle  0/10, toe 8/10 ( under podiatrist care)  Are you having pain? Yes: NPRS scale: 0/10 Pain location: great toe LT,  unable to descend 1st ray Pain description: dull ache Aggravating factors: walking on it when I walk > 7000 steps,  just living,  sometimes just throbbing, steps Relieving factors: wearing a toe pad, salonpas topical No pain in my left ankle, uncomfortable because it does not bend   OBJECTIVE: (objective measures completed at initial evaluation unless otherwise dated)   DIAGNOSTIC FINDINGS: x rays recently taken but not available at Eval   PATIENT SURVEYS:  FOTO 51%  predicted 64% 10-20-22 73% 11/02/2022 49% 12-15-22 65%   COGNITION: Overall cognitive status: Within functional limits for tasks assessed                         SENSATION: WFL   EDEMA:  Figure 8: RT 54 cm, LT 61 cm 10-15-22 10:30 AM  LT 59.5 cm 12-15-22  RT 54, LT  59 cm   MUSCLE LENGTH: Hamstrings: Right 53 deg; Left 50 deg  10-15-22 LLD 31 in ASIS to malleolus LT  RT 31.75 inch     POSTURE: rounded shoulders, forward head, flexed trunk , and obesity   PALPATION: Not pain but increased stiffness / blocked talar glide   LOWER EXTREMITY ROM:   Active ROM Right eval Left eval LT 10-15-22 10-20-22 LT 12-01-22 01-07-23  Hip flexion          Hip extension          Hip abduction          Hip adduction          Hip internal rotation          Hip external  rotation          Knee flexion          Knee extension          Ankle dorsiflexion 5 +9 from neutral 0 0 0 0  Ankle plantarflexion 65 51  55 60 60  Ankle inversion 27 14  25 25 25   Ankle eversion 20 14  15 29 30   Great toe distal flexion 50 25  39 65 65  Great toe  extension 45 10  10 10 12    (Blank rows = not tested)   LOWER EXTREMITY MMT:   MMT Right eval Left eval RT/LT 10-20-22 RT/ LT 11/02/2022 RT/LT 12-15-22 RT/LT   Hip flexion 4+ 4 4+/4 4+/4+ 4+/4+   Hip extension 4 4- 4/4 4/4 4/4   Hip abduction 4-  4- 4-/4- 4-/4- 4-/3+   Hip adduction          Hip internal rotation          Hip external rotation          Knee flexion          Knee extension 5 4 5/4+  5/4+   Ankle dorsiflexion 5 4 5/4- 5/4+ 5/4-   Ankle plantarflexion       RT/LT  15/25 and LT 9/25   Ankle inversion 4+ 4- 4+/4 5/ 4+ 5/4+   Ankle eversion 4+ 4- 4+/4 5/4+ 5/4+    (Blank rows = not tested)       FUNCTIONAL TESTS:  5 times sit to stand: 11.15 6 MWT [id number] ft 1290-1755 ft            Sustained bridge  32 seconds             SLS RT 23 sec  LT 10sec but pain   Asterisk Signs Eval (12-7- 2023) 10-15-22  10-20-22  11/05/2022  11-24-22  12-01-22  12-15-22 01-07-23  5 x  STS 11.73 sec but wt bears to RT 7.66 sec wt bears to RT   9.66 sec with equal wt bear  9 secs     8.64 sec   Max pain LT great toe 9/10 3/10  3/10 most of the time but occassionally 9/10 as in today   1-2/10 in ankle and toe  65 PIP GREAT toe LT  65 PIP GREAT toe LT   Max pain 5/10 65 Great toe LT extenson Max pain 9/10     824 ft 1290-1755 ft NT   966 ft     919 ft feeling fatigued  HR 115    sustained bridge  32 sec    Cramping in hamstring NT        85 sec  but decreaseavailable range    SLS RT 23/LT 10 sec  RT 23 LT 7 sec  RT < 3  LT < 3 checked with and without shoes       RT < 6 LT < 3 checked with and without shoes   DF LT  DF 0 LT DF- 0 LT   DF 74ft    Floor to stand x fer         Unable to perform   Hip abduction       4-/4- LT/RT       GAIT: Distance walked: 150  Status 12-15-22  919 ft fatigued Assistive device utilized: None Level of assistance: Complete Independence Comments: antalgic gait left, with trunk lean to RT Pt with hammer toes  and ulcer on LT great toe healing , with an extension pad.  Pt is unable to depress 1st ray with gait in stance/ push off phase, Pt tends to toe grip with shoes and is wearing more sneakers rather than sandals presently.    TODAY'S TREATMENT:  [facility] Adult PT Treatment:                                                 DATE: 01-07-23 Therapeutic Exercise: AMRAP LE (Lower Extremity)  4 rounds  Hip abduction with GTB VC and TC standing at counter  hold for 3 sec Hip extension  With GTB ( hold for 3 sec)   Heel raise bilaterally  SL heel raise RT  SL heel raise LT Monster walks along counter VC for technique  with GTB STS with 15 pound DB 3 x 10 in each hand SELF CARE Went over how to progress exercises for flexibility, balance, endurance and strength on 2 page handout and answered questions,   [facility] Adult PT Treatment:                                                DATE: 12-31-22 Therapeutic Exercise Educated on  [facility] process.  Extra time needed to correct form and execution of exercise. AMRAP  UE/LE 1 min each (As Many Repetitions as Possible) 2 rounds   Bridge  60 sec RT Side lying hip adduction  30 sec followed by side lying hip abduction  LT Side lying hip adduction 30 sec followed by side lying hip abduction UE ROW RT 30 sec, LT 30 sec using 15 # wt   AMRAP LE 1 min each   2 rounds Lunge while holding back of chair (modified with cushion and 1/2 range as needed) Sit to stand with weight  15 # KB Lateral step ups  with hip hike 30 sec  on RT and LT Table top position with Both legs and then horizontal abduction with theraband Self Care  Educated on community wellness and you tube modified cardio/strengthening exercises Educated on Pt hip weakness  [facility] Adult PT Treatment:                                                DATE: 12-17-22 EMOM and AMRAP Therapeutic Exercise: 3 way soleus sitting with 15 # Worked on lunge technique Step over yoga block LT to RT and then RT to LT 3 x 10 Goblet squat to chair with pause 2 x 10  25 #  AMRAP  UE/LE 2 min each 3 rounds With VC and TC for correct execution Bench press   [employer] climber Side lying hip abduction  [facility] Adult PT Treatment:                                                DATE: 12-15-22  Therapeutic  Activity: Floor transfer education and demonstration with various techniques from getting on floor using  mat and chair.  Pt requiring min assist to get on floor and mod assist to rise from ground Practicing components of transfer with lunging and various ranges until comfortable for tall kneeling and 1/2 kneeling.   Self Care: Educated on progressive strengthening and consistent walking program to build endurance.  Pt with 6 MWT decreased due to pt with lapse of consistent participation due to husband needs.   Devised plan of journaling daily activity and exercise and showed how to utilize I phone to count steps  [facility] Adult PT Treatment:                                                DATE: 12-01-22 ROM measurements Therapeutic Exercise: Step over yoga block LT to RT and then RT to LT 3 x 10 with 2 Lb weight on LT Modified mountain climber with UE on elevated mat 2 x 10 Goblet squat to chair with pause 2 x 10 30 # Lunge 1/2 way with UE support 10 x on RT and LT each Heel raise BIL SL heel raise 1 x 10 on LT 1/2 range Eversion with GTB  2 x 10 Woodpecker anterior  LT LE SL3 x 10 Manual Therapy: LT great toe distraction and Toe flex/ext Put heel lift in shoe Therapeutic Activity:  Pt attempted to get to ground but limited by limited flexibility in knees etc.  Began lunges today and will continue to progress as pt is able for fall preparedness.   [facility] Adult PT Treatment:                                                DATE: 11-24-22 Therapeutic Exercise: Step over yoga block LT to RT and then RT to LT 3 x 10 Goblet squat to chair with pause 2 x 10  25 # Reverse Clam LT sidelying 1 x 10 PTT with GTB sitting with figure 4 2 x 10 Eversion with GTB  2 x 10 Neuromuscular re-ed: Gait training with mirror with verbal and visual cues for proper form/ technique performing multiple times to promote intrinsic feedback and practice walking inside on level surface.  In front of mobile mirror  [facility] Adult  PT Treatment:                                                DATE: 11-12-22 Therapeutic Exercise: Woodpecker anterior  LT LE SL3 x 10 Woodpecker lateral LT LE SL 3 x 10 Woodpecker lateral LT LE SL 3 x 10  Neuromuscular re-ed: Gait training in // with arch tape and verbal and visual cues for proper form/ technique performing multiple times to promote intrinsic feedback and practice walking outside of the //.  In front of mobile mirror Manual Therapy: Arch taping on the L to promote ankle support    [facility] Adult PT Treatment:  DATE: 11/05/22 Therapeutic Exercise: Standing arch building exercise in //  Neuromuscular re-ed: Gait training in // with arch tape and verbal and visual cues for proper form/ technique performing multiple times to promote intrinsic feedback and practice walking outside of the //.  Manual Therapy: Arch taping on the L to promote ankle support  Therapeutic Activity: 6 min walk increased to 966 ft 5 x STS 9 seconds   [facility] Adult PT Treatment:                                                DATE: 11/02/2021 Therapeutic Exercise: Revers clam shell 2 x 10 Clamshell 2 x 15 Manual Therapy: MTRP along the R glute med  Tack and stretch of the R glute med LAD RLE grade IV with oscillations LLE talocrural LAD grade III Therapeutic Activity: Reviewed MMT results and impact on ADLs and benefit of strengthening. Additionally utilizing 1 rest day a week to allow for muscle recovery and continue to further strength.   [facility] Adult PT Treatment:                                                DATE: 10/29/2021 Therapeutic Exercise: Seated hip ER with RTB 3 x 10 Ankle posterior tib strengthening 3 x10  Manual Therapy: IASTM along the gastroc/ soleus and peroneals Sub-talar mobs grade III in prone for supination/ pronation, talocural LAD grad III Edema reduction massage in prone  Trigger Point Dry-Needling  Treatment instructions:  Expect mild to moderate muscle soreness. S/S of pneumothorax if dry needled over a lung field, and to seek immediate medical attention should they occur. Patient verbalized understanding of these instructions and education.  Patient Consent Given: Yes Education handout provided: Previously provided Muscles treated: peroneal longus/ brevis, gastro Electrical stimulation performed: No Parameters: N/A Treatment response/outcome: twitch response, and muscle lengthening.   Neuromuscular re-ed: Gait training with trekking poles 2 x 180 ft, 2nd set focused on redirection of pt's attention which eased her apprehension of falling.     PATIENT EDUCATION: updated 10/29/2022 Education details: for Edema reduction her husband could use lotion and perform the technique on her ankle while she is in prone. Discussed compression stocking to get something taut but comfortable(otherwise too tight may be counter productive) and could trial using ace wraps to see how she does with it before purchasing compression stockings. Added AMRAP exercise for endurance strength program Person educated: Patient Education method: Explanation Education comprehension: verbalized understanding and needs further education   HOME EXERCISE PROGRAM:  Access Code: URL: [email or web address] Date: 12/01/2022 Prepared by: Garen Lah  Program Notes Toe Yoga as shown in clinic Dorsiflexion stretch on footstool with power band as shown in clinic.  Buy on [employer] with various strengths  Exercises - Seated Toe Raise  - 1 x daily - 7 x weekly - 2 sets - 10 reps - Seated Calf Stretch with Strap  - 1 x daily - 7 x weekly - 2 sets - 3 reps - 30 hold - Standing 3-Way Leg Reach with Resistance at Ankles and Counter Support  - 1 x daily - 7 x weekly - 3 sets - 10 reps - Sit to stand with sink support Movement snack  -  1 x daily - 7 x weekly - 3 sets - 10 reps - Single Leg Stance with Support  - 2 x  daily - 7 x weekly - 1 sets - 3-5 reps - hold 15 sec hold - Heel raise with counter support and towel under toes  - 1 x daily - 7 x weekly - 3 sets - 10 reps - Seated Figure 4 Ankle Inversion with Resistance  - 1 x daily - 7 x weekly - 3 sets - 10 reps - Seated Ankle Eversion with Resistance  - 1 x daily - 7 x weekly - 3 sets - 10 reps - Hip Hiking on Step  - 1 x daily - 7 x weekly - 3 sets - 10 reps - Sidelying Hip Abduction  - 1 x daily - 7 x weekly - 3 sets - 12 reps - Woodpecker one leg anterior, lateral and medial  - 1 x daily - 7 x weekly - 6 sets - 10 reps - Sidelying Reverse Clamshell with Resistance  - 1 x daily - 7 x weekly - 3 sets - 10 reps - Clamshell with Resistance  - 1 x daily - 7 x weekly - 3 sets - 10 reps - Goblet Squat with Kettlebell  - 1 x daily - 7 x weekly - 3 sets - 10 reps - Lunge with Counter Support  - 1 x daily - 7 x weekly - 3 sets - 10 reps - [employer] on Counter  - 1 x daily - 7 x weekly - 3 sets - 10 reps - Single Leg Heel Raise with Counter Support  - 1 x daily - 7 x weekly - 3 sets - 10 reps AMRAP  UE/LE 1 min each (As Many Repetitions as Possible) Can do 2-3 rounds   Bridge  60 sec RT Side lying hip adduction  30 sec followed by side lying hip abduction  LT Side lying hip adduction 30 sec followed by side lying hip abduction UE ROW RT 30 sec, LT 30 sec using 15 # wt   AMRAP LE 1 min each   Can do 2-3 rounds Lunge while holding back of chair (modified with cushion and 1/2 range as needed) Sit to stand with weight  15 # KB Lateral step ups  with hip hike 30 sec  on RT and LT Table top position with Both legs and then horizontal abduction with theraban Access Code: [email or web address] URL: [email or web address] Date: 12/17/2022 Prepared by: Garen Lah  Program Notes Do this as an AMRAP (as many repetitions as possible)Do for one minute increments.  Hip abduction  30 sec on each side.  Do 3 to 4 rounds   AMRAP Exercises - Bench press  for a minute  - 1 x daily - 7 x weekly - 3 sets - 10 reps - Bridge for 1 minute  - 1 x daily - 7 x weekly - 3 sets - 10 reps - sidelying hip abduction for 30 seconds  - 1 x daily - 7 x weekly - 3 sets - 10 reps - [employer] for one minute  - 1 x daily - 7 x weekly - 3 sets - 10 reps ASSESSMENT:   CLINICAL IMPRESSION: Ms Milillo arrives to clinic with 0/10 pain in left ankle.  Pt enjoys AMRAP routines and working on posterior chain and proximal hip weakness. Pt is now going to personal trainer and will be transitioning to personal trainer post DC.  Time today spent going  over strength, flexibility, balance and how to prioritize weekly.  HEP added a new AMRAP superset of exercises emphasizing posterior chain.  Pt ambulates  with  significant trendelenberg gait due to glut me weakness.    Pt will continue for  reinforcement and repetition in order to be able to carryover care at home independently.  Will continue to work toward completion of goals in next 2-3 visits to end before 01-26-23.     OBJECTIVE IMPAIRMENTS: Abnormal gait, decreased balance, difficulty walking, decreased ROM, decreased strength, hypomobility, obesity, and pain.    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    REHAB POTENTIAL: Good   CLINICAL DECISION MAKING: Evolving/moderate complexity   EVALUATION COMPLEXITY: Moderate     GOALS: Goals reviewed with patient? No   SHORT TERM GOALS: Target date: 10-29-21 Pt will be independent with intial HEP Baseline:no knowledge,  10-07-22 initial HEP given  Goal status: MET   2.  Pt will be able perform STS with even wt  distribution in bil LE Baseline: Pt wt bears to RT in STS Goal status: MET   3.  Pt pain with functional activities in Great toe will be reduced to 5/10  Baseline: 9/10 10-15-22  3/10 Goal status: MET     LONG TERM GOALS: Target date: 11-26-22 revised to 01-26-23   Pt will be independent with advanced HEP including AMRAP/EMOM supersets for building strength and endurance (POC 01-26-23 Baseline: Pt needs reinforcement of HEP for proper technique and execution Goal status: ONGOING 01-26-23 Revised   2.  FOTO will improve from  51%  to   64%  indicating improved functional mobility.  Baseline: eval 51% 10-20-22 73%  65% 12-15-22 Goal status: MET   3.  Pain will decrease to 3/10 with all functional activities from 9/10 Baseline: Pt max pain after functional activities. 4-5/10 05-28-23 Status: continues to have pain Goal status: ONGOING 01-26-23   4.  Pt will be able to perform within normal range for age Baseline: 824 ft 1290-1755 ft  12-15-22 921ft Goal status:ONGOING 12-15-22   5.  Pt will be able to perform balance with SLS for at least 15 sec on RT and LT to show improved balance/strength in SL activities Baseline: RT 23 sec  LT 10 sec tries to use UE 10-20-22 < 3 sec BIL today Status: 12-15-22  LT 3 sec and RT 4 sec 06-15-23 RT 6 sec, LT , 3 sec Goal status: Revised for 01-26-23    6.  Pt will be able to perform floor to stand transfer to show  increased strength and safe fall preparedness  Baseline: unable to perform Status: unable to perform 12-15-22  Pt needs min assist to rise and VC for LE positioning Goal status: ONGOING 01-26-23    7. Demonstrate 4 + /5 LT LE strength to improve stability, safety and endurance of community level function            Baseline: 4-/5 10-20-22  See flow chart See flow chart  hip abd 3+-4-/5            Goal status: ONGOING 01-26-23  8.  Ms Huffmaster  will show improvement in walking steps from 2500 steps a day to at least 5000  steps a day or more Baseling 2500  steps a day Goal Statues INITIAL 01-26-23            PLAN:   PT FREQUENCY: 1-2x/week   PT DURATION: 8 weeks   PLANNED INTERVENTIONS: Therapeutic exercises, Therapeutic activity, Neuromuscular re-education, Balance training, Gait training, Patient/Family education, Self Care, Joint mobilization, Stair training, Dry Needling, Electrical stimulation, Cryotherapy, Moist heat, Taping, Ionotophoresis 4mg /ml Dexamethasone, Manual therapy, and Re-evaluation   PLAN FOR NEXT SESSION: TPDN, Manual, Response to edema reduction.  Gait training, how was arch taping. R glute med strengthening.   May add prone quad stretch to HEP   Work on Deadlift form and add posterior chain exercises to [employer] Working on transitioning to a Systems analyst for continued strengthening.   Garen Lah, PT, ATRIC Certified Exercise Expert for the Aging Adult  01/07/23 12:19 PM Phone: [phone] Fax: [phone]

## 2023-01-07 ENCOUNTER — Ambulatory Visit: Payer: Medicare Other | Admitting: Physical Therapy

## 2023-01-07 ENCOUNTER — Encounter: Payer: Self-pay | Admitting: Physical Therapy

## 2023-01-07 DIAGNOSIS — R262 Difficulty in walking, not elsewhere classified: Secondary | ICD-10-CM

## 2023-01-07 DIAGNOSIS — M25672 Stiffness of left ankle, not elsewhere classified: Secondary | ICD-10-CM

## 2023-01-07 DIAGNOSIS — M79675 Pain in left toe(s): Secondary | ICD-10-CM

## 2023-01-07 NOTE — Patient Instructions (Addendum)
AMRAP  UE/LE( Upper Extremity /Lower Extremity) 1 min each (As Many Repetitions as Possible) 2 rounds   Bridge  60 sec RT Side lying hip adduction  30 sec followed by side lying hip abduction  LT Side lying hip adduction 30 sec followed by side lying hip abduction Upper extremity row (hand in chair seat and pull up with other arm in a  ROW RT 30 sec, LT 30 sec using 15 # wt   AMRAP LE 1 min each   2 rounds Lunge while holding back of chair (modified with cushion and 1/2 range as needed) Sit to stand with weight  15 # KB Lateral step ups  with hip hike 30 sec  on RT and LT Table top position with Both legs and then horizontal abduction with theraband   AMRAP LE (Lower Extremity)   1. Standing at counter with Theraband around ankles  Hip abduction ( lead with heel of foot) ( hold for 3 sec)  Doing things slowly reveals weaknes)  2. Standing at counter with Theraband around ankles  Hip extension ( hold for 3 sec)  Doing things slowly reveals weaknes)  3. Heel raise bilaterally ( progress to single leg balance for challenge)  4.Monster walks along counter( Sit down into it before you side step)   Every Sunday fill out a schedule for your week:  Elizabeth Wang!!!! Fill in your weekly activities and appointments to help determine which days would be best for more exercises (prioritize your own health) Determine days and time you can walk/ or stationary bike/ aquatics (Some form of consistent movement Daily do your flexibility exercises (stretching- worlds greatest stretch or full squat) right after you get out of bed in the morning.  The ones that you use to make your back/ leg feel better.    Fill in your balance days, at least 2-3 days of balance for at least 10-15 minutes - Single leg balance while brushing teeth   Fill in your strengthening days, 2-3 times per week of 6-8 exercises (can make this less to start) Like the AMRAP(As many repetitions as possible)   You can use your phone and  download the -free interval timer   Why you need to progress exercises:   Progression will keep you challenged and continue to improve your pain, balance, and strength   If you keep going the same exercises in the same amounts you won't be getting any stronger! You can change exercises by changing temp( going slower) or adding weights   You know you can progress when the exercise feels 1-3/10. You want to stay between a 3-6/10 difficulty.  Walking Progression/ May use aquatics to accommodate painful toe   Walk increasingly longer duration (try 3 sets of 15 minutes or 2 sets of 15 minutes) until you can walk about 45 minutes without stopping or cycle for 45 min without stopping    Progress to Fill in your walking days to total 150 minutes/week  of moderate intensity walking (can be over 5 days) can walk 30 min x 5 days or 50 min 3 days    Walk outside to challenge your balance on uneven ground   Walk and talk with someone to challenge your balance/walking abilities   Walk faster and with more difficulty/ in the water  jog and talk with Elizabeth Wang   Looking for 4-6/10 difficulty  Balance Progression   Initially start with 15 minutes of pure balance work, you want to make sure you get  unsteady a few times on the balance exercises so that you learn how to balance   Static Balance: balance while in the same position ? Change base of support: narrow, tandem, 1 leg, one foot in front ? Change upper body support: go from 2 hands to 1 hand to fingertips to no hands ? Eyes Open/Closed ? Change the surface: balance pad, shoes off, carpet   Dynamic Balance: balance while in motion ? Change upper body support ? Add Objects/Weight: basketball, balls, dumbbell ? Move more/bigger: walk along the counter, get on/off the floor, take bigger steps, take higher steps ? Change Surfaces: Balance pad, carpet ? Eyes Open/Closed ? Add a cognitive task: naming animals/holidays, counting by 3's   Examples: ? Static: standing on  one leg with decreasing upper body support or eyes closed ? Dynamic: jumping, progress by holding a basketball  Strength Progression   Recommended to do 2-3 days per week with about 8-10 exercises   Progress by: ? Adding more weight ? Add more repetitions (the last three repetitions should feel difficult) ? Add tempo, for example in the mini squat: go down slowly, stand up quickly, hold in the bottom   Structure of exercises: ? 3 rounds of exercise x, y, z with 1 minute rest between rounds ? [place]: :2 on/:10 off x how every many rounds you want to do ? EMOM (Every minute on the minute): Ex.. 1 min chair squats, 1 min step ups, 1 min lunges,1 min overhead power, 1 min rest. X 4  Guidelines for Pain   You can exercise with pain! Pain is not harm.   Pick exercises that feel good, help to reduce your pain, or have no pain at all ? If an exercise increases pain, stop and evaluate if your pain has remained worse  ? If so, stop that exercise for a few days ? Pain should be no more than a 3/10 or increase over 2 points.   Use ice/heat as needed to manage pain prior or after exercising.      Elizabeth Wang, PT, [name] Certified Exercise Expert for the Aging Adult  01/07/23 11:17 AM Phone: [phone] Fax: [phone]

## 2023-01-11 ENCOUNTER — Encounter: Payer: Self-pay | Admitting: Podiatry

## 2023-01-11 ENCOUNTER — Ambulatory Visit: Payer: Medicare Other | Admitting: Physical Therapy

## 2023-01-11 ENCOUNTER — Encounter: Payer: Self-pay | Admitting: Physical Therapy

## 2023-01-11 ENCOUNTER — Telehealth: Payer: Self-pay | Admitting: Cardiology

## 2023-01-11 ENCOUNTER — Ambulatory Visit (INDEPENDENT_AMBULATORY_CARE_PROVIDER_SITE_OTHER): Payer: Medicare Other | Admitting: Podiatry

## 2023-01-11 DIAGNOSIS — M79675 Pain in left toe(s): Secondary | ICD-10-CM | POA: Diagnosis not present

## 2023-01-11 DIAGNOSIS — R262 Difficulty in walking, not elsewhere classified: Secondary | ICD-10-CM | POA: Diagnosis not present

## 2023-01-11 DIAGNOSIS — M25672 Stiffness of left ankle, not elsewhere classified: Secondary | ICD-10-CM | POA: Diagnosis not present

## 2023-01-11 DIAGNOSIS — G629 Polyneuropathy, unspecified: Secondary | ICD-10-CM

## 2023-01-11 DIAGNOSIS — L600 Ingrowing nail: Secondary | ICD-10-CM

## 2023-01-11 DIAGNOSIS — L97521 Non-pressure chronic ulcer of other part of left foot limited to breakdown of skin: Secondary | ICD-10-CM | POA: Diagnosis not present

## 2023-01-11 NOTE — Progress Notes (Signed)
Subjective:  Patient ID: Elizabeth Wang, female    DOB: 05-22-1946,   MRN: [id number]  Chief Complaint  Patient presents with   Foot Ulcer    Left great toe ulcer     77 y.o. female presents for follow-up of left hallux wound. Relates it was doing better has been dressing it and has been in PT to help her gait and prevent rubbing in this area on her toe. Continue PT.   Relates the third toe has been quite painful for her.  Denies any other pedal complaints. Denies n/v/f/c.   Past Medical History:  Diagnosis Date   Acute pulmonary embolism ([place]) 06/2013   Arthritis    Asthma    years ago    Clotting disorder (HCC)    DVT/PE   Diverticulosis    minor    Dyslipidemia    Esophageal reflux    Hypertension    Hypothyroidism    Numbness    TOES   Obesity    OSA (obstructive sleep apnea)    severe OSA with an AHI of 51/hr and nocturnal hypoxemia with O2 sats at low as 69%.   Paroxysmal atrial fibrillation (HCC)    1 isolated episode in the setting of acute stress with no further episodes.    Pseudogout     Objective:  Physical Exam: Vascular: DP/PT pulses 2/4 bilateral. CFT <3 seconds. Normal hair growth on digits. No edema.  Skin. No lacerations or abrasions bilateral feet. Hyperkeratotic lesion noted to lateral third digit around nail plate and some incurvation of right third digit nail. Left hallux ulcer reopened measures 0.3 cm x 0.2 cm x 0.1 cm   No erythema edema or purulence noted.  Musculoskeletal: MMT 5/5 bilateral lower extremities in DF, PF, Inversion and Eversion. Deceased ROM in DF of ankle joint. Hallux malleus noted.  Neurological: Sensation intact to light touch.   Assessment:   1. Skin ulcer of left great toe, limited to breakdown of skin ([place])   2. Neuropathy   3. Ingrown nail of third toe of right foot          Plan:  Patient was evaluated and treated and all questions answered. Ulcer left hallux wound limited to breakdown of skin  -Ulcer debrided  procedure below.   -Dressed with offloading pads.  -Off-loading with aperture pads. -Discussed avoiding any pedicures and staying off of this foot as much as possible.  -Discussed glucose control and proper protein-rich diet.  -Discussed if any worsening redness, pain, fever or chills to call or may need to report to the emergency room. Patient expressed understanding.  -Discussed possible surgery to straighten hallux hammertoe to prevent future ulceration. Did discuss she is in PT now to work on ankle ROM and gait and we will see if this improves the ulceration.   Next visit discussed ingrown nail procedure as nail continues to be bothersome.   Procedure: Excisional Debridement of Wound Rationale: Removal of non-viable soft tissue from the wound to promote healing.  Anesthesia: none Pre-Debridement Wound Measurements: Overlying callus  Post-Debridement Wound Measurements: 0.3 cm x 0.2 cm x 0.1 cm  Type of Debridement: Sharp Excisional Tissue Removed: Non-viable soft tissue Depth of Debridement: subcutaneous tissue. Technique: Sharp excisional debridement to bleeding, viable wound base.  Dressing: Dry, sterile, compression dressing. Disposition: Patient tolerated procedure well. Patient to return in 2 week for follow-up.  Return in about 2 weeks (around 01/25/2023) for wound check.       Return in about 2  weeks (around 01/25/2023) for wound check.   Lorenda Peck, DPM

## 2023-01-11 NOTE — Therapy (Signed)
OUTPATIENT PHYSICAL THERAPY TREATMENT NOTE    Patient Name: Elizabeth Wang MRN: [id number] DOB:1946/02/19, 77 y.o., female Today's Date: 01/11/2023  PCP: Ginger Organ. 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   Paroxysmal atrial fibrillation ([facility])    1 isolated episode in the setting of acute stress with no further episodes.    Pseudogout    Past Surgical History:  Procedure Laterality Date   BREAST RECONSTRUCTION     CARDIOVERSION N/A 06/27/2020   Procedure: CARDIOVERSION;  Surgeon: [name], MD;  Location: [facility];  Service: Cardiovascular;  Laterality: N/A;   CESAREAN SECTION     COLONOSCOPY  2004   repeat in ten years   KNEE ARTHROSCOPY Left    TONSILLECTOMY     TOTAL KNEE ARTHROPLASTY Right 01/01/2014   Procedure: TOTAL RIGHT KNEE ARTHROPLASTY;  Surgeon: [name], MD;  Location: [facility];  Service: Orthopedics;  Laterality: Right;   TOTAL KNEE ARTHROPLASTY Left 07/23/2014   Procedure: LEFT TOTAL KNEE ARTHROPLASTY;  Surgeon: [name], MD;  Location: [facility];  Service: Orthopedics;  Laterality: Left;    tummy tuck  1987   WISDOM TOOTH EXTRACTION     Patient Active Problem List   Diagnosis Date Noted   Closed fracture of proximal phalanx of lesser toe of right foot 09/01/2022   Osteoarthritis of talonavicular joint 09/01/2022   Closed fracture of fifth metatarsal bone 09/05/2021   Contusion of left knee 04/05/2019   Pain in left knee 04/03/2019   Osteoarthritis of right glenohumeral joint 11/24/2017   Pain of left great toe 12/17/2016   Skin ulcer of left great toe, limited to breakdown of skin ([place]) 12/17/2016   Gout 01/12/2014   S/P total knee arthroplasty 01/11/2014   Hypokalemia 01/03/2014   Hyponatremia 01/03/2014   Postoperative anemia due to acute blood loss 01/02/2014   Hypothyroidism 01/02/2014   Other and unspecified hyperlipidemia 01/02/2014   OA (osteoarthritis) of knee 01/01/2014   Essential hypertension, benign 11/28/2013   Pulmonary embolism ([place]) 11/28/2013   DVT (deep venous thrombosis) ([place]) 08/09/2013   Other pulmonary embolism and infarction 08/09/2013   Persistent atrial fibrillation ([place]) 07/14/2013    REFERRING DIAG: M25.572 (ICD-10-CM) - Pain in left  ankle and joints of left foot    THERAPY DIAG:  Difficulty in walking, not elsewhere classified  Stiffness of left ankle, not elsewhere classified  Rationale for Evaluation and Treatment Rehabilitation  PERTINENT HISTORY: 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 Yes: NPRS scale: 0/10 Pain location: great toe LT,  unable to descend 1st ray Pain description: dull ache Aggravating factors: walking on it when I walk > 7000 steps,  just living,  sometimes just throbbing, steps Relieving factors: wearing a toe pad, salonpas topical No pain in my left ankle, uncomfortable because it does not bend   OBJECTIVE: (objective measures completed at initial evaluation unless otherwise dated)   DIAGNOSTIC FINDINGS: x rays recently taken but not available at [place]:  FOTO 51%  predicted 64% 10-20-22 73% 11/02/2022 49% 12-15-22 65%   COGNITION: Overall cognitive status: Within functional limits for tasks assessed                         SENSATION: WFL   EDEMA:  Figure 8: RT 54 cm, LT 61 cm 10-15-22 10:30 AM  LT 59.5 cm 12-15-22  RT 54, LT  59 cm   MUSCLE LENGTH: Hamstrings: Right 53 deg; Left 50 deg  10-15-22 LLD 31 in ASIS to malleolus LT  RT 31.75 inch     POSTURE: rounded shoulders, forward head, flexed trunk , and obesity   PALPATION: Not pain but increased stiffness / blocked talar glide   LOWER EXTREMITY ROM:   Active ROM Right eval Left eval LT 10-15-22 10-20-22 LT 12-01-22 01-07-23  Hip flexion          Hip extension          Hip abduction          Hip adduction          Hip internal rotation          Hip external rotation          Knee flexion  Knee extension          Ankle dorsiflexion 5 +9 from neutral 0 0 0 0  Ankle plantarflexion 65 51  55 60 60  Ankle inversion 27 14  25 25 25   Ankle eversion 20 14  15 29 30   Great toe distal flexion 50 25  39 65 65  Great toe  extension 45 10  10 10 12    (Blank rows = not tested)   LOWER EXTREMITY MMT:   MMT Right eval Left eval RT/LT 10-20-22 RT/ LT 11/02/2022 RT/LT 12-15-22 RT/LT   Hip flexion 4+ 4 4+/4 4+/4+ 4+/4+   Hip extension 4 4- 4/4 4/4 4/4   Hip abduction 4- 4- 4-/4- 4-/4- 4-/3+   Hip adduction          Hip  internal rotation          Hip external rotation          Knee flexion          Knee extension 5 4 5/4+  5/4+   Ankle dorsiflexion 5 4 5/4- 5/4+ 5/4-   Ankle plantarflexion       RT/LT  15/25 and LT 9/25   Ankle inversion 4+ 4- 4+/4 5/ 4+ 5/4+   Ankle eversion 4+ 4- 4+/4 5/4+ 5/4+    (Blank rows = not tested)       FUNCTIONAL TESTS:  5 times sit to stand: 11.15 6 MWT 12-21-23824 ft 1290-1755 ft            Sustained bridge  32 seconds             SLS RT 23 sec  LT 10sec but pain   Asterisk Signs Eval (12-7- 2023) 10-15-22  10-20-22  11/05/2022  11-24-22  12-01-22  12-15-22 01-07-23  5 x  STS 11.73 sec but wt bears to RT 7.66 sec wt bears to RT   9.66 sec with equal wt bear  9 secs     8.64 sec   Max pain LT great toe 9/10 3/10  3/10 most of the time but occassionally 9/10 as in today   1-2/10 in ankle and toe  65 PIP GREAT toe LT  65 PIP GREAT toe LT   Max pain 5/10 65 Great toe LT extenson Max pain 9/10   6MWT   824 ft 1290-1755 ft NT   966 ft     919 ft feeling fatigued  HR 115    sustained bridge  32 sec    Cramping in hamstring NT        85 sec  but decreaseavailable range    SLS RT 23/LT 10 sec  RT 23 LT 7 sec  RT < 3  LT < 3 checked with and without shoes       RT < 6 LT < 3 checked with and without shoes   DF LT  DF 0 LT DF- 0 LT   DF 24ft    Floor to stand x fer         Unable to perform   Hip abduction       4-/4- LT/RT       GAIT: Distance walked: 150  Status 12-15-22  919 ft fatigued Assistive device utilized: None Level of assistance: Complete Independence Comments: antalgic gait left, with trunk lean to RT Pt with hammer toes and ulcer on LT great toe healing , with an extension pad.  Pt is unable to depress 1st ray with  gait in stance/ push off phase, Pt tends to toe grip with shoes and is wearing more sneakers rather than sandals presently.    TODAY'S TREATMENT:  [facility] Treatment:                                                DATE: 01/11/2023 Todays  session performed in parallel bars  Therapeutic Exercise: AMRAP LE (Lower Extremity)  2 rounds Hip abduction with GTB with 5 sec end range oscillations Hip extension  With GTB with 5 sec end range oscillations SL heel raise RT  SL heel raise LT Heel raise bilaterally off edge of 2 inch step Monster walks Forward/ BWD in //  with GTB Squat with heel raise at standing while holding black weighted ball touching target on wall for consistency/ height (performed by //) Sit to stand from chair with one leg advanced by 4 inches (performed on round 2)  Neuromuscular re-ed: 2 rounds -1 performed at beginning of session, 2nd performed after 2 rounds of There ex. [place] on airex pad with bil UE holding red ball ABCs x 1 Modified tandem ABCs holding red physioball x 2 (alternating lead leg on set 2)   [facility] Adult PT Treatment:                                                DATE: 01-07-23 Therapeutic Exercise: AMRAP LE (Lower Extremity)  4 rounds  Hip abduction with GTB VC and TC standing at counter  hold for 3 sec Hip extension  With GTB ( hold for 3 sec)   Heel raise bilaterally  SL heel raise RT  SL heel raise LT Monster walks along counter VC for technique  with GTB STS with 15 pound DB 3 x 10 in each hand SELF CARE Went over how to progress exercises for flexibility, balance, endurance and strength on 2 page handout and answered questions,    [facility] Adult PT Treatment:                                                DATE: 12-31-22 Therapeutic Exercise Educated on  [facility] process.  Extra time needed to correct form and execution of exercise. AMRAP  UE/LE 1 min each (As Many Repetitions as Possible) 2 rounds   Bridge  60 sec RT Side lying hip adduction  30 sec followed by side lying hip abduction  LT Side lying hip adduction 30 sec followed by side lying hip abduction UE ROW RT 30 sec, LT 30 sec using 15 # wt   AMRAP LE 1 min each   2 rounds Lunge while holding back of chair (modified  with cushion and 1/2 range as needed) Sit to stand with weight  15 # KB Lateral step ups  with hip hike 30 sec  on RT and LT Table top position with Both legs and then horizontal abduction with theraband Self Care  Educated on community wellness and you tube modified cardio/strengthening exercises Educated on Pt hip weakness   PATIENT EDUCATION: updated 10/29/2022 Education details: for Edema reduction her  husband could use lotion and perform the technique on her ankle while she is in prone. Discussed compression stocking to get something taut but comfortable(otherwise too tight may be counter productive) and could trial using ace wraps to see how she does with it before purchasing compression stockings. Added AMRAP exercise for endurance strength program Person educated: Patient Education method: Explanation Education comprehension: verbalized understanding and needs further education   HOME EXERCISE PROGRAM:  Access Code: [email or web address] URL: [email or web address] Date: 12/01/2022 Prepared by: [name]  Program Notes Toe Yoga as shown in clinic Dorsiflexion stretch on footstool with power band as shown in clinic.  Buy on [employer] with various strengths  Exercises - Seated Toe Raise  - 1 x daily - 7 x weekly - 2 sets - 10 reps - Seated Calf Stretch with Strap  - 1 x daily - 7 x weekly - 2 sets - 3 reps - 30 hold - Standing 3-Way Leg Reach with Resistance at Ankles and Counter Support  - 1 x daily - 7 x weekly - 3 sets - 10 reps - Sit to stand with sink support Movement snack  - 1 x daily - 7 x weekly - 3 sets - 10 reps - Single Leg Stance with Support  - 2 x daily - 7 x weekly - 1 sets - 3-5 reps - hold 15 sec hold - Heel raise with counter support and towel under toes  - 1 x daily - 7 x weekly - 3 sets - 10 reps - Seated Figure 4 Ankle Inversion with Resistance  - 1 x daily - 7 x weekly - 3 sets - 10 reps - Seated Ankle Eversion with Resistance  - 1 x daily -  7 x weekly - 3 sets - 10 reps - Hip Hiking on Step  - 1 x daily - 7 x weekly - 3 sets - 10 reps - Sidelying Hip Abduction  - 1 x daily - 7 x weekly - 3 sets - 12 reps - Woodpecker one leg anterior, lateral and medial  - 1 x daily - 7 x weekly - 6 sets - 10 reps - Sidelying Reverse Clamshell with Resistance  - 1 x daily - 7 x weekly - 3 sets - 10 reps - Clamshell with Resistance  - 1 x daily - 7 x weekly - 3 sets - 10 reps - Goblet Squat with Kettlebell  - 1 x daily - 7 x weekly - 3 sets - 10 reps - Lunge with Counter Support  - 1 x daily - 7 x weekly - 3 sets - 10 reps - [employer] on Counter  - 1 x daily - 7 x weekly - 3 sets - 10 reps - Single Leg Heel Raise with Counter Support  - 1 x daily - 7 x weekly - 3 sets - 10 reps AMRAP  UE/LE 1 min each (As Many Repetitions as Possible) Can do 2-3 rounds   Bridge  60 sec RT Side lying hip adduction  30 sec followed by side lying hip abduction  LT Side lying hip adduction 30 sec followed by side lying hip abduction UE ROW RT 30 sec, LT 30 sec using 15 # wt   AMRAP LE 1 min each   Can do 2-3 rounds Lunge while holding back of chair (modified with cushion and 1/2 range as needed) Sit to stand with weight  15 # KB Lateral step ups  with hip hike 30 sec  on RT  and LT Table top position with Both legs and then horizontal abduction with theraban Access Code: [email or web address] URL: [email or web address] Date: 12/17/2022 Prepared by: [name]  Program Notes Do this as an AMRAP (as many repetitions as possible)Do for one minute increments.  Hip abduction  30 sec on each side.  Do 3 to 4 rounds   AMRAP Exercises - Bench press for a minute  - 1 x daily - 7 x weekly - 3 sets - 10 reps - Bridge for 1 minute  - 1 x daily - 7 x weekly - 3 sets - 10 reps - sidelying hip abduction for 30 seconds  - 1 x daily - 7 x weekly - 3 sets - 10 reps - [employer] for one minute  - 1 x daily - 7 x weekly - 3 sets - 10 reps ASSESSMENT:    CLINICAL IMPRESSION: Elizabeth Wang arrived to session reporting no pain despite seeing the podiatrist earlier today. Continued working on LE strengthening as well as balance continuing with the AMRAP which she did well with. Performed balance before and after the reps which she was noticeably fatigued after the 2nd round demonstrating increased postural sway.      OBJECTIVE IMPAIRMENTS: Abnormal gait, decreased balance, difficulty walking, decreased ROM, decreased strength, hypomobility, obesity, and pain.    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    REHAB POTENTIAL: Good   CLINICAL DECISION MAKING: Evolving/moderate complexity   EVALUATION COMPLEXITY: Moderate     GOALS: Goals reviewed with patient? No   SHORT TERM GOALS: Target date: 10-29-21 Pt will be independent with intial HEP Baseline:no knowledge,  10-07-22 initial HEP given  Goal status: MET   2.  Pt will be able perform STS with even wt distribution in bil LE Baseline: Pt wt bears to RT in STS Goal status: MET   3.  Pt pain with functional activities in Great toe will be reduced to 5/10  Baseline: 9/10 10-15-22  3/10 Goal status: MET     LONG TERM GOALS: Target date: 11-26-22 revised to 01-26-23   Pt will be independent with advanced HEP including AMRAP/EMOM supersets for building strength and endurance (POC 01-26-23 Baseline: Pt needs reinforcement of HEP for proper technique and execution Goal status: ONGOING 01-26-23 Revised   2.  FOTO will improve from  51%  to   64%  indicating improved functional mobility.  Baseline: eval 51% 10-20-22 73%  65% 12-15-22 Goal  status: MET   3.  Pain will decrease to 3/10 with all functional activities from 9/10 Baseline: Pt max pain after functional activities. 4-5/10 05-28-23 Status: continues to have pain Goal status: ONGOING 01-26-23   4.  Pt will be able to perform 6MWT within normal range for age Baseline: [place] ft 1290-1755 ft  12-15-22 987ft Goal status:ONGOING 12-15-22   5.  Pt will be able to perform balance with SLS for at least 15 sec on RT and LT to show improved balance/strength in SL activities Baseline: RT 23 sec  LT 10 sec tries to use UE 10-20-22 < 3 sec BIL today Status: 12-15-22  LT 3 sec and RT 4 sec 06-15-23 RT 6 sec, LT , 3 sec Goal status: Revised for 01-26-23  6.  Pt will be able to perform floor to stand transfer to show  increased strength and safe fall preparedness  Baseline: unable to perform Status: unable to perform 12-15-22  Pt needs min assist to rise and VC for LE positioning Goal status: ONGOING 01-26-23    7. Demonstrate 4 + /5 LT LE strength to improve stability, safety and endurance of community level function            Baseline: 4-/5 10-20-22  See flow chart See flow chart  hip abd 3+-4-/5            Goal status: ONGOING 01-26-23  8.  Elizabeth Wang  will show improvement in walking steps from 2500 steps a day to at least 5000 steps a day or more Baseling 2500 steps a day Goal Statues INITIAL 01-26-23            PLAN:   PT FREQUENCY: 1-2x/week   PT DURATION: 8 weeks   PLANNED INTERVENTIONS: Therapeutic exercises, Therapeutic activity, Neuromuscular re-education, Balance training, Gait training, Patient/Family education, Self Care, Joint mobilization, Stair training, Dry Needling, Electrical stimulation, Cryotherapy, Moist heat, Taping, Ionotophoresis 4mg /ml Dexamethasone, Manual therapy, and Re-evaluation   PLAN FOR NEXT SESSION: TPDN, Manual, Response to edema reduction.  Gait training, how was arch taping. R glute med strengthening.   May add prone quad stretch to HEP   Work  on [place] form and add posterior chain exercises to [employer] Working on transitioning to a Physiological scientist for continued strengthening.  ERO next session.    Yaser Harvill PT, DPT, LAT, ATC  01/11/23  11:10 AM

## 2023-01-11 NOTE — Telephone Encounter (Signed)
Pt called stating that she needs a new prescription for her CPAP machine sent to [facility]

## 2023-01-13 ENCOUNTER — Encounter (HOSPITAL_BASED_OUTPATIENT_CLINIC_OR_DEPARTMENT_OTHER): Payer: Self-pay | Admitting: Cardiology

## 2023-01-19 ENCOUNTER — Other Ambulatory Visit (HOSPITAL_BASED_OUTPATIENT_CLINIC_OR_DEPARTMENT_OTHER): Payer: Self-pay

## 2023-01-19 MED ORDER — ZOSTER VAC RECOMB ADJUVANTED 50 MCG/0.5ML IM SUSR
INTRAMUSCULAR | 0 refills | Status: AC
  Filled 2023-01-19: qty 0.5, 1d supply, fill #0

## 2023-01-20 NOTE — Therapy (Signed)
OUTPATIENT PHYSICAL THERAPY TREATMENT NOTE/Discharge Note PHYSICAL THERAPY DISCHARGE SUMMARY  Visits from Start of Care: 20  Current functional level related to goals / functional outcomes: As indicated   Remaining deficits: Pt seeing podiatrist for LT toe wound. 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 Patient goals were  mostly met . Patient is being discharged due to meeting the stated rehab goals.  Pt has personal trainer she attends 2 x a week in order to continue consistent strengthening.     Patient Name: Elizabeth Wang MRN: [id number] DOB:09-09-1946, 77 y.o., female Today's Date: 01/21/2023  PCP: Ginger Organ. 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   Paroxysmal atrial fibrillation (HCC)    1 isolated episode in the setting of acute stress with no further episodes.    Pseudogout    Past Surgical History:  Procedure  Laterality Date   BREAST RECONSTRUCTION     CARDIOVERSION N/A 06/27/2020   Procedure: CARDIOVERSION;  Surgeon: [name], MD;  Location: [facility];  Service: Cardiovascular;  Laterality: N/A;   CESAREAN SECTION     COLONOSCOPY  2004   repeat in ten years   KNEE ARTHROSCOPY Left    TONSILLECTOMY     TOTAL KNEE ARTHROPLASTY Right 01/01/2014   Procedure: TOTAL RIGHT KNEE ARTHROPLASTY;  Surgeon: [name], MD;  Location: [facility];  Service: Orthopedics;  Laterality: Right;   TOTAL KNEE ARTHROPLASTY Left 07/23/2014   Procedure: LEFT TOTAL KNEE ARTHROPLASTY;  Surgeon: [name], MD;  Location: [facility];  Service: Orthopedics;  Laterality: Left;   tummy tuck  1987   WISDOM TOOTH EXTRACTION     Patient Active Problem List   Diagnosis Date Noted   Closed fracture of proximal phalanx of lesser toe of right foot 09/01/2022   Osteoarthritis of talonavicular joint 09/01/2022   Closed fracture of fifth metatarsal bone 09/05/2021   Contusion of left knee 04/05/2019   Pain in left knee 04/03/2019   Osteoarthritis of right glenohumeral joint 11/24/2017  Pain of left great toe 12/17/2016   Skin ulcer of left great toe, limited to breakdown of skin ([place]) 12/17/2016   Gout 01/12/2014   S/P total knee arthroplasty 01/11/2014   Hypokalemia 01/03/2014   Hyponatremia 01/03/2014   Postoperative anemia due to acute blood loss 01/02/2014   Hypothyroidism 01/02/2014   Other and unspecified hyperlipidemia 01/02/2014   OA (osteoarthritis) of knee 01/01/2014   Essential hypertension, benign 11/28/2013   Pulmonary embolism ([place]) 11/28/2013   DVT (deep venous thrombosis) ([place]) 08/09/2013   Other pulmonary embolism and infarction 08/09/2013   Persistent atrial fibrillation ([place]) 07/14/2013    REFERRING DIAG: M25.572 (ICD-10-CM) - Pain in left ankle and joints of left foot    THERAPY DIAG:  Difficulty in walking, not elsewhere classified  Stiffness of left ankle, not elsewhere  classified  Pain of left great toe  Rationale for Evaluation and Treatment Rehabilitation  PERTINENT HISTORY: 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, Diverticulosis, Dyslipidemia, HTN, OA, asthma, numbness in toes, obesity, OSA, Paroxysmal atrial fibrillation, pseudogout   PRECAUTIONS: N/A  SUBJECTIVE:                                                                                                                                                                                      SUBJECTIVE STATEMENT:  " I am doing pretty good, I did see the podiatrist earlier that did some work on my toe."  PAIN:  2-22-4   0/10 12-15-22 4/10 LT ankle 01-07-23  ankle  0/10, toe 8/10 ( under podiatrist care) 01-21-23  ankle 0.10 toe 8-10/10 ( under podiatrist care)  Are you having pain? Yes: NPRS scale: 0/10 Pain location: great toe LT,  unable to descend 1st ray Pain description: dull ache Aggravating factors: walking on it when I walk > 7000 steps,  just living,  sometimes just throbbing, steps Relieving factors: wearing a toe pad, salonpas topical No pain in my left ankle, uncomfortable because it does not bend   OBJECTIVE: (objective measures completed at initial evaluation unless otherwise dated)   DIAGNOSTIC FINDINGS: x rays recently taken but not available at [place]:  FOTO 51%  predicted 64% 10-20-22 73% 11/02/2022 49% 12-15-22 65%   COGNITION: Overall cognitive status: Within functional limits for tasks assessed                         SENSATION: WFL   EDEMA:  Figure 8: RT 54 cm, LT 61 cm 10-15-22 10:30 AM  LT 59.5 cm 12-15-22  RT 54, LT  59 cm   MUSCLE LENGTH: Hamstrings: Right 53 deg; Left 50 deg  10-15-22 LLD 31 in ASIS to malleolus LT  RT 31.75 inch     POSTURE: rounded shoulders, forward head, flexed trunk , and obesity   PALPATION: Not pain but increased stiffness / blocked talar glide   LOWER EXTREMITY ROM:   Active ROM Right eval Left eval LT  10-15-22 10-20-22 LT 12-01-22 01-07-23  Hip flexion          Hip extension          Hip abduction          Hip adduction          Hip internal rotation          Hip external rotation          Knee flexion          Knee extension          Ankle dorsiflexion 5 +9 from neutral 0 0 0 0  Ankle plantarflexion 65 51  55 60 60  Ankle inversion 27 14  25 25 25   Ankle eversion 20 14  15 29 30   Great toe distal flexion 50 25  39 65 65  Great toe  extension 45 10  10 10 12    (Blank rows = not tested)   LOWER EXTREMITY MMT:   MMT Right eval Left eval RT/LT 10-20-22 RT/ LT 11/02/2022 RT/LT 12-15-22 RT/LT 01-19-23  Hip flexion 4+ 4 4+/4 4+/4+ 4+/4+ 4=/4+  Hip extension 4 4- 4/4 4/4 4/4 4+/4  Hip abduction 4- 4- 4-/4- 4-/4- 4-/3+ 4/4  Hip adduction          Hip internal rotation          Hip external rotation          Knee flexion          Knee extension 5 4 5/4+  5/4+ 5/5  Ankle dorsiflexion 5 4 5/4- 5/4+ 5/4- 5/4+  Ankle plantarflexion       RT/LT  15/25 and LT 9/25 RT/LT 20/25 LT16/25  Ankle inversion 4+ 4- 4+/4 5/ 4+ 5/4+ 5/4+  Ankle eversion 4+ 4- 4+/4 5/4+ 5/4+ 5/4+   (Blank rows = not tested)       FUNCTIONAL TESTS:  5 times sit to stand: 11.15 6 MWT 12-21-23824 ft 1290-1755 ft            Sustained bridge  32 seconds             SLS RT 23 sec  LT 10sec but pain   Asterisk Signs Eval (12-7- 2023) 10-15-22  10-20-22  11/05/2022  11-24-22  12-01-22  12-15-22 01-07-23 01-21-23  5 x  STS 11.73 sec but wt bears to RT 7.66 sec wt bears to RT   9.66 sec with equal wt bear  9 secs     8.64 sec  6.96 sec  Max pain LT great toe 9/10 3/10  3/10 most of the time but occassionally 9/10 as in today   1-2/10 in ankle and toe  65 PIP GREAT toe LT  65 PIP GREAT toe LT   Max pain 5/10 65 Great toe LT extenson Max pain 9/10 65 Great toe LT End of toe 8-10/10 under podiatrist care   6MWT   824 ft 1290-1755 ft NT   966 ft     919 ft feeling fatigued  HR 115  936 ft pt with sore toe impeding and  causing 2 stumbles LOB   sustained bridge  32 sec    Cramping  in hamstring NT        85 sec  but decreaseavailable range  NT   SLS RT 23/LT 10 sec  RT 23 LT 7 sec  RT < 3  LT < 3 checked with and without shoes       RT < 6 LT < 3 checked with and without shoes  RT < 6 LT < 3 checked with and without shoes  DF LT  DF 0 LT DF- 0 LT   DF 0 deg    LT DF 2 degrees  Floor to stand x fer         Unable to perform  Pt indepnedently rises from ground with cushion under knee  Hip abduction       4-/4- LT/RT  4/4      GAIT: Distance walked: 150  Status 12-15-22  919 ft fatigued Assistive device utilized: None Level of assistance: Complete Independence Comments: antalgic gait left, with trunk lean to RT Pt with hammer toes and ulcer on LT great toe healing , with an extension pad.  Pt is unable to depress 1st ray with gait in stance/ push off phase, Pt tends to toe grip with shoes and is wearing more sneakers rather than sandals presently Trendelenberg gait.   TODAY'S TREATMENT:  [facility] Adult PT Treatment:                                                DATE: 01-21-23 5x STS 6.96 sec  HB:[id number] Ft AROM/MMT  See chart  Therapeutic Exercise: AMRAP  1 min each   ( 2 round with VC and TC) Kickstand deadlift (RT leg in kickstand, KB in LT hand) with rotation using 15 # KB (touch foot stool with KB) Side lean with KB in LT hand (exercising RT Quadratus Lumborum Kickstand deadlift( LT leg in kickstand, KB in RT hand) with rotation using 15 # KB  (touch foot stool with KB) Side lean with KB in RT hand (exercising LT Quadratus Lumborum Toe up bil for tibialis anterior with back against wall Review exercise and HEP and educated and clarified questions about HEP  [facility] Adult PT Treatment:                                                DATE: 01/11/2023 Todays session performed in parallel bars  Therapeutic Exercise: AMRAP LE (Lower Extremity)  2 rounds Hip abduction with GTB with 5 sec end range  oscillations Hip extension  With GTB with 5 sec end range oscillations SL heel raise RT  SL heel raise LT Heel raise bilaterally off edge of 2 inch step Monster walks Forward/ BWD in //  with GTB Squat with heel raise at standing while holding black weighted ball touching target on wall for consistency/ height (performed by //) Sit to stand from chair with one leg advanced by 4 inches (performed on round 2)  Neuromuscular re-ed: 2 rounds -1 performed at beginning of session, 2nd performed after 2 rounds of There ex. Rhomberg on airex pad with bil UE holding red ball ABCs x 1 Modified tandem ABCs holding red physioball x 2 (alternating lead leg on set 2)   [facility] Adult PT Treatment:  DATE: 01-07-23 Therapeutic Exercise: AMRAP LE (Lower Extremity)  4 rounds  Hip abduction with GTB VC and TC standing at counter  hold for 3 sec Hip extension  With GTB ( hold for 3 sec)   Heel raise bilaterally  SL heel raise RT  SL heel raise LT Monster walks along counter VC for technique  with GTB STS with 15 pound DB 3 x 10 in each hand SELF CARE Went over how to progress exercises for flexibility, balance, endurance and strength on 2 page handout and answered questions,    [facility] Adult PT Treatment:                                                DATE: 12-31-22 Therapeutic Exercise Educated on  [facility] process.  Extra time needed to correct form and execution of exercise. AMRAP  UE/LE 1 min each (As Many Repetitions as Possible) 2 rounds   Bridge  60 sec RT Side lying hip adduction  30 sec followed by side lying hip abduction  LT Side lying hip adduction 30 sec followed by side lying hip abduction UE ROW RT 30 sec, LT 30 sec using 15 # wt   AMRAP LE 1 min each   2 rounds Lunge while holding back of chair (modified with cushion and 1/2 range as needed) Sit to stand with weight  15 # KB Lateral step ups  with hip hike 30 sec  on RT and LT Table top position  with Both legs and then horizontal abduction with theraband Self Care  Educated on community wellness and you tube modified cardio/strengthening exercises Educated on Pt hip weakness   PATIENT EDUCATION: updated 10/29/2022 Education details: for Edema reduction her husband could use lotion and perform the technique on her ankle while she is in prone. Discussed compression stocking to get something taut but comfortable(otherwise too tight may be counter productive) and could trial using ace wraps to see how she does with it before purchasing compression stockings. Added AMRAP exercise for endurance strength program Person educated: Patient Education method: Explanation Education comprehension: verbalized understanding and needs further education   HOME EXERCISE PROGRAM:  Access Code: [email or web address] URL: [email or web address] Date: 12/01/2022 Prepared by: [name]  Program Notes Toe Yoga as shown in clinic Dorsiflexion stretch on footstool with power band as shown in clinic.  Buy on [employer] with various strengths  Exercises - Seated Toe Raise  - 1 x daily - 7 x weekly - 2 sets - 10 reps - Seated Calf Stretch with Strap  - 1 x daily - 7 x weekly - 2 sets - 3 reps - 30 hold - Standing 3-Way Leg Reach with Resistance at Ankles and Counter Support  - 1 x daily - 7 x weekly - 3 sets - 10 reps - Sit to stand with sink support Movement snack  - 1 x daily - 7 x weekly - 3 sets - 10 reps - Single Leg Stance with Support  - 2 x daily - 7 x weekly - 1 sets - 3-5 reps - hold 15 sec hold - Heel raise with counter support and towel under toes  - 1 x daily - 7 x weekly - 3 sets - 10 reps - Seated Figure 4 Ankle Inversion with Resistance  - 1 x daily - 7 x weekly - 3 sets -  10 reps - Seated Ankle Eversion with Resistance  - 1 x daily - 7 x weekly - 3 sets - 10 reps - Hip Hiking on Step  - 1 x daily - 7 x weekly - 3 sets - 10 reps - Sidelying Hip Abduction  - 1 x daily - 7 x  weekly - 3 sets - 12 reps - Woodpecker one leg anterior, lateral and medial  - 1 x daily - 7 x weekly - 6 sets - 10 reps - Sidelying Reverse Clamshell with Resistance  - 1 x daily - 7 x weekly - 3 sets - 10 reps - Clamshell with Resistance  - 1 x daily - 7 x weekly - 3 sets - 10 reps - Goblet Squat with Kettlebell  - 1 x daily - 7 x weekly - 3 sets - 10 reps - Lunge with Counter Support  - 1 x daily - 7 x weekly - 3 sets - 10 reps - [employer] on Counter  - 1 x daily - 7 x weekly - 3 sets - 10 reps - Single Leg Heel Raise with Counter Support  - 1 x daily - 7 x weekly - 3 sets - 10 reps AMRAP  UE/LE 1 min each (As Many Repetitions as Possible) Can do 2-3 rounds   Bridge  60 sec RT Side lying hip adduction  30 sec followed by side lying hip abduction  LT Side lying hip adduction 30 sec followed by side lying hip abduction UE ROW RT 30 sec, LT 30 sec using 15 # wt   AMRAP LE 1 min each   Can do 2-3 rounds Lunge while holding back of chair (modified with cushion and 1/2 range as needed) Sit to stand with weight  15 # KB Lateral step ups  with hip hike 30 sec  on RT and LT Table top position with Both legs and then horizontal abduction with theraban Access Code: [email or web address] URL: [email or web address] Date: 12/17/2022 Prepared by: [name]  Program Notes Do this as an AMRAP (as many repetitions as possible)Do for one minute increments.  Hip abduction  30 sec on each side.  Do 3 to 4 rounds   AMRAP Exercises - Bench press for a minute  - 1 x daily - 7 x weekly - 3 sets - 10 reps - Bridge for 1 minute  - 1 x daily - 7 x weekly - 3 sets - 10 reps - sidelying hip abduction for 30 seconds  - 1 x daily - 7 x weekly - 3 sets - 10 reps - [employer] for one minute  - 1 x daily - 7 x weekly - 3 sets - 10 reps ASSESSMENT:   CLINICAL IMPRESSION: Mrs Leason arrived to session reporting no pain in LT ankle but ongoing toe pain 8-10/10 and being seen by podiatrist for  chronic open wound. Pt improved with her 6 MWT but was impeded by toe pain.  Pt has accomplished most of goals but still with hip weakness due to Trendelenburg gait and charcot like LT ankle.  Pt has gained DF in LT ankle to 2 but with boney block has not improved more.  Pt is stronger and able to perform 5 X STS in 6.96 sec and is at low risk for fall.  Pt does have some balance issues and  cannot maintain SLS for longer than 3-5 sec due to probable proximal hip weakness.  Pt is now being DC  having made gains and  will be continuing strengthening with personal trainer 2 x a weak.  Ms Kleinberg has been a joy for whom to serve and we wish her best.       OBJECTIVE IMPAIRMENTS: Abnormal gait, decreased balance, difficulty walking, decreased ROM, decreased strength, hypomobility, obesity, and pain.    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    REHAB POTENTIAL: Good   CLINICAL DECISION MAKING: Evolving/moderate complexity   EVALUATION COMPLEXITY: Moderate     GOALS: Goals reviewed with patient? No   SHORT TERM GOALS: Target date: 10-29-21 Pt will be independent with intial HEP Baseline:no knowledge,  10-07-22 initial HEP given  Goal status: MET   2.  Pt will be able perform STS with even wt distribution in bil LE Baseline: Pt wt bears to RT in STS Goal status: MET   3.  Pt pain with functional activities in Great toe will be reduced to 5/10  Baseline: 9/10 10-15-22  3/10 Goal status: MET     LONG TERM GOALS: Target date: 11-26-22 revised to 01-26-23   Pt will be independent with advanced HEP including AMRAP/EMOM supersets for building  strength and endurance (POC 01-26-23 Baseline: Pt needs reinforcement of HEP for proper technique and execution Goal status: MET   2.  FOTO will improve from  51%  to   64%  indicating improved functional mobility.  Baseline: eval 51% 10-20-22 73%  65% 12-15-22 Goal status: MET   3.  Pain will decrease to 3/10 with all functional activities from 9/10 Baseline: Pt max pain after functional activities. 4-5/10 05-28-23 Status: continues to have pain 01-21-23 ankle pain 0/10 but toe 8/10 to 10/10 Goal status: MET    4.  Pt will be able to perform 6MWT within normal range for age Baseline: 824 ft 1290-1755 ft  12-15-22 942ft Status 936 ft   impeded by sore toe Goal status:Partially MET   5.  Pt will be able to perform balance with SLS for at least 15 sec on RT and LT to show improved balance/strength in SL activities Baseline: RT 23 sec  LT 10 sec tries to use UE 10-20-22 < 3 sec BIL today Status: 12-15-22  LT 3 sec and RT 4 sec 06-15-23 RT 6 sec, LT , 3 sec Goal status: Revised for 01-26-23    6.  Pt will be able to perform floor to stand transfer to show  increased strength and safe fall preparedness  Baseline: unable to perform Status: unable to perform 12-15-22  Pt needs min assist to rise and VC for LE positioning Goal status: MET    7. Demonstrate 4 + /5 LT LE strength to improve stability, safety and endurance of community level function            Baseline: 4-/5 10-20-22  See flow chart See flow chart  hip abd 3+-4-/5  01-21-23  4/5            Goal status: PARTIALLY MET  8.  Ms Cordell  will show improvement in walking steps from 2500 steps a day to at least 5000 steps a day or more [place] 2500 steps a day Status  01-21-23 between 3000 to 5000 steps a day. Goal Statues MET  PLAN:   PT FREQUENCY: 1-2x/week   PT DURATION: 8 weeks   PLANNED INTERVENTIONS: Therapeutic exercises, Therapeutic activity, Neuromuscular re-education, Balance training, Gait training,  Patient/Family education, Self Care, Joint mobilization, Stair training, Dry Needling, Electrical stimulation, Cryotherapy, Moist heat, Taping, Ionotophoresis 4mg /ml Dexamethasone, Manual therapy, and Re-evaluation   PLAN FOR NEXT SESSION: TPDN, Manual, Response to edema reduction.  Gait training, how was arch taping. R glute med strengthening.   May add prone quad stretch to HEP   Work on [place] form and add posterior chain exercises to [employer] Working on transitioning to a Physiological scientist for continued strengthening.   [place], PT, [place] Certified Exercise Expert for the Aging Adult  01/21/23 4:04 PM Phone: [phone] Fax: [phone]

## 2023-01-20 NOTE — Telephone Encounter (Signed)
Referral was sent to [facility] for both Elizabeth Wang and Elizabeth Wang. Their contact phone number is [phone].

## 2023-01-21 ENCOUNTER — Ambulatory Visit: Payer: Medicare Other | Admitting: Physical Therapy

## 2023-01-21 DIAGNOSIS — M79675 Pain in left toe(s): Secondary | ICD-10-CM | POA: Diagnosis not present

## 2023-01-21 DIAGNOSIS — M25672 Stiffness of left ankle, not elsewhere classified: Secondary | ICD-10-CM

## 2023-01-21 DIAGNOSIS — R262 Difficulty in walking, not elsewhere classified: Secondary | ICD-10-CM | POA: Diagnosis not present

## 2023-01-21 NOTE — Patient Instructions (Signed)
   AMRAP  UE/LE( Upper Extremity /Lower Extremity) 1 min each (As Many Repetitions as Possible) 2 rounds   Bridge  60 sec RT Side lying hip adduction  30 sec followed by side lying hip abduction  LT Side lying hip adduction 30 sec followed by side lying hip abduction Upper extremity row (hand in chair seat and pull up with other arm in a  ROW RT 30 sec, LT 30 sec using 15 # wt   AMRAP LE 1 min each   2 rounds Lunge while holding back of chair (modified with cushion and 1/2 range as needed) Sit to stand with weight  15 # KB Lateral step ups  with hip hike 30 sec  on RT and LT Table top position with Both legs and then horizontal abduction with theraband     AMRAP LE (Lower Extremity)               1. Standing at counter with Theraband around ankles  Hip abduction ( lead with heel of foot) ( hold for 3 sec)  Doing things slowly reveals weaknes)             2. Standing at counter with Theraband around ankles  Hip extension ( hold for 3 sec)  Doing things slowly reveals weaknes)             3. Heel raise bilaterally ( progress to single leg balance for challenge)             4.Monster walks along counter( Sit down into it before you side step)      AMRAP  1 min each   ( 3 rounds Kickstand deadlift (RT leg in kickstand, KB in LT hand) with rotation using 15 # KB (touch foot stool with KB) Side lean with KB in LT hand (exercising RT Quadratus Lumborum Kickstand deadlift( LT leg in kickstand, KB in RT hand) with rotation using 15 # KB  (touch foot stool with KB) Side lean with KB in RT hand (exercising LT Quadratus Lumborum  New exercise- back against wall and toes up bilaterally x 30    [name], PT, [facility] Certified Exercise Expert for the Aging Adult  01/21/23 8:46 AM Phone: [phone] Fax: [phone]

## 2023-01-25 ENCOUNTER — Encounter: Payer: Self-pay | Admitting: Podiatry

## 2023-01-25 ENCOUNTER — Ambulatory Visit (INDEPENDENT_AMBULATORY_CARE_PROVIDER_SITE_OTHER): Payer: Medicare Other | Admitting: Podiatry

## 2023-01-25 DIAGNOSIS — L600 Ingrowing nail: Secondary | ICD-10-CM | POA: Diagnosis not present

## 2023-01-25 DIAGNOSIS — L97521 Non-pressure chronic ulcer of other part of left foot limited to breakdown of skin: Secondary | ICD-10-CM | POA: Diagnosis not present

## 2023-01-25 DIAGNOSIS — G629 Polyneuropathy, unspecified: Secondary | ICD-10-CM | POA: Diagnosis not present

## 2023-01-25 NOTE — Progress Notes (Signed)
Subjective:  Patient ID: Elizabeth Wang, female    DOB: [birth year],   MRN: [id number]  Chief Complaint  Patient presents with   Wound Check    Patient is here today for his wound check on his right third toe.    77 y.o. female presents for follow-up of left hallux wound. Relates it was doing better has been dressing it and has been in PT to help her gait and prevent rubbing in this area on her toe. Continue PT.   Relates the third toe has been quite painful for her. Ready to have procedure to remove.  Denies any other pedal complaints. Denies n/v/f/c.   Past Medical History:  Diagnosis Date   Acute pulmonary embolism 06/2013   Arthritis    Asthma    years ago    Clotting disorder    DVT/PE   Diverticulosis    minor    Dyslipidemia    Esophageal reflux    Hypertension    Hypothyroidism    Numbness    TOES   Obesity    OSA (obstructive sleep apnea)    severe OSA with an AHI of 51/hr and nocturnal hypoxemia with O2 sats at low as 69%.   Paroxysmal atrial fibrillation    1 isolated episode in the setting of acute stress with no further episodes.    Pseudogout     Objective:  Physical Exam: Vascular: DP/PT pulses 2/4 bilateral. CFT <3 seconds. Normal hair growth on digits. No edema.  Skin. No lacerations or abrasions bilateral feet. Hyperkeratotic lesion noted to lateral third digit around nail plate and some incurvation of right third digit nail. Left hallux ulcer reopened measures 0.3 cm x 0.2 cm x 0.1 cm   No erythema edema or purulence noted.  Musculoskeletal: MMT 5/5 bilateral lower extremities in DF, PF, Inversion and Eversion. Deceased ROM in DF of ankle joint. Hallux malleus noted.  Neurological: Sensation intact to light touch.   Assessment:   1. Skin ulcer of left great toe, limited to breakdown of skin   2. Neuropathy   3. Ingrown nail of third toe of right foot           Plan:  Patient was evaluated and treated and all questions answered. Ulcer left hallux  wound limited to breakdown of skin  -Ulcer debrided procedure below.   -Dressed with offloading pads.  -Off-loading with aperture pads. -Discussed avoiding any pedicures and staying off of this foot as much as possible.  -Discussed glucose control and proper protein-rich diet.  -Discussed if any worsening redness, pain, fever or chills to call or may need to report to the emergency room. Patient expressed understanding.  -Discussed possible surgery to straighten hallux hammertoe to prevent future ulceration. Did discuss she is in PT now to work on ankle ROM and gait and we will see if this improves the ulceration.   Next visit discussed ingrown nail procedure as nail continues to be bothersome.   Procedure: Excisional Debridement of Wound Rationale: Removal of non-viable soft tissue from the wound to promote healing.  Anesthesia: none Pre-Debridement Wound Measurements: Overlying callus  Post-Debridement Wound Measurements: 0.3 cm x 0.2 cm x 0.1 cm  Type of Debridement: Sharp Excisional Tissue Removed: Non-viable soft tissue Depth of Debridement: subcutaneous tissue. Technique: Sharp excisional debridement to bleeding, viable wound base.  Dressing: Dry, sterile, compression dressing. Disposition: Patient tolerated procedure well. Patient to return in 2 week for follow-up.  Discussed ingrown toenails etiology and treatment options including procedure  for removal vs conservative care.  Patient requesting removal of ingrown nail today. Procedure below.  Discussed procedure and post procedure care and patient expressed understanding.  Will follow-up in 2 weeks for nail check or sooner if any problems arise.    Procedure:  Procedure: partial Nail Avulsion of right third digit nail border.  Surgeon: Lorenda Peck, DPM  Pre-op Dx: Ingrown toenail without infection Post-op: Same  Place of Surgery: Office exam room.  Indications for surgery: Painful and ingrown toenail.    The patient  is requesting removal of nail with chemical matrixectomy. Risks and complications were discussed with the patient for which they understand and written consent was obtained. Under sterile conditions a total of 3 mL of  1% lidocaine plain was infiltrated in a hallux block fashion. Once anesthetized, the skin was prepped in sterile fashion. A tourniquet was then applied. Next the lateral aspect of right third digit nail border was then sharply excised making sure to remove the entire offending nail border.  Next phenol was then applied under standard conditions and copiously irrigated. Silvadene was applied. A dry sterile dressing was applied. After application of the dressing the tourniquet was removed and there is found to be an immediate capillary refill time to the digit. The patient tolerated the procedure well without any complications. Post procedure instructions were discussed the patient for which he verbally understood. Follow-up in two weeks for nail check or sooner if any problems are to arise. Discussed signs/symptoms of infection and directed to call the office immediately should any occur or go directly to the emergency room. In the meantime, encouraged to call the office with any questions, concerns, changes symptoms.   Return in about 2 weeks (around 02/08/2023) for wound check, nail check.       Return in about 2 weeks (around 02/08/2023) for wound check, nail check.   Lorenda Peck, DPM

## 2023-01-25 NOTE — Patient Instructions (Signed)

## 2023-01-28 ENCOUNTER — Ambulatory Visit: Payer: Medicare Other | Admitting: Physical Therapy

## 2023-01-28 DIAGNOSIS — H43813 Vitreous degeneration, bilateral: Secondary | ICD-10-CM | POA: Diagnosis not present

## 2023-01-28 DIAGNOSIS — H43393 Other vitreous opacities, bilateral: Secondary | ICD-10-CM | POA: Diagnosis not present

## 2023-01-28 DIAGNOSIS — H2513 Age-related nuclear cataract, bilateral: Secondary | ICD-10-CM | POA: Diagnosis not present

## 2023-01-28 DIAGNOSIS — H53143 Visual discomfort, bilateral: Secondary | ICD-10-CM | POA: Diagnosis not present

## 2023-02-04 ENCOUNTER — Encounter: Payer: Medicare Other | Admitting: Physical Therapy

## 2023-02-08 ENCOUNTER — Ambulatory Visit (INDEPENDENT_AMBULATORY_CARE_PROVIDER_SITE_OTHER): Payer: Medicare Other | Admitting: Podiatry

## 2023-02-08 ENCOUNTER — Encounter: Payer: Self-pay | Admitting: Podiatry

## 2023-02-08 DIAGNOSIS — L97521 Non-pressure chronic ulcer of other part of left foot limited to breakdown of skin: Secondary | ICD-10-CM

## 2023-02-08 DIAGNOSIS — L600 Ingrowing nail: Secondary | ICD-10-CM

## 2023-02-08 DIAGNOSIS — G629 Polyneuropathy, unspecified: Secondary | ICD-10-CM | POA: Diagnosis not present

## 2023-02-08 NOTE — Progress Notes (Signed)
  Subjective:  Patient ID: Elizabeth Wang, female    DOB: Feb 08, 1946,   MRN: [id number]  No chief complaint on file.   77 y.o. female presents for follow-up of left hallux wound and right third digit nail procedure. . Relates wound has taken a step back and bleeding more than usual. Right third toe doing well. Has been soaking as instructed.   Denies any other pedal complaints. Denies n/v/f/c.   Past Medical History:  Diagnosis Date   Acute pulmonary embolism 06/2013   Arthritis    Asthma    years ago    Clotting disorder    DVT/PE   Diverticulosis    minor    Dyslipidemia    Esophageal reflux    Hypertension    Hypothyroidism    Numbness    TOES   Obesity    OSA (obstructive sleep apnea)    severe OSA with an AHI of 51/hr and nocturnal hypoxemia with O2 sats at low as 69%.   Paroxysmal atrial fibrillation    1 isolated episode in the setting of acute stress with no further episodes.    Pseudogout     Objective:  Physical Exam: Vascular: DP/PT pulses 2/4 bilateral. CFT <3 seconds. Normal hair growth on digits. No edema.  Skin. No lacerations or abrasions bilateral feet. Right third digit nail healing well lateral border. Left hallux ulcer opened measures 0.4 cm x 0.5 cm x 0.2 cm   No erythema edema or purulence noted.  Musculoskeletal: MMT 5/5 bilateral lower extremities in DF, PF, Inversion and Eversion. Deceased ROM in DF of ankle joint. Hallux malleus noted.  Neurological: Sensation intact to light touch.   Assessment:   1. Skin ulcer of left great toe, limited to breakdown of skin   2. Neuropathy   3. Ingrown nail of third toe of right foot           Plan:  Patient was evaluated and treated and all questions answered. Ulcer left hallux wound limited to breakdown of skin  -Ulcer debrided procedure below.   -Dressed with offloading pads.  -Off-loading with aperture pads.Discussed wearing surgical shoe.  -Discussed avoiding any pedicures and staying off of this  foot as much as possible.  -Discussed glucose control and proper protein-rich diet.  -Discussed if any worsening redness, pain, fever or chills to call or may need to report to the emergency room. Patient expressed understanding.  -Discussed possible surgery to straighten hallux hammertoe to prevent future ulceration. Did discuss she is in PT now to work on ankle ROM and gait and we will see if this improves the ulceration.   Next visit discussed ingrown nail procedure as nail continues to be bothersome.   Procedure: Excisional Debridement of Wound Rationale: Removal of non-viable soft tissue from the wound to promote healing.  Anesthesia: none Pre-Debridement Wound Measurements: Overlying callus  Post-Debridement Wound Measurements: 0.4 cm x 0.5 cm x 0.2 cm  Type of Debridement: Sharp Excisional Tissue Removed: Non-viable soft tissue Depth of Debridement: subcutaneous tissue. Technique: Sharp excisional debridement to bleeding, viable wound base.  Dressing: Dry, sterile, compression dressing. Disposition: Patient tolerated procedure well. Patient to return in 2 week for follow-up.  Toe was evaluated and appears to be healing well.  May discontinue soaks and neosporin.  Patient to follow-up as needed.     No follow-ups on file.       No follow-ups on file.   Louann Sjogren, DPM

## 2023-02-11 ENCOUNTER — Encounter: Payer: Medicare Other | Admitting: Physical Therapy

## 2023-02-16 ENCOUNTER — Other Ambulatory Visit: Payer: Self-pay

## 2023-02-16 DIAGNOSIS — I10 Essential (primary) hypertension: Secondary | ICD-10-CM

## 2023-02-16 DIAGNOSIS — E782 Mixed hyperlipidemia: Secondary | ICD-10-CM

## 2023-02-16 MED ORDER — ROSUVASTATIN CALCIUM 10 MG PO TABS: 10.0000 mg | ORAL_TABLET | Freq: Every day | ORAL | 2 refills | Status: DC

## 2023-02-18 ENCOUNTER — Encounter: Payer: Medicare Other | Admitting: Physical Therapy

## 2023-02-22 ENCOUNTER — Encounter: Payer: Self-pay | Admitting: Podiatry

## 2023-02-22 ENCOUNTER — Other Ambulatory Visit (HOSPITAL_BASED_OUTPATIENT_CLINIC_OR_DEPARTMENT_OTHER): Payer: Self-pay

## 2023-02-22 ENCOUNTER — Ambulatory Visit (INDEPENDENT_AMBULATORY_CARE_PROVIDER_SITE_OTHER): Payer: Medicare Other | Admitting: Podiatry

## 2023-02-22 DIAGNOSIS — G629 Polyneuropathy, unspecified: Secondary | ICD-10-CM

## 2023-02-22 DIAGNOSIS — L97521 Non-pressure chronic ulcer of other part of left foot limited to breakdown of skin: Secondary | ICD-10-CM | POA: Diagnosis not present

## 2023-02-22 DIAGNOSIS — Z23 Encounter for immunization: Secondary | ICD-10-CM | POA: Diagnosis not present

## 2023-02-22 MED ORDER — COMIRNATY 30 MCG/0.3ML IM SUSY
0.3000 mL | PREFILLED_SYRINGE | Freq: Once | INTRAMUSCULAR | 0 refills | Status: AC
  Filled 2023-02-22: qty 0.3, 1d supply, fill #0

## 2023-02-22 NOTE — Progress Notes (Signed)
  Subjective:  Patient ID: Elizabeth Wang, female    DOB: 1945/11/15,   MRN: [id number]  Chief Complaint  Patient presents with   Wound Check    77 y.o. female presents for follow-up of left hallux wound. . . Relates wound doing better. . Right third toe doing well. Has finished soaks.  Denies any other pedal complaints. Denies n/v/f/c.   Past Medical History:  Diagnosis Date   Acute pulmonary embolism (HCC) 06/2013   Arthritis    Asthma    years ago    Clotting disorder (HCC)    DVT/PE   Diverticulosis    minor    Dyslipidemia    Esophageal reflux    Hypertension    Hypothyroidism    Numbness    TOES   Obesity    OSA (obstructive sleep apnea)    severe OSA with an AHI of 51/hr and nocturnal hypoxemia with O2 sats at low as 69%.   Paroxysmal atrial fibrillation (HCC)    1 isolated episode in the setting of acute stress with no further episodes.    Pseudogout     Objective:  Physical Exam: Vascular: DP/PT pulses 2/4 bilateral. CFT <3 seconds. Normal hair growth on digits. No edema.  Skin. No lacerations or abrasions bilateral feet. Right third digit nail healing well lateral border. Left hallux ulcer opened measures 0.4 cm x 0.3 cm x 0.2 cm   No erythema edema or purulence noted.  Musculoskeletal: MMT 5/5 bilateral lower extremities in DF, PF, Inversion and Eversion. Deceased ROM in DF of ankle joint. Hallux malleus noted.  Neurological: Sensation intact to light touch.   Assessment:   1. Skin ulcer of left great toe, limited to breakdown of skin (HCC)   2. Neuropathy            Plan:  Patient was evaluated and treated and all questions answered. Ulcer left hallux wound limited to breakdown of skin  -Ulcer debrided procedure below.   -Dressed with offloading pads.  -Off-loading with aperture pads.Discussed wearing surgical shoe.  -Discussed avoiding any pedicures and staying off of this foot as much as possible.  -Discussed glucose control and proper protein-rich  diet.  -Discussed if any worsening redness, pain, fever or chills to call or may need to report to the emergency room. Patient expressed understanding.  -Discussed possible surgery to straighten hallux hammertoe to prevent future ulceration. Did discuss she is in PT now to work on ankle ROM and gait and we will see if this improves the ulceration.   Next visit discussed ingrown nail procedure as nail continues to be bothersome.   Procedure: Excisional Debridement of Wound Rationale: Removal of non-viable soft tissue from the wound to promote healing.  Anesthesia: none Pre-Debridement Wound Measurements: Overlying callus  Post-Debridement Wound Measurements: 0.4 cm x 0.3 cm x 0.2 cm  Type of Debridement: Sharp Excisional Tissue Removed: Non-viable soft tissue Depth of Debridement: subcutaneous tissue. Technique: Sharp excisional debridement to bleeding, viable wound base.  Dressing: Dry, sterile, compression dressing. Disposition: Patient tolerated procedure well. Patient to return in 2 week for follow-up.  Toe was evaluated and appears to be healing well.  May discontinue soaks and neosporin.  Patient to follow-up as needed.     Return in about 2 weeks (around 03/08/2023) for wound check.       Return in about 2 weeks (around 03/08/2023) for wound check.   Louann Sjogren, DPM

## 2023-02-27 NOTE — Progress Notes (Signed)
Cardiology Office Note:    Date:  03/08/2023   ID:  Arby Barrette, DOB 1946-09-01, MRN [id number]  PCP:  Cleatis Polka., MD   [facility] Providers Cardiologist:  Tobias Alexander, MD Electrophysiologist:  Lanier Prude, MD  Sleep Medicine:  Armanda Magic, MD  { Referring MD: Cleatis Polka., MD    History of Present Illness:    Elizabeth Wang is a 77 y.o. female with a hx of paroxysmal Afib, HTN, right leg DVT and PE 06/2013 who was previously followed by Dr. Delton See who now presents to clinic for follow-up.  Per review of the record, patient has history of isolated Afib in 06/2013 but recurred after receiving COVID vaccine and she was placed on xarelto. Has been followed by Dr. Lalla Brothers and currently pursuing a rate control strategy at this time. Dilt recently increased. If fails rate control and has persistent symptoms, may proceed with ablation. Recent TTE 06/2020 with normal LVEF 60%, moderate RA and LA dilation.   Was seen in clinic by Dr. Lalla Brothers on 04/2021 where she was doing well with rate control strategy. Was also recommended to ensure her BiPAP device was working appropriately. Had follow-up with Otilio Saber on 01/2022 where she was doing well.   Was last seen in clinic on 08/2022 where she was doing well from a CV standpoint. TTE for monitoring of AS showed EF 55-60%, normal RV, severe LAE/RAE, mild MR, mild AS  Today, the patient overall feels well. States she continues to have issue with losing weight and is working on diet and exercise. Otherwise, no chest pain, SOB, palpitations, LE edema, orthopnea or lightheadedness. Remains active and goes to [employer] without exertional symptoms. Does home PT daily. Tolerating xarelto without issue.   Past Medical History:  Diagnosis Date   Acute pulmonary embolism (HCC) 06/2013   Arthritis    Asthma    years ago    Clotting disorder (HCC)    DVT/PE   Diverticulosis    minor    Dyslipidemia    Esophageal reflux     Hypertension    Hypothyroidism    Numbness    TOES   Obesity    OSA (obstructive sleep apnea)    severe OSA with an AHI of 51/hr and nocturnal hypoxemia with O2 sats at low as 69%.   Paroxysmal atrial fibrillation (HCC)    1 isolated episode in the setting of acute stress with no further episodes.    Pseudogout     Past Surgical History:  Procedure Laterality Date   BREAST RECONSTRUCTION     CARDIOVERSION N/A 06/27/2020   Procedure: CARDIOVERSION;  Surgeon: Jake Bathe, MD;  Location: [facility];  Service: Cardiovascular;  Laterality: N/A;   CESAREAN SECTION     COLONOSCOPY  2004   repeat in ten years   KNEE ARTHROSCOPY Left    TONSILLECTOMY     TOTAL KNEE ARTHROPLASTY Right 01/01/2014   Procedure: TOTAL RIGHT KNEE ARTHROPLASTY;  Surgeon: Loanne Drilling, MD;  Location: [facility];  Service: Orthopedics;  Laterality: Right;   TOTAL KNEE ARTHROPLASTY Left 07/23/2014   Procedure: LEFT TOTAL KNEE ARTHROPLASTY;  Surgeon: Loanne Drilling, MD;  Location: [facility];  Service: Orthopedics;  Laterality: Left;   tummy tuck  1987   WISDOM TOOTH EXTRACTION      Current Medications: Current Meds  Medication Sig   acetaminophen (TYLENOL) 500 MG tablet Take 1,000 mg by mouth every 8 (eight) hours as needed for moderate  pain or headache.   ASPERCREME LIDOCAINE EX Apply 1 application topically daily as needed (arthritis pain).   cetirizine (ZYRTEC) 10 MG tablet Take 10 mg by mouth daily as needed for allergies.    colchicine 0.6 MG tablet Take 0.6 mg by mouth at bedtime. For gout   COVID-19 mRNA bivalent vaccine, Pfizer, (PFIZER COVID-19 VAC BIVALENT) injection Inject into the muscle.   COVID-19 mRNA bivalent vaccine, Pfizer, (PFIZER COVID-19 VAC BIVALENT) injection Inject into the muscle.   COVID-19 mRNA vaccine 2023-2024 (COMIRNATY) syringe Inject into the muscle.   COVID-19 mRNA vaccine, Moderna, 100 MCG/0.5ML injection Inject into the muscle.   diltiazem (TIAZAC) 360 MG 24 hr capsule Take 360 mg  by mouth daily.   EPINEPHrine 0.3 mg/0.3 mL IJ SOAJ injection Inject 0.3 mg into the muscle as needed for anaphylaxis (Allergic Reaction).    fluticasone (FLONASE) 50 MCG/ACT nasal spray Place 1-2 sprays into both nostrils daily as needed for allergies.    gabapentin (NEURONTIN) 300 MG capsule Take 300 mg by mouth in the morning, at noon, and at bedtime.   levofloxacin (LEVAQUIN) 500 MG tablet    levothyroxine (SYNTHROID, LEVOTHROID) 125 MCG tablet Take 125 mcg by mouth daily before breakfast. For thyroid therapy   ondansetron (ZOFRAN-ODT) 4 MG disintegrating tablet Take 4 mg by mouth 3 (three) times daily as needed (nausea/vomiting). For N&V   rivaroxaban (XARELTO) 20 MG TABS tablet Take 1 tablet (20 mg total) by mouth daily with supper.   rosuvastatin (CRESTOR) 10 MG tablet Take 1 tablet (10 mg total) by mouth daily.   RSV vaccine recomb adjuvanted (AREXVY) 120 MCG/0.5ML injection Inject into the muscle.   telmisartan (MICARDIS) 80 MG tablet Take 80 mg by mouth daily.   valACYclovir (VALTREX) 1000 MG tablet Take 1,000 mg by mouth daily as needed (outbreaks).    Zoster Vaccine Adjuvanted [facility]) injection Inject into the muscle.   Zoster Vaccine Adjuvanted [facility]) injection Inject into the muscle.   [DISCONTINUED] diltiazem (CARDIZEM CD) 360 MG 24 hr capsule Take 1 capsule (360 mg total) by mouth daily.   [DISCONTINUED] hydrochlorothiazide (HYDRODIURIL) 25 MG tablet Take 1 tablet (25 mg total) by mouth daily.     Allergies:   Nsaids, Bee venom, Other, and Codeine   Social History   Socioeconomic History   Marital status: Married    Spouse name: Not on file   Number of children: Not on file   Years of education: Not on file   Highest education level: Not on file  Occupational History   Occupation: Firefighter  Tobacco Use   Smoking status: Former    Packs/day: 0.25    Years: 12.00    Additional pack years: 0.00    Total pack years: 3.00    Types: Cigarettes    Quit  date: 12/28/1975    Years since quitting: 47.2   Smokeless tobacco: Never  Substance and Sexual Activity   Alcohol use: Yes    Alcohol/week: 4.0 standard drinks of alcohol    Types: 4 Glasses of wine per week    Comment: socially   Drug use: No   Sexual activity: Not on file  Other Topics Concern   Not on file  Social History Narrative   Lives in [place] with her husband. Exercises at the gym 4 times a week.   Social Determinants of Health   Financial Resource Strain: Not on file  Food Insecurity: Not on file  Transportation Needs: Not on file  Physical Activity: Not on  file  Stress: Not on file  Social Connections: Not on file     Family History: The patient's family history includes CVA in her mother; Coronary artery disease in her father; Heart disease in her father; Hypertension in her father.  ROS:   Review of Systems  Constitutional:  Negative for fever and malaise/fatigue.  HENT:  Negative for congestion, nosebleeds and sore throat.   Eyes:  Negative for pain and discharge.  Respiratory:  Negative for cough, sputum production and wheezing.   Cardiovascular:  Negative for chest pain, palpitations, orthopnea, claudication, leg swelling and PND.  Gastrointestinal:  Negative for blood in stool and diarrhea.  Genitourinary:  Negative for hematuria and urgency.  Musculoskeletal:  Positive for joint pain and myalgias. Negative for falls.  Neurological:  Negative for tremors and headaches.  Endo/Heme/Allergies:  Negative for environmental allergies.  Psychiatric/Behavioral:  Negative for depression and memory loss.      EKGs/Labs/Other Studies Reviewed:    The following studies were reviewed today:  TTE: 07/19/2020  1. Left ventricular ejection fraction, by estimation, is 60 to 65%. The  left ventricle has normal function. The left ventricle has no regional  wall motion abnormalities. There is mild left ventricular hypertrophy.  Left ventricular diastolic parameters   are indeterminate.   2. Right ventricular systolic function is normal. The right ventricular  size is normal.   3. Left atrial size was moderately dilated.   4. Right atrial size was moderately dilated.   5. The mitral valve is normal in structure. No evidence of mitral valve  regurgitation. No evidence of mitral stenosis.   6. The aortic valve is tricuspid. Aortic valve regurgitation is not  visualized. Mild aortic valve stenosis. Aortic valve mean gradient  measures 13.0 mmHg.   7. Aortic dilatation noted. There is mild dilatation of the ascending  aorta, measuring 37 mm.   8. The inferior vena cava is normal in size with greater than 50%  respiratory variability, suggesting right atrial pressure of 3 mmHg.   9. The patient is in atrial fibrillation.   EKG:  No new tracing   Recent Labs: No results found for requested labs within last 365 days.   Recent Lipid Panel    Component Value Date/Time   CHOL 138 12/01/2019 1529   TRIG 168 (H) 12/01/2019 1529   HDL 46 12/01/2019 1529   CHOLHDL 3.0 12/01/2019 1529   LDLCALC 64 12/01/2019 1529     Risk Assessment/Calculations:    CHA2DS2-VASc Score = 5  This indicates a 7.2% annual risk of stroke. The patient's score is based upon: CHF History: 0 HTN History: 1 Diabetes History: 0 Stroke History: 0 Vascular Disease History: 1 Age Score: 2 Gender Score: 1          Physical Exam:    VS:  BP 122/74   Pulse 86   Ht 5' 3.5" (1.613 m)   Wt 226 lb 3.2 oz (102.6 kg)   LMP  (LMP Unknown)   SpO2 97%   BMI 39.44 kg/m     Wt Readings from Last 3 Encounters:  03/08/23 226 lb 3.2 oz (102.6 kg)  09/02/22 230 lb 12.8 oz (104.7 kg)  03/02/22 228 lb 12.8 oz (103.8 kg)     GEN: Well nourished, well developed in no acute distress HEENT: Normal NECK: No JVD; No carotid bruits CARDIAC: Irregular, 2/6 systolic murmur RESPIRATORY:  Clear to auscultation without rales, wheezing or rhonchi  ABDOMEN: Soft, non-tender,  non-distended MUSCULOSKELETAL:  Trace ankle  edema, warm SKIN: Warm and dry NEUROLOGIC:  Alert and oriented x 3 PSYCHIATRIC:  Normal affect   ASSESSMENT:    1. PAF (paroxysmal atrial fibrillation) (HCC)   2. Mixed hyperlipidemia   3. Essential hypertension   4. Persistent atrial fibrillation (HCC)   5. Secondary hypercoagulability disorder (HCC)   6. OSA (obstructive sleep apnea)   7. Primary hypertension   8. Mild aortic stenosis   9. Obesity (BMI 35.0-39.9 without comorbidity)     PLAN:    In order of problems listed above:  #Persistent Afib: #Secondary Hypercoaguable Disorder: CHADs-vasc 4. On xarelto for [facility]. Followed by Dr. Lalla Brothers. Plan for rate control strategy for now with consideration of ablation if symptoms worsen. TTE with normal LVEF, moderately dilated RA/LA.  -Continue dilt 360mg  daily -Continue xarelto 20mg  daily -Follow-up with Dr. Lalla Brothers as scheduled  #HTN: Blood pressure well controlled <130/90s. No orthostatic symptoms.  -Continue telmisartan 80mg  daily -Continue HCTZ 25mg  daily -Continue dilt 360mg  daily  #Mild Aortic Stenosis: TTE 08/2022 with stable mild AS with mean gradient . -Continue serial monitoring with next planned 08/2024  #HLD: #Coronary Artery Ca on CT chest: -Continue crestor 10mg  daily -LDL controlled 54 (04/2022) -Goal LDL<70 due to coronary Ca noted on CT chest in 2014  #OSA: -Continue CPAP  #Obesity: BMI 40 Working hard on weight loss. Currently on Noom diet plan. Hoping to resume swimming and water aerobics once her sciatica improves -Continue Noom diet -Resume exercise when able with goal >130min per week  Exercise recommendations: Goal of exercising for at least 30 minutes a day, at least 5 times per week.  Please exercise to a moderate exertion.  This means that while exercising it is difficult to speak in full sentences, however you are not so short of breath that you feel you must stop, and not so comfortable  that you can carry on a full conversation.  Exertion level should be approximately a 5/10, if 10 is the most exertion you can perform.  Diet recommendations: Recommend a heart healthy diet such as the Mediterranean diet.  This diet consists of plant based foods, healthy fats, lean meats, olive oil.  It suggests limiting the intake of simple carbohydrates such as white breads, pastries, and pastas.  It also limits the amount of red meat, wine, and dairy products such as cheese that one should consume on a daily basis.       Follow-up in 6 months.  Medication Adjustments/Labs and Tests Ordered: Current medicines are reviewed at length with the patient today.  Concerns regarding medicines are outlined above.   No orders of the defined types were placed in this encounter.  Meds ordered this encounter  Medications   hydrochlorothiazide (HYDRODIURIL) 25 MG tablet    Sig: Take 1 tablet (25 mg total) by mouth daily.    Dispense:  90 tablet    Refill:  3   diltiazem (CARDIZEM CD) 360 MG 24 hr capsule    Sig: Take 1 capsule (360 mg total) by mouth daily.    Dispense:  90 capsule    Refill:  3   Patient Instructions  Medication Instructions:   Your physician recommends that you continue on your current medications as directed. Please refer to the Current Medication list given to you today.  *If you need a refill on your cardiac medications before your next appointment, please call your pharmacy*    Follow-Up: At [facility], you and your health needs are our priority.  As part  of our continuing mission to provide you with exceptional heart care, we have created designated Provider Care Teams.  These Care Teams include your primary Cardiologist (physician) and Advanced Practice Providers (APPs -  Physician Assistants and Nurse Practitioners) who all work together to provide you with the care you need, when you need it.  We recommend signing up for the patient portal called  "MyChart".  Sign up information is provided on this After Visit Summary.  MyChart is used to connect with patients for Virtual Visits (Telemedicine).  Patients are able to view lab/test results, encounter notes, upcoming appointments, etc.  Non-urgent messages can be sent to your provider as well.   To learn more about what you can do with MyChart, go to [email or web address].    Your next appointment:   6 month(s)  Provider:   Dr. Shari Prows  '    Signed, Meriam Sprague, MD  03/08/2023 9:25 AM    [facility]

## 2023-03-08 ENCOUNTER — Encounter: Payer: Self-pay | Admitting: Cardiology

## 2023-03-08 ENCOUNTER — Ambulatory Visit: Payer: Medicare Other | Attending: Cardiology | Admitting: Cardiology

## 2023-03-08 ENCOUNTER — Ambulatory Visit (INDEPENDENT_AMBULATORY_CARE_PROVIDER_SITE_OTHER): Payer: Medicare Other | Admitting: Podiatry

## 2023-03-08 ENCOUNTER — Encounter: Payer: Self-pay | Admitting: Podiatry

## 2023-03-08 VITALS — BP 122/74 | HR 86 | Ht 63.5 in | Wt 226.2 lb

## 2023-03-08 DIAGNOSIS — I350 Nonrheumatic aortic (valve) stenosis: Secondary | ICD-10-CM | POA: Insufficient documentation

## 2023-03-08 DIAGNOSIS — G629 Polyneuropathy, unspecified: Secondary | ICD-10-CM

## 2023-03-08 DIAGNOSIS — I480 Paroxysmal atrial fibrillation: Secondary | ICD-10-CM | POA: Insufficient documentation

## 2023-03-08 DIAGNOSIS — D6869 Other thrombophilia: Secondary | ICD-10-CM | POA: Insufficient documentation

## 2023-03-08 DIAGNOSIS — G4733 Obstructive sleep apnea (adult) (pediatric): Secondary | ICD-10-CM | POA: Diagnosis not present

## 2023-03-08 DIAGNOSIS — E782 Mixed hyperlipidemia: Secondary | ICD-10-CM | POA: Diagnosis not present

## 2023-03-08 DIAGNOSIS — I4819 Other persistent atrial fibrillation: Secondary | ICD-10-CM | POA: Insufficient documentation

## 2023-03-08 DIAGNOSIS — L97522 Non-pressure chronic ulcer of other part of left foot with fat layer exposed: Secondary | ICD-10-CM | POA: Diagnosis not present

## 2023-03-08 DIAGNOSIS — I10 Essential (primary) hypertension: Secondary | ICD-10-CM | POA: Insufficient documentation

## 2023-03-08 DIAGNOSIS — E669 Obesity, unspecified: Secondary | ICD-10-CM | POA: Diagnosis not present

## 2023-03-08 MED ORDER — HYDROCHLOROTHIAZIDE 25 MG PO TABS: 25.0000 mg | ORAL_TABLET | Freq: Every day | ORAL | 3 refills | Status: DC

## 2023-03-08 MED ORDER — DILTIAZEM HCL ER COATED BEADS 360 MG PO CP24: 360.0000 mg | ORAL_CAPSULE | Freq: Every day | ORAL | 3 refills | Status: DC

## 2023-03-08 NOTE — Patient Instructions (Signed)
Medication Instructions:   Your physician recommends that you continue on your current medications as directed. Please refer to the Current Medication list given to you today.  *If you need a refill on your cardiac medications before your next appointment, please call your pharmacy*    Follow-Up: At [facility], you and your health needs are our priority.  As part of our continuing mission to provide you with exceptional heart care, we have created designated Provider Care Teams.  These Care Teams include your primary Cardiologist (physician) and Advanced Practice Providers (APPs -  Physician Assistants and Nurse Practitioners) who all work together to provide you with the care you need, when you need it.  We recommend signing up for the patient portal called "MyChart".  Sign up information is provided on this After Visit Summary.  MyChart is used to connect with patients for Virtual Visits (Telemedicine).  Patients are able to view lab/test results, encounter notes, upcoming appointments, etc.  Non-urgent messages can be sent to your provider as well.   To learn more about what you can do with MyChart, go to [email or web address].    Your next appointment:   6 month(s)  Provider:   Dr. Pemberton   

## 2023-03-08 NOTE — Progress Notes (Signed)
Subjective:  Patient ID: Elizabeth Wang, female    DOB: [birth year],   MRN: [id number]  Chief Complaint  Patient presents with   Follow-up    Wound check for left great hallux, patient stated she is doing well, minimal drainage, clear fluid, a tinge of pink, has worn the surgical shoe for 9-10 days but not consistant,     77 y.o. female presents for follow-up of left hallux wound. . . Relates wound doing better. . Right third toe doing well. Has been wearing the surgical shoe more but not consistently.  Denies any other pedal complaints. Denies n/v/f/c.   Past Medical History:  Diagnosis Date   Acute pulmonary embolism (HCC) 06/2013   Arthritis    Asthma    years ago    Clotting disorder (HCC)    DVT/PE   Diverticulosis    minor    Dyslipidemia    Esophageal reflux    Hypertension    Hypothyroidism    Numbness    TOES   Obesity    OSA (obstructive sleep apnea)    severe OSA with an AHI of 51/hr and nocturnal hypoxemia with O2 sats at low as 69%.   Paroxysmal atrial fibrillation (HCC)    1 isolated episode in the setting of acute stress with no further episodes.    Pseudogout     Objective:  Physical Exam: Vascular: DP/PT pulses 2/4 bilateral. CFT <3 seconds. Normal hair growth on digits. No edema.  Skin. No lacerations or abrasions bilateral feet. Right third digit nail healing well lateral border. Left hallux ulcer opened measures 0.2 cm x 0.3 cm x 0.2 cm   No erythema edema or purulence noted.  Musculoskeletal: MMT 5/5 bilateral lower extremities in DF, PF, Inversion and Eversion. Deceased ROM in DF of ankle joint. Hallux malleus noted.  Neurological: Sensation intact to light touch.   Assessment:   1. Ulcer of great toe, left, with fat layer exposed (HCC)   2. Neuropathy             Plan:  Patient was evaluated and treated and all questions answered. Ulcer left hallux wound limited to breakdown of skin  -Ulcer debrided procedure below.   -Dressed with  offloading pads.  -Off-loading with aperture pads.Discussed wearing surgical shoe.  -Discussed avoiding any pedicures and staying off of this foot as much as possible.  -Discussed glucose control and proper protein-rich diet.  -Discussed if any worsening redness, pain, fever or chills to call or may need to report to the emergency room. Patient expressed understanding.  -Discussed possible surgery to straighten hallux hammertoe to prevent future ulceration. Did discuss she is in PT now to work on ankle ROM and gait and we will see if this improves the ulceration.   Next visit discussed ingrown nail procedure as nail continues to be bothersome.   Procedure: Excisional Debridement of Wound Rationale: Removal of non-viable soft tissue from the wound to promote healing.  Anesthesia: none Pre-Debridement Wound Measurements: Overlying callus  Post-Debridement Wound Measurements: 0.2 cm x 0.3 cm x 0.2 cm  Type of Debridement: Sharp Excisional Tissue Removed: Non-viable soft tissue Depth of Debridement: subcutaneous tissue. Technique: Sharp excisional debridement to bleeding, viable wound base.  Dressing: Dry, sterile, compression dressing. Disposition: Patient tolerated procedure well. Patient to return in 2 week for follow-up.  Toe was evaluated and appears to be healing well.  May discontinue soaks and neosporin.  Patient to follow-up as needed.     Return in about 2  weeks (around 03/22/2023) for wound check.       Return in about 2 weeks (around 03/22/2023) for wound check.   Louann Sjogren, DPM

## 2023-03-23 ENCOUNTER — Ambulatory Visit (INDEPENDENT_AMBULATORY_CARE_PROVIDER_SITE_OTHER): Payer: Medicare Other | Admitting: Podiatry

## 2023-03-23 ENCOUNTER — Encounter: Payer: Self-pay | Admitting: Podiatry

## 2023-03-23 DIAGNOSIS — L97522 Non-pressure chronic ulcer of other part of left foot with fat layer exposed: Secondary | ICD-10-CM

## 2023-03-23 DIAGNOSIS — G629 Polyneuropathy, unspecified: Secondary | ICD-10-CM | POA: Diagnosis not present

## 2023-03-23 NOTE — Progress Notes (Signed)
  Subjective:  Patient ID: Elizabeth Wang, female    DOB: 25-Sep-1946,   MRN: [id number]  No chief complaint on file.   77 y.o. female presents for follow-up of left hallux wound.  Relates wound doing ok. . Right third toe doing well. Has been wearing the surgical shoe more but not consistently.  [place]  Denies any other pedal complaints. Denies n/v/f/c.   Past Medical History:  Diagnosis Date   Acute pulmonary embolism (HCC) 06/2013   Arthritis    Asthma    years ago    Clotting disorder (HCC)    DVT/PE   Diverticulosis    minor    Dyslipidemia    Esophageal reflux    Hypertension    Hypothyroidism    Numbness    TOES   Obesity    OSA (obstructive sleep apnea)    severe OSA with an AHI of 51/hr and nocturnal hypoxemia with O2 sats at low as 69%.   Paroxysmal atrial fibrillation (HCC)    1 isolated episode in the setting of acute stress with no further episodes.    Pseudogout     Objective:  Physical Exam: Vascular: DP/PT pulses 2/4 bilateral. CFT <3 seconds. Normal hair growth on digits. No edema.  Skin. No lacerations or abrasions bilateral feet. Right third digit nail healing well lateral border. Left hallux ulcer opened measures 0.3 cm x 0.3 cm x 0.2 cm   No erythema edema or purulence noted.  Musculoskeletal: MMT 5/5 bilateral lower extremities in DF, PF, Inversion and Eversion. Deceased ROM in DF of ankle joint. Hallux malleus noted.  Neurological: Sensation intact to light touch.   Assessment:   1. Ulcer of great toe, left, with fat layer exposed (HCC)   2. Neuropathy              Plan:  Patient was evaluated and treated and all questions answered. Ulcer left hallux wound limited to breakdown of skin  -Ulcer debrided procedure below.   -Dressed with offloading pads.  -Off-loading with aperture pads.Discussed wearing surgical shoe.  -Discussed avoiding any pedicures and staying off of this foot as much as possible.  -Discussed  glucose control and proper protein-rich diet.  -Discussed if any worsening redness, pain, fever or chills to call or may need to report to the emergency room. Patient expressed understanding.  -Discussed possible surgery to straighten hallux hammertoe to prevent future ulceration. Did discuss she is in PT now to work on ankle ROM and gait and we will see if this improves the ulceration.    Procedure: Excisional Debridement of Wound Rationale: Removal of non-viable soft tissue from the wound to promote healing.  Anesthesia: none Pre-Debridement Wound Measurements: Overlying callus  Post-Debridement Wound Measurements: 0.3 cm x 0.3 cm x 0.2 cm  Type of Debridement: Sharp Excisional Tissue Removed: Non-viable soft tissue Depth of Debridement: subcutaneous tissue. Technique: Sharp excisional debridement to bleeding, viable wound base.  Dressing: Dry, sterile, compression dressing. Disposition: Patient tolerated procedure well. Patient to return in 2 week for follow-up.     No follow-ups on file.       No follow-ups on file.   Louann Sjogren, DPM

## 2023-04-05 DIAGNOSIS — H9203 Otalgia, bilateral: Secondary | ICD-10-CM | POA: Diagnosis not present

## 2023-04-05 DIAGNOSIS — H6123 Impacted cerumen, bilateral: Secondary | ICD-10-CM | POA: Diagnosis not present

## 2023-04-06 ENCOUNTER — Ambulatory Visit (INDEPENDENT_AMBULATORY_CARE_PROVIDER_SITE_OTHER): Payer: Medicare Other | Admitting: Podiatry

## 2023-04-06 ENCOUNTER — Encounter: Payer: Self-pay | Admitting: Podiatry

## 2023-04-06 DIAGNOSIS — L97522 Non-pressure chronic ulcer of other part of left foot with fat layer exposed: Secondary | ICD-10-CM

## 2023-04-06 DIAGNOSIS — G629 Polyneuropathy, unspecified: Secondary | ICD-10-CM

## 2023-04-06 NOTE — Progress Notes (Signed)
  Subjective:  Patient ID: Elizabeth Wang, female    DOB: [birth year],   MRN: [id number]  Chief Complaint  Patient presents with   Wound Check    Ulcer left great toe patient states there has been some drainage but other than that she states she thinks her toe is doing much better    77 y.o. female presents for follow-up of left hallux wound.  Relates wound doing ok. . Right third toe doing well. Has been wearing the surgical shoe more but not consistently.  [place]  Denies any other pedal complaints. Denies n/v/f/c.   Past Medical History:  Diagnosis Date   Acute pulmonary embolism (HCC) 06/2013   Arthritis    Asthma    years ago    Clotting disorder (HCC)    DVT/PE   Diverticulosis    minor    Dyslipidemia    Esophageal reflux    Hypertension    Hypothyroidism    Numbness    TOES   Obesity    OSA (obstructive sleep apnea)    severe OSA with an AHI of 51/hr and nocturnal hypoxemia with O2 sats at low as 69%.   Paroxysmal atrial fibrillation (HCC)    1 isolated episode in the setting of acute stress with no further episodes.    Pseudogout     Objective:  Physical Exam: Vascular: DP/PT pulses 2/4 bilateral. CFT <3 seconds. Normal hair growth on digits. No edema.  Skin. No lacerations or abrasions bilateral feet. Right third digit nail healing well lateral border. Left hallux ulcer opened measures 0.2 cm x 0.3 cm x 0.2 cm   No erythema edema or purulence noted.  Musculoskeletal: MMT 5/5 bilateral lower extremities in DF, PF, Inversion and Eversion. Deceased ROM in DF of ankle joint. Hallux malleus noted.  Neurological: Sensation intact to light touch.   Assessment:   1. Ulcer of great toe, left, with fat layer exposed (HCC)   2. Neuropathy               Plan:  Patient was evaluated and treated and all questions answered. Ulcer left hallux wound limited to breakdown of skin  -Ulcer debrided procedure below.   -Dressed with offloading  pads.  -Off-loading with aperture pads.Discussed wearing surgical shoe.  -Discussed avoiding any pedicures and staying off of this foot as much as possible.  -Discussed glucose control and proper protein-rich diet.  -Discussed if any worsening redness, pain, fever or chills to call or may need to report to the emergency room. Patient expressed understanding.  -Discussed possible surgery to straighten hallux hammertoe to prevent future ulceration. Did discuss she is in PT now to work on ankle ROM and gait and we will see if this improves the ulceration.    Procedure: Excisional Debridement of Wound Rationale: Removal of non-viable soft tissue from the wound to promote healing.  Anesthesia: none Pre-Debridement Wound Measurements: Overlying callus  Post-Debridement Wound Measurements: 0.2 cm x 0.3 cm x 0.2 cm  Type of Debridement: Sharp Excisional Tissue Removed: Non-viable soft tissue Depth of Debridement: subcutaneous tissue. Technique: Sharp excisional debridement to bleeding, viable wound base.  Dressing: Dry, sterile, compression dressing. Disposition: Patient tolerated procedure well. Patient to return in 2 week for follow-up.     No follow-ups on file.       No follow-ups on file.   Louann Sjogren, DPM

## 2023-04-26 ENCOUNTER — Encounter: Payer: Self-pay | Admitting: Podiatry

## 2023-04-26 ENCOUNTER — Ambulatory Visit (INDEPENDENT_AMBULATORY_CARE_PROVIDER_SITE_OTHER): Payer: Medicare Other | Admitting: Podiatry

## 2023-04-26 DIAGNOSIS — L97522 Non-pressure chronic ulcer of other part of left foot with fat layer exposed: Secondary | ICD-10-CM

## 2023-04-26 DIAGNOSIS — G629 Polyneuropathy, unspecified: Secondary | ICD-10-CM

## 2023-04-26 NOTE — Progress Notes (Signed)
  Subjective:  Patient ID: Elizabeth Wang, female    DOB: 1946/08/24,   MRN: [id number]  No chief complaint on file.   77 y.o. female presents for follow-up of left hallux wound.  Relates wound doing a bit worse as it has been three weeks since coming in. . Right third toe doing well. Has been wearing the surgical shoe more but not consistently.  Has been fairly active.  Denies any other pedal complaints. Denies n/v/f/c.   Past Medical History:  Diagnosis Date   Acute pulmonary embolism (HCC) 06/2013   Arthritis    Asthma    years ago    Clotting disorder (HCC)    DVT/PE   Diverticulosis    minor    Dyslipidemia    Esophageal reflux    Hypertension    Hypothyroidism    Numbness    TOES   Obesity    OSA (obstructive sleep apnea)    severe OSA with an AHI of 51/hr and nocturnal hypoxemia with O2 sats at low as 69%.   Paroxysmal atrial fibrillation (HCC)    1 isolated episode in the setting of acute stress with no further episodes.    Pseudogout     Objective:  Physical Exam: Vascular: DP/PT pulses 2/4 bilateral. CFT <3 seconds. Normal hair growth on digits. No edema.  Skin. No lacerations or abrasions bilateral feet. Right third digit nail healing well lateral border. Left hallux ulcer opened measures 0.2 cm x 0.3 cm x 0.2 cm   No erythema edema or purulence noted.  Musculoskeletal: MMT 5/5 bilateral lower extremities in DF, PF, Inversion and Eversion. Deceased ROM in DF of ankle joint. Hallux malleus noted.  Neurological: Sensation intact to light touch.   Assessment:   1. Ulcer of great toe, left, with fat layer exposed (HCC)   2. Neuropathy               Plan:  Patient was evaluated and treated and all questions answered. Ulcer left hallux wound limited to breakdown of skin  -Ulcer debrided procedure below.   -Dressed with offloading pads.  -Off-loading with aperture pads.Discussed wearing surgical shoe.  -Discussed avoiding any pedicures and staying off of  this foot as much as possible.  -Discussed glucose control and proper protein-rich diet.  -Discussed if any worsening redness, pain, fever or chills to call or may need to report to the emergency room. Patient expressed understanding.  -Discussed possible surgery to straighten hallux hammertoe to prevent future ulceration. Did discuss she is in PT now to work on ankle ROM and gait and we will see if this improves the ulceration.    Procedure: Excisional Debridement of Wound Rationale: Removal of non-viable soft tissue from the wound to promote healing.  Anesthesia: none Pre-Debridement Wound Measurements: Overlying callus  Post-Debridement Wound Measurements: 0.2 cm x 0.3 cm x 0.2 cm  Type of Debridement: Sharp Excisional Tissue Removed: Non-viable soft tissue Depth of Debridement: subcutaneous tissue. Technique: Sharp excisional debridement to bleeding, viable wound base.  Dressing: Dry, sterile, compression dressing. Disposition: Patient tolerated procedure well. Patient to return in 2 week for follow-up.     No follow-ups on file.       No follow-ups on file.   Louann Sjogren, DPM

## 2023-05-10 ENCOUNTER — Ambulatory Visit (INDEPENDENT_AMBULATORY_CARE_PROVIDER_SITE_OTHER): Payer: Medicare Other | Admitting: Podiatry

## 2023-05-10 ENCOUNTER — Encounter: Payer: Self-pay | Admitting: Podiatry

## 2023-05-10 DIAGNOSIS — G629 Polyneuropathy, unspecified: Secondary | ICD-10-CM | POA: Diagnosis not present

## 2023-05-10 DIAGNOSIS — L97522 Non-pressure chronic ulcer of other part of left foot with fat layer exposed: Secondary | ICD-10-CM | POA: Diagnosis not present

## 2023-05-10 NOTE — Progress Notes (Signed)
  Subjective:  Patient ID: Elizabeth Wang, female    DOB: [birth year],   MRN: [id number]  Chief Complaint  Patient presents with   Wound Check    Pt stated that she has good days and bad ones     77 y.o. female presents for follow-up of left hallux wound.  Relates wound doing well has been wearing the shoe more often . Right third toe doing well.  Has been fairly active.  Denies any other pedal complaints. Denies n/v/f/c.   Past Medical History:  Diagnosis Date   Acute pulmonary embolism (HCC) 06/2013   Arthritis    Asthma    years ago    Clotting disorder (HCC)    DVT/PE   Diverticulosis    minor    Dyslipidemia    Esophageal reflux    Hypertension    Hypothyroidism    Numbness    TOES   Obesity    OSA (obstructive sleep apnea)    severe OSA with an AHI of 51/hr and nocturnal hypoxemia with O2 sats at low as 69%.   Paroxysmal atrial fibrillation (HCC)    1 isolated episode in the setting of acute stress with no further episodes.    Pseudogout     Objective:  Physical Exam: Vascular: DP/PT pulses 2/4 bilateral. CFT <3 seconds. Normal hair growth on digits. No edema.  Skin. No lacerations or abrasions bilateral feet. Right third digit nail healing well lateral border. Left hallux ulcer opened measures 0.2 cm x 0.2 cm x 0.2 cm   No erythema edema or purulence noted.  Musculoskeletal: MMT 5/5 bilateral lower extremities in DF, PF, Inversion and Eversion. Deceased ROM in DF of ankle joint. Hallux malleus noted.  Neurological: Sensation intact to light touch.   Assessment:   1. Ulcer of great toe, left, with fat layer exposed (HCC)   2. Neuropathy                Plan:  Patient was evaluated and treated and all questions answered. Ulcer left hallux wound limited to breakdown of skin  -Ulcer debrided procedure below.   -Dressed with offloading pads.  -Off-loading with aperture pads.Discussed wearing surgical shoe.  -Discussed avoiding any pedicures and staying off  of this foot as much as possible.  -Discussed glucose control and proper protein-rich diet.  -Discussed if any worsening redness, pain, fever or chills to call or may need to report to the emergency room. Patient expressed understanding.  -Discussed possible surgery to straighten hallux hammertoe to prevent future ulceration. Did discuss she is in PT now to work on ankle ROM and gait and we will continue this.    Procedure: Excisional Debridement of Wound Rationale: Removal of non-viable soft tissue from the wound to promote healing.  Anesthesia: none Pre-Debridement Wound Measurements: Overlying callus  Post-Debridement Wound Measurements: 0.2 cm x 0.2 cm x 0.2 cm  Type of Debridement: Sharp Excisional Tissue Removed: Non-viable soft tissue Depth of Debridement: subcutaneous tissue. Technique: Sharp excisional debridement to bleeding, viable wound base.  Dressing: Dry, sterile, compression dressing. Disposition: Patient tolerated procedure well. Patient to return in 2 week for follow-up.     Return in about 2 weeks (around 05/24/2023) for wound check.       Return in about 2 weeks (around 05/24/2023) for wound check.   Louann Sjogren, DPM

## 2023-05-18 ENCOUNTER — Other Ambulatory Visit: Payer: Self-pay

## 2023-05-18 DIAGNOSIS — I4819 Other persistent atrial fibrillation: Secondary | ICD-10-CM

## 2023-05-18 MED ORDER — RIVAROXABAN 20 MG PO TABS: 20.0000 mg | ORAL_TABLET | Freq: Every day | ORAL | 1 refills | Status: DC

## 2023-05-18 NOTE — Telephone Encounter (Signed)
Prescription refill request for Xarelto received.  Indication: Afib  Last office visit: 03/08/23 Shari Prows) Weight: 102.6kg  Age:77 Scr:  1.07 (01/06/23 via [facility])  CrCl: 71.67ml/min  Appropriate dose. Refill sent.

## 2023-05-24 ENCOUNTER — Encounter: Payer: Self-pay | Admitting: Podiatry

## 2023-05-24 ENCOUNTER — Ambulatory Visit (INDEPENDENT_AMBULATORY_CARE_PROVIDER_SITE_OTHER): Payer: Medicare Other | Admitting: Podiatry

## 2023-05-24 DIAGNOSIS — G629 Polyneuropathy, unspecified: Secondary | ICD-10-CM

## 2023-05-24 DIAGNOSIS — L97522 Non-pressure chronic ulcer of other part of left foot with fat layer exposed: Secondary | ICD-10-CM | POA: Diagnosis not present

## 2023-05-24 NOTE — Progress Notes (Signed)
  Subjective:  Patient ID: Elizabeth Wang, female    DOB: 1945-11-28,   MRN: [id number]  No chief complaint on file.   77 y.o. female presents for follow-up of left hallux wound.  Relates wound doing well has been wearing the shoe more often . Right third toe doing well.  Has been fairly active.  Denies any other pedal complaints. Denies n/v/f/c.   Past Medical History:  Diagnosis Date   Acute pulmonary embolism (HCC) 06/2013   Arthritis    Asthma    years ago    Clotting disorder (HCC)    DVT/PE   Diverticulosis    minor    Dyslipidemia    Esophageal reflux    Hypertension    Hypothyroidism    Numbness    TOES   Obesity    OSA (obstructive sleep apnea)    severe OSA with an AHI of 51/hr and nocturnal hypoxemia with O2 sats at low as 69%.   Paroxysmal atrial fibrillation (HCC)    1 isolated episode in the setting of acute stress with no further episodes.    Pseudogout     Objective:  Physical Exam: Vascular: DP/PT pulses 2/4 bilateral. CFT <3 seconds. Normal hair growth on digits. No edema.  Skin. No lacerations or abrasions bilateral feet. Right third digit nail healing well lateral border. Left hallux ulcer opened measures 0.2 cm x 0.2 cm x 0.2 cm   No erythema edema or purulence noted.  Musculoskeletal: MMT 5/5 bilateral lower extremities in DF, PF, Inversion and Eversion. Deceased ROM in DF of ankle joint. Hallux malleus noted.  Neurological: Sensation intact to light touch.   Assessment:   1. Ulcer of great toe, left, with fat layer exposed (HCC)   2. Neuropathy                 Plan:  Patient was evaluated and treated and all questions answered. Ulcer left hallux wound limited to breakdown of skin  -Ulcer debrided procedure below.   -Dressed with offloading pads.  -Off-loading with aperture pads.Discussed wearing surgical shoe.  -Discussed avoiding any pedicures and staying off of this foot as much as possible.  -Discussed glucose control and proper  protein-rich diet.  -Discussed if any worsening redness, pain, fever or chills to call or may need to report to the emergency room. Patient expressed understanding.  -Discussed possible surgery to straighten hallux hammertoe to prevent future ulceration. Did discuss she is in PT now to work on ankle ROM and gait and we will continue this.    Procedure: Excisional Debridement of Wound Rationale: Removal of non-viable soft tissue from the wound to promote healing.  Anesthesia: none Pre-Debridement Wound Measurements: Overlying callus  Post-Debridement Wound Measurements: 0.2 cm x 0.2 cm x 0.2 cm  Type of Debridement: Sharp Excisional Tissue Removed: Non-viable soft tissue Depth of Debridement: subcutaneous tissue. Technique: Sharp excisional debridement to bleeding, viable wound base.  Dressing: Dry, sterile, compression dressing. Disposition: Patient tolerated procedure well. Patient to return in 2 week for follow-up.     No follow-ups on file.       No follow-ups on file.   Louann Sjogren, DPM

## 2023-06-07 ENCOUNTER — Ambulatory Visit: Payer: Medicare Other | Admitting: Podiatry

## 2023-06-07 DIAGNOSIS — L97522 Non-pressure chronic ulcer of other part of left foot with fat layer exposed: Secondary | ICD-10-CM

## 2023-06-07 DIAGNOSIS — G629 Polyneuropathy, unspecified: Secondary | ICD-10-CM

## 2023-06-07 NOTE — Progress Notes (Signed)
  Subjective:  Patient ID: Elizabeth Wang, female    DOB: September 16, 1946,   MRN: [id number]  Chief Complaint  Patient presents with   Wound Check    DOING OK, LITTLE TO NO DRAINAGE, [place], DENIES N/V/F/C/SOB LOOK AT LEFT 5TH TOE POSSIBLE CORN     77 y.o. female presents for follow-up of left hallux wound.  Relates wound doing well has been wearing the shoe  off and on . Right third toe doing well.  Has been fairly active.  Denies any other pedal complaints. Denies n/v/f/c.   Past Medical History:  Diagnosis Date   Acute pulmonary embolism (HCC) 06/2013   Arthritis    Asthma    years ago    Clotting disorder (HCC)    DVT/PE   Diverticulosis    minor    Dyslipidemia    Esophageal reflux    Hypertension    Hypothyroidism    Numbness    TOES   Obesity    OSA (obstructive sleep apnea)    severe OSA with an AHI of 51/hr and nocturnal hypoxemia with O2 sats at low as 69%.   Paroxysmal atrial fibrillation (HCC)    1 isolated episode in the setting of acute stress with no further episodes.    Pseudogout     Objective:  Physical Exam: Vascular: DP/PT pulses 2/4 bilateral. CFT <3 seconds. Normal hair growth on digits. No edema.  Skin. No lacerations or abrasions bilateral feet. Right third digit nail healing well lateral border. Left hallux ulcer opened measures 0.2 cm x 0.2 cm x 0.2 cm   No erythema edema or purulence noted.  Musculoskeletal: MMT 5/5 bilateral lower extremities in DF, PF, Inversion and Eversion. Deceased ROM in DF of ankle joint. Hallux malleus noted.  Neurological: Sensation intact to light touch.   Assessment:   No diagnosis found.               Plan:  Patient was evaluated and treated and all questions answered. Ulcer left hallux wound limited to breakdown of skin  -Ulcer debrided procedure below.   -Dressed with offloading pads.  -Off-loading with aperture pads.Discussed wearing surgical shoe.   -Discussed avoiding any pedicures and staying off of this foot as much as possible.  -Discussed glucose control and proper protein-rich diet.  -Discussed if any worsening redness, pain, fever or chills to call or may need to report to the emergency room. Patient expressed understanding.  -Discussed possible surgery to straighten hallux hammertoe to prevent future ulceration. Did discuss she is in PT now to work on ankle ROM and gait and we will continue this.    Procedure: Excisional Debridement of Wound Rationale: Removal of non-viable soft tissue from the wound to promote healing.  Anesthesia: none Pre-Debridement Wound Measurements: Overlying callus  Post-Debridement Wound Measurements: 0.2 cm x 0.2 cm x 0.2 cm  Type of Debridement: Sharp Excisional Tissue Removed: Non-viable soft tissue Depth of Debridement: subcutaneous tissue. Technique: Sharp excisional debridement to bleeding, viable wound base.  Dressing: Dry, sterile, compression dressing. Disposition: Patient tolerated procedure well. Patient to return in 2 week for follow-up.     No follow-ups on file.       No follow-ups on file.   Louann Sjogren, DPM

## 2023-06-09 DIAGNOSIS — I129 Hypertensive chronic kidney disease with stage 1 through stage 4 chronic kidney disease, or unspecified chronic kidney disease: Secondary | ICD-10-CM | POA: Diagnosis not present

## 2023-06-09 DIAGNOSIS — I2510 Atherosclerotic heart disease of native coronary artery without angina pectoris: Secondary | ICD-10-CM | POA: Diagnosis not present

## 2023-06-09 DIAGNOSIS — N1831 Chronic kidney disease, stage 3a: Secondary | ICD-10-CM | POA: Diagnosis not present

## 2023-06-09 DIAGNOSIS — R7301 Impaired fasting glucose: Secondary | ICD-10-CM | POA: Diagnosis not present

## 2023-06-09 DIAGNOSIS — L570 Actinic keratosis: Secondary | ICD-10-CM | POA: Diagnosis not present

## 2023-06-09 DIAGNOSIS — E785 Hyperlipidemia, unspecified: Secondary | ICD-10-CM | POA: Diagnosis not present

## 2023-06-09 DIAGNOSIS — L821 Other seborrheic keratosis: Secondary | ICD-10-CM | POA: Diagnosis not present

## 2023-06-09 DIAGNOSIS — E039 Hypothyroidism, unspecified: Secondary | ICD-10-CM | POA: Diagnosis not present

## 2023-06-09 DIAGNOSIS — D225 Melanocytic nevi of trunk: Secondary | ICD-10-CM | POA: Diagnosis not present

## 2023-06-16 DIAGNOSIS — E785 Hyperlipidemia, unspecified: Secondary | ICD-10-CM | POA: Diagnosis not present

## 2023-06-16 DIAGNOSIS — I10 Essential (primary) hypertension: Secondary | ICD-10-CM | POA: Diagnosis not present

## 2023-06-16 DIAGNOSIS — N1831 Chronic kidney disease, stage 3a: Secondary | ICD-10-CM | POA: Diagnosis not present

## 2023-06-16 DIAGNOSIS — Z0000 Encounter for general adult medical examination without abnormal findings: Secondary | ICD-10-CM | POA: Diagnosis not present

## 2023-06-16 DIAGNOSIS — L97521 Non-pressure chronic ulcer of other part of left foot limited to breakdown of skin: Secondary | ICD-10-CM | POA: Diagnosis not present

## 2023-06-16 DIAGNOSIS — E039 Hypothyroidism, unspecified: Secondary | ICD-10-CM | POA: Diagnosis not present

## 2023-06-16 DIAGNOSIS — R82998 Other abnormal findings in urine: Secondary | ICD-10-CM | POA: Diagnosis not present

## 2023-06-16 DIAGNOSIS — Z86711 Personal history of pulmonary embolism: Secondary | ICD-10-CM | POA: Diagnosis not present

## 2023-06-16 DIAGNOSIS — I480 Paroxysmal atrial fibrillation: Secondary | ICD-10-CM | POA: Diagnosis not present

## 2023-06-16 DIAGNOSIS — R7301 Impaired fasting glucose: Secondary | ICD-10-CM | POA: Diagnosis not present

## 2023-06-16 DIAGNOSIS — Z1331 Encounter for screening for depression: Secondary | ICD-10-CM | POA: Diagnosis not present

## 2023-06-16 DIAGNOSIS — I2510 Atherosclerotic heart disease of native coronary artery without angina pectoris: Secondary | ICD-10-CM | POA: Diagnosis not present

## 2023-06-16 DIAGNOSIS — I872 Venous insufficiency (chronic) (peripheral): Secondary | ICD-10-CM | POA: Diagnosis not present

## 2023-06-16 DIAGNOSIS — D6869 Other thrombophilia: Secondary | ICD-10-CM | POA: Diagnosis not present

## 2023-06-16 DIAGNOSIS — Z1339 Encounter for screening examination for other mental health and behavioral disorders: Secondary | ICD-10-CM | POA: Diagnosis not present

## 2023-06-16 DIAGNOSIS — I129 Hypertensive chronic kidney disease with stage 1 through stage 4 chronic kidney disease, or unspecified chronic kidney disease: Secondary | ICD-10-CM | POA: Diagnosis not present

## 2023-06-16 DIAGNOSIS — Z23 Encounter for immunization: Secondary | ICD-10-CM | POA: Diagnosis not present

## 2023-06-21 ENCOUNTER — Ambulatory Visit: Payer: Medicare Other | Admitting: Podiatry

## 2023-06-23 ENCOUNTER — Encounter: Payer: Self-pay | Admitting: Podiatry

## 2023-06-23 ENCOUNTER — Ambulatory Visit: Payer: Medicare Other | Admitting: Podiatry

## 2023-06-23 DIAGNOSIS — L97522 Non-pressure chronic ulcer of other part of left foot with fat layer exposed: Secondary | ICD-10-CM

## 2023-06-23 DIAGNOSIS — G629 Polyneuropathy, unspecified: Secondary | ICD-10-CM

## 2023-06-23 NOTE — Progress Notes (Signed)
  Subjective:  Patient ID: Elizabeth Wang, female    DOB: January 28, 1946,   MRN: [id number]  No chief complaint on file.   77 y.o. female presents for follow-up of left hallux wound.  Relates wound doing well has been wearing the shoe  off and on . Right third toe doing well.  Relate improvement.  Denies any other pedal complaints. Denies n/v/f/c.   Past Medical History:  Diagnosis Date   Acute pulmonary embolism (HCC) 06/2013   Arthritis    Asthma    years ago    Clotting disorder (HCC)    DVT/PE   Diverticulosis    minor    Dyslipidemia    Esophageal reflux    Hypertension    Hypothyroidism    Numbness    TOES   Obesity    OSA (obstructive sleep apnea)    severe OSA with an AHI of 51/hr and nocturnal hypoxemia with O2 sats at low as 69%.   Paroxysmal atrial fibrillation (HCC)    1 isolated episode in the setting of acute stress with no further episodes.    Pseudogout     Objective:  Physical Exam: Vascular: DP/PT pulses 2/4 bilateral. CFT <3 seconds. Normal hair growth on digits. No edema.  Skin. No lacerations or abrasions bilateral feet. Right third digit nail healing well lateral border. Left hallux ulcer opened measures 0.2 cm x 0.1 cm x 0.2 cm   No erythema edema or purulence noted.  Musculoskeletal: MMT 5/5 bilateral lower extremities in DF, PF, Inversion and Eversion. Deceased ROM in DF of ankle joint. Hallux malleus noted.  Neurological: Sensation intact to light touch.   Assessment:   1. Ulcer of great toe, left, with fat layer exposed (HCC)   2. Neuropathy                  Plan:  Patient was evaluated and treated and all questions answered. Ulcer left hallux wound limited to breakdown of skin  -Ulcer debrided procedure below.   -Dressed with offloading pads.  -Off-loading with aperture pads.Discussed wearing surgical shoe.  -Discussed avoiding any pedicures and staying off of this foot as much as possible.  -Discussed glucose control and proper  protein-rich diet.  -Discussed if any worsening redness, pain, fever or chills to call or may need to report to the emergency room. Patient expressed understanding.  -Discussed possible surgery to straighten hallux hammertoe to prevent future ulceration. Did discuss she is in PT now to work on ankle ROM and gait and we will continue this.    Procedure: Excisional Debridement of Wound Rationale: Removal of non-viable soft tissue from the wound to promote healing.  Anesthesia: none Pre-Debridement Wound Measurements: Overlying callus  Post-Debridement Wound Measurements: 0.2 cm x 0.1 cm x 0.2 cm  Type of Debridement: Sharp Excisional Tissue Removed: Non-viable soft tissue Depth of Debridement: subcutaneous tissue. Technique: Sharp excisional debridement to bleeding, viable wound base.  Dressing: Dry, sterile, compression dressing. Disposition: Patient tolerated procedure well. Patient to return in 2 week for follow-up.     Return in about 2 weeks (around 07/07/2023) for wound check.       Return in about 2 weeks (around 07/07/2023) for wound check.   Louann Sjogren, DPM

## 2023-07-05 ENCOUNTER — Ambulatory Visit (INDEPENDENT_AMBULATORY_CARE_PROVIDER_SITE_OTHER): Payer: Medicare Other | Admitting: Podiatry

## 2023-07-05 ENCOUNTER — Encounter: Payer: Self-pay | Admitting: Cardiovascular Disease

## 2023-07-05 ENCOUNTER — Encounter: Payer: Self-pay | Admitting: Podiatry

## 2023-07-05 DIAGNOSIS — G629 Polyneuropathy, unspecified: Secondary | ICD-10-CM

## 2023-07-05 DIAGNOSIS — L97522 Non-pressure chronic ulcer of other part of left foot with fat layer exposed: Secondary | ICD-10-CM

## 2023-07-05 NOTE — Progress Notes (Signed)
  Subjective:  Patient ID: Elizabeth Wang, female    DOB: 29-Aug-1946,   MRN: [id number]  No chief complaint on file.   77 y.o. female presents for follow-up of left hallux wound.  Relates wound doing well has been wearing the shoe  off and on . Right third toe doing well.  Relate improvement.  Denies any other pedal complaints. Denies n/v/f/c.   Past Medical History:  Diagnosis Date   Acute pulmonary embolism (HCC) 06/2013   Arthritis    Asthma    years ago    Clotting disorder (HCC)    DVT/PE   Diverticulosis    minor    Dyslipidemia    Esophageal reflux    Hypertension    Hypothyroidism    Numbness    TOES   Obesity    OSA (obstructive sleep apnea)    severe OSA with an AHI of 51/hr and nocturnal hypoxemia with O2 sats at low as 69%.   Paroxysmal atrial fibrillation (HCC)    1 isolated episode in the setting of acute stress with no further episodes.    Pseudogout     Objective:  Physical Exam: Vascular: DP/PT pulses 2/4 bilateral. CFT <3 seconds. Normal hair growth on digits. No edema.  Skin. No lacerations or abrasions bilateral feet. Right third digit nail healing well lateral border. Left hallux ulcer opened measures 0.2 cm x 0.1 cm x 0.2 cm   No erythema edema or purulence noted.  Musculoskeletal: MMT 5/5 bilateral lower extremities in DF, PF, Inversion and Eversion. Deceased ROM in DF of ankle joint. Hallux malleus noted.  Neurological: Sensation intact to light touch.   Assessment:   1. Ulcer of great toe, left, with fat layer exposed (HCC)   2. Neuropathy                   Plan:  Patient was evaluated and treated and all questions answered. Ulcer left hallux wound limited to breakdown of skin  -Ulcer debrided procedure below.   -Dressed with offloading pads.  -Off-loading with aperture pads.Discussed wearing surgical shoe.  -Discussed avoiding any pedicures and staying off of this foot as much as possible.  -Discussed glucose control and proper  protein-rich diet.  -Discussed if any worsening redness, pain, fever or chills to call or may need to report to the emergency room. Patient expressed understanding.  -Discussed possible surgery to straighten hallux hammertoe to prevent future ulceration. Did discuss she is in PT now to work on ankle ROM and gait and we will continue this.    Procedure: Excisional Debridement of Wound Rationale: Removal of non-viable soft tissue from the wound to promote healing.  Anesthesia: none Pre-Debridement Wound Measurements: Overlying callus  Post-Debridement Wound Measurements: 0.2 cm x 0.1 cm x 0.2 cm  Type of Debridement: Sharp Excisional Tissue Removed: Non-viable soft tissue Depth of Debridement: subcutaneous tissue. Technique: Sharp excisional debridement to bleeding, viable wound base.  Dressing: Dry, sterile, compression dressing. Disposition: Patient tolerated procedure well. Patient to return in 2 week for follow-up.     Return in about 2 weeks (around 07/19/2023) for wound check.       Return in about 2 weeks (around 07/19/2023) for wound check.   Louann Sjogren, DPM

## 2023-07-06 ENCOUNTER — Other Ambulatory Visit (HOSPITAL_BASED_OUTPATIENT_CLINIC_OR_DEPARTMENT_OTHER): Payer: Self-pay

## 2023-07-06 DIAGNOSIS — Z23 Encounter for immunization: Secondary | ICD-10-CM | POA: Diagnosis not present

## 2023-07-06 MED ORDER — COMIRNATY 30 MCG/0.3ML IM SUSY
0.3000 mL | PREFILLED_SYRINGE | Freq: Once | INTRAMUSCULAR | 0 refills | Status: AC
  Filled 2023-07-06: qty 0.3, 1d supply, fill #0

## 2023-07-06 MED ORDER — FLUAD 0.5 ML IM SUSY
0.5000 mL | PREFILLED_SYRINGE | Freq: Once | INTRAMUSCULAR | 0 refills | Status: AC
  Filled 2023-07-06: qty 0.5, 1d supply, fill #0

## 2023-07-07 DIAGNOSIS — M1991 Primary osteoarthritis, unspecified site: Secondary | ICD-10-CM | POA: Diagnosis not present

## 2023-07-07 DIAGNOSIS — M1120 Other chondrocalcinosis, unspecified site: Secondary | ICD-10-CM | POA: Diagnosis not present

## 2023-07-07 DIAGNOSIS — Z79899 Other long term (current) drug therapy: Secondary | ICD-10-CM | POA: Diagnosis not present

## 2023-07-07 DIAGNOSIS — Z6838 Body mass index (BMI) 38.0-38.9, adult: Secondary | ICD-10-CM | POA: Diagnosis not present

## 2023-07-07 DIAGNOSIS — E669 Obesity, unspecified: Secondary | ICD-10-CM | POA: Diagnosis not present

## 2023-07-13 ENCOUNTER — Ambulatory Visit: Payer: Medicare Other | Admitting: Podiatry

## 2023-07-20 ENCOUNTER — Ambulatory Visit: Payer: Medicare Other | Admitting: Podiatry

## 2023-07-21 ENCOUNTER — Ambulatory Visit (INDEPENDENT_AMBULATORY_CARE_PROVIDER_SITE_OTHER): Payer: Medicare Other | Admitting: Podiatry

## 2023-07-21 ENCOUNTER — Encounter: Payer: Self-pay | Admitting: Podiatry

## 2023-07-21 DIAGNOSIS — G629 Polyneuropathy, unspecified: Secondary | ICD-10-CM | POA: Diagnosis not present

## 2023-07-21 DIAGNOSIS — L97522 Non-pressure chronic ulcer of other part of left foot with fat layer exposed: Secondary | ICD-10-CM

## 2023-07-21 DIAGNOSIS — Z1231 Encounter for screening mammogram for malignant neoplasm of breast: Secondary | ICD-10-CM | POA: Diagnosis not present

## 2023-07-21 NOTE — Progress Notes (Signed)
Subjective:  Patient ID: Elizabeth Wang, female    DOB: 1945/11/03,   MRN: [id number]  Chief Complaint  Patient presents with   Foot Ulcer    Pt  presents for follow-up of left hallux wound. Pt states that the  wound doing well.    77 y.o. female presents for follow-up of left hallux wound.  Relates wound doing well has been wearing the shoe  off and on . Right third toe doing well.  Relate improvement.  Denies any other pedal complaints. Denies n/v/f/c.   Past Medical History:  Diagnosis Date   Acute pulmonary embolism (HCC) 06/2013   Arthritis    Asthma    years ago    Clotting disorder (HCC)    DVT/PE   Diverticulosis    minor    Dyslipidemia    Esophageal reflux    Hypertension    Hypothyroidism    Numbness    TOES   Obesity    OSA (obstructive sleep apnea)    severe OSA with an AHI of 51/hr and nocturnal hypoxemia with O2 sats at low as 69%.   Paroxysmal atrial fibrillation (HCC)    1 isolated episode in the setting of acute stress with no further episodes.    Pseudogout     Objective:  Physical Exam: Vascular: DP/PT pulses 2/4 bilateral. CFT <3 seconds. Normal hair growth on digits. No edema.  Skin. No lacerations or abrasions bilateral feet. Right third digit nail healing well lateral border. Left hallux ulcer opened measures 0.2 cm x 0.1 cm x 0.2 cm   No erythema edema or purulence noted.  Musculoskeletal: MMT 5/5 bilateral lower extremities in DF, PF, Inversion and Eversion. Deceased ROM in DF of ankle joint. Hallux malleus noted.  Neurological: Sensation intact to light touch.   Assessment:   1. Ulcer of great toe, left, with fat layer exposed (HCC)   2. Neuropathy                   Plan:  Patient was evaluated and treated and all questions answered. Ulcer left hallux wound limited to breakdown of skin  -Ulcer debrided procedure below.   -Dressed with offloading pads.  -Off-loading with aperture pads.Discussed wearing surgical shoe.   -Discussed avoiding any pedicures and staying off of this foot as much as possible.  -Discussed glucose control and proper protein-rich diet.  -Discussed if any worsening redness, pain, fever or chills to call or may need to report to the emergency room. Patient expressed understanding.  -Discussed possible surgery to straighten hallux hammertoe to prevent future ulceration. Did discuss she is in PT now to work on ankle ROM and gait and we will continue this.  -Will consider TCC cast in the future. Will discuss more.    Procedure: Excisional Debridement of Wound Rationale: Removal of non-viable soft tissue from the wound to promote healing.  Anesthesia: none Pre-Debridement Wound Measurements: Overlying callus  Post-Debridement Wound Measurements: 0.2 cm x 0.1 cm x 0.2 cm  Type of Debridement: Sharp Excisional Tissue Removed: Non-viable soft tissue Depth of Debridement: subcutaneous tissue. Technique: Sharp excisional debridement to bleeding, viable wound base.  Dressing: Dry, sterile, compression dressing. Disposition: Patient tolerated procedure well. Patient to return in 2 week for follow-up.     Return in about 2 weeks (around 08/04/2023) for wound check.       Return in about 2 weeks (around 08/04/2023) for wound check.   Louann Sjogren, DPM

## 2023-08-02 ENCOUNTER — Ambulatory Visit (INDEPENDENT_AMBULATORY_CARE_PROVIDER_SITE_OTHER): Payer: Medicare Other | Admitting: Podiatry

## 2023-08-02 DIAGNOSIS — G629 Polyneuropathy, unspecified: Secondary | ICD-10-CM

## 2023-08-02 DIAGNOSIS — L97522 Non-pressure chronic ulcer of other part of left foot with fat layer exposed: Secondary | ICD-10-CM

## 2023-08-02 NOTE — Progress Notes (Signed)
Subjective:  Patient ID: Elizabeth Wang, female    DOB: [birth year],   MRN: [id number]  Chief Complaint  Patient presents with   Wound Check    LEFT HALLUX IS HAVING DRAINAGE, NO PAIN DENIES N/V/F/C/SOB    77 y.o. female presents for follow-up of left hallux wound.  Relates doing well but on her feet a lot and has worsened some.  Right third toe doing well.  Relate improvement.  Denies any other pedal complaints. Denies n/v/f/c.   Past Medical History:  Diagnosis Date   Acute pulmonary embolism (HCC) 06/2013   Arthritis    Asthma    years ago    Clotting disorder (HCC)    DVT/PE   Diverticulosis    minor    Dyslipidemia    Esophageal reflux    Hypertension    Hypothyroidism    Numbness    TOES   Obesity    OSA (obstructive sleep apnea)    severe OSA with an AHI of 51/hr and nocturnal hypoxemia with O2 sats at low as 69%.   Paroxysmal atrial fibrillation (HCC)    1 isolated episode in the setting of acute stress with no further episodes.    Pseudogout     Objective:  Physical Exam: Vascular: DP/PT pulses 2/4 bilateral. CFT <3 seconds. Normal hair growth on digits. No edema.  Skin. No lacerations or abrasions bilateral feet. Right third digit nail healing well lateral border. Left hallux ulcer opened measures 0.2 cm x 0.4 cm x 0.2 cm   No erythema edema or purulence noted.  Musculoskeletal: MMT 5/5 bilateral lower extremities in DF, PF, Inversion and Eversion. Deceased ROM in DF of ankle joint. Hallux malleus noted.  Neurological: Sensation intact to light touch.   Assessment:   1. Ulcer of great toe, left, with fat layer exposed (HCC)   2. Neuropathy                   Plan:  Patient was evaluated and treated and all questions answered. Ulcer left hallux wound limited to breakdown of skin  -Ulcer debrided procedure below.   -Dressed with offloading pads.  -Off-loading with aperture pads.Discussed wearing surgical shoe.  -Discussed avoiding any pedicures  and staying off of this foot as much as possible.  -Discussed glucose control and proper protein-rich diet.  -Discussed if any worsening redness, pain, fever or chills to call or may need to report to the emergency room. Patient expressed understanding.  -Discussed possible surgery to straighten hallux hammertoe to prevent future ulceration. Discussed trying tenotomy for both digits next visit to see if this will aid in wound healing on left and pain on the right toe.    Procedure: Excisional Debridement of Wound Rationale: Removal of non-viable soft tissue from the wound to promote healing.  Anesthesia: none Pre-Debridement Wound Measurements: Overlying callus  Post-Debridement Wound Measurements: 0.2 cm x 0.4 cm x 0.2 cm  Type of Debridement: Sharp Excisional Tissue Removed: Non-viable soft tissue Depth of Debridement: subcutaneous tissue. Technique: Sharp excisional debridement to bleeding, viable wound base.  Dressing: Dry, sterile, compression dressing. Disposition: Patient tolerated procedure well. Patient to return in 2 week for follow-up.     No follow-ups on file.       No follow-ups on file.   Louann Sjogren, DPM

## 2023-08-16 ENCOUNTER — Ambulatory Visit: Payer: Medicare Other | Admitting: Podiatry

## 2023-08-18 ENCOUNTER — Encounter: Payer: Self-pay | Admitting: Podiatry

## 2023-08-18 ENCOUNTER — Ambulatory Visit (INDEPENDENT_AMBULATORY_CARE_PROVIDER_SITE_OTHER): Payer: Medicare Other | Admitting: Podiatry

## 2023-08-18 DIAGNOSIS — M2041 Other hammer toe(s) (acquired), right foot: Secondary | ICD-10-CM | POA: Diagnosis not present

## 2023-08-18 DIAGNOSIS — M2042 Other hammer toe(s) (acquired), left foot: Secondary | ICD-10-CM

## 2023-08-18 DIAGNOSIS — L97522 Non-pressure chronic ulcer of other part of left foot with fat layer exposed: Secondary | ICD-10-CM

## 2023-08-18 DIAGNOSIS — G629 Polyneuropathy, unspecified: Secondary | ICD-10-CM

## 2023-08-18 DIAGNOSIS — R5383 Other fatigue: Secondary | ICD-10-CM | POA: Diagnosis not present

## 2023-08-18 DIAGNOSIS — G4733 Obstructive sleep apnea (adult) (pediatric): Secondary | ICD-10-CM | POA: Diagnosis not present

## 2023-08-18 DIAGNOSIS — L84 Corns and callosities: Secondary | ICD-10-CM

## 2023-08-18 DIAGNOSIS — J3489 Other specified disorders of nose and nasal sinuses: Secondary | ICD-10-CM | POA: Diagnosis not present

## 2023-08-18 DIAGNOSIS — Z1152 Encounter for screening for COVID-19: Secondary | ICD-10-CM | POA: Diagnosis not present

## 2023-08-18 DIAGNOSIS — J029 Acute pharyngitis, unspecified: Secondary | ICD-10-CM | POA: Diagnosis not present

## 2023-08-18 NOTE — Progress Notes (Signed)
Subjective:  Patient ID: Elizabeth Wang, female    DOB: 1945/12/15,   MRN: [id number]  No chief complaint on file.   77 y.o. female presents for follow-up of left hallux wound.  Relates doing well but on her feet a lot and has worsened some. Here to have tenotomy for the rigth thir toe.  Denies any other pedal complaints. Denies n/v/f/c.   Past Medical History:  Diagnosis Date   Acute pulmonary embolism (HCC) 06/2013   Arthritis    Asthma    years ago    Clotting disorder (HCC)    DVT/PE   Diverticulosis    minor    Dyslipidemia    Esophageal reflux    Hypertension    Hypothyroidism    Numbness    TOES   Obesity    OSA (obstructive sleep apnea)    severe OSA with an AHI of 51/hr and nocturnal hypoxemia with O2 sats at low as 69%.   Paroxysmal atrial fibrillation (HCC)    1 isolated episode in the setting of acute stress with no further episodes.    Pseudogout     Objective:  Physical Exam: Vascular: DP/PT pulses 2/4 bilateral. CFT <3 seconds. Normal hair growth on digits. No edema.  Skin. No lacerations or abrasions bilateral feet. Right third digit nail healing well lateral border. Left hallux ulcer opened measures 0.2 cm x 0.4 cm x 0.2 cm   No erythema edema or purulence noted.  Musculoskeletal: MMT 5/5 bilateral lower extremities in DF, PF, Inversion and Eversion. Deceased ROM in DF of ankle joint. Hallux malleus noted.  Neurological: Sensation intact to light touch.   Assessment:   1. Ulcer of great toe, left, with fat layer exposed (HCC)   2. Neuropathy   3. Pre-ulcerative calluses   4. Hammertoe, bilateral                    Plan:  Patient was evaluated and treated and all questions answered. Ulcer left hallux wound limited to breakdown of skin  -Ulcer debrided procedure below.   -Dressed with offloading pads.  -Off-loading with aperture pads.Discussed wearing surgical shoe.  -Discussed avoiding any pedicures and staying off of this foot as much  as possible.  -Discussed glucose control and proper protein-rich diet.  -Discussed if any worsening redness, pain, fever or chills to call or may need to report to the emergency room. Patient expressed understanding.  -Discussed possible surgery to straighten hallux hammertoe to prevent future ulceration. Discussed trying tenotomy today for right third digit. Procedure below.    Procedure: Excisional Debridement of Wound Rationale: Removal of non-viable soft tissue from the wound to promote healing.  Anesthesia: none Pre-Debridement Wound Measurements: Overlying callus  Post-Debridement Wound Measurements: 0.2 cm x 0.4 cm x 0.2 cm  Type of Debridement: Sharp Excisional Tissue Removed: Non-viable soft tissue Depth of Debridement: subcutaneous tissue. Technique: Sharp excisional debridement to bleeding, viable wound base.  Dressing: Dry, sterile, compression dressing. Disposition: Patient tolerated procedure well. Patient to return in 2 week for follow-up.   Procedure Percutaneous Flexor Tenotomy ([id number]): Procedure: right third digit flexor tenotomy  Surgeon: Louann Sjogren, DPM Pre-op Dx: right third  digit hammertoe with digit ulceration Post-op: Same Place of Surgery: Office surgical suite  Indications for surgery: Flexible hammertoe deformity.  Findings: Digit was able to be straightened after tenotomy preformed.   Today, the risks, benefits, and complications of this procedure were explained, and written consent was obtained. The patient was then placed in  a semi-reclined position, an the foot was prepped and draped in the usual aseptic manner. Attention was directed to the offending toe. Local anesthetic of 3 ml of 1% lidocaine plain was administered. An 18 gauge needed was used in the plantar aspect of the digit and tenotomy was preformed. Straightening of the toe was noted. Hemostasis was obtained by use of compression. Area was copiously irrigated. Digit was splinted with  betadine gauze and dry dressing.   Written and oral post-op instructions were given to the patient for the following home care:  1)Remove dressing tomorrow  2) Do not soak the area  3) Cover toe with band-aid if needed 4)Notify office if toe fails to improve or if there is an increased in redness or swelling.     No follow-ups on file.       No follow-ups on file.   Louann Sjogren, DPM

## 2023-08-30 ENCOUNTER — Ambulatory Visit (INDEPENDENT_AMBULATORY_CARE_PROVIDER_SITE_OTHER): Payer: Medicare Other | Admitting: Podiatry

## 2023-08-30 ENCOUNTER — Encounter: Payer: Self-pay | Admitting: Podiatry

## 2023-08-30 DIAGNOSIS — M2041 Other hammer toe(s) (acquired), right foot: Secondary | ICD-10-CM

## 2023-08-30 DIAGNOSIS — M2042 Other hammer toe(s) (acquired), left foot: Secondary | ICD-10-CM

## 2023-08-30 DIAGNOSIS — L97522 Non-pressure chronic ulcer of other part of left foot with fat layer exposed: Secondary | ICD-10-CM

## 2023-08-30 DIAGNOSIS — G629 Polyneuropathy, unspecified: Secondary | ICD-10-CM | POA: Diagnosis not present

## 2023-08-30 NOTE — Progress Notes (Signed)
  Subjective:  Patient ID: Elizabeth Wang, female    DOB: 05-24-1946,   MRN: [id number]  Chief Complaint  Patient presents with   Wound Check    Pt presents for wound check pt stated she is doing well.    77 y.o. female presents for follow-up of left hallux wound.  Relates doing well and relates she would like to recommit to wearing the shoe and see if the toe will heel. Relates the third toe doing ok but still getting callus build up.  Denies any other pedal complaints. Denies n/v/f/c.   Past Medical History:  Diagnosis Date   Acute pulmonary embolism (HCC) 06/2013   Arthritis    Asthma    years ago    Clotting disorder (HCC)    DVT/PE   Diverticulosis    minor    Dyslipidemia    Esophageal reflux    Hypertension    Hypothyroidism    Numbness    TOES   Obesity    OSA (obstructive sleep apnea)    severe OSA with an AHI of 51/hr and nocturnal hypoxemia with O2 sats at low as 69%.   Paroxysmal atrial fibrillation (HCC)    1 isolated episode in the setting of acute stress with no further episodes.    Pseudogout     Objective:  Physical Exam: Vascular: DP/PT pulses 2/4 bilateral. CFT <3 seconds. Normal hair growth on digits. No edema.  Skin. No lacerations or abrasions bilateral feet. Right third digit nail healing well lateral border. Left hallux ulcer opened measures 0.2 cm x 0.4 cm x 0.2 cm   No erythema edema or purulence noted.  Musculoskeletal: MMT 5/5 bilateral lower extremities in DF, PF, Inversion and Eversion. Deceased ROM in DF of ankle joint. Hallux malleus noted.  Neurological: Sensation intact to light touch.   Assessment:   No diagnosis found.                  Plan:  Patient was evaluated and treated and all questions answered. Ulcer left hallux wound limited to breakdown of skin  -Ulcer debrided procedure below.   -Dressed with offloading pads.  -Off-loading with aperture pads.Discussed wearing surgical shoe. She is willing to commit to two  weeks of wearing the shoe.  -Discussed avoiding any pedicures and staying off of this foot as much as possible.  -Discussed glucose control and proper protein-rich diet.  -Discussed if any worsening redness, pain, fever or chills to call or may need to report to the emergency room. Patient expressed understanding.  -Discussed possible surgery to straighten hallux hammertoe to prevent future ulceration. -Tenotomy site well healed    Procedure: Excisional Debridement of Wound Rationale: Removal of non-viable soft tissue from the wound to promote healing.  Anesthesia: none Pre-Debridement Wound Measurements: Overlying callus  Post-Debridement Wound Measurements: 0.2 cm x 0.4 cm x 0.2 cm  Type of Debridement: Sharp Excisional Tissue Removed: Non-viable soft tissue Depth of Debridement: subcutaneous tissue. Technique: Sharp excisional debridement to bleeding, viable wound base.  Dressing: Dry, sterile, compression dressing. Disposition: Patient tolerated procedure well. Patient to return in 2 week for follow-up.     No follow-ups on file.       No follow-ups on file.   Louann Sjogren, DPM

## 2023-09-07 ENCOUNTER — Other Ambulatory Visit (HOSPITAL_BASED_OUTPATIENT_CLINIC_OR_DEPARTMENT_OTHER): Payer: Self-pay

## 2023-09-12 ENCOUNTER — Encounter: Payer: Self-pay | Admitting: Cardiovascular Disease

## 2023-09-12 NOTE — Progress Notes (Unsigned)
  Cardiology Office Note:  .   Date:  09/14/2023  ID:  Elizabeth Wang, DOB Apr 24, 1946, MRN [id number] PCP: Elizabeth Polka., Elizabeth Wang  [facility] Providers Cardiologist:  Tobias Alexander, Elizabeth Wang Electrophysiologist:  Lanier Prude, Elizabeth Wang  Sleep Medicine:  Armanda Magic, Elizabeth Wang {    History of Present Illness: .    Nov. 19, 2024   Elizabeth Wang is a 77 y.o. female pt of Dr. Shari Prows. I am meeting her for the first time today   Hx of PAF , HTN, hx of pulmonary embolus   Is asymptomatic with her atrial fib  Works out several days a week Walks in the water at [employer]  Its difficult for her to walk - has an altered gait after having pseudogout   No Cp or dyspnea  No difficulty taking stairs       ROS:    Studies Reviewed: [name]       EKG Interpretation Date/Time:  Tuesday September 14 2023 09:43:09 EST Ventricular Rate:  80 PR Interval:    QRS Duration:  106 QT Interval:  376 QTC Calculation: 433 R Axis:   -6  Text Interpretation: Atrial fibrillation Cannot rule out Anterior infarct , age undetermined When compared with ECG of 27-Jun-2020 10:26, Atrial fibrillation has replaced Sinus rhythm Confirmed by Kristeen Miss ([id number]) on 09/14/2023 9:50:23 AM   Risk Assessment/Calculations:    CHA2DS2-VASc Score = 5   his indicates a 7.2% annual risk of stroke. The patient's score is based upon: CHF History: 0 HTN History: 1 Diabetes History: 0 Stroke History: 0 Vascular Disease History: 1 Age Score: 2 Gender Score: 1            Physical Exam:   VS:  BP 118/64   Pulse 80   Ht 5' 3.5" (1.613 m)   Wt 217 lb 12.8 oz (98.8 kg)   LMP  (LMP Unknown)   SpO2 98%   BMI 37.98 kg/m    Wt Readings from Last 3 Encounters:  09/14/23 217 lb 12.8 oz (98.8 kg)  03/08/23 226 lb 3.2 oz (102.6 kg)  09/02/22 230 lb 12.8 oz (104.7 kg)    GEN: Well nourished, well developed in no acute distress NECK: No JVD; No carotid bruits CARDIAC: irreg. Irreg. no murmurs, rubs, gallops RESPIRATORY:   Clear to auscultation without rales, wheezing or rhonchi  ABDOMEN: Soft, non-tender, non-distended EXTREMITIES:  trace - 1+ edema in both ankles,  L > R ,    ASSESSMENT AND PLAN: .      Atrial fib- has persistent atrial fib .  Is asymptomic .  Is on xarelto   2.  Hx of pulmonary embolus - cont xarelto   3.  HTN : BP is well controlled.  Cont current meds.        Dispo: 1 year   Signed, Kristeen Miss, Elizabeth Wang

## 2023-09-13 ENCOUNTER — Ambulatory Visit (INDEPENDENT_AMBULATORY_CARE_PROVIDER_SITE_OTHER): Payer: Medicare Other | Admitting: Podiatry

## 2023-09-13 ENCOUNTER — Encounter: Payer: Self-pay | Admitting: Podiatry

## 2023-09-13 DIAGNOSIS — G629 Polyneuropathy, unspecified: Secondary | ICD-10-CM | POA: Diagnosis not present

## 2023-09-13 DIAGNOSIS — L97522 Non-pressure chronic ulcer of other part of left foot with fat layer exposed: Secondary | ICD-10-CM

## 2023-09-13 NOTE — Progress Notes (Signed)
Subjective:  Patient ID: Elizabeth Wang, female    DOB: 1946/08/19,   MRN: [id number]  No chief complaint on file.   77 y.o. female presents for follow-up of left hallux wound.  Relates doing well  seems improved but would also like ot consider tenotomy on the great toe now since third is doing well.  Relates the third toe doing ok but still getting callus build up.  Denies any other pedal complaints. Denies n/v/f/c.   Past Medical History:  Diagnosis Date   Acute pulmonary embolism (HCC) 06/2013   Arthritis    Asthma    years ago    Clotting disorder (HCC)    DVT/PE   Diverticulosis    minor    Dyslipidemia    Esophageal reflux    Hypertension    Hypothyroidism    Numbness    TOES   Obesity    OSA (obstructive sleep apnea)    severe OSA with an AHI of 51/hr and nocturnal hypoxemia with O2 sats at low as 69%.   Paroxysmal atrial fibrillation (HCC)    1 isolated episode in the setting of acute stress with no further episodes.    Pseudogout     Objective:  Physical Exam: Vascular: DP/PT pulses 2/4 bilateral. CFT <3 seconds. Normal hair growth on digits. No edema.  Skin. No lacerations or abrasions bilateral feet. Right third digit nail healing well lateral border. Left hallux ulcer opened measures 0.2 cm x 0.4 cm x 0.2 cm   No erythema edema or purulence noted.  Musculoskeletal: MMT 5/5 bilateral lower extremities in DF, PF, Inversion and Eversion. Deceased ROM in DF of ankle joint. Hallux malleus noted.  Neurological: Sensation intact to light touch.   Assessment:   1. Ulcer of great toe, left, with fat layer exposed (HCC)   2. Neuropathy                     Plan:  Patient was evaluated and treated and all questions answered. Ulcer left hallux wound limited to breakdown of skin  -Ulcer debrided procedure below.   -Dressed with offloading pads.  -Off-loading with aperture pads. -Discussed avoiding any pedicures and staying off of this foot as much as  possible.  -Discussed glucose control and proper protein-rich diet.  -Discussed if any worsening redness, pain, fever or chills to call or may need to report to the emergency room. Patient expressed understanding.  -Discussed tentomy procedure on the great toe to aid more with healing. Procedure below.    Procedure: Excisional Debridement of Wound Rationale: Removal of non-viable soft tissue from the wound to promote healing.  Anesthesia: none Pre-Debridement Wound Measurements: Overlying callus  Post-Debridement Wound Measurements: 0.2 cm x 0.2 cm x 0.2 cm  Type of Debridement: Sharp Excisional Tissue Removed: Non-viable soft tissue Depth of Debridement: subcutaneous tissue. Technique: Sharp excisional debridement to bleeding, viable wound base.  Dressing: Dry, sterile, compression dressing. Disposition: Patient tolerated procedure well. Patient to return in 2 week for follow-up.   Procedure: Flexor Tenotomy Indication for Procedure: toe with semi-reducible hammertoe with distal tip ulceration. Flexor tenotomy indicated to alleviate contracture, reduce pressure, and enhance healing of the ulceration. Location: left, hallux Anesthesia: Lidocaine 1% plain; 3mL digital block Instrumentation: 18 gauge needle  Technique: The toe was anesthetized as above and prepped in the usual fashion. The toe was exanquinated and a tourniquet was secured at the base of the toe. A 18 gauge needle was then used to make a transverse  incision over the plantar aspect of the distal interphalangeal joint. The flexor tendon was incised with noted release of the hammertoe deformity. The incision was then irrigated and dressed with sterile dressing. Patient tolerated the procedure well. Dressing: Dry, sterile, compression dressing. Disposition: Patient tolerated procedure well. Patient to return in 1 week for follow-up.   No follow-ups on file.       No follow-ups on file.   Louann Sjogren, DPM

## 2023-09-14 ENCOUNTER — Encounter: Payer: Self-pay | Admitting: Cardiovascular Disease

## 2023-09-14 ENCOUNTER — Other Ambulatory Visit: Payer: Self-pay

## 2023-09-14 ENCOUNTER — Ambulatory Visit: Payer: Medicare Other | Attending: Cardiology | Admitting: Cardiovascular Disease

## 2023-09-14 ENCOUNTER — Ambulatory Visit: Payer: Medicare Other | Admitting: Cardiology

## 2023-09-14 VITALS — BP 118/64 | HR 80 | Ht 63.5 in | Wt 217.8 lb

## 2023-09-14 DIAGNOSIS — I2699 Other pulmonary embolism without acute cor pulmonale: Secondary | ICD-10-CM | POA: Insufficient documentation

## 2023-09-14 DIAGNOSIS — Z09 Encounter for follow-up examination after completed treatment for conditions other than malignant neoplasm: Secondary | ICD-10-CM | POA: Insufficient documentation

## 2023-09-14 DIAGNOSIS — I10 Essential (primary) hypertension: Secondary | ICD-10-CM | POA: Insufficient documentation

## 2023-09-14 DIAGNOSIS — E782 Mixed hyperlipidemia: Secondary | ICD-10-CM

## 2023-09-14 MED ORDER — ROSUVASTATIN CALCIUM 10 MG PO TABS: 10.0000 mg | ORAL_TABLET | Freq: Every day | ORAL | 3 refills | Status: DC

## 2023-09-14 NOTE — Patient Instructions (Signed)
 Follow-Up: At [facility], you and your health needs are our priority.  As part of our continuing mission to provide you with exceptional heart care, we have created designated Provider Care Teams.  These Care Teams include your primary Cardiologist (physician) and Advanced Practice Providers (APPs -  Physician Assistants and Nurse Practitioners) who all work together to provide you with the care you need, when you need it.  Your next appointment:   1 year(s)  Provider:   Kristeen Miss, MD

## 2023-09-16 DIAGNOSIS — L821 Other seborrheic keratosis: Secondary | ICD-10-CM | POA: Diagnosis not present

## 2023-09-16 DIAGNOSIS — D224 Melanocytic nevi of scalp and neck: Secondary | ICD-10-CM | POA: Diagnosis not present

## 2023-09-16 DIAGNOSIS — D225 Melanocytic nevi of trunk: Secondary | ICD-10-CM | POA: Diagnosis not present

## 2023-09-16 DIAGNOSIS — D2239 Melanocytic nevi of other parts of face: Secondary | ICD-10-CM | POA: Diagnosis not present

## 2023-09-16 DIAGNOSIS — D1801 Hemangioma of skin and subcutaneous tissue: Secondary | ICD-10-CM | POA: Diagnosis not present

## 2023-09-16 DIAGNOSIS — L814 Other melanin hyperpigmentation: Secondary | ICD-10-CM | POA: Diagnosis not present

## 2023-09-27 ENCOUNTER — Ambulatory Visit (INDEPENDENT_AMBULATORY_CARE_PROVIDER_SITE_OTHER): Payer: Medicare Other | Admitting: Podiatry

## 2023-09-27 ENCOUNTER — Encounter: Payer: Self-pay | Admitting: Podiatry

## 2023-09-27 DIAGNOSIS — M2041 Other hammer toe(s) (acquired), right foot: Secondary | ICD-10-CM

## 2023-09-27 DIAGNOSIS — G629 Polyneuropathy, unspecified: Secondary | ICD-10-CM

## 2023-09-27 DIAGNOSIS — M2042 Other hammer toe(s) (acquired), left foot: Secondary | ICD-10-CM | POA: Diagnosis not present

## 2023-09-27 DIAGNOSIS — L97522 Non-pressure chronic ulcer of other part of left foot with fat layer exposed: Secondary | ICD-10-CM

## 2023-09-27 NOTE — Progress Notes (Signed)
Subjective:  Patient ID: Elizabeth Wang, female    DOB: [birth year],   MRN: [id number]  No chief complaint on file.   77 y.o. female presents for follow-up of left hallux wound.  Relates doing well  seens to be much improved after tentomy. Relates the second toe is now bending more and wondering about second toe tentomy.   Denies any other pedal complaints. Denies n/v/f/c.   Past Medical History:  Diagnosis Date   Acute pulmonary embolism (HCC) 06/2013   Arthritis    Asthma    years ago    Clotting disorder (HCC)    DVT/PE   Diverticulosis    minor    Dyslipidemia    Esophageal reflux    Hypertension    Hypothyroidism    Numbness    TOES   Obesity    OSA (obstructive sleep apnea)    severe OSA with an AHI of 51/hr and nocturnal hypoxemia with O2 sats at low as 69%.   Paroxysmal atrial fibrillation (HCC)    1 isolated episode in the setting of acute stress with no further episodes.    Pseudogout     Objective:  Physical Exam: Vascular: DP/PT pulses 2/4 bilateral. CFT <3 seconds. Normal hair growth on digits. No edema.  Skin. No lacerations or abrasions bilateral feet. Right third digit nail healing well lateral border. Left hallux ulcer opened measures 0.2 cm x 0.1 cm x 0.2 cm   No erythema edema or purulence noted.  Musculoskeletal: MMT 5/5 bilateral lower extremities in DF, PF, Inversion and Eversion. Deceased ROM in DF of ankle joint. Hallux malleus noted. Hammered semi rigid second left digit.  Neurological: Sensation intact to light touch.   Assessment:   1. Ulcer of great toe, left, with fat layer exposed (HCC)   2. Neuropathy   3. Hammertoe, bilateral                      Plan:  Patient was evaluated and treated and all questions answered. Ulcer left hallux wound with fat layer exposed.  -Ulcer debrided procedure below.   -Dressed with offloading pads.  -Off-loading with aperture pads. -Discussed avoiding any pedicures and staying off of this  foot as much as possible.  -Discussed glucose control and proper protein-rich diet.  -Discussed if any worsening redness, pain, fever or chills to call or may need to report to the emergency room. Patient expressed understanding.  -Discussed tentomy procedure on the second toe due to hammering of the digit. . Procedure below.    Procedure: Excisional Debridement of Wound Rationale: Removal of non-viable soft tissue from the wound to promote healing.  Anesthesia: none Pre-Debridement Wound Measurements: Overlying callus  Post-Debridement Wound Measurements: 0.2 cm x 0.1 cm x 0.2 cm  Type of Debridement: Sharp Excisional Tissue Removed: Non-viable soft tissue Depth of Debridement: subcutaneous tissue. Technique: Sharp excisional debridement to bleeding, viable wound base.  Dressing: Dry, sterile, compression dressing. Disposition: Patient tolerated procedure well. Patient to return in 2 week for follow-up.  Procedure: Flexor Tenotomy Indication for Procedure: toe with semi-reducible hammertoe with distal tip ulceration. Flexor tenotomy indicated to alleviate contracture, reduce pressure, and enhance healing of the ulceration. Location: left second digit  Anesthesia: Lidocaine 2% plain; 3mL digital block Instrumentation: 18 gauge needle  Technique: The toe was anesthetized as above and prepped in the usual fashion. The toe was exanquinated and a tourniquet was secured at the base of the toe. A 18 gauge needle was then used  to make a transverse incision over the plantar aspect of the distal interphalangeal joint. The flexor tendon was incised with noted release of the hammertoe deformity. The incision was then irrigated and dressed with sterile dressing. Patient tolerated the procedure well. Dressing: Dry, sterile, compression dressing. Disposition: Patient tolerated procedure well. Patient to return in 1 week for follow-up.      Return in about 2 weeks (around 10/11/2023) for wound check,  tenotomy .       Return in about 2 weeks (around 10/11/2023) for wound check, tenotomy .   Louann Sjogren, DPM

## 2023-09-30 DIAGNOSIS — J329 Chronic sinusitis, unspecified: Secondary | ICD-10-CM | POA: Diagnosis not present

## 2023-09-30 DIAGNOSIS — H6993 Unspecified Eustachian tube disorder, bilateral: Secondary | ICD-10-CM | POA: Diagnosis not present

## 2023-10-11 ENCOUNTER — Ambulatory Visit (INDEPENDENT_AMBULATORY_CARE_PROVIDER_SITE_OTHER): Payer: Medicare Other | Admitting: Podiatry

## 2023-10-11 ENCOUNTER — Encounter: Payer: Self-pay | Admitting: Podiatry

## 2023-10-11 DIAGNOSIS — G629 Polyneuropathy, unspecified: Secondary | ICD-10-CM

## 2023-10-11 DIAGNOSIS — L97529 Non-pressure chronic ulcer of other part of left foot with unspecified severity: Secondary | ICD-10-CM | POA: Diagnosis not present

## 2023-10-11 DIAGNOSIS — L97501 Non-pressure chronic ulcer of other part of unspecified foot limited to breakdown of skin: Secondary | ICD-10-CM

## 2023-10-11 DIAGNOSIS — M2042 Other hammer toe(s) (acquired), left foot: Secondary | ICD-10-CM

## 2023-10-11 DIAGNOSIS — M2041 Other hammer toe(s) (acquired), right foot: Secondary | ICD-10-CM

## 2023-10-11 NOTE — Progress Notes (Signed)
Subjective:  Patient ID: Elizabeth Wang, female    DOB: Mar 04, 1946,   MRN: [id number]  Chief Complaint  Patient presents with   Foot Ulcer    Pt presents for follow-up of left hallux wound states she is doing better.    77 y.o. female presents for follow-up of left hallux wound and left second toe tenotomy.   Relates doing well  seens to be much improved after tenotomy. Has been dressing as instructed. Relates now her third toe on the left has been aching.  Denies any other pedal complaints. Denies n/v/f/c.   Past Medical History:  Diagnosis Date   Acute pulmonary embolism (HCC) 06/2013   Arthritis    Asthma    years ago    Clotting disorder (HCC)    DVT/PE   Diverticulosis    minor    Dyslipidemia    Esophageal reflux    Hypertension    Hypothyroidism    Numbness    TOES   Obesity    OSA (obstructive sleep apnea)    severe OSA with an AHI of 51/hr and nocturnal hypoxemia with O2 sats at low as 69%.   Paroxysmal atrial fibrillation (HCC)    1 isolated episode in the setting of acute stress with no further episodes.    Pseudogout     Objective:  Physical Exam: Vascular: DP/PT pulses 2/4 bilateral. CFT <3 seconds. Normal hair growth on digits. No edema.  Skin. No lacerations or abrasions bilateral feet. Right third digit nail healing well lateral border. Left hallux ulcer opened measures 0.1 cm x 0.1 cm x 0.1 cm   No erythema edema or purulence noted.  Musculoskeletal: MMT 5/5 bilateral lower extremities in DF, PF, Inversion and Eversion. Deceased ROM in DF of ankle joint. Hallux malleus noted. Left second digit in more rectus position.  Neurological: Sensation intact to light touch.   Assessment:   1. Chronic ulcer of great toe, limited to breakdown of skin (HCC)   2. Neuropathy   3. Hammertoe, bilateral                       Plan:  Patient was evaluated and treated and all questions answered. Ulcer left hallux wound limited to breakdown of  skin -Ulcer debrided procedure below.   -Dressed with offloading pads.  -Off-loading with aperture pads. -Discussed avoiding any pedicures and staying off of this foot as much as possible.  -Discussed glucose control and proper protein-rich diet.  -Discussed if any worsening redness, pain, fever or chills to call or may need to report to the emergency room. Patient expressed understanding. .  -Left second digit doing well.  -Left third digit now getting more pressure and elected to undergo tenotomy on the left third digit  Procedure: Excisional Debridement of Wound Rationale: Removal of non-viable soft tissue from the wound to promote healing.  Anesthesia: none Pre-Debridement Wound Measurements: Overlying callus  Post-Debridement Wound Measurements: 0.1 cm x 0.1 cm x 0.1 cm  Type of Debridement: Sharp Excisional Tissue Removed: Non-viable soft tissue Depth of Debridement: subcutaneous tissue. Technique: Sharp excisional debridement to bleeding, viable wound base.  Dressing: Dry, sterile, compression dressing. Disposition: Patient tolerated procedure well. Patient to return in 2 week for follow-up.   Procedure: Flexor Tenotomy Indication for Procedure: toe with semi-reducible hammertoe with distal tip ulceration. Flexor tenotomy indicated to alleviate contracture, reduce pressure, and enhance healing of the ulceration. Location: left, 3rd toe Anesthesia: Lidocaine 1% plain; 3mL digital block Instrumentation: 18  gauge needle  Technique: The toe was anesthetized as above and prepped in the usual fashion. The toe was exanquinated and a tourniquet was secured at the base of the toe. A 18 gauge needle was then used to make a transverse incision over the plantar aspect of the distal interphalangeal joint. The flexor tendon was incised with noted release of the hammertoe deformity. The incision was then irrigated and dressed with sterile dressing. Patient tolerated the procedure well. Dressing:  Dry, sterile, compression dressing. Disposition: Patient tolerated procedure well. Patient to return in 2 week for follow-up.     No follow-ups on file.       No follow-ups on file.   Louann Sjogren, DPM

## 2023-10-25 ENCOUNTER — Encounter: Payer: Self-pay | Admitting: Podiatry

## 2023-10-25 ENCOUNTER — Ambulatory Visit (INDEPENDENT_AMBULATORY_CARE_PROVIDER_SITE_OTHER): Payer: Medicare Other | Admitting: Podiatry

## 2023-10-25 DIAGNOSIS — G629 Polyneuropathy, unspecified: Secondary | ICD-10-CM

## 2023-10-25 DIAGNOSIS — M2041 Other hammer toe(s) (acquired), right foot: Secondary | ICD-10-CM | POA: Diagnosis not present

## 2023-10-25 DIAGNOSIS — M2042 Other hammer toe(s) (acquired), left foot: Secondary | ICD-10-CM | POA: Diagnosis not present

## 2023-10-25 DIAGNOSIS — L97501 Non-pressure chronic ulcer of other part of unspecified foot limited to breakdown of skin: Secondary | ICD-10-CM

## 2023-10-25 NOTE — Progress Notes (Signed)
Subjective:  Patient ID: Elizabeth Wang, female    DOB: September 06, 1946,   MRN: [id number]  Chief Complaint  Patient presents with   Wound Check    Pt presents for  a follow up of ulcer great toe on the left states she is doing better.    77 y.o. female presents for follow-up of left hallux wound and left third toe tenotomy.   Relates doing well  seens to be much improved after tenotomy. Has been dressing as instructed.  Denies any other pedal complaints. Denies n/v/f/c.   Past Medical History:  Diagnosis Date   Acute pulmonary embolism (HCC) 06/2013   Arthritis    Asthma    years ago    Clotting disorder (HCC)    DVT/PE   Diverticulosis    minor    Dyslipidemia    Esophageal reflux    Hypertension    Hypothyroidism    Numbness    TOES   Obesity    OSA (obstructive sleep apnea)    severe OSA with an AHI of 51/hr and nocturnal hypoxemia with O2 sats at low as 69%.   Paroxysmal atrial fibrillation (HCC)    1 isolated episode in the setting of acute stress with no further episodes.    Pseudogout     Objective:  Physical Exam: Vascular: DP/PT pulses 2/4 bilateral. CFT <3 seconds. Normal hair growth on digits. No edema.  Skin. No lacerations or abrasions bilateral feet. Right third digit nail healing well lateral border. Left hallux ulcer healed  No erythema edema or purulence noted.  Musculoskeletal: MMT 5/5 bilateral lower extremities in DF, PF, Inversion and Eversion. Deceased ROM in DF of ankle joint. Hallux malleus noted. Left second digit in more rectus position. Left third digit in more rectus position and hammereing of fourth and fifth on left.  Neurological: Sensation intact to light touch.   Assessment:   1. Chronic ulcer of great toe, limited to breakdown of skin (HCC)   2. Neuropathy   3. Hammertoe, bilateral                        Plan:  Patient was evaluated and treated and all questions answered. Ulcer left hallux wound -healed -Debridement of  hyperkeratotic tissue with underlying ulceration healed.  -Dressed with offloading pads.  -Discussed avoiding any pedicures and staying off of this foot as much as possible.  -Discussed glucose control and proper protein-rich diet.  -Discussed if any worsening redness, pain, fever or chills to call or may need to report to the emergency room. Patient expressed understanding. .  -Left  third digit doing well.  -Left fourth and fifth digit contracted and now getting more pressure so decision to continue tenotomies on rest of left foot.       Procedure: Flexor Tenotomy Indication for Procedure: toe with semi-reducible hammertoe with distal tip ulceration. Flexor tenotomy indicated to alleviate contracture, reduce pressure, and enhance healing of the ulceration. Location: left fourth and fifth digit  Anesthesia: Lidocaine 1% plain; 6mL digital block Instrumentation: 18 gauge needle  Technique: The toe was anesthetized as above and prepped in the usual fashion. The toe was exanquinated and a tourniquet was secured at the base of the toe. A 18 gauge needle was then used to make a transverse incision over the plantar aspect of the distal interphalangeal joint. The flexor tendon was incised with noted release of the hammertoe deformity. The incision was then irrigated and dressed with sterile dressing.  Patient tolerated the procedure well. Dressing: Dry, sterile, compression dressing. Disposition: Patient tolerated procedure well. Patient to return in 1 week for follow-up.     Return in about 2 weeks (around 11/08/2023) for post op.       Return in about 2 weeks (around 11/08/2023) for post op.   Louann Sjogren, DPM

## 2023-11-02 ENCOUNTER — Other Ambulatory Visit: Payer: Self-pay | Admitting: *Deleted

## 2023-11-02 DIAGNOSIS — I4819 Other persistent atrial fibrillation: Secondary | ICD-10-CM

## 2023-11-02 MED ORDER — RIVAROXABAN 20 MG PO TABS: 20.0000 mg | ORAL_TABLET | Freq: Every day | ORAL | 1 refills | Status: DC

## 2023-11-02 NOTE — Telephone Encounter (Signed)
 Xarelto  20mg  refill request received. Pt is 78 years old, weight-98.8kg, Crea-1.13 on 07/07/23 via [facility], last seen by Dr. Alveta on 09/14/23, Diagnosis-PE & PAF, CrCl-65.03 mL/min; Dose is appropriate based on dosing criteria. Will send in refill to requested pharmacy.

## 2023-11-08 ENCOUNTER — Encounter: Payer: Self-pay | Admitting: Podiatry

## 2023-11-08 ENCOUNTER — Ambulatory Visit (INDEPENDENT_AMBULATORY_CARE_PROVIDER_SITE_OTHER): Payer: Medicare Other | Admitting: Podiatry

## 2023-11-08 DIAGNOSIS — L84 Corns and callosities: Secondary | ICD-10-CM | POA: Diagnosis not present

## 2023-11-08 DIAGNOSIS — M2041 Other hammer toe(s) (acquired), right foot: Secondary | ICD-10-CM

## 2023-11-08 DIAGNOSIS — L97501 Non-pressure chronic ulcer of other part of unspecified foot limited to breakdown of skin: Secondary | ICD-10-CM

## 2023-11-08 DIAGNOSIS — M2042 Other hammer toe(s) (acquired), left foot: Secondary | ICD-10-CM

## 2023-11-08 DIAGNOSIS — Z87898 Personal history of other specified conditions: Secondary | ICD-10-CM | POA: Diagnosis not present

## 2023-11-08 NOTE — Progress Notes (Signed)
  Subjective:  Patient ID: Elizabeth Wang, female    DOB: 1946/01/15,   MRN: [id number]  No chief complaint on file.   78 y.o. female presents for follow-up of left hallux wound and left fourth and fith toe tenotomy.   Relates doing well  seens to be much improved after tenotomy. Has not been draining believes still healed.   Denies any other pedal complaints. Denies n/v/f/c.   Past Medical History:  Diagnosis Date   Acute pulmonary embolism (HCC) 06/2013   Arthritis    Asthma    years ago    Clotting disorder (HCC)    DVT/PE   Diverticulosis    minor    Dyslipidemia    Esophageal reflux    Hypertension    Hypothyroidism    Numbness    TOES   Obesity    OSA (obstructive sleep apnea)    severe OSA with an AHI of 51/hr and nocturnal hypoxemia with O2 sats at low as 69%.   Paroxysmal atrial fibrillation (HCC)    1 isolated episode in the setting of acute stress with no further episodes.    Pseudogout     Objective:  Physical Exam: Vascular: DP/PT pulses 2/4 bilateral. CFT <3 seconds. Normal hair growth on digits. No edema.  Skin. No lacerations or abrasions bilateral feet. Right third digit nail healing well lateral border. Left hallux ulcer healed  No erythema edema or purulence noted.  Musculoskeletal: MMT 5/5 bilateral lower extremities in DF, PF, Inversion and Eversion. Deceased ROM in DF of ankle joint. Hallux malleus noted. Left second digit in more rectus position. Left third digit in more rectus position and hammereing of fourth and fifth on left.  Neurological: Sensation intact to light touch.   Assessment:   1. Pre-ulcerative calluses   2. History of ulceration   3. Chronic ulcer of great toe, limited to breakdown of skin (HCC)   4. Hammertoe, bilateral                         Plan:  Patient was evaluated and treated and all questions answered. Ulcer left hallux wound -healed -Debridement of hyperkeratotic tissue with underlying ulceration  healed.  -Dressed with offloading pads.  -Discussed avoiding any pedicures and staying off of this foot as much as possible.  -Discussed glucose control and proper protein-rich diet.  -Discussed if any worsening redness, pain, fever or chills to call or may need to report to the emergency room. Patient expressed understanding. .   Toe was evaluated and appears to be healing well.  Patient to follow-up as needed.      Return in about 3 weeks (around 11/29/2023) for wound check.       Return in about 3 weeks (around 11/29/2023) for wound check.   Asberry Failing, DPM

## 2023-11-16 DIAGNOSIS — H2513 Age-related nuclear cataract, bilateral: Secondary | ICD-10-CM | POA: Diagnosis not present

## 2023-11-23 ENCOUNTER — Ambulatory Visit: Payer: Medicare Other | Admitting: Podiatry

## 2023-11-23 DIAGNOSIS — M48062 Spinal stenosis, lumbar region with neurogenic claudication: Secondary | ICD-10-CM | POA: Diagnosis not present

## 2023-11-23 DIAGNOSIS — M5416 Radiculopathy, lumbar region: Secondary | ICD-10-CM | POA: Diagnosis not present

## 2023-11-30 ENCOUNTER — Encounter: Payer: Self-pay | Admitting: Podiatry

## 2023-11-30 ENCOUNTER — Ambulatory Visit (INDEPENDENT_AMBULATORY_CARE_PROVIDER_SITE_OTHER): Payer: Medicare Other | Admitting: Podiatry

## 2023-11-30 DIAGNOSIS — M2042 Other hammer toe(s) (acquired), left foot: Secondary | ICD-10-CM | POA: Diagnosis not present

## 2023-11-30 DIAGNOSIS — M2041 Other hammer toe(s) (acquired), right foot: Secondary | ICD-10-CM | POA: Diagnosis not present

## 2023-11-30 DIAGNOSIS — Z7901 Long term (current) use of anticoagulants: Secondary | ICD-10-CM

## 2023-11-30 DIAGNOSIS — G629 Polyneuropathy, unspecified: Secondary | ICD-10-CM

## 2023-11-30 DIAGNOSIS — L84 Corns and callosities: Secondary | ICD-10-CM

## 2023-11-30 NOTE — Progress Notes (Signed)
 Subjective:  Patient ID: Elizabeth Wang, female    DOB: 07/29/1946,   MRN: [id number]  No chief complaint on file.   78 y.o. female presents for follow-up of left hallux wound and relates still doing well. She is here to have evaluated and callus trimmed.   She takes xarelto  and is at risk for foot care. Does relate the right third and fourth toes getting irritated and still hammered and wondering if we can do procedure for them.  Denies any other pedal complaints. Denies n/v/f/c.   Past Medical History:  Diagnosis Date   Acute pulmonary embolism (HCC) 06/2013   Arthritis    Asthma    years ago    Clotting disorder (HCC)    DVT/PE   Diverticulosis    minor    Dyslipidemia    Esophageal reflux    Hypertension    Hypothyroidism    Numbness    TOES   Obesity    OSA (obstructive sleep apnea)    severe OSA with an AHI of 51/hr and nocturnal hypoxemia with O2 sats at low as 69%.   Paroxysmal atrial fibrillation (HCC)    1 isolated episode in the setting of acute stress with no further episodes.    Pseudogout     Objective:  Physical Exam: Vascular: DP/PT pulses 2/4 bilateral. CFT <3 seconds. Normal hair growth on digits. No edema.  Skin. No lacerations or abrasions bilateral feet. Right third digit nail healing well lateral border. Left hallux ulcer healed  No erythema edema or purulence noted.  Musculoskeletal: MMT 5/5 bilateral lower extremities in DF, PF, Inversion and Eversion. Deceased ROM in DF of ankle joint. Hallux malleus noted. Right second and fourth digit hammered remaining digits in more rectus position.  Neurological: Sensation intact to light touch.   Assessment:   1. Hammertoe, bilateral   2. Pre-ulcerative calluses   3. Neuropathy   4. Chronic anticoagulation                         Plan:  Patient was evaluated and treated and all questions answered. Ulcer left hallux wound -healed -No debridement necessary.   -Dressed with offloading  pads.  -Patient opted for right second and fourth digit tenotomies today. Procedure below.  -Discussed avoiding any pedicures and staying off of this foot as much as possible.  -Discussed glucose control and proper protein-rich diet.  -Discussed if any worsening redness, pain, fever or chills to call or may need to report to the emergency room. Patient expressed understanding. .    Procedure: Flexor Tenotomy Indication for Procedure: toe with semi-reducible hammertoe with distal tip ulceration. Flexor tenotomy indicated to alleviate contracture, reduce pressure, and enhance healing of the ulceration. Location: right, 2nd toe, 4th toe Anesthesia: Lidocaine  1% plain; 3mL digital block Instrumentation: 18 gauge needle  Technique: The toe was anesthetized as above and prepped in the usual fashion. The toe was exanquinated and a tourniquet was secured at the base of the toe. A 18 gauge needle was then used to make a transverse incision over the plantar aspect of the distal interphalangeal joint. The flexor tendon was incised with noted release of the hammertoe deformity. The incision was then irrigated and dressed with sterile dressing. Patient tolerated the procedure well. Dressing: Dry, sterile, compression dressing. Disposition: Patient tolerated procedure well. Patient to return in 2 week for follow-up.      Return in about 2 weeks (around 12/14/2023) for post op.  Return in about 2 weeks (around 12/14/2023) for post op.   Asberry Failing, DPM

## 2023-12-11 DIAGNOSIS — M545 Low back pain, unspecified: Secondary | ICD-10-CM | POA: Diagnosis not present

## 2023-12-11 DIAGNOSIS — M5416 Radiculopathy, lumbar region: Secondary | ICD-10-CM | POA: Diagnosis not present

## 2023-12-13 ENCOUNTER — Telehealth: Payer: Self-pay | Admitting: Podiatry

## 2023-12-13 ENCOUNTER — Ambulatory Visit (INDEPENDENT_AMBULATORY_CARE_PROVIDER_SITE_OTHER): Payer: Medicare Other | Admitting: Podiatry

## 2023-12-13 DIAGNOSIS — Z91199 Patient's noncompliance with other medical treatment and regimen due to unspecified reason: Secondary | ICD-10-CM

## 2023-12-13 NOTE — Progress Notes (Signed)
 No show

## 2023-12-13 NOTE — Telephone Encounter (Signed)
Pt left message on 2/15 at 934am stating she will not be able to make appt on Monday at 815 due to out of town funeral that she just found out about, she will call to r/s

## 2023-12-20 DIAGNOSIS — M539 Dorsopathy, unspecified: Secondary | ICD-10-CM | POA: Diagnosis not present

## 2023-12-20 DIAGNOSIS — M5416 Radiculopathy, lumbar region: Secondary | ICD-10-CM | POA: Diagnosis not present

## 2023-12-20 DIAGNOSIS — D6869 Other thrombophilia: Secondary | ICD-10-CM | POA: Diagnosis not present

## 2023-12-20 DIAGNOSIS — I4891 Unspecified atrial fibrillation: Secondary | ICD-10-CM | POA: Diagnosis not present

## 2023-12-24 DIAGNOSIS — H25013 Cortical age-related cataract, bilateral: Secondary | ICD-10-CM | POA: Diagnosis not present

## 2023-12-24 DIAGNOSIS — H2512 Age-related nuclear cataract, left eye: Secondary | ICD-10-CM | POA: Diagnosis not present

## 2023-12-24 DIAGNOSIS — H18413 Arcus senilis, bilateral: Secondary | ICD-10-CM | POA: Diagnosis not present

## 2023-12-24 DIAGNOSIS — H2513 Age-related nuclear cataract, bilateral: Secondary | ICD-10-CM | POA: Diagnosis not present

## 2023-12-24 DIAGNOSIS — H25043 Posterior subcapsular polar age-related cataract, bilateral: Secondary | ICD-10-CM | POA: Diagnosis not present

## 2024-01-04 ENCOUNTER — Encounter: Payer: Self-pay | Admitting: Podiatry

## 2024-01-04 ENCOUNTER — Ambulatory Visit (INDEPENDENT_AMBULATORY_CARE_PROVIDER_SITE_OTHER): Admitting: Podiatry

## 2024-01-04 DIAGNOSIS — L84 Corns and callosities: Secondary | ICD-10-CM

## 2024-01-04 DIAGNOSIS — Z7901 Long term (current) use of anticoagulants: Secondary | ICD-10-CM

## 2024-01-04 DIAGNOSIS — G629 Polyneuropathy, unspecified: Secondary | ICD-10-CM

## 2024-01-04 NOTE — Progress Notes (Signed)
  Subjective:  Patient ID: Elizabeth Wang, female    DOB: 1946-05-08,   MRN: [id number]  No chief complaint on file.   78 y.o. female presents for follow-up of left hallux wound and tenotomies of toes.   She takes xarelto and is at risk for foot care.  Denies any other pedal complaints. Denies n/v/f/c.   Past Medical History:  Diagnosis Date   Acute pulmonary embolism (HCC) 06/2013   Arthritis    Asthma    years ago    Clotting disorder (HCC)    DVT/PE   Diverticulosis    minor    Dyslipidemia    Esophageal reflux    Hypertension    Hypothyroidism    Numbness    TOES   Obesity    OSA (obstructive sleep apnea)    severe OSA with an AHI of 51/hr and nocturnal hypoxemia with O2 sats at low as 69%.   Paroxysmal atrial fibrillation (HCC)    1 isolated episode in the setting of acute stress with no further episodes.    Pseudogout     Objective:  Physical Exam: Vascular: DP/PT pulses 2/4 bilateral. CFT <3 seconds. Normal hair growth on digits. No edema.  Skin. No lacerations or abrasions bilateral feet. Right third digit nail healing well lateral border. Left hallux ulcer healed  No erythema edema or purulence noted. Hyeprkeratosis noted to lateral third digit on right. Musculoskeletal: MMT 5/5 bilateral lower extremities in DF, PF, Inversion and Eversion. Deceased ROM in DF of ankle joint. Hallux malleus noted. Right second and fourth digit hammered remaining digits in more rectus position.  Neurological: Sensation intact to light touch.   Assessment:   1. Pre-ulcerative calluses   2. Neuropathy   3. Chronic anticoagulation                          Plan:  Patient was evaluated and treated and all questions answered. Ulcer left hallux wound -healed Second and fourth digits healing well.  -Hyperkeratotic lesion noted to lateral third digit debrided as courtesy.  -No debridement necessary.   -Dressed with offloading pads.  -Discussed glucose control and  proper protein-rich diet.  -Discussed if any worsening redness, pain, fever or chills to call or may need to report to the emergency room. Patient expressed understanding. .    Return in six weeks for recheck.       Return in about 6 weeks (around 02/15/2024) for rfc.       Return in about 6 weeks (around 02/15/2024) for rfc.   Louann Sjogren, DPM

## 2024-01-05 DIAGNOSIS — M1991 Primary osteoarthritis, unspecified site: Secondary | ICD-10-CM | POA: Diagnosis not present

## 2024-01-05 DIAGNOSIS — E669 Obesity, unspecified: Secondary | ICD-10-CM | POA: Diagnosis not present

## 2024-01-05 DIAGNOSIS — Z6838 Body mass index (BMI) 38.0-38.9, adult: Secondary | ICD-10-CM | POA: Diagnosis not present

## 2024-01-05 DIAGNOSIS — M1120 Other chondrocalcinosis, unspecified site: Secondary | ICD-10-CM | POA: Diagnosis not present

## 2024-01-05 DIAGNOSIS — Z79899 Other long term (current) drug therapy: Secondary | ICD-10-CM | POA: Diagnosis not present

## 2024-01-17 ENCOUNTER — Ambulatory Visit: Admitting: Podiatry

## 2024-01-17 DIAGNOSIS — H2512 Age-related nuclear cataract, left eye: Secondary | ICD-10-CM | POA: Diagnosis not present

## 2024-01-18 DIAGNOSIS — M5416 Radiculopathy, lumbar region: Secondary | ICD-10-CM | POA: Diagnosis not present

## 2024-01-18 DIAGNOSIS — H2511 Age-related nuclear cataract, right eye: Secondary | ICD-10-CM | POA: Diagnosis not present

## 2024-01-31 DIAGNOSIS — H2511 Age-related nuclear cataract, right eye: Secondary | ICD-10-CM | POA: Diagnosis not present

## 2024-01-31 DIAGNOSIS — H25011 Cortical age-related cataract, right eye: Secondary | ICD-10-CM | POA: Diagnosis not present

## 2024-01-31 DIAGNOSIS — H25041 Posterior subcapsular polar age-related cataract, right eye: Secondary | ICD-10-CM | POA: Diagnosis not present

## 2024-02-15 ENCOUNTER — Ambulatory Visit (INDEPENDENT_AMBULATORY_CARE_PROVIDER_SITE_OTHER): Admitting: Podiatry

## 2024-02-15 ENCOUNTER — Encounter: Payer: Self-pay | Admitting: Podiatry

## 2024-02-15 DIAGNOSIS — M79674 Pain in right toe(s): Secondary | ICD-10-CM

## 2024-02-15 DIAGNOSIS — M79675 Pain in left toe(s): Secondary | ICD-10-CM | POA: Diagnosis not present

## 2024-02-15 DIAGNOSIS — B351 Tinea unguium: Secondary | ICD-10-CM

## 2024-02-15 DIAGNOSIS — Z7901 Long term (current) use of anticoagulants: Secondary | ICD-10-CM | POA: Diagnosis not present

## 2024-02-15 NOTE — Progress Notes (Signed)
  Subjective:  Patient ID: Elizabeth Wang, female    DOB: October 17, 1946,   MRN: [id number]  No chief complaint on file.   78 y.o. female presents for follow-up of left hallux wound and routine care of the feet.  She takes xarelto  and is at risk for foot care.  Denies any other pedal complaints. Denies n/v/f/c.   Past Medical History:  Diagnosis Date   Acute pulmonary embolism (HCC) 06/2013   Arthritis    Asthma    years ago    Clotting disorder (HCC)    DVT/PE   Diverticulosis    minor    Dyslipidemia    Esophageal reflux    Hypertension    Hypothyroidism    Numbness    TOES   Obesity    OSA (obstructive sleep apnea)    severe OSA with an AHI of 51/hr and nocturnal hypoxemia with O2 sats at low as 69%.   Paroxysmal atrial fibrillation (HCC)    1 isolated episode in the setting of acute stress with no further episodes.    Pseudogout     Objective:  Physical Exam: Vascular: DP/PT pulses 2/4 bilateral. CFT <3 seconds. Normal hair growth on digits. No edema.  Skin. No lacerations or abrasions bilateral feet. Right third digit nail healing well lateral border. Left hallux ulcer healed  No erythema edema or purulence noted. Hyeprkeratosis noted to lateral third digit on right. Musculoskeletal: MMT 5/5 bilateral lower extremities in DF, PF, Inversion and Eversion. Deceased ROM in DF of ankle joint. Hallux malleus noted. Right second and fourth digit hammered remaining digits in more rectus position.  Neurological: Sensation intact to light touch.   Assessment:   1. Pain due to onychomycosis of toenails of both feet   2. Chronic anticoagulation                           Plan:  Patient was evaluated and treated and all questions answered. Ulcer left hallux wound -healed Second and fourth digits healing well.  -Hyperkeratotic lesion noted to lateral third digit debrided as courtesy.   -Mechanically debrided all nails 1-5 bilateral using sterile nail nipper and  filed with dremel without incident  -Answered all patient questions -Patient to return  in 3 months for at risk foot care -Patient advised to call the office if any problems or questions arise in the meantime.         No follow-ups on file.       No follow-ups on file.   Jennefer Moats, DPM

## 2024-03-07 ENCOUNTER — Telehealth: Payer: Self-pay | Admitting: Cardiovascular Disease

## 2024-03-07 NOTE — Telephone Encounter (Signed)
 Spoke with patient and she states she would like to [employer] only

## 2024-03-07 NOTE — Telephone Encounter (Signed)
 Returned a call back to the pt.   Pt states she is currently a Dr. Alroy Aspen pt and is aware he will be retiring at the end of June.  Pt states at last OV with Dr. Alroy Aspen, he advised for her to follow-up with Dr. Theodis Fiscal at our [facility] location in one year, to establish with.  Pt last saw Dr. Alroy Aspen back in Nov 2024 and will be due to see Dr. Theodis Fiscal to establish with in Nov 2025.  Pt is just wanting to make sure she is down to be contacted by our scheduling dept to arrange her one year visit with Dr. Theodis Fiscal, being Dr. Alroy Aspen will be leaving the practice soon and she doesn't want this overlooked.  Informed the pt that she is recalled in the system to follow-up with Dr. Theodis Fiscal at [facility] in Nov 2025, and as soon as that schedule is open, our scheduling dept will reach out to her at that time, to coordinate that appt.  Pt verbalized understanding and agrees with this plan.

## 2024-03-07 NOTE — Telephone Encounter (Signed)
 New Message:     Patient says she would like for Ivy to please give her a call when she has the time. She says it is about changing doctor, once Dr Alroy Aspen leave.

## 2024-03-27 ENCOUNTER — Other Ambulatory Visit: Payer: Self-pay

## 2024-03-27 MED ORDER — DILTIAZEM HCL ER COATED BEADS 360 MG PO CP24: 360.0000 mg | ORAL_CAPSULE | Freq: Every day | ORAL | 1 refills | Status: DC

## 2024-04-04 ENCOUNTER — Other Ambulatory Visit: Payer: Self-pay

## 2024-04-04 DIAGNOSIS — E782 Mixed hyperlipidemia: Secondary | ICD-10-CM

## 2024-04-04 DIAGNOSIS — I480 Paroxysmal atrial fibrillation: Secondary | ICD-10-CM

## 2024-04-04 DIAGNOSIS — I10 Essential (primary) hypertension: Secondary | ICD-10-CM

## 2024-04-04 MED ORDER — HYDROCHLOROTHIAZIDE 25 MG PO TABS: 25.0000 mg | ORAL_TABLET | Freq: Every day | ORAL | 1 refills | Status: DC

## 2024-05-12 ENCOUNTER — Other Ambulatory Visit: Payer: Self-pay | Admitting: Cardiovascular Disease

## 2024-05-12 DIAGNOSIS — I4819 Other persistent atrial fibrillation: Secondary | ICD-10-CM

## 2024-05-12 NOTE — Telephone Encounter (Signed)
 Prescription refill request for Xarelto  received.  Indication:pe Last office visit:11/24 Weight:98.8  kg Age:78 Scr:1.07  3/25 CrCl:67.59  ml/min  Prescription refilled

## 2024-05-16 ENCOUNTER — Encounter: Payer: Self-pay | Admitting: Podiatry

## 2024-05-16 ENCOUNTER — Ambulatory Visit (INDEPENDENT_AMBULATORY_CARE_PROVIDER_SITE_OTHER): Admitting: Podiatry

## 2024-05-16 DIAGNOSIS — Z7901 Long term (current) use of anticoagulants: Secondary | ICD-10-CM | POA: Diagnosis not present

## 2024-05-16 DIAGNOSIS — B351 Tinea unguium: Secondary | ICD-10-CM

## 2024-05-16 DIAGNOSIS — M79674 Pain in right toe(s): Secondary | ICD-10-CM | POA: Diagnosis not present

## 2024-05-16 DIAGNOSIS — M79675 Pain in left toe(s): Secondary | ICD-10-CM

## 2024-05-16 NOTE — Progress Notes (Signed)
  Subjective:  Patient ID: Elizabeth Wang, female    DOB: 1946/07/02,   MRN: [id number]  Chief Complaint  Patient presents with   Callouses    She's going to check these toes and underneath it.  I haven't noticed any signs of distress.    78 y.o. female presents for follow-up of left hallux wound and routine care of the feet.  She takes xarelto  and is at risk for foot care.  Denies any other pedal complaints. Denies n/v/f/c.   Past Medical History:  Diagnosis Date   Acute pulmonary embolism (HCC) 06/2013   Arthritis    Asthma    years ago    Clotting disorder (HCC)    DVT/PE   Diverticulosis    minor    Dyslipidemia    Esophageal reflux    Hypertension    Hypothyroidism    Numbness    TOES   Obesity    OSA (obstructive sleep apnea)    severe OSA with an AHI of 51/hr and nocturnal hypoxemia with O2 sats at low as 69%.   Paroxysmal atrial fibrillation (HCC)    1 isolated episode in the setting of acute stress with no further episodes.    Pseudogout     Objective:  Physical Exam: Vascular: DP/PT pulses 2/4 bilateral. CFT <3 seconds. Normal hair growth on digits. No edema.  Skin. No lacerations or abrasions bilateral feet. Right third digit nail healing well lateral border. Left hallux ulcer healed  No erythema edema or purulence noted. Hyeprkeratosis noted to lateral third digit on right. Musculoskeletal: MMT 5/5 bilateral lower extremities in DF, PF, Inversion and Eversion. Deceased ROM in DF of ankle joint. Hallux malleus noted. Right second and fourth digit hammered remaining digits in more rectus position.  Neurological: Sensation intact to light touch.   Assessment:   1. Pain due to onychomycosis of toenails of both feet   2. Chronic anticoagulation       Plan:  Patient was evaluated and treated and all questions answered. Ulcer left hallux wound -healed Second and fourth digits healing well.  -Hyperkeratotic lesion noted to lateral third digit debrided as  courtesy.   -Mechanically debrided all nails 1-5 bilateral using sterile nail nipper and filed with dremel without incident  -Answered all patient questions -Patient to return  in 3 months for at risk foot care -Patient advised to call the office if any problems or questions arise in the meantime.         No follow-ups on file.       No follow-ups on file.   Asberry Failing, DPM

## 2024-06-10 ENCOUNTER — Encounter: Payer: Self-pay | Admitting: Cardiology

## 2024-06-14 DIAGNOSIS — E039 Hypothyroidism, unspecified: Secondary | ICD-10-CM | POA: Diagnosis not present

## 2024-06-14 DIAGNOSIS — R140 Abdominal distension (gaseous): Secondary | ICD-10-CM | POA: Diagnosis not present

## 2024-06-14 DIAGNOSIS — R5383 Other fatigue: Secondary | ICD-10-CM | POA: Diagnosis not present

## 2024-06-14 DIAGNOSIS — R1013 Epigastric pain: Secondary | ICD-10-CM | POA: Diagnosis not present

## 2024-06-14 DIAGNOSIS — R634 Abnormal weight loss: Secondary | ICD-10-CM | POA: Diagnosis not present

## 2024-06-14 DIAGNOSIS — I10 Essential (primary) hypertension: Secondary | ICD-10-CM | POA: Diagnosis not present

## 2024-06-14 DIAGNOSIS — N390 Urinary tract infection, site not specified: Secondary | ICD-10-CM | POA: Diagnosis not present

## 2024-06-14 DIAGNOSIS — E871 Hypo-osmolality and hyponatremia: Secondary | ICD-10-CM | POA: Diagnosis not present

## 2024-06-15 ENCOUNTER — Other Ambulatory Visit: Payer: Self-pay | Admitting: Internal Medicine

## 2024-06-15 DIAGNOSIS — R1013 Epigastric pain: Secondary | ICD-10-CM

## 2024-06-19 DIAGNOSIS — E039 Hypothyroidism, unspecified: Secondary | ICD-10-CM | POA: Diagnosis not present

## 2024-06-21 ENCOUNTER — Ambulatory Visit
Admission: RE | Admit: 2024-06-21 | Discharge: 2024-06-21 | Disposition: A | Discharge status: Home / Self Care | Source: Ambulatory Visit | Attending: Internal Medicine | Admitting: Internal Medicine

## 2024-06-21 DIAGNOSIS — R1013 Epigastric pain: Secondary | ICD-10-CM

## 2024-06-21 DIAGNOSIS — K5730 Diverticulosis of large intestine without perforation or abscess without bleeding: Secondary | ICD-10-CM | POA: Diagnosis not present

## 2024-06-21 MED ORDER — IOPAMIDOL (ISOVUE-300) INJECTION 61%
100.0000 mL | Freq: Once | INTRAVENOUS | Status: AC | PRN
  Administered 2024-06-21: 100 mL via INTRAVENOUS

## 2024-06-28 DIAGNOSIS — E039 Hypothyroidism, unspecified: Secondary | ICD-10-CM | POA: Diagnosis not present

## 2024-06-28 DIAGNOSIS — I129 Hypertensive chronic kidney disease with stage 1 through stage 4 chronic kidney disease, or unspecified chronic kidney disease: Secondary | ICD-10-CM | POA: Diagnosis not present

## 2024-06-28 DIAGNOSIS — N1831 Chronic kidney disease, stage 3a: Secondary | ICD-10-CM | POA: Diagnosis not present

## 2024-06-28 DIAGNOSIS — J320 Chronic maxillary sinusitis: Secondary | ICD-10-CM | POA: Diagnosis not present

## 2024-06-28 DIAGNOSIS — E785 Hyperlipidemia, unspecified: Secondary | ICD-10-CM | POA: Diagnosis not present

## 2024-06-28 DIAGNOSIS — H6121 Impacted cerumen, right ear: Secondary | ICD-10-CM | POA: Diagnosis not present

## 2024-06-28 DIAGNOSIS — R7301 Impaired fasting glucose: Secondary | ICD-10-CM | POA: Diagnosis not present

## 2024-07-05 DIAGNOSIS — J329 Chronic sinusitis, unspecified: Secondary | ICD-10-CM | POA: Diagnosis not present

## 2024-07-06 DIAGNOSIS — G4733 Obstructive sleep apnea (adult) (pediatric): Secondary | ICD-10-CM | POA: Diagnosis not present

## 2024-07-06 DIAGNOSIS — I480 Paroxysmal atrial fibrillation: Secondary | ICD-10-CM | POA: Diagnosis not present

## 2024-07-06 DIAGNOSIS — E669 Obesity, unspecified: Secondary | ICD-10-CM | POA: Diagnosis not present

## 2024-07-06 DIAGNOSIS — I728 Aneurysm of other specified arteries: Secondary | ICD-10-CM | POA: Diagnosis not present

## 2024-07-06 DIAGNOSIS — E039 Hypothyroidism, unspecified: Secondary | ICD-10-CM | POA: Diagnosis not present

## 2024-07-06 DIAGNOSIS — I872 Venous insufficiency (chronic) (peripheral): Secondary | ICD-10-CM | POA: Diagnosis not present

## 2024-07-06 DIAGNOSIS — R051 Acute cough: Secondary | ICD-10-CM | POA: Diagnosis not present

## 2024-07-06 DIAGNOSIS — R82998 Other abnormal findings in urine: Secondary | ICD-10-CM | POA: Diagnosis not present

## 2024-07-06 DIAGNOSIS — Z1339 Encounter for screening examination for other mental health and behavioral disorders: Secondary | ICD-10-CM | POA: Diagnosis not present

## 2024-07-06 DIAGNOSIS — Z0000 Encounter for general adult medical examination without abnormal findings: Secondary | ICD-10-CM | POA: Diagnosis not present

## 2024-07-06 DIAGNOSIS — N1831 Chronic kidney disease, stage 3a: Secondary | ICD-10-CM | POA: Diagnosis not present

## 2024-07-06 DIAGNOSIS — H53143 Visual discomfort, bilateral: Secondary | ICD-10-CM | POA: Diagnosis not present

## 2024-07-06 DIAGNOSIS — I2510 Atherosclerotic heart disease of native coronary artery without angina pectoris: Secondary | ICD-10-CM | POA: Diagnosis not present

## 2024-07-06 DIAGNOSIS — M1120 Other chondrocalcinosis, unspecified site: Secondary | ICD-10-CM | POA: Diagnosis not present

## 2024-07-06 DIAGNOSIS — H903 Sensorineural hearing loss, bilateral: Secondary | ICD-10-CM | POA: Diagnosis not present

## 2024-07-06 DIAGNOSIS — Z23 Encounter for immunization: Secondary | ICD-10-CM | POA: Diagnosis not present

## 2024-07-06 DIAGNOSIS — Z86711 Personal history of pulmonary embolism: Secondary | ICD-10-CM | POA: Diagnosis not present

## 2024-07-06 DIAGNOSIS — H43391 Other vitreous opacities, right eye: Secondary | ICD-10-CM | POA: Diagnosis not present

## 2024-07-06 DIAGNOSIS — I129 Hypertensive chronic kidney disease with stage 1 through stage 4 chronic kidney disease, or unspecified chronic kidney disease: Secondary | ICD-10-CM | POA: Diagnosis not present

## 2024-07-06 DIAGNOSIS — Z6837 Body mass index (BMI) 37.0-37.9, adult: Secondary | ICD-10-CM | POA: Diagnosis not present

## 2024-07-06 DIAGNOSIS — Z1331 Encounter for screening for depression: Secondary | ICD-10-CM | POA: Diagnosis not present

## 2024-07-06 DIAGNOSIS — Z79899 Other long term (current) drug therapy: Secondary | ICD-10-CM | POA: Diagnosis not present

## 2024-07-06 DIAGNOSIS — M1991 Primary osteoarthritis, unspecified site: Secondary | ICD-10-CM | POA: Diagnosis not present

## 2024-07-06 DIAGNOSIS — D6869 Other thrombophilia: Secondary | ICD-10-CM | POA: Diagnosis not present

## 2024-07-14 DIAGNOSIS — R800 Isolated proteinuria: Secondary | ICD-10-CM | POA: Diagnosis not present

## 2024-07-17 DIAGNOSIS — H34231 Retinal artery branch occlusion, right eye: Secondary | ICD-10-CM | POA: Diagnosis not present

## 2024-07-18 ENCOUNTER — Encounter (HOSPITAL_BASED_OUTPATIENT_CLINIC_OR_DEPARTMENT_OTHER): Payer: Self-pay

## 2024-07-19 ENCOUNTER — Encounter (HOSPITAL_BASED_OUTPATIENT_CLINIC_OR_DEPARTMENT_OTHER): Payer: Self-pay

## 2024-07-19 ENCOUNTER — Ambulatory Visit (HOSPITAL_BASED_OUTPATIENT_CLINIC_OR_DEPARTMENT_OTHER): Admitting: Cardiology

## 2024-07-19 ENCOUNTER — Encounter (HOSPITAL_BASED_OUTPATIENT_CLINIC_OR_DEPARTMENT_OTHER): Payer: Self-pay | Admitting: Cardiology

## 2024-07-19 VITALS — BP 115/80 | HR 73 | Resp 17 | Ht 63.0 in | Wt 215.0 lb

## 2024-07-19 DIAGNOSIS — I10 Essential (primary) hypertension: Secondary | ICD-10-CM | POA: Diagnosis not present

## 2024-07-19 DIAGNOSIS — I4819 Other persistent atrial fibrillation: Secondary | ICD-10-CM | POA: Diagnosis not present

## 2024-07-19 DIAGNOSIS — H34239 Retinal artery branch occlusion, unspecified eye: Secondary | ICD-10-CM

## 2024-07-19 DIAGNOSIS — Z7901 Long term (current) use of anticoagulants: Secondary | ICD-10-CM | POA: Diagnosis not present

## 2024-07-19 DIAGNOSIS — I639 Cerebral infarction, unspecified: Secondary | ICD-10-CM

## 2024-07-19 DIAGNOSIS — E785 Hyperlipidemia, unspecified: Secondary | ICD-10-CM | POA: Diagnosis not present

## 2024-07-19 DIAGNOSIS — H34231 Retinal artery branch occlusion, right eye: Secondary | ICD-10-CM | POA: Diagnosis not present

## 2024-07-19 DIAGNOSIS — H43813 Vitreous degeneration, bilateral: Secondary | ICD-10-CM | POA: Diagnosis not present

## 2024-07-19 DIAGNOSIS — I6381 Other cerebral infarction due to occlusion or stenosis of small artery: Secondary | ICD-10-CM | POA: Diagnosis not present

## 2024-07-19 DIAGNOSIS — H35033 Hypertensive retinopathy, bilateral: Secondary | ICD-10-CM | POA: Diagnosis not present

## 2024-07-19 DIAGNOSIS — D6869 Other thrombophilia: Secondary | ICD-10-CM

## 2024-07-19 MED ORDER — ASPIRIN 81 MG PO TBEC: 81.0000 mg | DELAYED_RELEASE_TABLET | Freq: Every day | ORAL | Status: AC

## 2024-07-19 NOTE — Patient Instructions (Addendum)
 When you next see the Retina Specialist - please have them fax your visit notes to cardiology (Dr. Shelda Bruckner at [phone]   Medication Instructions:  Your physician has recommended you make the following change in your medication:  1.) start aspirin  81 mg - take one tablet daily  *If you need a refill on your cardiac medications before your next appointment, please call your pharmacy* .  Testing/Procedures: ECHO W BUBBLE Your physician has requested that you have an echocardiogram. Echocardiography is a painless test that uses sound waves to create images of your heart. It provides your doctor with information about the size and shape of your heart and how well your heart's chambers and valves are working. This procedure takes approximately one hour. There are no restrictions for this procedure. Please do NOT wear cologne, perfume, aftershave, or lotions (deodorant is allowed). Please arrive 15 minutes prior to your appointment time.  Please note: We ask at that you not bring children with you during ultrasound (echo/ vascular) testing. Due to room size and safety concerns, children are not allowed in the ultrasound rooms during exams. Our front office staff cannot provide observation of children in our lobby area while testing is being conducted. An adult accompanying a patient to their appointment will only be allowed in the ultrasound room at the discretion of the ultrasound technician under special circumstances. We apologize for any inconvenience.  Your physician has requested that you have a carotid duplex. This test is an ultrasound of the carotid arteries in your neck. It looks at blood flow through these arteries that supply the brain with blood. Allow one hour for this exam. There are no restrictions or special instructions.   Follow-Up: Okay to keep already scheduled new patient appointment with Dr. Raford in December, pending test results.

## 2024-07-19 NOTE — Progress Notes (Signed)
 Cardiology Office Note:  .   Date:  07/19/2024  ID:  Elizabeth Wang, DOB 12/12/1945, MRN [id number] PCP: Loreli Elsie JONETTA Mickey., MD  [facility] Providers Cardiologist:  Shelda Bruckner, MD Electrophysiologist:  OLE ONEIDA HOLTS, MD  Sleep Medicine:  Wilbert Bihari, MD {  History of Present Illness: .   Elizabeth Wang is a 78 y.o. female with PMH retinal artery occlusion, hypertension, hyperlipidemia, CAD by CT scan, paroxysmal atrial fibrillation, CKD stage 3a, venous insufficiency, obesity, OSA on bipap. She was previously followed by Dr. Maranda, Dr. Hobart and Dr. Alveta, and she established care with me on 07/19/24.  Referral from 07/17/24 from Dr. Cleotilde reviewed. I do not have a copy of his notes, but referral noted for retinal artery occlusion. Last visit note from 07/06/24 from Dr. Loreli reviewed. Noted history of hypertension, hyperlipidemia, CAD by CT scan, paroxysmal atrial fibrillation, CKD stage 3a, venous insufficiency, obesity, OSA on bipap. Noted history of hyponatremia on hydrochlorothiazide . Labs show Tchol 115, TG 138, HDL 43, LDL 44. ApoB 59 (normal).   She was told she had a retinal stroke by her eye doctor, was told that she needed to find the source of this. I do not have records. Patient reports that initial optometrist did imaging when she had vision changes, did not show anything. Went to repeat exam two weeks later which is when she was told that she had a retinal stroke.  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 Has upcoming appt with Dr. Magda.  Has never missed a dose of Xarelto .   We discussed potential etiologies of CVA (hemorrhagic seems unlikely based on report, but discussed ischemic vs. Embolic). See summary below. She is not on an antiplatelet. Does not have major bleeding issues.   She had acute onset of vision change after her PCP visit on 07/06/24. Felt like a curtain came down over her right side, initially  obscured all of her vision and now has only focal visual field defects.  She doesn't have clear focal neuro symptoms otherwise but notes a history of chronic imbalance/gait instability for which she has been seeing PT.  Since being off the hydrochlorothiazide  her chronic LE edema has been mild and stable  Has chronic pain in the center of her chest, focal, has been going on all of my adult life.  ROS: Denies shortness of breath at rest or with normal exertion. No PND, orthopnea, change in LE edema or unexpected weight gain. No syncope or palpitations. ROS otherwise negative except as noted.   Studies Reviewed: SABRA    EKG:  EKG Interpretation Date/Time:  Wednesday July 19 2024 09:02:56 EDT Ventricular Rate:  73 PR Interval:    QRS Duration:  114 QT Interval:  396 QTC Calculation: 436 R Axis:   -33  Text Interpretation: Atrial fibrillation Left axis deviation Moderate voltage criteria for LVH, may be normal variant Cannot rule out Anterior infarct (cited on or before 14-Sep-2023) Confirmed by Bruckner Shelda [phone]) on 07/19/2024 9:05:58 AM    Physical Exam:   VS:  BP 115/80 (BP Location: Left Arm, Patient Position: Sitting, Cuff Size: Normal)   Pulse 73   Resp 17   Ht 5' 3 (1.6 m)   Wt 215 lb (97.5 kg)   LMP  (LMP Unknown)   SpO2 96%   BMI 38.09 kg/m    Wt Readings from Last 3 Encounters:  07/19/24 215 lb (97.5 kg)  09/14/23 217 lb  12.8 oz (98.8 kg)  03/08/23 226 lb 3.2 oz (102.6 kg)    GEN: Well nourished, well developed in no acute distress HEENT: Normal, moist mucous membranes NECK: No JVD CARDIAC: irregular rhythm, normal S1 and S2, no rubs or gallops. 1/6 systolic murmur. VASCULAR: Radial and DP pulses 2+ bilaterally. No carotid bruits RESPIRATORY:  Clear to auscultation without rales, wheezing or rhonchi  ABDOMEN: Soft, non-tender, non-distended MUSCULOSKELETAL:  Ambulates independently SKIN: Warm and dry, no pitting edema, chronic appearing skin  discoloration NEUROLOGIC:  Alert and oriented x 3. No focal neuro deficits noted. PSYCHIATRIC:  Normal affect    ASSESSMENT AND PLAN: .    Retinal artery occlusion, consistent with CVA -I do not have records from her optometrist -we discussed CVA at length. Does not sound like there is concern for hemorrhagic etiology. Discussed ischemic vs. Embolic, how these are managed differently -she plans to see retinal specialist to get more information -we discussed workup. Will start with carotids and echo with bubble. Based on recommendations from retina specialist, would need to consider further workup (MRI, TEE, referral to neuro) based on this -she has been on xarelto  for a long time, no missed doses ever -last LDL 44. Continue rosuvastatin  10 mg daily -has not been on antiplatelet as she has been on DOAC. We discussed aspirin  vs clopidogrel today, in addition to her xarelto . Will start aspirin  while awaiting further evaluation.  Persistent atrial fibrillation -CHA2DS2/VAS Stroke Risk Points=7  -continue xarelto . Cannot determine if stroke was Xarelto  failure as I do not have etiology of her stroke -rate control with diltiazem   Hypertension -at goal on diltiazem , telmisartan  Hyperlipidemia ASCVD, including stroke equivalent as above and coronary calcification -continue rosuvastatin , due for lipid recheck at follow up if not done prior, LDL goal <70     Total time of encounter: I spent 56 minutes dedicated to the care of this patient on the date of this encounter to include pre-visit review of records, face-to-face time with the patient discussing conditions above, and clinical documentation with the electronic health record. We specifically spent time today discussing retinal artery occlusion, CVA types, how type of CVA affects further management.   Dispo: Plans to see Dr. Raford in December to establish care, per patient preference  Signed, Shelda Bruckner, MD   Shelda Bruckner, MD, PhD, [facility]  [facility]  [place]  [facility] at [facility] at [facility] [address] [place], [place] [address] [phone]

## 2024-07-20 ENCOUNTER — Other Ambulatory Visit (HOSPITAL_BASED_OUTPATIENT_CLINIC_OR_DEPARTMENT_OTHER): Payer: Self-pay

## 2024-07-20 DIAGNOSIS — Z23 Encounter for immunization: Secondary | ICD-10-CM | POA: Diagnosis not present

## 2024-07-20 MED ORDER — COMIRNATY 30 MCG/0.3ML IM SUSY
0.3000 mL | PREFILLED_SYRINGE | Freq: Once | INTRAMUSCULAR | 0 refills | Status: AC
  Filled 2024-07-20: qty 0.3, 1d supply, fill #0

## 2024-07-20 NOTE — Telephone Encounter (Signed)
 Lonni Slain, MD to Me    07/20/24  8:06 AM This patient needs an urgent referral to neurology--I am not the person to be making decisions about management of stroke. Can you call [facility] and please ask for STAT referral? I am happy to discuss with a physician if needed. If they want me to order an MRI before they see here, please ask them exactly what type of study they want me to order. Thank you! _________________________________________________________  Urgent neuro consult placed.  Call placed to [facility].  Spoke w new patient referral team member.  She will plan to reach out to patient today and get her an appointment within a week.  Replied to patient's message giving her this update.

## 2024-07-24 ENCOUNTER — Ambulatory Visit (HOSPITAL_COMMUNITY)
Admission: RE | Admit: 2024-07-24 | Discharge: 2024-07-24 | Disposition: A | Discharge status: Home / Self Care | Source: Ambulatory Visit | Attending: Cardiology | Admitting: Cardiology

## 2024-07-24 DIAGNOSIS — I639 Cerebral infarction, unspecified: Secondary | ICD-10-CM | POA: Insufficient documentation

## 2024-07-24 DIAGNOSIS — H34239 Retinal artery branch occlusion, unspecified eye: Secondary | ICD-10-CM | POA: Insufficient documentation

## 2024-07-24 DIAGNOSIS — H34231 Retinal artery branch occlusion, right eye: Secondary | ICD-10-CM | POA: Diagnosis not present

## 2024-07-26 DIAGNOSIS — Z1231 Encounter for screening mammogram for malignant neoplasm of breast: Secondary | ICD-10-CM | POA: Diagnosis not present

## 2024-07-27 ENCOUNTER — Encounter: Payer: Self-pay | Admitting: Neurology

## 2024-07-27 ENCOUNTER — Ambulatory Visit (INDEPENDENT_AMBULATORY_CARE_PROVIDER_SITE_OTHER): Admitting: Neurology

## 2024-07-27 VITALS — BP 130/68 | HR 88 | Ht 63.0 in | Wt 213.0 lb

## 2024-07-27 DIAGNOSIS — I480 Paroxysmal atrial fibrillation: Secondary | ICD-10-CM | POA: Diagnosis not present

## 2024-07-27 DIAGNOSIS — H34231 Retinal artery branch occlusion, right eye: Secondary | ICD-10-CM

## 2024-07-27 NOTE — Patient Instructions (Signed)
 Continue current medications including  Aspirin , Xarelto , and Crestor   MRI Brain without contrast CT angiogram head and neck Follow-up with echocardiogram as scheduled I will contact you to go over the result Follow-up with your ophthalmologist Return sooner if worse

## 2024-07-27 NOTE — Progress Notes (Signed)
 [facility]  PATIENT: Elizabeth Wang DOB: 10-27-1945  REQUESTING CLINICIAN: Raford Riggs, MD HISTORY FROM: Patient  REASON FOR VISIT: Branch Retina Artery Occlusion    HISTORICAL  CHIEF COMPLAINT:  Chief Complaint  Patient presents with   New Patient (Initial Visit)    Rm13, alone  internal referral for CVA/branch retinal artery occlusion:    HISTORY OF PRESENT ILLNESS:  Discussed the use of AI scribe software for clinical note transcription with the patient, who gave verbal consent to proceed.  Elizabeth Wang is a 78 year old female with atrial fibrillation, history of PE, hypertension, Hyperlipidemia, Hypothyroidism, CKD who presents with vision changes in the right eye. She was referred by her cardiologist for evaluation of a stroke in the right retina.  On September 11th, she experienced a sudden onset of vision changes described as a 'curtain' coming down over her right eye. Initially thought to be a detached retina by her optometrist, it was later diagnosed as a stroke in the right retina by a retina specialist on September 24th. The vision change initially appeared as a solid curtain and later evolved into a cloud-like obstruction in her field of vision. She can see through the edges but not through the central area of the obstruction. Despite this, she is able to perform daily activities such as driving, working on the computer, and reading, although the obstruction remains bothersome. No pain is associated with the vision changes.  Her past medical history is significant for atrial fibrillation, for which she has been consistently taking Xarelto  for ten years without missing a dose. She tells me that Xarelto  was initially for PE. She also has a history of cataracts, which were surgically treated, and a past pulmonary embolism for which she is on long-term anticoagulation therapy. She underwent a carotid artery Doppler study. She is currently on Crestor  for  cholesterol management and reports her LDL levels are well-controlled. She was recently recommended to take Aspirin  as part of her stroke prevention regimen. She is pending an echocardiogram.  She has experienced weight loss this year, which she attributes to a decreased appetite following a suspected COVID-19 infection earlier in the summer. No new medications or supplements except for switching between antihistamines like Zyrtec, Allegra, and Xyzal.   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 She remains active, working and traveling, and plans to resume her regular swimming routine.    OTHER MEDICAL CONDITIONS: History of PE, Hypothyroidism, Hyperlipidemia, Hypertension, CKD   REVIEW OF SYSTEMS: Full 14 system review of systems performed and negative with exception of: As noted in the HPI   ALLERGIES: Allergies  Allergen Reactions   Nsaids Other (See Comments)    significantly raises BP   Tolmetin Other (See Comments)    tolmetin   Bee Venom Swelling   Insect Extract     Other Reaction(s): Unknown   Other     Artificial sweeteners cause dizziness and altered mental state   Codeine Nausea Only    HOME MEDICATIONS: Outpatient Medications Prior to Visit  Medication Sig Dispense Refill   acetaminophen  (TYLENOL ) 500 MG tablet Take 1,000 mg by mouth every 8 (eight) hours as needed for moderate pain or headache.     ASPERCREME LIDOCAINE  EX Apply 1 application topically daily as needed (arthritis pain).     aspirin   EC 81 MG tablet Take 1 tablet (81 mg total) by mouth daily. Swallow whole.     COVID-19 mRNA bivalent vaccine, Pfizer, (PFIZER COVID-19 VAC BIVALENT) injection Inject into the muscle. 0.3 mL 0   COVID-19 mRNA bivalent vaccine, Pfizer, (PFIZER COVID-19 VAC BIVALENT) injection Inject into the muscle. 0.3 mL 0    COVID-19 mRNA vaccine 2023-2024 (COMIRNATY ) syringe Inject into the muscle. 0.3 mL 0   COVID-19 mRNA vaccine, Moderna, 100 MCG/0.5ML injection Inject into the muscle. 0.25 mL 0   diltiazem  (CARDIZEM  CD) 360 MG 24 hr capsule Take 1 capsule (360 mg total) by mouth daily. 90 capsule 1   EPINEPHrine 0.3 mg/0.3 mL IJ SOAJ injection Inject 0.3 mg into the muscle as needed for anaphylaxis (Allergic Reaction).      fluticasone (FLONASE) 50 MCG/ACT nasal spray Place 1-2 sprays into both nostrils daily as needed for allergies.      gabapentin (NEURONTIN) 300 MG capsule Take 300 mg by mouth in the morning, at noon, and at bedtime. (Patient taking differently: Take 300 mg by mouth as needed.)     levothyroxine  (SYNTHROID , LEVOTHROID) 125 MCG tablet Take 125 mcg by mouth daily before breakfast. For thyroid  therapy     rosuvastatin  (CRESTOR ) 10 MG tablet Take 1 tablet (10 mg total) by mouth daily. 90 tablet 3   RSV vaccine recomb adjuvanted (AREXVY ) 120 MCG/0.5ML injection Inject into the muscle. 0.5 mL 0   telmisartan (MICARDIS) 80 MG tablet Take 80 mg by mouth daily.     valACYclovir (VALTREX) 1000 MG tablet Take 1,000 mg by mouth daily as needed (outbreaks).      XARELTO  20 MG TABS tablet TAKE ONE TABLET DAILY WITH SUPPER 90 tablet 1   Zoster Vaccine Adjuvanted (SHINGRIX ) injection Inject into the muscle. 0.5 mL 0   Zoster Vaccine Adjuvanted (SHINGRIX ) injection Inject into the muscle. 0.5 mL 0   cetirizine (ZYRTEC) 10 MG tablet Take 10 mg by mouth daily as needed for allergies.  (Patient not taking: Reported on 07/27/2024)     Cholecalciferol (VITAMIN D3) 125 MCG (5000 UT) CAPS Take 1 capsule by mouth daily.     colchicine  0.6 MG tablet Take 0.6 mg by mouth at bedtime. For gout (Patient not taking: Reported on 07/27/2024)     diltiazem  (CARDIZEM ) 30 MG tablet Takje one tablet by mouth every 4hrs as needed for heart rate > 100 (Patient not taking: Reported on 07/27/2024)     Multiple Vitamin (MULTIVITAMIN  ADULT) TABS Take 1 tablet by mouth daily.     ondansetron  (ZOFRAN -ODT) 4 MG disintegrating tablet Take 4 mg by mouth 3 (three) times daily as needed (nausea/vomiting). For N&V (Patient not taking: Reported on 07/27/2024)     No facility-administered medications prior to visit.    PAST MEDICAL HISTORY: Past Medical History:  Diagnosis Date   Acute pulmonary embolism (HCC) 06/2013   Aortic stenosis, mild 08/2022   ECHO 08/2022 showed mild AS. Recommend 2 yr f/u per cardiology. Asymptomatic.   Arthritis    Asthma    years ago    Chronic kidney disease    Clotting disorder    DVT/PE   Diverticulosis    minor    Dyslipidemia    Esophageal reflux    Hyperlipidemia    Hypertension    Hyponatremia    Resolved with discontinuation of HCTZ   Hypothyroidism    Numbness    TOES   Obesity    OSA (obstructive sleep apnea)    severe OSA  with an AHI of 51/hr and nocturnal hypoxemia with O2 sats at low as 69%.   Paroxysmal atrial fibrillation (HCC)    1 isolated episode in the setting of acute stress with no further episodes.    Pseudogout    Secondary hypercoagulable state    Splenic artery aneurysm    Venous insufficiency (chronic) (peripheral)     PAST SURGICAL HISTORY: Past Surgical History:  Procedure Laterality Date   BREAST RECONSTRUCTION     CARDIOVERSION N/A 06/27/2020   Procedure: CARDIOVERSION;  Surgeon: Jeffrie Oneil BROCKS, MD;  Location: [facility];  Service: Cardiovascular;  Laterality: N/A;   CESAREAN SECTION     COLONOSCOPY  2004   repeat in ten years   KNEE ARTHROSCOPY Left    TONSILLECTOMY     TOTAL KNEE ARTHROPLASTY Right 01/01/2014   Procedure: TOTAL RIGHT KNEE ARTHROPLASTY;  Surgeon: Dempsey LULLA Moan, MD;  Location: [facility];  Service: Orthopedics;  Laterality: Right;   TOTAL KNEE ARTHROPLASTY Left 07/23/2014   Procedure: LEFT TOTAL KNEE ARTHROPLASTY;  Surgeon: Dempsey Moan LULLA, MD;  Location: [facility];  Service: Orthopedics;  Laterality: Left;   tummy tuck  1987   WISDOM  TOOTH EXTRACTION      FAMILY HISTORY: Family History  Problem Relation Age of Onset   Heart disease Father    Hypertension Father    Coronary artery disease Father    CVA Mother     SOCIAL HISTORY: Social History   Socioeconomic History   Marital status: Married    Spouse name: Not on file   Number of children: 2   Years of education: Not on file   Highest education level: Not on file  Occupational History   Occupation: Firefighter  Tobacco Use   Smoking status: Former    Current packs/day: 0.00    Average packs/day: 0.3 packs/day for 12.0 years (3.0 ttl pk-yrs)    Types: Cigarettes    Start date: 12/28/1963    Quit date: 12/28/1975    Years since quitting: 48.6   Smokeless tobacco: Never  Vaping Use   Vaping status: Never Used  Substance and Sexual Activity   Alcohol  use: Yes    Alcohol /week: 4.0 standard drinks of alcohol     Types: 4 Glasses of wine per week    Comment: socially   Drug use: No   Sexual activity: Not on file  Other Topics Concern   Not on file  Social History Narrative   Lives in [place] with her husband. Exercises at the gym 4 times a week.   Right handed   Caffeine-none   Social Drivers of Health   Financial Resource Strain: Low Risk  (10/03/2019)   Received from [facility] visits prior to 12/26/2022.   Overall Financial Resource Strain (CARDIA)    Difficulty of Paying Living Expenses: Not hard at all  Food Insecurity: No Food Insecurity (10/03/2019)   Received from [facility] visits prior to 12/26/2022.   Hunger Vital Sign    Within the past 12 months, you worried that your food would run out before you got the money to buy more.: Never true    Within the past 12 months, the food you bought just didn't last and you didn't have money to get more.: Never true  Transportation Needs: No Transportation Needs (10/03/2019)   [facility].    PRAPARE - Administrator, Civil Service (Medical):  No    Lack of Transportation (Non-Medical): No  Physical Activity: Not on file  Stress: Not on file  Social Connections: Not on file  Intimate Partner Violence: Not on file    PHYSICAL EXAM  GENERAL EXAM/CONSTITUTIONAL: Vitals:  Vitals:   07/27/24 0816  BP: 130/68  Pulse: 88  Weight: 213 lb (96.6 kg)  Height: 5' 3 (1.6 m)   Body mass index is 37.73 kg/m. Wt Readings from Last 3 Encounters:  07/27/24 213 lb (96.6 kg)  07/19/24 215 lb (97.5 kg)  09/14/23 217 lb 12.8 oz (98.8 kg)   Patient is in no distress; well developed, nourished and groomed; neck is supple  MUSCULOSKELETAL: Gait, strength, tone, movements noted in Neurologic exam below  NEUROLOGIC: MENTAL STATUS:      No data to display         awake, alert, oriented to person, place and time recent and remote memory intact normal attention and concentration language fluent, comprehension intact, naming intact fund of knowledge appropriate  CRANIAL NERVE:  2nd, 3rd, 4th, 6th - Decrease superior field visual acuity with the right eye otherwise normal visual field test, extraocular muscles intact, no nystagmus 5th - facial sensation symmetric 7th - facial strength symmetric 8th - hearing intact 9th - palate elevates symmetrically, uvula midline 11th - shoulder shrug symmetric 12th - tongue protrusion midline  MOTOR:  normal bulk and tone, full strength in the BUE, BLE  SENSORY:  normal and symmetric to light touch  COORDINATION:  finger-nose-finger, fine finger movements normal  GAIT/STATION:  normal   DIAGNOSTIC DATA (LABS, IMAGING, TESTING) - I reviewed patient records, labs, notes, testing and imaging myself where available.  Lab Results  Component Value Date   WBC 7.2 02/19/2022   HGB 13.1 02/19/2022   HCT 38.6 02/19/2022   MCV 96 02/19/2022   PLT 190 02/19/2022      Component Value Date/Time   NA 137 02/19/2022 1238    NA 136 11/28/2013 1013   K 4.0 02/19/2022 1238   K 3.7 11/28/2013 1013   CL 97 02/19/2022 1238   CO2 21 02/19/2022 1238   CO2 30 (H) 11/28/2013 1013   GLUCOSE 101 (H) 02/19/2022 1238   GLUCOSE 141 (H) 07/25/2014 0402   GLUCOSE 85 11/28/2013 1013   BUN 20 02/19/2022 1238   BUN 21.9 11/28/2013 1013   CREATININE 0.98 02/19/2022 1238   CREATININE 1.0 11/28/2013 1013   CALCIUM  9.4 02/19/2022 1238   CALCIUM  9.7 11/28/2013 1013   PROT 6.8 12/01/2019 1529   PROT 6.6 11/28/2013 1013   ALBUMIN 4.2 12/01/2019 1529   ALBUMIN 3.9 11/28/2013 1013   AST 31 12/01/2019 1529   AST 20 11/28/2013 1013   ALT 35 (H) 12/01/2019 1529   ALT 30 11/28/2013 1013   ALKPHOS 70 12/01/2019 1529   ALKPHOS 51 11/28/2013 1013   BILITOT 0.6 12/01/2019 1529   BILITOT 0.87 11/28/2013 1013   GFRNONAA 44 (L) 06/20/2020 1606   GFRAA 50 (L) 06/20/2020 1606   Lab Results  Component Value Date   CHOL 138 12/01/2019   HDL 46 12/01/2019   LDLCALC 64 12/01/2019   TRIG 168 (H) 12/01/2019   CHOLHDL 3.0 12/01/2019   No results found for: HGBA1C No results found for: VITAMINB12 Lab Results  Component Value Date   TSH 1.150 12/01/2019    ASSESSMENT AND PLAN  78 y.o. year old female with history of atrial fibrillation, hypertension, hyperlipidemia, hypothyroidism, history of PE who is presenting with right eye  visual field deficit diagnosed with Branch Retinal Artery Occlusion. She is currently on Xarelto , Aspirin  and Crestor    Branch retinal artery occlusion, right eye Confirmed branch retinal artery occlusion in the right eye by evaluation of a retina specialist, presenting as a stroke in the retina with painless vision loss. Likely due to embolic events, possibly related to atrial fibrillation or carotid artery disease. Despite anticoagulation with Xarelto , further investigation is needed. She is concerned about potential future strokes and vision impact. - Order MRI of the head - Order CT angiogram of the  head and neck - Review carotid Doppler results - Continue aspirin  and Xarelto   Atrial fibrillation Chronic atrial fibrillation, well-managed with Xarelto  for anticoagulation. A significant risk factor for embolic events, including retinal artery occlusion. She is compliant with her medication regimen. - Continue Xarelto  - Review any potential drug interactions with Xarelto   Hypertension Hypertension managed with medication. Blood pressure control is crucial to prevent further vascular events.  Hyperlipidemia Hyperlipidemia well-controlled with Crestor . LDL levels are optimal, reducing the risk of further vascular events. - Continue Crestor     1. Retinal artery branch occlusion of right eye   2. Paroxysmal atrial fibrillation (HCC)      Patient Instructions  Continue current medications including  Aspirin , Xarelto , and Crestor   MRI Brain without contrast CT angiogram head and neck Follow-up with echocardiogram as scheduled I will contact you to go over the result Follow-up with your ophthalmologist Return sooner if worse   Orders Placed This Encounter  Procedures   MR BRAIN WO CONTRAST   CT ANGIO HEAD NECK W WO CM    No orders of the defined types were placed in this encounter.   Return if symptoms worsen or fail to improve.    Pastor Falling, MD 07/27/2024, 1:14 PM  [facility] [address], [address] [place], [place] [address] [phone]

## 2024-07-31 ENCOUNTER — Encounter: Payer: Self-pay | Admitting: Neurology

## 2024-08-01 ENCOUNTER — Encounter: Payer: Self-pay | Admitting: Vascular Surgery

## 2024-08-01 ENCOUNTER — Ambulatory Visit: Attending: Vascular Surgery | Admitting: Vascular Surgery

## 2024-08-01 VITALS — BP 130/84 | HR 77 | Temp 97.9°F | Ht 63.0 in | Wt 215.7 lb

## 2024-08-01 DIAGNOSIS — I728 Aneurysm of other specified arteries: Secondary | ICD-10-CM | POA: Insufficient documentation

## 2024-08-01 NOTE — Progress Notes (Signed)
 [facility] OF [place]  ASSESSMENT / PLAN: 78 y.o. 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  Patient reassured about this finding and its very low risk for rupture.  Patient encouraged to keep follow-up with neurology team regarding amaurosis fugax.  I reassured her about the carotid duplex result. If CT angiogram of the head and neck is discordant with carotid duplex, please call me for any questions.  Plan repeat CT angiogram of the abdomen pelvis in 1 year to reevaluate splenic artery aneurysm.  CHIEF COMPLAINT: Splenic artery aneurysm identified on CT scan; amaurosis fugax  HISTORY OF PRESENT ILLNESS: Elizabeth Wang is a 79 y.o. female referred to clinic for evaluation of a incidental discovery of splenic artery aneurysm.  This is found during workup for abdominal pain.  This is since resolved.  Patient recently suffered an episode of amaurosis fugax.  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  Carotid duplex was unrevealing with mild plaque.  We reviewed workup of both problems today.  I reassured her about the splenic artery aneurysm.  I reassured her about the carotid duplex findings.  Past Medical History:  Diagnosis Date   Acute pulmonary embolism (HCC) 06/2013   Aortic stenosis, mild 08/2022   ECHO 08/2022 showed mild AS. Recommend 2 yr f/u per cardiology. Asymptomatic.   Arthritis    Asthma    years ago    Chronic kidney disease    Clotting disorder    DVT/PE   Diverticulosis    minor    Dyslipidemia    Esophageal reflux    Hyperlipidemia    Hypertension    Hyponatremia    Resolved with discontinuation of HCTZ   Hypothyroidism    Numbness    TOES   Obesity    OSA (obstructive sleep apnea)    severe OSA with an AHI of 51/hr and nocturnal hypoxemia with O2 sats at low as 69%.   Paroxysmal atrial fibrillation (HCC)    1 isolated episode in the setting of acute stress with no further episodes.    Pseudogout    Secondary  hypercoagulable state    Splenic artery aneurysm    Venous insufficiency (chronic) (peripheral)     Past Surgical History:  Procedure Laterality Date   BREAST RECONSTRUCTION     CARDIOVERSION N/A 06/27/2020   Procedure: CARDIOVERSION;  Surgeon: Jeffrie Oneil BROCKS, MD;  Location: [facility];  Service: Cardiovascular;  Laterality: N/A;   CESAREAN SECTION     COLONOSCOPY  2004   repeat in ten years   KNEE ARTHROSCOPY Left    TONSILLECTOMY     TOTAL KNEE ARTHROPLASTY Right 01/01/2014   Procedure: TOTAL RIGHT KNEE ARTHROPLASTY;  Surgeon: Dempsey LULLA Moan, MD;  Location: [facility];  Service: Orthopedics;  Laterality: Right;   TOTAL KNEE ARTHROPLASTY Left 07/23/2014   Procedure: LEFT TOTAL KNEE ARTHROPLASTY;  Surgeon: Dempsey Moan LULLA, MD;  Location: [facility];  Service: Orthopedics;  Laterality: Left;   tummy tuck  1987   WISDOM TOOTH EXTRACTION      Family History  Problem Relation Age of Onset   Heart disease Father    Hypertension Father    Coronary artery disease Father    CVA Mother     Social History   Socioeconomic History   Marital status: Married    Spouse name: Not on file   Number of children: 2   Years of education: Not on file   Highest education level: Not on file  Occupational History   Occupation: Firefighter  Tobacco Use   Smoking status: Former    Current packs/day: 0.00    Average packs/day: 0.3 packs/day for 12.0 years (3.0 ttl pk-yrs)    Types: Cigarettes    Start date: 12/28/1963    Quit date: 12/28/1975    Years since quitting: 48.6   Smokeless tobacco: Never  Vaping Use   Vaping status: Never Used  Substance and Sexual Activity   Alcohol  use: Yes    Alcohol /week: 4.0 standard drinks of alcohol     Types: 4 Glasses of wine per week    Comment: socially   Drug use: No   Sexual activity: Not on file  Other Topics Concern   Not on file  Social History Narrative   Lives in [place] with her husband. Exercises at the gym 4 times a week.   Right handed    Caffeine-none   Social Drivers of Health   Financial Resource Strain: Low Risk  (10/03/2019)   Received from [facility] visits prior to 12/26/2022.   Overall Financial Resource Strain (CARDIA)    Difficulty of Paying Living Expenses: Not hard at all  Food Insecurity: No Food Insecurity (10/03/2019)   Received from [facility] visits prior to 12/26/2022.   Hunger Vital Sign    Within the past 12 months, you worried that your food would run out before you got the money to buy more.: Never true    Within the past 12 months, the food you bought just didn't last and you didn't have money to get more.: Never true  Transportation Needs: No Transportation Needs (10/03/2019)   Received from [facility] visits prior to 12/26/2022.   PRAPARE - Administrator, Civil Service (Medical): No    Lack of Transportation (Non-Medical): No  Physical Activity: Not on file  Stress: Not on file  Social Connections: Not on file  Intimate Partner Violence: Not on file    Allergies  Allergen Reactions   Nsaids Other (See Comments)    significantly raises BP   Tolmetin Other (See Comments)    tolmetin   Bee Venom Swelling   Insect Extract     Other Reaction(s): Unknown   Other     Artificial sweeteners cause dizziness and altered mental state   Codeine Nausea Only    Current Outpatient Medications  Medication Sig Dispense Refill   acetaminophen  (TYLENOL ) 500 MG tablet Take 1,000 mg by mouth every 8 (eight) hours as needed for moderate pain or headache.     ASPERCREME LIDOCAINE  EX Apply 1 application topically daily as needed (arthritis pain).     aspirin  EC 81 MG tablet Take 1 tablet (81 mg total) by mouth daily. Swallow whole.     cetirizine (ZYRTEC) 10 MG tablet Take 10 mg by mouth daily as needed for allergies.      colchicine  0.6 MG tablet Take 0.6 mg by mouth at bedtime. For gout     COVID-19 mRNA bivalent vaccine, Pfizer,  (PFIZER COVID-19 VAC BIVALENT) injection Inject into the muscle. 0.3 mL 0   COVID-19 mRNA bivalent vaccine, Pfizer, (PFIZER COVID-19 VAC BIVALENT) injection Inject into the muscle. 0.3 mL 0   COVID-19 mRNA vaccine 2023-2024 (COMIRNATY ) syringe Inject into the muscle. 0.3 mL 0   COVID-19 mRNA vaccine, Moderna, 100 MCG/0.5ML injection Inject into the muscle. 0.25 mL 0   diltiazem  (CARDIZEM  CD) 360 MG 24 hr capsule Take 1 capsule (  360 mg total) by mouth daily. 90 capsule 1   EPINEPHrine 0.3 mg/0.3 mL IJ SOAJ injection Inject 0.3 mg into the muscle as needed for anaphylaxis (Allergic Reaction).      fluticasone (FLONASE) 50 MCG/ACT nasal spray Place 1-2 sprays into both nostrils daily as needed for allergies.      gabapentin (NEURONTIN) 300 MG capsule Take 300 mg by mouth in the morning, at noon, and at bedtime. (Patient taking differently: Take 300 mg by mouth as needed.)     levothyroxine  (SYNTHROID , LEVOTHROID) 125 MCG tablet Take 125 mcg by mouth daily before breakfast. For thyroid  therapy     rosuvastatin  (CRESTOR ) 10 MG tablet Take 1 tablet (10 mg total) by mouth daily. 90 tablet 3   RSV vaccine recomb adjuvanted (AREXVY ) 120 MCG/0.5ML injection Inject into the muscle. 0.5 mL 0   telmisartan (MICARDIS) 80 MG tablet Take 80 mg by mouth daily.     valACYclovir (VALTREX) 1000 MG tablet Take 1,000 mg by mouth daily as needed (outbreaks).      XARELTO  20 MG TABS tablet TAKE ONE TABLET DAILY WITH SUPPER 90 tablet 1   Zoster Vaccine Adjuvanted (SHINGRIX ) injection Inject into the muscle. 0.5 mL 0   Zoster Vaccine Adjuvanted (SHINGRIX ) injection Inject into the muscle. 0.5 mL 0   diltiazem  (CARDIZEM ) 30 MG tablet Takje one tablet by mouth every 4hrs as needed for heart rate > 100 (Patient not taking: Reported on 08/01/2024)     Multiple Vitamin (MULTIVITAMIN ADULT) TABS Take 1 tablet by mouth daily.     ondansetron  (ZOFRAN -ODT) 4 MG disintegrating tablet Take 4 mg by mouth 3 (three) times daily as needed  (nausea/vomiting). For N&V (Patient not taking: Reported on 08/01/2024)     No current facility-administered medications for this visit.    PHYSICAL EXAM Vitals:   08/01/24 1330  BP: 130/84  Pulse: 77  Temp: 97.9 F (36.6 C)  SpO2: 98%  Weight: 215 lb 11.2 oz (97.8 kg)  Height: 5' 3 (1.6 m)   Elderly woman in no distress Regular rate and rhythm Unlabored breathing No cranial nerve abnormalities No extremity weakness Normal gait and station  PERTINENT LABORATORY AND RADIOLOGIC DATA  Most recent CBC    Latest Ref Rng & Units 02/19/2022   12:38 PM 06/20/2020    4:06 PM 12/01/2019    3:29 PM  CBC  WBC 3.4 - 10.8 x10E3/uL 7.2  7.3  7.1   Hemoglobin 11.1 - 15.9 g/dL 86.8  86.4  86.0   Hematocrit 34.0 - 46.6 % 38.6  38.9  41.8   Platelets 150 - 450 x10E3/uL 190  180  204      Most recent CMP    Latest Ref Rng & Units 02/19/2022   12:38 PM 06/20/2020    4:06 PM 12/01/2019    3:29 PM  CMP  Glucose 70 - 99 mg/dL 898  94  92   BUN 8 - 27 mg/dL 20  28  21    Creatinine 0.57 - 1.00 mg/dL 9.01  8.77  8.85   Sodium 134 - 144 mmol/L 137  134  138   Potassium 3.5 - 5.2 mmol/L 4.0  3.7  3.3   Chloride 96 - 106 mmol/L 97  94  98   CO2 20 - 29 mmol/L 21  25  26    Calcium  8.7 - 10.3 mg/dL 9.4  9.6  9.9   Total Protein 6.0 - 8.5 g/dL   6.8   Total Bilirubin 0.0 -  1.2 mg/dL   0.6   Alkaline Phos 39 - 117 IU/L   70   AST 0 - 40 IU/L   31   ALT 0 - 32 IU/L   35     Renal function CrCl cannot be calculated (Patient's most recent lab result is older than the maximum 21 days allowed.).  No results found for: HGBA1C  LDL Chol Calc (NIH)  Date Value Ref Range Status  12/01/2019 64 0 - 99 mg/dL Final    CT scan of the abdomen pelvis.  Personally reviewed.  No sinister findings.  Small calcified splenic artery aneurysm seen.  Carotid duplex personally reviewed.  1 to 39% bilateral carotid artery stenosis seen.  Debby SAILOR. Magda, MD [facility] of  [facility] Phone Number: [phone] 08/01/2024 4:15 PM   Total time spent on preparing this encounter including chart review, data review, collecting history, examining the patient, and coordinating care: 45 min  Portions of this report may have been transcribed using voice recognition software.  Every effort has been made to ensure accuracy; however, inadvertent computerized transcription errors may still be present.

## 2024-08-02 ENCOUNTER — Ambulatory Visit (HOSPITAL_BASED_OUTPATIENT_CLINIC_OR_DEPARTMENT_OTHER): Payer: Self-pay | Admitting: Cardiology

## 2024-08-03 ENCOUNTER — Ambulatory Visit
Admission: RE | Admit: 2024-08-03 | Discharge: 2024-08-03 | Disposition: A | Discharge status: Home / Self Care | Source: Ambulatory Visit | Attending: Neurology | Admitting: Neurology

## 2024-08-03 ENCOUNTER — Encounter: Payer: Self-pay | Admitting: Radiology

## 2024-08-03 DIAGNOSIS — H34231 Retinal artery branch occlusion, right eye: Secondary | ICD-10-CM | POA: Diagnosis not present

## 2024-08-03 DIAGNOSIS — G459 Transient cerebral ischemic attack, unspecified: Secondary | ICD-10-CM | POA: Diagnosis not present

## 2024-08-03 MED ORDER — IOPAMIDOL (ISOVUE-370) INJECTION 76%
75.0000 mL | Freq: Once | INTRAVENOUS | Status: AC | PRN
  Administered 2024-08-03: 75 mL via INTRAVENOUS

## 2024-08-07 ENCOUNTER — Ambulatory Visit: Payer: Self-pay | Admitting: Neurology

## 2024-08-16 ENCOUNTER — Ambulatory Visit: Admitting: Podiatry

## 2024-08-16 ENCOUNTER — Encounter: Payer: Self-pay | Admitting: Podiatry

## 2024-08-16 ENCOUNTER — Ambulatory Visit (INDEPENDENT_AMBULATORY_CARE_PROVIDER_SITE_OTHER)

## 2024-08-16 DIAGNOSIS — M79674 Pain in right toe(s): Secondary | ICD-10-CM | POA: Diagnosis not present

## 2024-08-16 DIAGNOSIS — H34239 Retinal artery branch occlusion, unspecified eye: Secondary | ICD-10-CM

## 2024-08-16 DIAGNOSIS — I6381 Other cerebral infarction due to occlusion or stenosis of small artery: Secondary | ICD-10-CM | POA: Diagnosis not present

## 2024-08-16 DIAGNOSIS — B351 Tinea unguium: Secondary | ICD-10-CM

## 2024-08-16 DIAGNOSIS — Z7901 Long term (current) use of anticoagulants: Secondary | ICD-10-CM

## 2024-08-16 DIAGNOSIS — M79675 Pain in left toe(s): Secondary | ICD-10-CM

## 2024-08-16 DIAGNOSIS — I639 Cerebral infarction, unspecified: Secondary | ICD-10-CM | POA: Diagnosis not present

## 2024-08-16 LAB — ECHOCARDIOGRAM COMPLETE BUBBLE STUDY
AR max vel: 1.25 cm2
AV Area VTI: 1.13 cm2
AV Area mean vel: 1.19 cm2
AV Mean grad: 8 mmHg
AV Peak grad: 14.1 mmHg
Ao pk vel: 1.88 m/s
Area-P 1/2: 3.2 cm2
P 1/2 time: 298 ms
S' Lateral: 3.01 cm

## 2024-08-16 NOTE — Progress Notes (Signed)
  Subjective:  Patient ID: Elizabeth Wang, female    DOB: 01-25-1946,   MRN: [id number]  Chief Complaint  Patient presents with   rfc    RFC. not diabetic. Xarelto . 81 mg Asprin    78 y.o. female presents for follow-up of left hallux wound and routine care of the feet.  She takes xarelto  and is at risk for foot care.  Denies any other pedal complaints. Denies n/v/f/c.   Past Medical History:  Diagnosis Date   Acute pulmonary embolism (HCC) 06/2013   Aortic stenosis, mild 08/2022   ECHO 08/2022 showed mild AS. Recommend 2 yr f/u per cardiology. Asymptomatic.   Arthritis    Asthma    years ago    Chronic kidney disease    Clotting disorder    DVT/PE   Diverticulosis    minor    Dyslipidemia    Esophageal reflux    Hyperlipidemia    Hypertension    Hyponatremia    Resolved with discontinuation of HCTZ   Hypothyroidism    Numbness    TOES   Obesity    OSA (obstructive sleep apnea)    severe OSA with an AHI of 51/hr and nocturnal hypoxemia with O2 sats at low as 69%.   Paroxysmal atrial fibrillation (HCC)    1 isolated episode in the setting of acute stress with no further episodes.    Pseudogout    Secondary hypercoagulable state    Splenic artery aneurysm    Venous insufficiency (chronic) (peripheral)     Objective:  Physical Exam: Vascular: DP/PT pulses 2/4 bilateral. CFT <3 seconds. Normal hair growth on digits. No edema.  Skin. No lacerations or abrasions bilateral feet. Right third digit nail healing well lateral border. Left hallux ulcer healed  No erythema edema or purulence noted. Hyeprkeratosis noted to lateral third digit on right. Musculoskeletal: MMT 5/5 bilateral lower extremities in DF, PF, Inversion and Eversion. Deceased ROM in DF of ankle joint. Hallux malleus noted. Right second and fourth digit hammered remaining digits in more rectus position.  Neurological: Sensation intact to light touch.   Assessment:   1. Pain due to onychomycosis of toenails of  both feet   2. Chronic anticoagulation        Plan:  Patient was evaluated and treated and all questions answered. Ulcer left hallux wound -healed Second and fourth digits healing well.  -Hyperkeratotic lesion noted to lateral third digit debrided as courtesy.   -Mechanically debrided all nails 1-5 bilateral using sterile nail nipper and filed with dremel without incident  -Answered all patient questions -Patient to return  in 6 months for at risk foot care -Patient advised to call the office if any problems or questions arise in the meantime.         Return in about 6 months (around 02/14/2025), or if symptoms worsen or fail to improve, for rfc.       Return in about 6 months (around 02/14/2025), or if symptoms worsen or fail to improve, for rfc.   Asberry Failing, DPM

## 2024-08-28 ENCOUNTER — Other Ambulatory Visit: Payer: Self-pay

## 2024-09-19 DIAGNOSIS — H34231 Retinal artery branch occlusion, right eye: Secondary | ICD-10-CM | POA: Diagnosis not present

## 2024-09-29 ENCOUNTER — Other Ambulatory Visit: Payer: Self-pay | Admitting: Cardiology

## 2024-09-29 MED ORDER — DILTIAZEM HCL ER COATED BEADS 360 MG PO CP24: 360.0000 mg | ORAL_CAPSULE | Freq: Every day | ORAL | 3 refills | Status: DC

## 2024-10-12 ENCOUNTER — Encounter (HOSPITAL_BASED_OUTPATIENT_CLINIC_OR_DEPARTMENT_OTHER): Payer: Self-pay | Admitting: Cardiovascular Disease

## 2024-10-12 ENCOUNTER — Ambulatory Visit (INDEPENDENT_AMBULATORY_CARE_PROVIDER_SITE_OTHER): Admitting: Cardiovascular Disease

## 2024-10-12 DIAGNOSIS — E782 Mixed hyperlipidemia: Secondary | ICD-10-CM | POA: Diagnosis not present

## 2024-10-12 DIAGNOSIS — I10 Essential (primary) hypertension: Secondary | ICD-10-CM | POA: Diagnosis not present

## 2024-10-12 DIAGNOSIS — I4819 Other persistent atrial fibrillation: Secondary | ICD-10-CM | POA: Diagnosis not present

## 2024-10-12 LAB — LIPID PANEL
Chol/HDL Ratio: 2.5 ratio (ref 0.0–4.4)
Cholesterol, Total: 128 mg/dL (ref 100–199)
HDL: 51 mg/dL (ref >39)
LDL Chol Calc (NIH): 58 mg/dL (ref 0–99)
Triglycerides: 105 mg/dL (ref 0–149)
VLDL Cholesterol Cal: 19 mg/dL (ref 5–40)

## 2024-10-12 LAB — COMPREHENSIVE METABOLIC PANEL WITH GFR
ALT: 13 IU/L (ref 0–32)
AST: 17 IU/L (ref 0–40)
Albumin: 4 g/dL (ref 3.8–4.8)
Alkaline Phosphatase: 89 IU/L (ref 49–135)
BUN/Creatinine Ratio: 18 (ref 12–28)
BUN: 18 mg/dL (ref 8–27)
Bilirubin Total: 0.8 mg/dL (ref 0.0–1.2)
CO2: 20 mmol/L (ref 20–29)
Calcium: 9.3 mg/dL (ref 8.7–10.3)
Chloride: 101 mmol/L (ref 96–106)
Creatinine, Ser: 1.01 mg/dL — ABNORMAL HIGH (ref 0.57–1.00)
Globulin, Total: 2.8 g/dL (ref 1.5–4.5)
Glucose: 94 mg/dL (ref 70–99)
Potassium: 4.4 mmol/L (ref 3.5–5.2)
Sodium: 137 mmol/L (ref 134–144)
Total Protein: 6.8 g/dL (ref 6.0–8.5)
eGFR: 57 mL/min/1.73 — ABNORMAL LOW (ref >59)

## 2024-10-12 MED ORDER — ROSUVASTATIN CALCIUM 10 MG PO TABS: 10.0000 mg | ORAL_TABLET | Freq: Every day | ORAL | 3 refills | Status: AC

## 2024-10-12 NOTE — Patient Instructions (Signed)
 Medication Instructions:  Your physician recommends that you continue on your current medications as directed. Please refer to the Current Medication list given to you today.  *If you need a refill on your cardiac medications before your next appointment, please call your pharmacy*  Lab Work:  Your physician recommends that you return for lab work today: Lipid and CMP   Testing/Procedures: Your physician has requested that you have an echocardiogram in 12 months.  Echocardiography is a painless test that uses sound waves to create images of your heart. It provides your doctor with information about the size and shape of your heart and how well your hearts chambers and valves are working. This procedure takes approximately one hour. There are no restrictions for this procedure. Please do NOT wear cologne, perfume, aftershave, or lotions (deodorant is allowed). Please arrive 15 minutes prior to your appointment time.  Please note: We ask at that you not bring children with you during ultrasound (echo/ vascular) testing. Due to room size and safety concerns, children are not allowed in the ultrasound rooms during exams. Our front office staff cannot provide observation of children in our lobby area while testing is being conducted. An adult accompanying a patient to their appointment will only be allowed in the ultrasound room at the discretion of the ultrasound technician under special circumstances. We apologize for any inconvenience.   Follow-Up: At [facility], you and your health needs are our priority.  As part of our continuing mission to provide you with exceptional heart care, we have created designated Provider Care Teams.  These Care Teams include your primary Cardiologist (physician) and Advanced Practice Providers (APPs -  Physician Assistants and Nurse Practitioners) who all work together to provide you with the care you need, when you need it.  We recommend signing up for  the patient portal called MyChart.  Sign up information is provided on this After Visit Summary.  MyChart is used to connect with patients for Virtual Visits (Telemedicine).  Patients are able to view lab/test results, encounter notes, upcoming appointments, etc.  Non-urgent messages can be sent to your provider as well.   To learn more about what you can do with MyChart, go to [email or web address].    Your next appointment:   12 month(s)  The format for your next appointment:   In Person  Provider:   Dr. Raford

## 2024-10-12 NOTE — Progress Notes (Signed)
 Cardiology Office Note:  .   Date:  10/12/2024  ID:  Elizabeth Wang, DOB 11-Dec-1945, MRN [id number] PCP: Elizabeth Wang., MD  [facility] Providers Cardiologist:  Shelda Bruckner, MD Electrophysiologist:  OLE ONEIDA HOLTS, MD  Sleep Medicine:  Wilbert Bihari, MD    History of Present Illness: .    Elizabeth Wang is a 78 y.o. female with hypertension, PAF, retinal artery occlusion, hyperlipidemia, CKD 3a, obesity, and OSA on BiPAP here for follow up.  She was previously a patient of several partners, most recently Dr. Bruckner.  She was seen 06/2024 after a central retinal artery occlusion.  She developed acute onset of vision changes and felt like a curtain came down over her right eye.  Her eye doctor reportedly told her she had a stroke.  No records were available at the time.  She had been on Xarelto  without interruption.  She was also concerned about a splenic artery aneurysm, though this was not noted on her CT 05/2024.  She plan to follow-up with the retinal specialist and then there was discussion about the need for further stroke evaluation.  She saw Dr. Laurence at the [facility] 08/2024 and another retinal specialist 06/2024.  MRI of the brain without contrast was normal as was CTA of the neck earlier than DJD of the cervical spine and scattered atherosclerosis.  History of Present Illness [name] notes that she has been on Xarelto  for 11 years but recently realized she was not taking it with a meal, which she has since corrected. She wonders if this may be why she had the BRAO.  She feels that her brain has adapted to the vision changes, although she has not noticed significant improvement in her vision over the past year.  Her blood pressure readings at home typically range from 100s to 130s over 70s to 80s, with occasional higher readings. She has not been monitoring her blood pressure regularly in the past week and a half. She is currently taking diltiazem  and  telmisartan for blood pressure management and atrial fibrillation. Hydrochlorothiazide  was previously used but discontinued due to abnormal lab values in the summer.  She had mild AKI.  Her physical activity has been limited recently, although she has a photographer and sees a physical therapist once or twice a week. Her balance and gait have been off for a long time. She is still working full-time and feels she gets movement throughout the day but acknowledges she is not getting enough exercise.  She follows a good diet, typically eating protein in the morning, a light lunch, and lean meats or seafood for dinner. She mostly cooks at home and tries to watch her salt intake, as her husband is 'really anti salt.'  She has atrial fibrillation, which she does not feel but is aware of through her Apple Watch notifications. She feels slightly winded when walking up steps over the last three to four months, which she attributes to a lack of exercise. She has not experienced any symptoms directly related to atrial fibrillation and is only aware of it through her Apple Watch notifications.  She experienced serous drainage from a cut on her leg for six days after an injury from a broken wine glass. Her leg retains fluid, which she attributes to tissue damage from a previous bad sprain.  ROS:  As per HPI  Studies Reviewed: .       Echo 08/16/24: 1. Left ventricular ejection fraction, by  estimation, is 60 to 65%. Left  ventricular ejection fraction by 3D volume is 63 %. The left ventricle has  normal function. The left ventricle has no regional wall motion  abnormalities. There is mild concentric  left ventricular hypertrophy. Left ventricular diastolic function could  not be evaluated.   2. Right ventricular systolic function is normal. The right ventricular  size is mildly enlarged. There is mildly elevated pulmonary artery  systolic pressure. The estimated right ventricular systolic pressure is   44.5 mmHg.   3. Left atrial size was severely dilated.   4. Right atrial size was severely dilated.   5. The mitral valve is degenerative. Mild to moderate mitral valve  regurgitation. No evidence of mitral stenosis. The mean mitral valve  gradient is 2.1 mmHg with average heart rate of 77 bpm. Severe mitral  annular calcification.   6. The tricuspid valve is abnormal. Tricuspid valve regurgitation is  moderate to severe.   7. There is paradoxical low flow low gradient aortic stenosis. The aortic  valve is tricuspid. There is moderate calcification of the aortic valve.  There is moderate thickening of the aortic valve. Aortic valve  regurgitation is not visualized. Mild to  moderate aortic valve stenosis.   8. The inferior vena cava is dilated in size with >50% respiratory  variability, suggesting right atrial pressure of 8 mmHg.   Risk Assessment/Calculations:    CHA2DS2-VASc Score = 7   This indicates a 11.2% annual risk of stroke. The patient's score is based upon: CHF History: 0 HTN History: 1 Diabetes History: 0 Stroke History: 2 Vascular Disease History: 1 Age Score: 2 Gender Score: 1            Physical Exam:   VS:  BP 132/74   Pulse 99   Ht 7' (2.134 m)   Wt 221 lb 12.8 oz (100.6 kg)   LMP  (LMP Unknown)   SpO2 99%   BMI 22.10 kg/m  , BMI Body mass index is 22.1 kg/m. GENERAL:  Well appearing HEENT: Pupils equal round and reactive, fundi not visualized, oral mucosa unremarkable NECK:  No jugular venous distention, waveform within normal limits, carotid upstroke brisk and symmetric, no bruits, no thyromegaly LUNGS:  Clear to auscultation bilaterally HEART:  Irregularly irregular.  PMI not displaced or sustained,S1 and S2 within normal limits, no S3, no S4, no clicks, no rubs, II/VI systolic  murmur at the LUSB ABD:  Flat, positive bowel sounds normal in frequency in pitch, no bruits, no rebound, no guarding, no midline pulsatile mass, no hepatomegaly, no  splenomegaly EXT:  2 plus pulses throughout, trace bilateral LE edema, no cyanosis no clubbing.  L ankle disfigured from prior injury SKIN:  No rashes no nodules NEURO:  Cranial nerves II through XII grossly intact, motor grossly intact throughout PSYCH:  Cognitively intact, oriented to person place and time   ASSESSMENT AND PLAN: .    Assessment & Plan # Branch retinal artery occlusion, right eye Branch retinal artery occlusion likely due to atherosclerotic disease or embolic event. MRI showed no complete occlusion. Cholesterol levels controlled. Xarelto  taken with meals for absorption. Cause uncertain, but protection from embolic and atherosclerotic events ensured. - Continue Xarelto  with meals. - Monitor cholesterol levels.  # Persistent atrial fibrillation Asymptomatic. Heart rate controlled. Echocardiogram normal. No recent symptomatic episodes. Apple Watch detects irregular heartbeats without symptoms. - Continue diltiazem  and telmisartan. - Continue Xarelto   # Primary hypertension Blood pressure ranges from 100s to 130s/70s to 80s with  occasional higher readings. Managed with diltiazem  and telmisartan. Hydrochlorothiazide  discontinued due to kidney and potassium concerns. Lifestyle modifications recommended. - Encouraged regular exercise, including walking in the pool. - Continue current antihypertensive regimen.  # Mixed hyperlipidemia Cholesterol levels last checked in August 2024. Current levels unknown. Diet focuses on lean proteins and low salt. - Ordered lipid panel to assess current cholesterol levels. - Continue rosuvastatin .  # Aortic stenosis:  Mild-moderate.  Repeat echo in 1 year.    Dispo: f/u 1 year.  Signed, Annabella Scarce, MD

## 2024-10-24 ENCOUNTER — Ambulatory Visit: Payer: Self-pay | Admitting: Cardiovascular Disease

## 2024-12-13 ENCOUNTER — Ambulatory Visit: Admitting: Podiatry

## 2025-01-29 ENCOUNTER — Ambulatory Visit: Admitting: Podiatry
